# Patient Record
Sex: Female | Born: 1945 | Race: White | Hispanic: No | Smoking: Former smoker
Health system: Southern US, Community
[De-identification: ages and names within clinical notes are randomized; demographics above are authoritative.]

## PROBLEM LIST (undated history)

## (undated) DIAGNOSIS — M1990 Unspecified osteoarthritis, unspecified site: Secondary | ICD-10-CM

## (undated) DIAGNOSIS — K219 Gastro-esophageal reflux disease without esophagitis: Secondary | ICD-10-CM

## (undated) DIAGNOSIS — G500 Trigeminal neuralgia: Secondary | ICD-10-CM

## (undated) DIAGNOSIS — H269 Unspecified cataract: Secondary | ICD-10-CM

## (undated) DIAGNOSIS — I10 Essential (primary) hypertension: Secondary | ICD-10-CM

## (undated) DIAGNOSIS — H409 Unspecified glaucoma: Secondary | ICD-10-CM

## (undated) DIAGNOSIS — G709 Myoneural disorder, unspecified: Secondary | ICD-10-CM

## (undated) DIAGNOSIS — G35 Multiple sclerosis: Secondary | ICD-10-CM

## (undated) DIAGNOSIS — E785 Hyperlipidemia, unspecified: Secondary | ICD-10-CM

## (undated) DIAGNOSIS — Z789 Other specified health status: Secondary | ICD-10-CM

## (undated) DIAGNOSIS — C801 Malignant (primary) neoplasm, unspecified: Secondary | ICD-10-CM

## (undated) HISTORY — DX: Other specified health status: Z78.9

## (undated) HISTORY — PX: OTHER SURGICAL HISTORY: SHX169

## (undated) HISTORY — DX: Gastro-esophageal reflux disease without esophagitis: K21.9

## (undated) HISTORY — DX: Multiple sclerosis: G35

## (undated) HISTORY — PX: BREAST LUMPECTOMY: SHX2

## (undated) HISTORY — DX: Myoneural disorder, unspecified: G70.9

## (undated) HISTORY — DX: Unspecified osteoarthritis, unspecified site: M19.90

## (undated) HISTORY — DX: Malignant (primary) neoplasm, unspecified: C80.1

## (undated) HISTORY — PX: POLYPECTOMY: SHX149

## (undated) HISTORY — PX: PARTIAL HYSTERECTOMY: SHX80

## (undated) HISTORY — DX: Essential (primary) hypertension: I10

## (undated) HISTORY — DX: Hyperlipidemia, unspecified: E78.5

## (undated) HISTORY — DX: Unspecified glaucoma: H40.9

## (undated) HISTORY — DX: Unspecified cataract: H26.9

---

## 2000-01-26 ENCOUNTER — Encounter: Payer: Self-pay | Admitting: Emergency Medicine

## 2000-01-26 ENCOUNTER — Emergency Department (HOSPITAL_COMMUNITY): Admission: EM | Admit: 2000-01-26 | Discharge: 2000-01-27 | Payer: Self-pay | Admitting: *Deleted

## 2000-04-04 ENCOUNTER — Ambulatory Visit (HOSPITAL_COMMUNITY): Admission: RE | Admit: 2000-04-04 | Discharge: 2000-04-04 | Payer: Self-pay | Admitting: Neurology

## 2000-10-27 ENCOUNTER — Encounter: Admission: RE | Admit: 2000-10-27 | Discharge: 2000-11-04 | Payer: Self-pay | Admitting: Psychiatry

## 2003-11-04 ENCOUNTER — Encounter: Admission: RE | Admit: 2003-11-04 | Discharge: 2003-11-04 | Payer: Self-pay | Admitting: Family Medicine

## 2010-04-07 ENCOUNTER — Encounter: Payer: Self-pay | Admitting: Family Medicine

## 2010-12-20 HISTORY — PX: COLONOSCOPY: SHX174

## 2013-09-12 DIAGNOSIS — G35 Multiple sclerosis: Secondary | ICD-10-CM | POA: Insufficient documentation

## 2013-09-12 DIAGNOSIS — M810 Age-related osteoporosis without current pathological fracture: Secondary | ICD-10-CM | POA: Insufficient documentation

## 2013-09-12 DIAGNOSIS — M79609 Pain in unspecified limb: Secondary | ICD-10-CM | POA: Insufficient documentation

## 2013-09-12 DIAGNOSIS — F419 Anxiety disorder, unspecified: Secondary | ICD-10-CM | POA: Insufficient documentation

## 2013-09-12 DIAGNOSIS — M545 Low back pain, unspecified: Secondary | ICD-10-CM | POA: Insufficient documentation

## 2013-09-12 DIAGNOSIS — E871 Hypo-osmolality and hyponatremia: Secondary | ICD-10-CM | POA: Insufficient documentation

## 2013-09-12 DIAGNOSIS — G500 Trigeminal neuralgia: Secondary | ICD-10-CM | POA: Insufficient documentation

## 2013-09-12 DIAGNOSIS — D539 Nutritional anemia, unspecified: Secondary | ICD-10-CM

## 2013-09-12 DIAGNOSIS — E7800 Pure hypercholesterolemia, unspecified: Secondary | ICD-10-CM | POA: Insufficient documentation

## 2013-09-12 DIAGNOSIS — R51 Headache: Secondary | ICD-10-CM

## 2013-09-12 DIAGNOSIS — M26629 Arthralgia of temporomandibular joint, unspecified side: Secondary | ICD-10-CM | POA: Insufficient documentation

## 2013-09-12 DIAGNOSIS — R261 Paralytic gait: Secondary | ICD-10-CM | POA: Insufficient documentation

## 2013-09-12 DIAGNOSIS — M25519 Pain in unspecified shoulder: Secondary | ICD-10-CM | POA: Insufficient documentation

## 2013-09-12 DIAGNOSIS — R519 Headache, unspecified: Secondary | ICD-10-CM | POA: Insufficient documentation

## 2013-09-12 DIAGNOSIS — G4700 Insomnia, unspecified: Secondary | ICD-10-CM | POA: Insufficient documentation

## 2013-09-12 DIAGNOSIS — E559 Vitamin D deficiency, unspecified: Secondary | ICD-10-CM | POA: Insufficient documentation

## 2013-09-12 DIAGNOSIS — D759 Disease of blood and blood-forming organs, unspecified: Secondary | ICD-10-CM | POA: Insufficient documentation

## 2013-09-12 DIAGNOSIS — R5383 Other fatigue: Secondary | ICD-10-CM | POA: Insufficient documentation

## 2013-09-12 DIAGNOSIS — B0222 Postherpetic trigeminal neuralgia: Secondary | ICD-10-CM | POA: Insufficient documentation

## 2013-09-12 HISTORY — DX: Nutritional anemia, unspecified: D53.9

## 2013-12-13 ENCOUNTER — Ambulatory Visit (INDEPENDENT_AMBULATORY_CARE_PROVIDER_SITE_OTHER): Payer: Medicare Other | Admitting: Podiatry

## 2013-12-13 ENCOUNTER — Encounter: Payer: Self-pay | Admitting: Podiatry

## 2013-12-13 VITALS — BP 143/74 | HR 69 | Ht 63.0 in | Wt 146.0 lb

## 2013-12-13 DIAGNOSIS — L600 Ingrowing nail: Secondary | ICD-10-CM | POA: Insufficient documentation

## 2013-12-13 DIAGNOSIS — M79604 Pain in right leg: Secondary | ICD-10-CM

## 2013-12-13 DIAGNOSIS — M79609 Pain in unspecified limb: Secondary | ICD-10-CM

## 2013-12-13 DIAGNOSIS — B351 Tinea unguium: Secondary | ICD-10-CM | POA: Insufficient documentation

## 2013-12-13 DIAGNOSIS — M79606 Pain in leg, unspecified: Secondary | ICD-10-CM | POA: Insufficient documentation

## 2013-12-13 NOTE — Patient Instructions (Signed)
Seen for painful nail. Noted of ingrown nail on right great toe.  Debrided painful nail. Return in 3 months.

## 2013-12-13 NOTE — Progress Notes (Signed)
Subjective: 68 year old female presents complaining of painful nail while wearing closed in shoes. Stated that right toe nail came off and grew back ingrown now.  Has had ingrown nail surgery years back on both great toe nails.   Review of Systems - General ROS: negative  Objective: Neurovascular status are within normal. Ingrown nail both great toes R>L. Mild bunion L>R.  Assessment: Painful ingrown nail R>L.  Plan: Reviewed clinical findings  Debrided all nails. Return as needed.

## 2014-03-16 ENCOUNTER — Ambulatory Visit: Payer: Medicare Other | Admitting: Podiatry

## 2014-04-14 ENCOUNTER — Ambulatory Visit: Payer: Self-pay | Admitting: Neurology

## 2014-05-16 ENCOUNTER — Encounter: Payer: Self-pay | Admitting: Neurology

## 2014-05-16 ENCOUNTER — Ambulatory Visit (INDEPENDENT_AMBULATORY_CARE_PROVIDER_SITE_OTHER): Payer: Medicare Other | Admitting: Neurology

## 2014-05-16 VITALS — BP 148/94 | HR 68 | Resp 14 | Ht 63.0 in | Wt 146.6 lb

## 2014-05-16 DIAGNOSIS — R5383 Other fatigue: Secondary | ICD-10-CM | POA: Diagnosis not present

## 2014-05-16 DIAGNOSIS — R261 Paralytic gait: Secondary | ICD-10-CM | POA: Diagnosis not present

## 2014-05-16 DIAGNOSIS — F418 Other specified anxiety disorders: Secondary | ICD-10-CM | POA: Diagnosis not present

## 2014-05-16 DIAGNOSIS — G500 Trigeminal neuralgia: Secondary | ICD-10-CM | POA: Insufficient documentation

## 2014-05-16 DIAGNOSIS — G35 Multiple sclerosis: Secondary | ICD-10-CM | POA: Diagnosis not present

## 2014-05-16 DIAGNOSIS — G35D Multiple sclerosis, unspecified: Secondary | ICD-10-CM

## 2014-05-16 MED ORDER — CARBAMAZEPINE 200 MG PO TABS: 200.0000 mg | ORAL_TABLET | Freq: Four times a day (QID) | ORAL | Status: DC

## 2014-05-16 MED ORDER — OXYCODONE-ACETAMINOPHEN 10-325 MG PO TABS: ORAL_TABLET | ORAL | Status: DC

## 2014-05-16 MED ORDER — LAMOTRIGINE 100 MG PO TABS: 100.0000 mg | ORAL_TABLET | Freq: Three times a day (TID) | ORAL | Status: DC

## 2014-05-16 MED ORDER — HYDROCODONE-ACETAMINOPHEN 5-325 MG PO TABS: 1.0000 | ORAL_TABLET | Freq: Four times a day (QID) | ORAL | Status: DC | PRN

## 2014-05-16 MED ORDER — INTERFERON BETA-1A 22 MCG/0.5ML ~~LOC~~ SOSY: 22.0000 ug | PREFILLED_SYRINGE | SUBCUTANEOUS | Status: DC

## 2014-05-16 NOTE — Progress Notes (Signed)
[facility]  PATIENT: Crystal Huber DOB: Nov 25, 1945  REFERRING DOCTOR OR PCP:  [place]: Patient  _________________________________   HISTORICAL  CHIEF COMPLAINT:  Chief Complaint  Patient presents with  . Multiple Sclerosis    Sts. she tolerates Rebif well.  Denies new or worsening sx.   . Trigeminal Neuralgia    Sts. left sided facial pain well controlled with Tegretol qid, Lamictal qhs/fim    HISTORY OF PRESENT ILLNESS:   Crystal Huber is a 69 year old woman who was diagnosed with multiple sclerosis in 2002 after presenting with left trigeminal neuralgia. She was initially referred to Dr. Jacolyn Reedy and had MRI and LP consistent with the diagnosis of MS.   She then saw her one of the doctors at [facility] neurology and [facility] She was started on Rebif. For second opinion she went to Dr. Jacqulynn Cadet in [place] and continue to see him for several years. A few years ago, after he moved , she started to see me in [facility]. She is now transferring care to [facility].   She is not sure when her last MRI was but believes a couple years ago.       Her main symptoms with MS continues to be left trigeminal neuralgia.   She is on Carbamezapine (3-4 times a day), Lamictal (bid) and baclofen with benefit.   She occasional uses hydrocodone and very rarely used oxycodone (a few times a year).       A couple times a year, she will have several days where she is having much more pain. She denies any numbness but has some allodynia in the left face. The distribution is in the V2 neurotome.  She denies any numbness or weakness in her legs. Sometimes she will feel a little unsteady, especially when she gets out of bed at night. She has not fallen recently.  She notes a little bit of bladder urgency at times and will have rare stress incontinence. She has not noted hesitancy. She denies any recent urinary tract infections.  She denies any visual  problems.  She notes fatigue that often worsens as the day goes on. This is both physical and cognitive. She denies any sleepiness. She falls asleep easily and we'll wake up usually twice a night to use the bathroom. However, she quickly falls back asleep.   She notes some more stress recently with her husband's illness. She denies any significant depression or anxiety here she tolerates citalopram well. She will have a mild or anxiety.   She notes mild cognitive dysfunction at times. Specifically she has noted some verbal fluency issues and also has mild short-term memory issues at times. She finds it harder to concentrate for longer periods at times and notes that she may not remember early part of the movie by the time she is watching the second half of the movie.  REVIEW OF SYSTEMS: Constitutional: No fevers, chills, sweats, or change in appetite.   Fatigue Eyes: No visual changes, double vision, eye pain Ear, nose and throat: No hearing loss, ear pain, nasal congestion, sore throat Cardiovascular: No chest pain, palpitations Respiratory: No shortness of breath at rest or with exertion.   No wheezes GastrointestinaI: No nausea, vomiting, diarrhea, abdominal pain, fecal incontinence Genitourinary: No dysuria, urinary retention or frequency.  No nocturia. Musculoskeletal: No neck pain, back pain Integumentary: No rash, pruritus, skin lesions Neurological: as above Psychiatric: No depression.  Mild anxiety Endocrine: No palpitations, diaphoresis, change in appetite, change in  weigh or increased thirst Hematologic/Lymphatic: No anemia, purpura, petechiae..   Bruises easily Allergic/Immunologic: No itchy/runny eyes, nasal congestion, recent allergic reactions, rashes  ALLERGIES: Allergies  Allergen Reactions  . Metronidazole Nausea Only  . Penicillins Swelling    Yeast infection  . Sulfa Antibiotics Swelling    yeast infection    HOME MEDICATIONS:  Current outpatient  prescriptions:  .  baclofen (LIORESAL) 10 MG tablet, Take 10 mg by mouth., Disp: , Rfl:  .  calcium-vitamin D (CALCIUM 500/D) 500-200 MG-UNIT per tablet, Take by mouth., Disp: , Rfl:  .  carbamazepine (TEGRETOL) 200 MG tablet, Take 200 mg by mouth., Disp: , Rfl:  .  citalopram (CELEXA) 20 MG tablet, Take 1 tablet daily, Disp: , Rfl:  .  HYDROcodone-acetaminophen (NORCO/VICODIN) 5-325 MG per tablet, Take by mouth., Disp: , Rfl:  .  hydrocortisone 2.5 % cream, Apply topically., Disp: , Rfl:  .  interferon beta-1a (REBIF) 22 MCG/0.5ML injection, , Disp: , Rfl:  .  lamoTRIgine (LAMICTAL) 100 MG tablet, , Disp: , Rfl:  .  meloxicam (MOBIC) 15 MG tablet, Take 15 mg by mouth., Disp: , Rfl:  .  olmesartan (BENICAR) 20 MG tablet, Take 20 mg by mouth., Disp: , Rfl:  .  vitamin B-12 (CYANOCOBALAMIN) 100 MCG tablet, Place under the tongue., Disp: , Rfl:   PAST MEDICAL HISTORY: Past Medical History  Diagnosis Date  . Multiple sclerosis   . Hypertension   . Glaucoma     PAST SURGICAL HISTORY: Past Surgical History  Procedure Laterality Date  . Partial hysterectomy      FAMILY HISTORY: Family History  Problem Relation Age of Onset  . Congestive Heart Failure Mother   . Heart attack Father   . Stroke Sister   . Heart disease Brother   . Heart disease Sister   . Heart disease Brother   . Heart disease Brother     SOCIAL HISTORY:  History   Social History  . Marital Status: Married    Spouse Name: N/A  . Number of Children: N/A  . Years of Education: N/A   Occupational History  . Not on file.   Social History Main Topics  . Smoking status: Former Research scientist (life sciences)  . Smokeless tobacco: Never Used  . Alcohol Use: 0.0 oz/week    0 Standard drinks or equivalent per week     Comment: 1-2 glasses of wine at night/fim  . Drug Use: No  . Sexual Activity: Not on file   Other Topics Concern  . Not on file   Social History Narrative     PHYSICAL EXAM  Filed Vitals:   05/16/14 0902   BP: 148/94  Pulse: 68  Resp: 14  Height: 5\' 3"  (1.6 m)  Weight: 146 lb 9.6 oz (66.497 kg)    Body mass index is 25.98 kg/(m^2).   General: The patient is well-developed and well-nourished and in no acute distress  Eyes:  Funduscopic exam shows normal optic discs and retinal vessels.  Neck: The neck is supple, no carotid bruits are noted.  The neck is nontender.  Cardiovascular: The heart has a regular rate and rhythm with a normal S1 and S2. There were no murmurs, gallops or rubs. Lungs are clear to auscultation.  Skin: Extremities are without significant edema.  Musculoskeletal:  Back is nontender  Neurologic Exam  Mental status: The patient is alert and oriented x 3 at the time of the examination. The patient has apparent normal recent and remote memory, with an  apparently normal attention span and concentration ability.   Speech is normal.  Cranial nerves: Extraocular movements are full. Pupils are equal, round, and reactive to light and accomodation.  Visual fields are full.  Colors are brighter out of left eye than right.  Facial symmetry is present. There is good facial sensation to soft touch bilaterally.Facial strength is normal.  Trapezius and sternocleidomastoid strength is normal. No dysarthria is noted.  The tongue is midline, and the patient has symmetric elevation of the soft palate. No obvious hearing deficits are noted.  Motor:  Muscle bulk is normal.   Tone is normal. Strength is  5 / 5 in all 4 extremities.   Sensory: Sensory testing is intact to pinprick, soft touch and vibration sensation in all 4 extremities.  Coordination: Cerebellar testing reveals good finger-nose-finger and heel-to-shin bilaterally.  Gait and station: Station is normal.   Gait is normal. Tandem gait is wide. Romberg is negative.   Reflexes: Deep tendon reflexes are symmetric and normal bilaterally.   Plantar responses are flexor.    DIAGNOSTIC DATA (LABS, IMAGING, TESTING) - I  reviewed patient records, labs, notes, testing and imaging myself where available.    ASSESSMENT AND PLAN  Multiple sclerosis - Plan: MR Brain W Wo Contrast, CBC with Differential/Platelet, CMP, Carbamazepine level, total  Trigeminal neuralgia - Plan: MR Brain W Wo Contrast, CBC with Differential/Platelet, CMP, Carbamazepine level, total  Other fatigue  Other specified anxiety disorders  Jerking gait    In summary, Penda Venturi is a 69 year old woman with relapsing remitting multiple sclerosis who has been stable on Rebif. Her current  Problems include left trigeminal neuralgia, mild gait dysfunction and fatigue. I will check blood work to make sure she is not experiencing any  hematologic or hepatic side effect of her therapy and I will also check a Tegretol level. In order to assess for possible subclinical progression of her MS and we will obtain an MRI of the brain with and without contrast. She requested an open magnet. If she does have progression we will need to consider some of the other therapies and I reviewed some of the other options.   She will return to see me in 6 months or sooner if she has new or worsening neurologic issues.        45 minute face-to-face evaluation with greater than 50% of the time counseling or coordinating care about her neurologic symptoms and multiple sclerosis.   Aldred Mase A. Felecia Shelling, MD, PhD 8/65/7846, 9:62 AM Certified in Neurology, Clinical Neurophysiology, Sleep Medicine, Pain Medicine and Neuroimaging  [facility] [address], [place], [place] [address] [phone]

## 2014-05-16 NOTE — Addendum Note (Signed)
Addended by: Arlice Colt A on: 05/16/2014 10:14 AM   Modules accepted: Orders

## 2014-05-16 NOTE — Patient Instructions (Signed)

## 2014-05-17 ENCOUNTER — Telehealth: Payer: Self-pay | Admitting: *Deleted

## 2014-05-17 ENCOUNTER — Telehealth: Payer: Self-pay | Admitting: Neurology

## 2014-05-17 LAB — CBC WITH DIFFERENTIAL/PLATELET
BASOS ABS: 0 10*3/uL (ref 0.0–0.2)
Basos: 1 %
EOS ABS: 0 10*3/uL (ref 0.0–0.4)
Eos: 1 %
HCT: 40.8 % (ref 34.0–46.6)
HEMOGLOBIN: 13.3 g/dL (ref 11.1–15.9)
IMMATURE GRANS (ABS): 0 10*3/uL (ref 0.0–0.1)
Immature Granulocytes: 0 %
Lymphocytes Absolute: 0.9 10*3/uL (ref 0.7–3.1)
Lymphs: 22 %
MCH: 32.9 pg (ref 26.6–33.0)
MCHC: 32.6 g/dL (ref 31.5–35.7)
MCV: 101 fL — ABNORMAL HIGH (ref 79–97)
MONOCYTES: 16 %
Monocytes Absolute: 0.7 10*3/uL (ref 0.1–0.9)
NEUTROS ABS: 2.5 10*3/uL (ref 1.4–7.0)
NEUTROS PCT: 60 %
PLATELETS: 268 10*3/uL (ref 150–379)
RBC: 4.04 x10E6/uL (ref 3.77–5.28)
RDW: 13.1 % (ref 12.3–15.4)
WBC: 4.2 10*3/uL (ref 3.4–10.8)

## 2014-05-17 LAB — COMPREHENSIVE METABOLIC PANEL
A/G RATIO: 1.7 (ref 1.1–2.5)
ALK PHOS: 111 IU/L (ref 39–117)
ALT: 19 IU/L (ref 0–32)
AST: 23 IU/L (ref 0–40)
Albumin: 4.2 g/dL (ref 3.6–4.8)
BUN/Creatinine Ratio: 30 — ABNORMAL HIGH (ref 11–26)
BUN: 17 mg/dL (ref 8–27)
Bilirubin Total: 0.2 mg/dL (ref 0.0–1.2)
CO2: 25 mmol/L (ref 18–29)
Calcium: 9.1 mg/dL (ref 8.7–10.3)
Chloride: 92 mmol/L — ABNORMAL LOW (ref 97–108)
Creatinine, Ser: 0.57 mg/dL (ref 0.57–1.00)
GFR calc non Af Amer: 96 mL/min/{1.73_m2} (ref >59)
GFR, EST AFRICAN AMERICAN: 110 mL/min/{1.73_m2} (ref >59)
Globulin, Total: 2.5 g/dL (ref 1.5–4.5)
Glucose: 96 mg/dL (ref 65–99)
POTASSIUM: 5.4 mmol/L — AB (ref 3.5–5.2)
Sodium: 133 mmol/L — ABNORMAL LOW (ref 134–144)
Total Protein: 6.7 g/dL (ref 6.0–8.5)

## 2014-05-17 LAB — CARBAMAZEPINE LEVEL, TOTAL: Carbamazepine Lvl: 9.1 ug/mL (ref 4.0–12.0)

## 2014-05-17 NOTE — Telephone Encounter (Signed)
LMOM (identified vm) that per RAS, labs are ok, so continue current dose of Tegretol, and call me if she has any questions/fim

## 2014-05-17 NOTE — Telephone Encounter (Signed)
Patient has questions regarding procedure for Trigeminal Neuralgia.  Please call and advise.

## 2014-05-17 NOTE — Telephone Encounter (Signed)
-----   Message from [place]. Felecia Shelling, MD sent at 05/17/2014  4:25 PM EST ----- Labs are ok    Tegretol level is good so continue curent dose

## 2014-05-18 NOTE — Telephone Encounter (Signed)
Spoke with Crystal Huber--she has questions about a procedure for trigeminal neuralgia that she discussed with RAS at last ov.  I will ask RAS about this and call her back/fim

## 2014-05-18 NOTE — Telephone Encounter (Signed)
Spoke with Crystal Huber and advised I have spoken with Crystal Huber--the procedure he discussed with her was a sphenocath.  I described procedure to Crystal Huber and answered her questions.  She verbalized understanding of same/fim

## 2014-05-24 DIAGNOSIS — G35 Multiple sclerosis: Secondary | ICD-10-CM | POA: Diagnosis not present

## 2014-05-25 ENCOUNTER — Other Ambulatory Visit: Payer: Self-pay | Admitting: Neurology

## 2014-05-25 DIAGNOSIS — G500 Trigeminal neuralgia: Secondary | ICD-10-CM

## 2014-05-25 DIAGNOSIS — G35 Multiple sclerosis: Secondary | ICD-10-CM

## 2014-05-31 ENCOUNTER — Telehealth: Payer: Self-pay | Admitting: *Deleted

## 2014-05-31 NOTE — Telephone Encounter (Signed)
-----   Message from Britt Bottom, MD sent at 05/30/2014  5:41 PM EDT ----- I compared side by side to 2012 MRI  --- only 1 new spot in > 3 years is pretty good

## 2014-05-31 NOTE — Telephone Encounter (Signed)
Spoke with Crystal Huber and per RAS, advised that recent mri shows only one new spot since 2012; she should continue meds as rx'd.  She verbalized understanding of same/fim

## 2014-07-11 ENCOUNTER — Telehealth: Payer: Self-pay | Admitting: Neurology

## 2014-07-11 NOTE — Telephone Encounter (Signed)
Called pt to schedule her husband for a consult and she said she needs to talk to you regarding her Trigeminal Neuralgia.

## 2014-07-14 ENCOUNTER — Encounter: Payer: Self-pay | Admitting: Neurology

## 2014-07-14 ENCOUNTER — Ambulatory Visit (INDEPENDENT_AMBULATORY_CARE_PROVIDER_SITE_OTHER): Payer: Medicare Other | Admitting: Neurology

## 2014-07-14 VITALS — BP 166/102 | HR 64 | Resp 14 | Ht 63.0 in | Wt 141.4 lb

## 2014-07-14 DIAGNOSIS — G35 Multiple sclerosis: Secondary | ICD-10-CM

## 2014-07-14 DIAGNOSIS — F413 Other mixed anxiety disorders: Secondary | ICD-10-CM | POA: Diagnosis not present

## 2014-07-14 DIAGNOSIS — G500 Trigeminal neuralgia: Secondary | ICD-10-CM | POA: Diagnosis not present

## 2014-07-14 DIAGNOSIS — R350 Frequency of micturition: Secondary | ICD-10-CM | POA: Diagnosis not present

## 2014-07-14 DIAGNOSIS — G44099 Other trigeminal autonomic cephalgias (TAC), not intractable: Secondary | ICD-10-CM

## 2014-07-14 DIAGNOSIS — R5383 Other fatigue: Secondary | ICD-10-CM | POA: Diagnosis not present

## 2014-07-14 MED ORDER — LAMOTRIGINE 150 MG PO TABS: 150.0000 mg | ORAL_TABLET | Freq: Three times a day (TID) | ORAL | Status: DC

## 2014-07-14 MED ORDER — TAMSULOSIN HCL 0.4 MG PO CAPS: 0.4000 mg | ORAL_CAPSULE | Freq: Every day | ORAL | Status: DC

## 2014-07-14 NOTE — Progress Notes (Signed)
[facility]  PATIENT: Crystal Huber DOB: 01/10/1946  REFERRING DOCTOR OR PCP:  [place]: Patient  _________________________________   HISTORICAL  CHIEF COMPLAINT:  Chief Complaint  Patient presents with  . Multiple Sclerosis    Sts. she tolerates Rebif well.  Sts. she had frequent episodes of dizziness--episodes are daily right now, lasting from minutes to hrs. Sts. pcp has told her this is due to inner ear problems, and rx'd Meclizine, which she isn't sure helps.  She is also having more difficulty starting urine stream./fim  . Trigeminal Neuralgia    Sts. left sided facial pain is worse over the last 3 weeks.  She is taking Hydrocodone, Carbamazepine, and Lamictal more often than rx'd--every 3-4 hrs./fim    HISTORY OF PRESENT ILLNESS:  Crystal Huber is a 69 year old woman who was diagnosed with multiple sclerosis in 2002 after presenting with left trigeminal neuralgia.   Her main symptoms with MS continues to be left trigeminal neuralgia.   Pain intensified the last 3 weeks.   She is taking 4-5  200 mg tegretols and 4-5 100 mg Lamictal and baclofen 10 mg nightly (too sleepy if taken during the day).   She takes hydrocodone 5/325 twice a day.   The distribution is in the V2 neurotome. Since going up on her medications, she has felt more dizzy.  Meclizine does not help the dizziness.  Gait/Strength/sensation:   She denies any numbness or weakness in her legs. Sometimes she will feel a little unsteady, especially when she gets out of bed at night. She has not fallen recently.  Bladder:  She notes a little bit of bladder urgency at times and will have rare stress incontinence. She has not noted hesitancy. She denies any recent urinary tract infections.  Vision:   She denies any visual problems.  Fatigue:  She notes fatigue that often worsens as the day goes on. This is both physical and cognitive. She falls asleep easily and will wake up usually twice a  night to use the bathroom. However, she quickly falls back asleep.   Mood/cogniion:   She notes some anxiety and stress recently. She denies any significant depression or anxiety here she tolerates citalopram well. She will have a mild or anxiety. She notes mild cognitive dysfunction and has noted some verbal fluency issues and also has mild short-term memory issues at times.   MS History:   She had MRI and LP consistent with the diagnosis of MS.   She was started on Rebif. For second opinion she went to Dr. Jacqulynn Cadet in [place] and continue to see him for several years. A few years ago, after he moved , she started to see me in [facility]. She is now transferring care to [facility].   She is on Rebif 22 mcg, tolerates it well and has had no definite exacerbation.  Last MRi was 2-3 years ago.  REVIEW OF SYSTEMS: Constitutional: No fevers, chills, sweats, or change in appetite.   Fatigue Eyes: No visual changes, double vision, eye pain Ear, nose and throat: No hearing loss, ear pain, nasal congestion, sore throat Cardiovascular: No chest pain, palpitations Respiratory: No shortness of breath at rest or with exertion.   No wheezes GastrointestinaI: No nausea, vomiting, diarrhea, abdominal pain, fecal incontinence Genitourinary: No dysuria, urinary retention or frequency.  No nocturia. Musculoskeletal: No neck pain, back pain Integumentary: No rash, pruritus, skin lesions Neurological: as above Psychiatric: Some depression and  Mild anxiety Endocrine: No palpitations, diaphoresis,  change in appetite, change in weigh or increased thirst Hematologic/Lymphatic: No anemia, purpura, petechiae..   Bruises easily Allergic/Immunologic: No itchy/runny eyes, nasal congestion, recent allergic reactions, rashes  ALLERGIES: Allergies  Allergen Reactions  . Metronidazole Nausea Only  . Penicillins Swelling    Yeast infection  . Sulfa Antibiotics Swelling    yeast infection    HOME  MEDICATIONS:  Current outpatient prescriptions:  .  baclofen (LIORESAL) 10 MG tablet, Take 10 mg by mouth., Disp: , Rfl:  .  calcium-vitamin D (CALCIUM 500/D) 500-200 MG-UNIT per tablet, Take by mouth., Disp: , Rfl:  .  carbamazepine (TEGRETOL) 200 MG tablet, Take 1 tablet (200 mg total) by mouth 4 (four) times daily., Disp: 120 tablet, Rfl: 12 .  citalopram (CELEXA) 20 MG tablet, Take 1 tablet daily, Disp: , Rfl:  .  HYDROcodone-acetaminophen (NORCO/VICODIN) 5-325 MG per tablet, Take 1 tablet by mouth every 6 (six) hours as needed for moderate pain., Disp: 30 tablet, Rfl: 0 .  hydrocortisone 2.5 % cream, Apply topically., Disp: , Rfl:  .  Interferon Beta-1a (REBIF) 22 MCG/0.5ML SOSY, Inject 22 mcg into the skin 3 (three) times a week., Disp: 12 Syringe, Rfl: 12 .  lamoTRIgine (LAMICTAL) 100 MG tablet, Take 1 tablet (100 mg total) by mouth 3 (three) times daily., Disp: 90 tablet, Rfl: 12 .  meclizine (ANTIVERT) 12.5 MG tablet, Take 12.5 mg by mouth., Disp: , Rfl:  .  meloxicam (MOBIC) 15 MG tablet, Take 15 mg by mouth., Disp: , Rfl:  .  olmesartan (BENICAR) 20 MG tablet, Take 20 mg by mouth., Disp: , Rfl:  .  oxyCODONE-acetaminophen (PERCOCET) 10-325 MG per tablet, One po q 8 hours as needed, Disp: 15 tablet, Rfl: 0 .  vitamin B-12 (CYANOCOBALAMIN) 100 MCG tablet, Place under the tongue., Disp: , Rfl:   PAST MEDICAL HISTORY: Past Medical History  Diagnosis Date  . Multiple sclerosis   . Hypertension   . Glaucoma     PAST SURGICAL HISTORY: Past Surgical History  Procedure Laterality Date  . Partial hysterectomy      FAMILY HISTORY: Family History  Problem Relation Age of Onset  . Congestive Heart Failure Mother   . Heart attack Father   . Stroke Sister   . Heart disease Brother   . Heart disease Sister   . Heart disease Brother   . Heart disease Brother     SOCIAL HISTORY:  History   Social History  . Marital Status: Married    Spouse Name: N/A  . Number of Children:  N/A  . Years of Education: N/A   Occupational History  . Not on file.   Social History Main Topics  . Smoking status: Former Research scientist (life sciences)  . Smokeless tobacco: Never Used  . Alcohol Use: 0.0 oz/week    0 Standard drinks or equivalent per week     Comment: 1-2 glasses of wine at night/fim  . Drug Use: No  . Sexual Activity: Not on file   Other Topics Concern  . Not on file   Social History Narrative     PHYSICAL EXAM  Filed Vitals:   07/14/14 1030  BP: 166/102  Pulse: 64  Resp: 14  Height: 5\' 3"  (1.6 m)  Weight: 141 lb 6.4 oz (64.139 kg)    Body mass index is 25.05 kg/(m^2).   General: The patient is well-developed and well-nourished and in no acute distress  Neck: The neck is supple.  The neck is nontender.   Neurologic Exam  Mental status: The patient is alert and oriented x 3 at the time of the examination. The patient has apparent normal recent and remote memory, with an apparently normal attention span and concentration ability.   Speech is normal.  Cranial nerves: Extraocular movements are full.   Facial symmetry is present. There is good facial sensation to soft touch bilaterally.Facial strength is normal.  Trapezius and sternocleidomastoid strength is normal. No dysarthria is noted.  The tongue is midline, and the patient has symmetric elevation of the soft palate. No obvious hearing deficits are noted.  Motor:  Muscle bulk is normal.   Tone is normal. Strength is  5 / 5 in all 4 extremities.   Sensory: Sensory testing is intact to touch.  Coordination: Cerebellar testing reveals good finger-nose-finger .  Gait and station: Station is normal.   Gait is normal. Tandem gait is wide.    Reflexes: Deep tendon reflexes are symmetric and normal bilaterally.       DIAGNOSTIC DATA (LABS, IMAGING, TESTING) - I reviewed patient records, labs, notes, testing and imaging myself where available.   PROCEDURE    SPHENOCATH PROCEDURE NOTE  History: Trigeminal  Neuralgia  Procedure: The patient was placed in the supine position. A temperature strip was added to the cheek area after the area was cleaned with alcohol. The Sphenocath was lubricated with gel, and placed in the right naris. The catheter was inserted above the middle turbinate to the posterior nasal cavity, and then withdrawn 1 cm. The catheter was deployed and rotated approximately 20 towards the nose. 2-1/2 mL of 2% lidocaine was deployed. The patient was asked to swallow during the injection. The patient demonstrated erythema of the sclera of the eye on this side, and an increase in the cheek temperature was noted from 95 F to 97 F.  This process was repeated on the left side, with similar results. The increase in cheek temperature was documented from 95 F to 57 F.  The patient tolerated the procedure well. No complications of the procedure were noted. The patient was kept in the supine position for several minutes following the procedure. She was given small sips of water after sitting up following the procedure.  Lidocaine 2% NDC [phone]     ASSESSMENT AND PLAN  Trigeminal neuralgia  Other trigeminal autonomic cephalgia (TAC)  Multiple sclerosis  Urinary frequency  Other fatigue  Other mixed anxiety disorders   1.    Change lamotrigine from 100 to 150 mg po tid 2.    Continue tegretol but at tid in general, inc to qid if more pain 3.   Sphenocath procedure for her worsening trigeminal neuralgia - she tolerated it well and felt pain as better.    4.   Call on Monday to let us know if benefit was sustained.    Consider repeat procedure next week.  rtc 3 months, sooner if problems  Kesa Birky A. Felecia Shelling, MD, PhD 6/76/7209, 47:09 AM Certified in Neurology, Clinical Neurophysiology, Sleep Medicine, Pain Medicine and Neuroimaging  [facility] [address] [place], [place] [address] [phone]   11S

## 2014-07-16 ENCOUNTER — Telehealth: Payer: Self-pay | Admitting: Neurology

## 2014-07-16 NOTE — Telephone Encounter (Signed)
Pt called that she had sphenocath procedure on 07/14/14 and it helped that day and part of the second day. Since Friday pm, the pain returned and she has to take tegretol 200mg  Q4h qid and lamictal 150mg  Q4 qid. With these dosing, her pain is controlled and no dizziness so far. But she will call [name] Monday to see if she needs a walk-in visit.   I warned her that high dose tegretol can cause dizziness and she has to watch for it. Also her lamictal dose is increased rather fast so she has to watch for the rashes. She has low risk for rash though as she has been on lamictal for 2 years. She expressed understanding and appreciation. She will call in on Monday to speak with [name].  Rosalin Hawking, MD PhD Stroke Neurology 07/16/2014 10:24 AM

## 2014-07-17 NOTE — Telephone Encounter (Signed)
She got 2 days benefit from sphenocath -- we can try once more on Monday afternoon if she wants

## 2014-07-18 ENCOUNTER — Telehealth: Payer: Self-pay | Admitting: Neurology

## 2014-07-18 NOTE — Telephone Encounter (Signed)
I spoke with Crystal Huber and with Crystal Huber--they wanted to know what Crystal Huber can take for anxiety.  I reminded them that since he has not tolerated any meds for anxiety so far, that RAS increased his Citalopram to help with anxiety.  Crystal Huber verbalized understanding of same, will continue with Citalopram/fim

## 2014-07-18 NOTE — Telephone Encounter (Signed)
I spoke with Crystal Huber--gave appt. for sphenocath tomorrow at 1p/fim

## 2014-07-18 NOTE — Telephone Encounter (Signed)
Patient called and stated that [name] wanted her to call and give an update on her and her husbands medication changes. Her husband Crystal Huber started the medication last Tuesdays. Stated that she would give you more information when she speaks with you. Please call and advise.

## 2014-07-19 ENCOUNTER — Encounter: Payer: Self-pay | Admitting: Neurology

## 2014-07-19 ENCOUNTER — Ambulatory Visit (INDEPENDENT_AMBULATORY_CARE_PROVIDER_SITE_OTHER): Payer: Medicare Other | Admitting: Neurology

## 2014-07-19 VITALS — BP 164/96 | HR 78 | Resp 16 | Ht 63.0 in | Wt 141.0 lb

## 2014-07-19 DIAGNOSIS — G500 Trigeminal neuralgia: Secondary | ICD-10-CM | POA: Diagnosis not present

## 2014-07-19 NOTE — Progress Notes (Signed)
[facility]  PATIENT: Crystal Huber DOB: Jun 07, 1945  REFERRING DOCTOR OR PCP:  [place]: Patient  _________________________________   HISTORICAL  CHIEF COMPLAINT:  Chief Complaint  Patient presents with  . Trigeminal Neuralgia    Sts. she got 2 days of relief from sphenocath last week--would like to repeat./fim    HISTORY OF PRESENT ILLNESS:  Crystal Huber is a 69 year old woman who was diagnosed with multiple sclerosis in 2002 after presenting with left trigeminal neuralgia.   Her main issue is  left trigeminal neuralgia.      She is taking 4-5  200 mg tegretols and 4-5 100 mg Lamictal and baclofen 10 mg nightly (too sleepy if taken during the day).   She takes hydrocodone 5/325 twice a day.   The distribution is in the V2 neurotome. Since going up on her medications, she has felt more dizzy.  Meclizine does not help the dizziness.  Sphenocath last week helped tremendously x 2 days, then pain returned back to the same level.     ALLERGIES: Allergies  Allergen Reactions  . Metronidazole Nausea Only  . Penicillins Swelling    Yeast infection  . Sulfa Antibiotics Swelling    yeast infection    HOME MEDICATIONS:  Current outpatient prescriptions:  .  baclofen (LIORESAL) 10 MG tablet, Take 10 mg by mouth., Disp: , Rfl:  .  calcium-vitamin D (CALCIUM 500/D) 500-200 MG-UNIT per tablet, Take by mouth., Disp: , Rfl:  .  carbamazepine (TEGRETOL) 200 MG tablet, Take 1 tablet (200 mg total) by mouth 4 (four) times daily., Disp: 120 tablet, Rfl: 12 .  citalopram (CELEXA) 20 MG tablet, Take 1 tablet daily, Disp: , Rfl:  .  HYDROcodone-acetaminophen (NORCO/VICODIN) 5-325 MG per tablet, Take 1 tablet by mouth every 6 (six) hours as needed for moderate pain., Disp: 30 tablet, Rfl: 0 .  hydrocortisone 2.5 % cream, Apply topically., Disp: , Rfl:  .  Interferon Beta-1a (REBIF) 22 MCG/0.5ML SOSY, Inject 22 mcg into the skin 3 (three) times a week., Disp: 12  Syringe, Rfl: 12 .  lamoTRIgine (LAMICTAL) 150 MG tablet, Take 1 tablet (150 mg total) by mouth 3 (three) times daily., Disp: 90 tablet, Rfl: 11 .  meclizine (ANTIVERT) 12.5 MG tablet, Take 12.5 mg by mouth., Disp: , Rfl:  .  meloxicam (MOBIC) 15 MG tablet, Take 15 mg by mouth., Disp: , Rfl:  .  olmesartan (BENICAR) 20 MG tablet, Take 20 mg by mouth., Disp: , Rfl:  .  oxyCODONE-acetaminophen (PERCOCET) 10-325 MG per tablet, One po q 8 hours as needed, Disp: 15 tablet, Rfl: 0 .  tamsulosin (FLOMAX) 0.4 MG CAPS capsule, Take 1 capsule (0.4 mg total) by mouth daily., Disp: 30 capsule, Rfl: 11 .  vitamin B-12 (CYANOCOBALAMIN) 100 MCG tablet, Place under the tongue., Disp: , Rfl:   PAST MEDICAL HISTORY: Past Medical History  Diagnosis Date  . Multiple sclerosis   . Hypertension   . Glaucoma     PAST SURGICAL HISTORY: Past Surgical History  Procedure Laterality Date  . Partial hysterectomy      FAMILY HISTORY: Family History  Problem Relation Age of Onset  . Congestive Heart Failure Mother   . Heart attack Father   . Stroke Sister   . Heart disease Brother   . Heart disease Sister   . Heart disease Brother   . Heart disease Brother     SOCIAL HISTORY:  History   Social History  . Marital Status: Married  Spouse Name: N/A  . Number of Children: N/A  . Years of Education: N/A   Occupational History  . Not on file.   Social History Main Topics  . Smoking status: Former Research scientist (life sciences)  . Smokeless tobacco: Never Used  . Alcohol Use: 0.0 oz/week    0 Standard drinks or equivalent per week     Comment: 1-2 glasses of wine at night/fim  . Drug Use: No  . Sexual Activity: Not on file   Other Topics Concern  . Not on file   Social History Narrative     PHYSICAL EXAM  Filed Vitals:   07/19/14 1324  BP: 164/96  Pulse: 78  Resp: 16  Height: 5\' 3"  (1.6 m)  Weight: 141 lb (63.957 kg)    Body mass index is 24.98 kg/(m^2).   General: The patient is well-developed and  well-nourished and in no acute distress  Neck: The neck is supple.  The neck is nontender.  Neurologic Exam  Mental status: The patient is alert and oriented x 3 at the time of the examination. The patient has apparent normal recent and remote memory, with an apparently normal attention span and concentration ability.   Speech is normal.  Cranial nerves: Extraocular movements are full.   Facial symmetry is present. There is good facial sensation to soft touch bilaterally.Facial strength is normal.  Trapezius and sternocleidomastoid strength is normal. No dysarthria is noted.    Gait and station: Station is normal.   Gait is normal. Tandem gait is wide.      PROCEDURE    SPHENOCATH PROCEDURE NOTE  History: Trigeminal Neuralgia  Procedure: The patient was placed in the supine position. A temperature strip was added to the left cheek area after the area was cleaned with alcohol. The Sphenocath was lubricated with gel, and placed in the left naris. The catheter was inserted above the middle turbinate to the posterior nasal cavity, and then withdrawn 1 cm. The catheter was deployed and rotated approximately 20 towards the nose. 2-1/2 mL of 2% lidocaine was deployed. The patient was asked to swallow during the injection. The patient demonstrated erythema of the sclera of the eye on this side, and an increase in the cheek temperature was noted from 94 F to 96 F.  The patient tolerated the procedure well. No complications of the procedure were noted. The patient was kept in the supine position for several minutes following the procedure. She was given small sips of tea after sitting up following the procedure.  Lidocaine 2% NDC [phone]     ASSESSMENT AND PLAN  Trigeminal neuralgia    1.    Sphenocath procedure for her trigeminal neuralgia - she tolerated it well and felt pain as better.    2.    Continue tegretol and lamotrigine 3.   Call next Monday to let us know if benefit was  sustained.    If TN pain returns, consider referral to Pain Management for trigeminal neuralgia injwctions rtc 3 months, sooner if problems  Richard A. Felecia Shelling, MD, PhD 11/17/1192, 1:74 PM Certified in Neurology, Clinical Neurophysiology, Sleep Medicine, Pain Medicine and Neuroimaging  [facility] [address] [place], [place] [address] [phone]

## 2014-08-02 ENCOUNTER — Telehealth: Payer: Self-pay | Admitting: Neurology

## 2014-08-02 NOTE — Telephone Encounter (Signed)
Patient called requesting to speak with Crystal Huber regarding her experiencing dizziness for now reason and she would also like to discuss TMJ procedures. Please call and advise. Patient can be reached @ [phone]

## 2014-08-02 NOTE — Telephone Encounter (Signed)
Patient returned call. Please call and advise.  °

## 2014-08-02 NOTE — Telephone Encounter (Signed)
I have spoken with Crystal Huber this afternoon.  Per RAS, I advised that dizziness may be due to Tegretol, and recommended that she decrease from 4 to 3 tabs daily.  She verbalized understanding of same.  She sts. she has an appt. with Dr. Laurance Flatten at [facility] tomorrow as well to investigate another cause for intermittent dizziness.  She expresses concern that TN will be worse with decrease in Tegretol.  I have reinforced that RAS offered referral to Dr. Maryruth Eve for procedure to numb the tmj.  [name] verbalized understanding of same/fim

## 2014-08-02 NOTE — Telephone Encounter (Signed)
LMTC./fim 

## 2014-08-18 ENCOUNTER — Telehealth: Payer: Self-pay | Admitting: Neurology

## 2014-08-18 NOTE — Telephone Encounter (Signed)
Patient called requesting a refill for Interferon Beta-1a (REBIF) 22 MCG/0.5ML SOSY to [facility]. Patient can be reached at [phone].

## 2014-08-19 NOTE — Telephone Encounter (Signed)
It appears a 1 year Rx for this drug was sent to [facility] in Feb.  I called the pharmacy to clarify.  Spoke with April.  She reviewed file and verified the patient has several refills on file.  They think perhaps she was trying to refill an old Rx number.  I called the patient back.  She is aware.

## 2014-10-12 ENCOUNTER — Encounter: Payer: Self-pay | Admitting: Neurology

## 2014-10-12 ENCOUNTER — Ambulatory Visit (INDEPENDENT_AMBULATORY_CARE_PROVIDER_SITE_OTHER): Payer: Medicare Other | Admitting: Neurology

## 2014-10-12 VITALS — BP 160/80 | HR 72 | Resp 16 | Ht 63.0 in | Wt 139.0 lb

## 2014-10-12 DIAGNOSIS — R350 Frequency of micturition: Secondary | ICD-10-CM

## 2014-10-12 DIAGNOSIS — R5383 Other fatigue: Secondary | ICD-10-CM

## 2014-10-12 DIAGNOSIS — G500 Trigeminal neuralgia: Secondary | ICD-10-CM | POA: Diagnosis not present

## 2014-10-12 DIAGNOSIS — G35 Multiple sclerosis: Secondary | ICD-10-CM

## 2014-10-12 DIAGNOSIS — F419 Anxiety disorder, unspecified: Secondary | ICD-10-CM | POA: Diagnosis not present

## 2014-10-12 MED ORDER — INTERFERON BETA-1A 22 MCG/0.5ML ~~LOC~~ SOSY: 22.0000 ug | PREFILLED_SYRINGE | SUBCUTANEOUS | Status: DC

## 2014-10-12 NOTE — Progress Notes (Signed)
[facility]  PATIENT: Crystal Huber DOB: March 12, 1946  REFERRING DOCTOR OR PCP:  [place]: Patient  _________________________________   HISTORICAL  CHIEF COMPLAINT:  Chief Complaint  Patient presents with  . Multiple Sclerosis    Sts. she continues to tolerate Rebif well.  She needs a r/f of Rebif.  Sts. facial pain is stable.  Sts. dizziness has resolved/fim  . Trigeminal Neuralgia    HISTORY OF PRESENT ILLNESS:  Crystal Huber is a 69 year old woman who was diagnosed with multiple sclerosis in 2002 after presenting with left trigeminal neuralgia.   Trigeminal Neuralgia:  This is doing better currently after the Sphenocath procedure at her last visit and optimizing her medications.  She is taking 3 x  200 mg tegretols and 3 x 150 mg Lamictal and baclofen 10 mg nightly (too sleepy if taken during the day).  She is doing better than she had in years with the pain and is not dizzy on the lower dose of carbamazapine.     She is now not needing hydrocodone 5/325 (was twice a day).   The distribution is in the V2 neurotome.   Gait/Strength/sensation:  Gait is stable.   Sometimes she will feel a little unsteady, especially when she gets out of bed at night. She has not fallen recently.  She denies any numbness or weakness in her legs.   Bladder:  She notes a little bit of bladder urgency at times and will have rare stress incontinence. She has some hesitancy but decided against tamsulosin.  . She denies any recent urinary tract infections.  Vision:   She denies any visual problems.  Fatigue:  She notes some fatigue that often worsens as the day goes on.  This is better since reducing Tegretol.   She occasionally takes an energy drink. This is both physical and cognitive. She falls asleep easily and will wake up usually twice a night to use the bathroom. Her husband has Parkinson's and she needs to help him in/out of bed when he wakes up.  She quickly falls back  asleep.   Mood/cogniion:   She notes some anxiety and stress recently with husband's medical issues. She denies any significant depression .  Main problem is anxiety.   She thinks citalopram helps slightly. She notes mild cognitive dysfunction and has noted some verbal fluency issues and also has mild short-term memory issues at times.   MS History:   She had MRI and LP consistent with the diagnosis of MS in 2002 with trigeminal neuralgia.   She was started on Rebif.   She is on Rebif 22 mcg, tolerates it well and has had no definite exacerbation.  Last MRI was 2-3 years ago.  REVIEW OF SYSTEMS: Constitutional: No fevers, chills, sweats, or change in appetite.   Fatigue Eyes: No visual changes, double vision, eye pain Ear, nose and throat: No hearing loss, ear pain, nasal congestion, sore throat Cardiovascular: No chest pain, palpitations Respiratory: No shortness of breath at rest or with exertion.   No wheezes GastrointestinaI: No nausea, vomiting, diarrhea, abdominal pain, fecal incontinence Genitourinary: No dysuria, urinary retention or frequency.  No nocturia. Musculoskeletal: No neck pain, back pain Integumentary: No rash, pruritus, skin lesions Neurological: as above Psychiatric: Some depression and  Mild anxiety Endocrine: No palpitations, diaphoresis, change in appetite, change in weigh or increased thirst Hematologic/Lymphatic: No anemia, purpura, petechiae..   Bruises easily Allergic/Immunologic: No itchy/runny eyes, nasal congestion, recent allergic reactions, rashes  ALLERGIES: Allergies  Allergen Reactions  . Metronidazole Nausea Only  . Penicillins Swelling    Yeast infection  . Sulfa Antibiotics Swelling    yeast infection    HOME MEDICATIONS:  Current outpatient prescriptions:  .  baclofen (LIORESAL) 10 MG tablet, Take 10 mg by mouth., Disp: , Rfl:  .  calcium-vitamin D (CALCIUM 500/D) 500-200 MG-UNIT per tablet, Take by mouth., Disp: , Rfl:  .   carbamazepine (TEGRETOL) 200 MG tablet, Take 1 tablet (200 mg total) by mouth 4 (four) times daily., Disp: 120 tablet, Rfl: 12 .  citalopram (CELEXA) 20 MG tablet, Take 1 tablet daily, Disp: , Rfl:  .  hydrocortisone 2.5 % cream, Apply topically., Disp: , Rfl:  .  Interferon Beta-1a (REBIF) 22 MCG/0.5ML SOSY, Inject 22 mcg into the skin 3 (three) times a week., Disp: 12 Syringe, Rfl: 12 .  lamoTRIgine (LAMICTAL) 150 MG tablet, Take 1 tablet (150 mg total) by mouth 3 (three) times daily., Disp: 90 tablet, Rfl: 11 .  olmesartan (BENICAR) 20 MG tablet, Take 20 mg by mouth., Disp: , Rfl:  .  vitamin B-12 (CYANOCOBALAMIN) 100 MCG tablet, Place under the tongue., Disp: , Rfl:  .  HYDROcodone-acetaminophen (NORCO/VICODIN) 5-325 MG per tablet, Take 1 tablet by mouth every 6 (six) hours as needed for moderate pain. (Patient not taking: Reported on 10/12/2014), Disp: 30 tablet, Rfl: 0 .  meclizine (ANTIVERT) 12.5 MG tablet, Take 12.5 mg by mouth., Disp: , Rfl:  .  meloxicam (MOBIC) 15 MG tablet, Take 15 mg by mouth., Disp: , Rfl:  .  oxyCODONE-acetaminophen (PERCOCET) 10-325 MG per tablet, One po q 8 hours as needed (Patient not taking: Reported on 10/12/2014), Disp: 15 tablet, Rfl: 0 .  tamsulosin (FLOMAX) 0.4 MG CAPS capsule, Take 1 capsule (0.4 mg total) by mouth daily. (Patient not taking: Reported on 10/12/2014), Disp: 30 capsule, Rfl: 11  PAST MEDICAL HISTORY: Past Medical History  Diagnosis Date  . Multiple sclerosis   . Hypertension   . Glaucoma     PAST SURGICAL HISTORY: Past Surgical History  Procedure Laterality Date  . Partial hysterectomy      FAMILY HISTORY: Family History  Problem Relation Age of Onset  . Congestive Heart Failure Mother   . Heart attack Father   . Stroke Sister   . Heart disease Brother   . Heart disease Sister   . Heart disease Brother   . Heart disease Brother     SOCIAL HISTORY:  History   Social History  . Marital Status: Married    Spouse Name: N/A   . Number of Children: N/A  . Years of Education: N/A   Occupational History  . Not on file.   Social History Main Topics  . Smoking status: Former Research scientist (life sciences)  . Smokeless tobacco: Never Used  . Alcohol Use: 0.0 oz/week    0 Standard drinks or equivalent per week     Comment: 1-2 glasses of wine at night/fim  . Drug Use: No  . Sexual Activity: Not on file   Other Topics Concern  . Not on file   Social History Narrative     PHYSICAL EXAM  Filed Vitals:   10/12/14 1049  BP: 160/80  Pulse: 72  Resp: 16  Height: 5\' 3"  (1.6 m)  Weight: 139 lb (63.05 kg)    Body mass index is 24.63 kg/(m^2).   General: The patient is well-developed and well-nourished and in no acute distress  Neck: The neck is supple.  The neck is  nontender.   Neurologic Exam  Mental status: The patient is alert and oriented x 3 at the time of the examination. The patient has apparent normal recent and remote memory, with an apparently normal attention span and concentration ability.   Speech is normal.  Cranial nerves: Extraocular movements are full.   Facial symmetry is present. There is good facial sensation to soft touch bilaterally.Facial strength is normal.  Trapezius and sternocleidomastoid strength is normal. No dysarthria is noted.  The tongue is midline, and the patient has symmetric elevation of the soft palate. No obvious hearing deficits are noted.  Motor:  Muscle bulk is normal.   Tone is normal. Strength is  5 / 5 in all 4 extremities.   Sensory: Sensory testing is intact to touch and vibration  Coordination: Cerebellar testing reveals good finger-nose-finger .  Gait and station: Station is normal.   Gait is normal. Tandem gait is wide.    Reflexes: Deep tendon reflexes are symmetric and normal bilaterally.       DIAGNOSTIC DATA (LABS, IMAGING, TESTING) - I reviewed patient records, labs, notes, testing and imaging myself where available.      ASSESSMENT AND PLAN  Multiple  sclerosis  Trigeminal neuralgia  Urinary frequency  Anxiety disorder, unspecified anxiety disorder type  Other fatigue    1.    Continue lamotrigine 150 mg po tid and Tegretol 200 mg po tid 2.    Continue other medications. 3.   Continue rebif for MS.    4.  Try to stay active and exercise as tolerated. rtc 4 months, sooner if problems  Richard A. Felecia Shelling, MD, PhD 0/34/9179, 15:05 AM Certified in Neurology, Clinical Neurophysiology, Sleep Medicine, Pain Medicine and Neuroimaging  [facility] [address], [place], [place] [address] [phone]   11S

## 2014-10-13 ENCOUNTER — Ambulatory Visit: Payer: Self-pay | Admitting: Neurology

## 2014-10-25 ENCOUNTER — Telehealth: Payer: Self-pay | Admitting: *Deleted

## 2014-10-25 ENCOUNTER — Other Ambulatory Visit: Payer: Self-pay

## 2014-10-25 MED ORDER — CARBAMAZEPINE 200 MG PO TABS: 200.0000 mg | ORAL_TABLET | Freq: Three times a day (TID) | ORAL | Status: DC

## 2014-10-25 MED ORDER — HYDROCODONE-ACETAMINOPHEN 5-325 MG PO TABS: 1.0000 | ORAL_TABLET | Freq: Four times a day (QID) | ORAL | Status: DC | PRN

## 2014-10-25 MED ORDER — LAMOTRIGINE 150 MG PO TABS: 150.0000 mg | ORAL_TABLET | Freq: Three times a day (TID) | ORAL | Status: DC

## 2014-10-25 NOTE — Telephone Encounter (Signed)
I have spoken with Crystal Huber this afternoon--she sts. she is in [place], has been frequently waking up at 0230 with increased facial pain.  Sts. she then takes a Hydrocodone 5/325, which helps after awhile.  Per RAS ok, I have advised she take the Hydrocodone at 10pm (hs) and see if this helps more.  She is agreeable with this plan.  If this does not work, per RAS, next option would be to take one extra lamictal if she wakes up with pain/fim

## 2014-10-25 NOTE — Telephone Encounter (Signed)
Pt said she just spoke to you, she told you she needs refill on Oxycodone 5mg , she does NOT take that, she actually needs HYDROcodone-acetaminophen (NORCO/VICODIN) 5-325 MG per tablet

## 2014-10-25 NOTE — Telephone Encounter (Signed)
Rx request has been entered, forwarded to provider for approval.

## 2014-10-26 ENCOUNTER — Encounter: Payer: Self-pay | Admitting: *Deleted

## 2014-10-26 NOTE — Progress Notes (Signed)
Hydrocodone rx. up front [facility]/fim 

## 2014-10-28 NOTE — Telephone Encounter (Signed)
Patient called, she is "still having breakthrough's"

## 2014-10-28 NOTE — Telephone Encounter (Signed)
I called back and spoke with the patient.  Per Faiths note below, patient can try one Lamictal if she wakes up with pain.  Patient expressed understanding and was agreeable to trying this.  She will call back if anything further is needed.

## 2014-11-02 ENCOUNTER — Telehealth: Payer: Self-pay | Admitting: Neurology

## 2014-11-02 NOTE — Telephone Encounter (Signed)
Patient called requesting refill on HYDROcodone-acetaminophen (NORCO/VICODIN) 5-325 MG per tablet. She will have friend Aldona Lento pick it up as patient is currently out of town in [place]. Lovena Neighbours is going to mail it to her. Call patient if there is a problem and if you don't get her, leave a message. Patient said to tell you that she is really doing good.

## 2014-11-02 NOTE — Telephone Encounter (Signed)
Rx. placed up front 10-26-14.  It is ok for Lavonna to pick rx. up this time--in the future, she will need to be added to [employer] in order to pick up rx's/fim

## 2014-11-15 ENCOUNTER — Ambulatory Visit: Payer: Self-pay | Admitting: Neurology

## 2015-03-22 ENCOUNTER — Telehealth: Payer: Self-pay | Admitting: Neurology

## 2015-03-22 NOTE — Telephone Encounter (Signed)
Patient called to advise that she mailed insurance form (done every 6 months, to prove she is still disabled).

## 2015-03-24 NOTE — Telephone Encounter (Signed)
Tried to reach Crystal Huber--no answer and vm not set up.  I have her supplemental report of disability ready--as she has completed her portion, I will mail it back to [employer] and also mail a copy to her for her records/fim

## 2015-04-14 ENCOUNTER — Encounter: Payer: Self-pay | Admitting: Neurology

## 2015-04-14 ENCOUNTER — Ambulatory Visit (INDEPENDENT_AMBULATORY_CARE_PROVIDER_SITE_OTHER): Payer: Medicare Other | Admitting: Neurology

## 2015-04-14 VITALS — BP 136/86 | HR 62 | Resp 16 | Ht 63.0 in | Wt 139.0 lb

## 2015-04-14 DIAGNOSIS — F413 Other mixed anxiety disorders: Secondary | ICD-10-CM

## 2015-04-14 DIAGNOSIS — G35 Multiple sclerosis: Secondary | ICD-10-CM

## 2015-04-14 DIAGNOSIS — R5383 Other fatigue: Secondary | ICD-10-CM | POA: Diagnosis not present

## 2015-04-14 DIAGNOSIS — G500 Trigeminal neuralgia: Secondary | ICD-10-CM | POA: Diagnosis not present

## 2015-04-14 DIAGNOSIS — R261 Paralytic gait: Secondary | ICD-10-CM | POA: Diagnosis not present

## 2015-04-14 DIAGNOSIS — R350 Frequency of micturition: Secondary | ICD-10-CM

## 2015-04-14 MED ORDER — BACLOFEN 10 MG PO TABS: 10.0000 mg | ORAL_TABLET | Freq: Three times a day (TID) | ORAL | Status: DC

## 2015-04-14 NOTE — Patient Instructions (Addendum)
Take 4000 or 5000 Units of Vitamin D daily.  Continue medications  Try to stay active and exercise as tolerated.   Get a good night's rest

## 2015-04-14 NOTE — Progress Notes (Signed)
[facility]  PATIENT: Crystal Huber DOB: 1945-08-14  REFERRING DOCTOR OR PCP:  [place]: Patient  _________________________________   HISTORICAL  CHIEF COMPLAINT:  Chief Complaint  Patient presents with  . Multiple Sclerosis    Sts. she continues to tolerate Rebif well.  Sts. trigeminal neuralgia is stable as well.  Her husband passed away in December/fim    HISTORY OF PRESENT ILLNESS:  Crystal Huber is a 70 year old woman who was diagnosed with multiple sclerosis in 2002 who has had a lot of difficulty with trigeminal neuralgia.     Her husband, Crystal Huber, died in 03/06/2023.   He had parkinsonism.    She is on Rebif.   She uses the Rebiject II and prefers not to change to the new autoinjector.   She tolerates Rebif well and she has not had recent exacerbation.    However, she has had more dizziness lately.   She notes some lightheadedness after standing up.   However, the last 2 weeks have been better as she feels she is sleeping better.     Trigeminal Neuralgia:  This is doing better the [past few months.     When it was severe, the Sphenocath procedure helped.  She is taking 3 x  200 mg tegretols and 3 x 150 mg Lamictal and baclofen 10 mg nightly (too sleepy if taken during the day).  Higher tegretol dose caused dizziness.    The distribution is in the V2 neurotome.   Gait/Strength/sensation:  Gait is stable.   Sometimes she will feel a little unsteady, especially when she gets out of bed at night. She has not Huber recently.  She denies any numbness or weakness in her legs.   Bladder:  She notes a little bit of bladder urgency at times and will have rare stress incontinence. She has some hesitancy but decided against tamsulosin.  . She denies any recent urinary tract infections.  Vision:   She denies any visual problems.  Fatigue:  She notes some fatigue that often worsens as the day goes on.  This is better since reducing Tegretol.   She occasionally  takes an energy drink. This is both physical and cognitive. She falls asleep easily and will wake up usually twice a night to use the bathroom.   She quickly falls back asleep.   Mood/cognition:   She has more stress with husband dying last month.   She feels she is handling his death well.     She denies any significant depression and he anxiety is doing well.  She has some benefit from citalopram and tolerates it well.   She notes mild cognitive dysfunction and has noted some verbal fluency issues and also has mild short-term memory issues at times.   MS History:   She had MRI and LP consistent with the diagnosis of MS in 2002 with trigeminal neuralgia.   She was started on Rebif.   She is on Rebif 22 mcg, tolerates it well and has had no definite exacerbation.  Last MRI was 2-3 years ago.    Her last MRI was 05/25/14 and showed no acute findings.    REVIEW OF SYSTEMS: Constitutional: No fevers, chills, sweats, or change in appetite.   Fatigue Eyes: No visual changes, double vision, eye pain Ear, nose and throat: No hearing loss, ear pain, nasal congestion, sore throat Cardiovascular: No chest pain, palpitations Respiratory: No shortness of breath at rest or with exertion.   No wheezes GastrointestinaI: No  nausea, vomiting, diarrhea, abdominal pain, fecal incontinence Genitourinary: No dysuria, urinary retention or frequency.  No nocturia. Musculoskeletal: No neck pain, back pain Integumentary: No rash, pruritus, skin lesions Neurological: as above Psychiatric: Some depression and  Mild anxiety Endocrine: No palpitations, diaphoresis, change in appetite, change in weigh or increased thirst Hematologic/Lymphatic: No anemia, purpura, petechiae..   Bruises easily Allergic/Immunologic: No itchy/runny eyes, nasal congestion, recent allergic reactions, rashes  ALLERGIES: Allergies  Allergen Reactions  . Metronidazole Nausea Only  . Penicillins Swelling    Yeast infection  . Sulfa  Antibiotics Swelling    yeast infection    HOME MEDICATIONS:  Current outpatient prescriptions:  .  baclofen (LIORESAL) 10 MG tablet, Take 10 mg by mouth., Disp: , Rfl:  .  calcium-vitamin D (CALCIUM 500/D) 500-200 MG-UNIT per tablet, Take by mouth., Disp: , Rfl:  .  carbamazepine (TEGRETOL) 200 MG tablet, Take 1 tablet (200 mg total) by mouth 3 (three) times daily., Disp: 90 tablet, Rfl: 6 .  citalopram (CELEXA) 20 MG tablet, Take 1 tablet daily, Disp: , Rfl:  .  HYDROcodone-acetaminophen (NORCO/VICODIN) 5-325 MG per tablet, Take 1 tablet by mouth every 6 (six) hours as needed for moderate pain., Disp: 30 tablet, Rfl: 0 .  hydrocortisone 2.5 % cream, Apply topically., Disp: , Rfl:  .  Interferon Beta-1a (REBIF) 22 MCG/0.5ML SOSY, Inject 22 mcg into the skin 3 (three) times a week., Disp: 12 Syringe, Rfl: 12 .  lamoTRIgine (LAMICTAL) 150 MG tablet, Take 1 tablet (150 mg total) by mouth 3 (three) times daily., Disp: 90 tablet, Rfl: 6 .  olmesartan (BENICAR) 20 MG tablet, Take 20 mg by mouth., Disp: , Rfl:  .  oxyCODONE-acetaminophen (PERCOCET) 10-325 MG per tablet, One po q 8 hours as needed, Disp: 15 tablet, Rfl: 0 .  tamsulosin (FLOMAX) 0.4 MG CAPS capsule, Take 1 capsule (0.4 mg total) by mouth daily., Disp: 30 capsule, Rfl: 11 .  meclizine (ANTIVERT) 12.5 MG tablet, Take 12.5 mg by mouth. Reported on 04/14/2015, Disp: , Rfl:  .  vitamin B-12 (CYANOCOBALAMIN) 100 MCG tablet, Place under the tongue. Reported on 04/14/2015, Disp: , Rfl:   PAST MEDICAL HISTORY: Past Medical History  Diagnosis Date  . Multiple sclerosis ([place])   . Hypertension   . Glaucoma     PAST SURGICAL HISTORY: Past Surgical History  Procedure Laterality Date  . Partial hysterectomy      FAMILY HISTORY: Family History  Problem Relation Age of Onset  . Congestive Heart Failure Mother   . Heart attack Father   . Stroke Sister   . Heart disease Brother   . Heart disease Sister   . Heart disease Brother   .  Heart disease Brother     SOCIAL HISTORY:  Social History   Social History  . Marital Status: Married    Spouse Name: N/A  . Number of Children: N/A  . Years of Education: N/A   Occupational History  . Not on file.   Social History Main Topics  . Smoking status: Former Research scientist (life sciences)  . Smokeless tobacco: Never Used  . Alcohol Use: 0.0 oz/week    0 Standard drinks or equivalent per week     Comment: 1-2 glasses of wine at night/fim  . Drug Use: No  . Sexual Activity: Not on file   Other Topics Concern  . Not on file   Social History Narrative     PHYSICAL EXAM  Filed Vitals:   04/14/15 1124  BP: 136/86  Pulse:  62  Resp: 16  Height: 5\' 3"  (1.6 m)  Weight: 139 lb (63.05 kg)    Body mass index is 24.63 kg/(m^2).   General: The patient is well-developed and well-nourished and in no acute distress  Neck: The neck is supple.  The neck is nontender.   Neurologic Exam  Mental status: The patient is alert and oriented x 3 at the time of the examination. The patient has apparent normal recent and remote memory, with an apparently normal attention span and concentration ability.   Speech is normal.  Cranial nerves: Extraocular movements are full.   Facial symmetry is present. There is good facial sensation to soft touch bilaterally.   Facial strength is normal.  Trapezius and sternocleidomastoid strength is normal. No dysarthria is noted.  The tongue is midline, and the patient has symmetric elevation of the soft palate. No obvious hearing deficits are noted.  Motor:  Muscle bulk is normal.   Tone is normal. Strength is  5 / 5 in all 4 extremities.   Sensory: Sensory testing is intact to touch and vibration  Coordination: Cerebellar testing reveals good finger-nose-finger .  Gait and station: Station is normal.   Gait is near normal. Tandem gait is wide.    Reflexes: Deep tendon reflexes are symmetric and normal bilaterally.       DIAGNOSTIC DATA (LABS, IMAGING,  TESTING) - I reviewed patient records, labs, notes, testing and imaging myself where available.      ASSESSMENT AND PLAN  Multiple sclerosis (HCC)  Trigeminal neuralgia  Other mixed anxiety disorders  Other fatigue  Jerking gait  Urinary frequency    1.    Continue lamotrigine 150 mg po tid and Tegretol 200 mg po tid.   Baclofen prn 2.    Continue other medications. 3.    Continue Rebif for MS.    4.   Try to stay active and exercise as tolerated.   Get a good night's rest rtc 4 months, sooner if problems  Sharonica Kraszewski A. Felecia Shelling, MD, PhD [id number], [id number] AM Certified in Neurology, Clinical Neurophysiology, Sleep Medicine, Pain Medicine and Neuroimaging  [facility] [address], [place], [place] [address] [phone]   11S

## 2015-05-17 ENCOUNTER — Other Ambulatory Visit: Payer: Self-pay | Admitting: Neurology

## 2015-06-29 ENCOUNTER — Telehealth: Payer: Self-pay | Admitting: Neurology

## 2015-06-29 ENCOUNTER — Other Ambulatory Visit: Payer: Self-pay | Admitting: Neurology

## 2015-06-29 NOTE — Telephone Encounter (Signed)
I have spoken with Crystal Huber this afternoon.  She sts. she has had a very stressful week.  sts. had some numbness in her right hand and foot that is improving with exercise. She also sts. trigeminal neuralgia has been much better, an she would like to discuss decreasing Lamictal, Oxcarbazepine.  She will wait to discuss this until she is under less stress.  She will call back if numbness right hand/foot persist or worsen/fim

## 2015-06-29 NOTE — Telephone Encounter (Signed)
Patient called, states numbness in right hand and foot, has been under a lot of stress, my husband died in 03-04-23 and I've had a lot of things to get done. My PCP increased BP medicine (blood pressure still a little high) and placed on Paxil 2 weeks ago.

## 2015-07-31 ENCOUNTER — Other Ambulatory Visit: Payer: Self-pay | Admitting: Neurology

## 2015-08-31 ENCOUNTER — Telehealth: Payer: Self-pay | Admitting: Neurology

## 2015-08-31 NOTE — Telephone Encounter (Signed)
Pt called said she is having dizzy spells for about the past month. Said PCP has started her on wellbutrin. The PCP thinks the carbamazepine (TEGRETOL) 200 MG tablet is making her dizzy. She is aware Dr Felecia Shelling is out of the clinic until Monday. She said RN could call or wait until next week.

## 2015-09-04 NOTE — Telephone Encounter (Signed)
I have spoken with Crystal Huber this morning.  She continues to c/o episodes of vertigo. (not new for her.)  It is not interfering with her lifestyle--she is traveling this week.  She has an appt with RAS next week and will discuss further at that time/fim

## 2015-09-11 ENCOUNTER — Ambulatory Visit (INDEPENDENT_AMBULATORY_CARE_PROVIDER_SITE_OTHER): Payer: Medicare Other | Admitting: Neurology

## 2015-09-11 ENCOUNTER — Encounter: Payer: Self-pay | Admitting: Neurology

## 2015-09-11 VITALS — BP 136/80 | HR 70 | Resp 14 | Ht 63.0 in | Wt 142.0 lb

## 2015-09-11 DIAGNOSIS — R5383 Other fatigue: Secondary | ICD-10-CM | POA: Diagnosis not present

## 2015-09-11 DIAGNOSIS — G44099 Other trigeminal autonomic cephalgias (TAC), not intractable: Secondary | ICD-10-CM

## 2015-09-11 DIAGNOSIS — G35 Multiple sclerosis: Secondary | ICD-10-CM

## 2015-09-11 DIAGNOSIS — R350 Frequency of micturition: Secondary | ICD-10-CM

## 2015-09-11 DIAGNOSIS — G500 Trigeminal neuralgia: Secondary | ICD-10-CM | POA: Diagnosis not present

## 2015-09-11 DIAGNOSIS — R269 Unspecified abnormalities of gait and mobility: Secondary | ICD-10-CM

## 2015-09-11 NOTE — Progress Notes (Signed)
[facility]  PATIENT: Crystal Huber DOB: 1946/03/14  REFERRING DOCTOR OR PCP:  [place]: Patient  _________________________________   HISTORICAL  CHIEF COMPLAINT:  Chief Complaint  Patient presents with  . Multiple Sclerosis    Sts. she continues to tolerate Rebif well.  Sts. facial pain is well controlled right now.  Sts. vertigo has been worse since her husban was sick and passed away.  Sts. dizziness is worse in he am, but improves with Meclizine/fim  . Trigeminal Neuralgia  . Dizziness    HISTORY OF PRESENT ILLNESS:  Crystal Huber is a 70 year old woman who was diagnosed with multiple sclerosis in 2002 who has had a lot of difficulty with trigeminal neuralgia and dizziness   She is on Rebif.   She uses the Rebiject II and prefers not to change to the new autoinjector.   She tolerates Rebif well and she has not had recent exacerbation.    Trigeminal Neuralgia:  This is doing ok again .   Pain is in the distribution of the V2 neurotome   When it was severe last year, the Sphenocath procedure helped.  She is taking 3 x  200 mg tegretols and 3 x 150 mg Lamictal and baclofen 10 mg nightly (too sleepy if taken during the day).  She has had dizziness on the current dose of med's and we discussed reducing lamotrigine to 1/2 pill (75 mg) tid to see if dizziness improved.   Gait/Strength/sensation:  Gait is stable.   Sometimes she will feel a little unsteady, especially when she gets out of bed at night. She has not fallen recently.  She denies any numbness or weakness in her legs.   Bladder:  She notes a little bit of bladder urgency at times and will have rare stress incontinence. She has some hesitancy but decided against tamsulosin.  . She denies any recent urinary tract infections.  Vision:   She denies any visual problems.  Fatigue:  She notes  fatigue that often worsens as the day goes on.  This is better since reducing Tegretol from 4 to 3 pills    She occasionally takes an energy drink. This is both physical and cognitive. She falls asleep easily and will wake up usually twice a night to use the bathroom.   She quickly falls back asleep.   Mood/cognition:   She was placed on Wellbutrin after her husband died last month.   She feels she is handling his death well.     She denies any significant depression.    Her anxiety is doing well.    She notes mild cognitive dysfunction and has noted some verbal fluency issues and also has mild short-term memory issues at times.    She drinks one glass of wine most nights.    Other:    She takes D3, Ca, B12, Fish oil and we reviewed her   MS History:   She had MRI and LP consistent with the diagnosis of MS in 2002 with trigeminal neuralgia.   She was started on Rebif.   She is on Rebif 22 mcg, tolerates it well and has had no definite exacerbation.  Last MRI was 2-3 years ago.    Her last MRI was 05/25/14 and showed no acute findings.    REVIEW OF SYSTEMS: Constitutional: No fevers, chills, sweats, or change in appetite.   Fatigue Eyes: No visual changes, double vision, eye pain Ear, nose and throat: No hearing loss, ear pain, nasal  congestion, sore throat Cardiovascular: No chest pain, palpitations Respiratory: No shortness of breath at rest or with exertion.   No wheezes GastrointestinaI: No nausea, vomiting, diarrhea, abdominal pain, fecal incontinence Genitourinary: No dysuria, urinary retention or frequency.  No nocturia. Musculoskeletal: No neck pain, back pain Integumentary: No rash, pruritus, skin lesions Neurological: as above Psychiatric: Some depression and  Mild anxiety Endocrine: No palpitations, diaphoresis, change in appetite, change in weigh or increased thirst Hematologic/Lymphatic: No anemia, purpura, petechiae..   Bruises easily Allergic/Immunologic: No itchy/runny eyes, nasal congestion, recent allergic reactions, rashes  ALLERGIES: Allergies  Allergen Reactions  .  Metronidazole Nausea Only  . Penicillins Swelling    Yeast infection  . Sulfa Antibiotics Swelling    yeast infection    HOME MEDICATIONS:  Current outpatient prescriptions:  .  baclofen (LIORESAL) 10 MG tablet, Take 1 tablet (10 mg total) by mouth 3 (three) times daily., Disp: 90 each, Rfl: 11 .  buPROPion (WELLBUTRIN XL) 150 MG 24 hr tablet, Take 150 mg by mouth., Disp: , Rfl:  .  calcium citrate-vitamin D (CITRACAL+D) 315-200 MG-UNIT tablet, Take 1 tablet by mouth 2 (two) times daily., Disp: , Rfl:  .  calcium-vitamin D (CALCIUM 500/D) 500-200 MG-UNIT per tablet, Take by mouth., Disp: , Rfl:  .  carbamazepine (TEGRETOL) 200 MG tablet, TAKE 1 TABLET BY MOUTH 4 TIMES DAILY., Disp: 120 tablet, Rfl: 12 .  HYDROcodone-acetaminophen (NORCO/VICODIN) 5-325 MG per tablet, Take 1 tablet by mouth every 6 (six) hours as needed for moderate pain., Disp: 30 tablet, Rfl: 0 .  hydrocortisone 2.5 % cream, Apply topically., Disp: , Rfl:  .  lactobacillus acidophilus (BACID) TABS tablet, Take 1 tablet by mouth daily., Disp: , Rfl:  .  lamoTRIgine (LAMICTAL) 150 MG tablet, Take 1 tablet (150 mg total) by mouth 3 (three) times daily., Disp: 90 tablet, Rfl: 6 .  meclizine (ANTIVERT) 25 MG tablet, , Disp: , Rfl:  .  olmesartan (BENICAR) 20 MG tablet, Take 20 mg by mouth., Disp: , Rfl:  .  Omega-3 Fatty Acids (FISH OIL PO), Take 1,400 mg by mouth daily., Disp: , Rfl:  .  oxyCODONE-acetaminophen (PERCOCET) 10-325 MG per tablet, One po q 8 hours as needed, Disp: 15 tablet, Rfl: 0 .  REBIF 22 MCG/0.5ML SOSY, INJECT 1 SYRINGE INTO THESKIN 3 TIMES A WEEK, Disp: 6 Syringe, Rfl: 12 .  tamsulosin (FLOMAX) 0.4 MG CAPS capsule, Take 1 capsule (0.4 mg total) by mouth daily., Disp: 30 capsule, Rfl: 11 .  vitamin B-12 (CYANOCOBALAMIN) 100 MCG tablet, Place under the tongue. Reported on 04/14/2015, Disp: , Rfl:   PAST MEDICAL HISTORY: Past Medical History  Diagnosis Date  . Multiple sclerosis ([place])   . Hypertension   .  Glaucoma     PAST SURGICAL HISTORY: Past Surgical History  Procedure Laterality Date  . Partial hysterectomy      FAMILY HISTORY: Family History  Problem Relation Age of Onset  . Congestive Heart Failure Mother   . Heart attack Father   . Stroke Sister   . Heart disease Brother   . Heart disease Sister   . Heart disease Brother   . Heart disease Brother     SOCIAL HISTORY:  Social History   Social History  . Marital Status: Married    Spouse Name: N/A  . Number of Children: N/A  . Years of Education: N/A   Occupational History  . Not on file.   Social History Main Topics  . Smoking status: Former Research scientist (life sciences)  .  Smokeless tobacco: Never Used  . Alcohol Use: 0.0 oz/week    0 Standard drinks or equivalent per week     Comment: 1-2 glasses of wine at night/fim  . Drug Use: No  . Sexual Activity: Not on file   Other Topics Concern  . Not on file   Social History Narrative     PHYSICAL EXAM  Filed Vitals:   09/11/15 1114  BP: 136/80  Pulse: 70  Resp: 14  Height: 5\' 3"  (1.6 m)  Weight: 142 lb (64.411 kg)    Body mass index is 25.16 kg/(m^2).   General: The patient is well-developed and well-nourished and in no acute distress  Neck: The neck is supple.  The neck is nontender.   Neurologic Exam  Mental status: The patient is alert and oriented x 3 at the time of the examination. The patient has apparent normal recent and remote memory, with an apparently normal attention span and concentration ability.   Speech is normal.  Cranial nerves: Extraocular movements are full.   Facial symmetry is present. There is good facial sensation to soft touch bilaterally.   Facial strength is normal.  Trapezius and sternocleidomastoid strength is normal. No dysarthria is noted.  The tongue is midline, and the patient has symmetric elevation of the soft palate. No obvious hearing deficits are noted.  Motor:  Muscle bulk is normal.   Tone is normal. Strength is  5 / 5 in  all 4 extremities.   Sensory: Sensory testing is intact to touch and vibration  Coordination: Cerebellar testing reveals good finger-nose-finger .  Gait and station: Station is normal.   Gait is near normal. Tandem gait is wide.    Reflexes: Deep tendon reflexes are symmetric and normal bilaterally.       DIAGNOSTIC DATA (LABS, IMAGING, TESTING) - I reviewed patient records, labs, notes, testing and imaging myself where available.      ASSESSMENT AND PLAN  Multiple sclerosis (HCC)  Trigeminal neuralgia  Other trigeminal autonomic cephalgia (TAC)  Other fatigue  Gait disorder  Urinary frequency    1.    Reduce lamotrigine to 75 mg po tid and Tegretol 200 mg po tid.   Baclofen prn 2.    Continue other medications. 3.    Continue Rebif for MS.    4.    Try to stay active and exercise as tolerated.   Get a good night's rest rtc 4 months, sooner if problems  Richard A. Felecia Shelling, MD, PhD [id number], [id number] AM Certified in Neurology, Clinical Neurophysiology, Sleep Medicine, Pain Medicine and Neuroimaging  [facility] [address], [place], [place] [address] [phone]

## 2015-09-11 NOTE — Patient Instructions (Signed)
Reduce your lamotrigine to 75 mg 3 times a day. You can reduce this further to just twice a day in a few weeks. If your trigeminal neuralgia pain worsens you could always go back to the full dose.

## 2015-10-02 ENCOUNTER — Telehealth: Payer: Self-pay | Admitting: *Deleted

## 2015-10-02 NOTE — Telephone Encounter (Signed)
Disability paperwork completed and faxed to [facility].  Copy mailed to pt. for her records. and copy sent to be scanned into EPIC/fim

## 2015-10-05 NOTE — Telephone Encounter (Signed)
Patient called requesting to speak with Faith regarding disability paperwork, has a question. Please call [phone].

## 2015-10-05 NOTE — Telephone Encounter (Signed)
I have spoken with Crystal Huber this afternoon--she sts. she received her disability paperwork--no longer has any questions/fim

## 2015-12-07 ENCOUNTER — Telehealth: Payer: Self-pay

## 2015-12-13 ENCOUNTER — Telehealth: Payer: Self-pay

## 2015-12-13 ENCOUNTER — Telehealth: Payer: Self-pay | Admitting: Neurology

## 2015-12-13 NOTE — Telephone Encounter (Signed)
Received records from [facility] forward 7 pages to Dr. Arlice Colt.

## 2015-12-13 NOTE — Telephone Encounter (Signed)
Rec'd from [facility] forward 8 pages to GI Historical Provider

## 2015-12-18 ENCOUNTER — Telehealth: Payer: Self-pay | Admitting: Gastroenterology

## 2015-12-18 NOTE — Telephone Encounter (Signed)
Previous GI Records received from [facility] and placed on Dr. Woodward Ku desk for review.  Pt is requesting a colonoscopy and to switch from [facility] to Dr. Silverio Decamp bc she has heard good things about Dr. Silverio Decamp.

## 2015-12-26 NOTE — Telephone Encounter (Signed)
Dr. Silverio Decamp reviewed records and has accepted patient. Ok to schedule Direct Colonoscopy. Left message for patient to return my call

## 2016-01-11 ENCOUNTER — Encounter: Payer: Self-pay | Admitting: Gastroenterology

## 2016-02-12 ENCOUNTER — Ambulatory Visit (INDEPENDENT_AMBULATORY_CARE_PROVIDER_SITE_OTHER): Payer: Medicare Other | Admitting: Neurology

## 2016-02-12 ENCOUNTER — Encounter: Payer: Self-pay | Admitting: Neurology

## 2016-02-12 VITALS — BP 148/90 | HR 70 | Resp 14 | Ht 63.0 in | Wt 140.0 lb

## 2016-02-12 DIAGNOSIS — R269 Unspecified abnormalities of gait and mobility: Secondary | ICD-10-CM

## 2016-02-12 DIAGNOSIS — R5383 Other fatigue: Secondary | ICD-10-CM | POA: Diagnosis not present

## 2016-02-12 DIAGNOSIS — F413 Other mixed anxiety disorders: Secondary | ICD-10-CM | POA: Diagnosis not present

## 2016-02-12 DIAGNOSIS — G500 Trigeminal neuralgia: Secondary | ICD-10-CM | POA: Diagnosis not present

## 2016-02-12 DIAGNOSIS — R350 Frequency of micturition: Secondary | ICD-10-CM | POA: Diagnosis not present

## 2016-02-12 DIAGNOSIS — G35 Multiple sclerosis: Secondary | ICD-10-CM

## 2016-02-12 MED ORDER — HYDROCODONE-ACETAMINOPHEN 5-325 MG PO TABS: 1.0000 | ORAL_TABLET | Freq: Four times a day (QID) | ORAL | 0 refills | Status: DC | PRN

## 2016-02-12 MED ORDER — LAMOTRIGINE 150 MG PO TABS
150.0000 mg | ORAL_TABLET | Freq: Three times a day (TID) | ORAL | 6 refills | Status: DC
Start: 2016-02-12 — End: 2017-02-10

## 2016-02-12 NOTE — Progress Notes (Signed)
[facility]  PATIENT: Crystal Huber DOB: 02/01/1946  REFERRING DOCTOR OR PCP:  [place]: Patient  _________________________________   HISTORICAL  CHIEF COMPLAINT:  Chief Complaint  Patient presents with  . Multiple Sclerosis    Sts. she continues to tolerate Rebif well.  Sts she feels she is having to be more careful with her gait since turning 70.  Denies falls.  Denies exacerbation of facial pain in a couple of years.  Sts. is currently taking Tegretol 200mg  tid and lamictal 75mg  tid.  Sts. she has a dental cleaning scheduled next week and is worried she may need dental work that would cause an exacerbation of facial pain/fim  . Trigeminal Neuralgia    HISTORY OF PRESENT ILLNESS:  Crystal Huber is a 70 year old woman with MS diagnosed in 2002.  MS:   She feels mostly stable.    She is on Rebif.   She uses the Rebiject II and prefers not to change to the new autoinjector.   She tolerates Rebif well and she has not had recent exacerbation.    She has some skin reactions  Trigeminal Neuralgia:  This is doing ok again .   Pain is in the distribution of the V2 neurotome    She is taking 3 x  200 mg tegretols and 3 x 150 mg Lamictal and baclofen 10 mg nightly (too sleepy if taken during the day).   When it was severe in 2016, the Sphenocath procedure helped.   She has less dizziness on lower dose of  Lamotrigine .   Meclizine also helps the dizziness  Gait/Strength/sensation:  Gait is mildly of balanced. She needs to hold onto a side-rail with steps and sometimes holds the furniture or walls.    She has not fallen recently.  She denies any numbness or weakness in her legs.   Bladder:  She notes a little bit of bladder urgency at times and will have rare stress incontinence. She also has mild hesitancy but feels she emptie.  . She denies any recent urinary tract infections.  Vision:   She denies any visual  problems.  Fatigue:  She notes  fatigue is stable but worsens as the day goes on.  This is better since reducing Tegretol from 4 to 3 pills    This is both physical and cognitive. Caffeine helps some.  She falls asleep easily on low dose Xanax.    Mood/cognition:      She denies any significant depression.    Her anxiety is mild most days.    She notes mild cognitive dysfunction and has noted some verbal fluency issues and also has mild short-term memory issues at times.    She drinks one glass of wine most nights.     MS History:   She had MRI and LP consistent with the diagnosis of MS in 2002 with trigeminal neuralgia.   She was started on Rebif.   She is on Rebif 22 mcg, tolerates it well and has had no definite exacerbation.  Last MRI was 2-3 years ago.    Her last MRI was 05/25/14 and showed no acute findings.    REVIEW OF SYSTEMS: Constitutional: No fevers, chills, sweats, or change in appetite.   Fatigue Eyes: No visual changes, double vision, eye pain Ear, nose and throat: No hearing loss, ear pain, nasal congestion, sore throat Cardiovascular: No chest pain, palpitations Respiratory: No shortness of breath at rest or with exertion.  No wheezes GastrointestinaI: No nausea, vomiting, diarrhea, abdominal pain, fecal incontinence Genitourinary: No dysuria, urinary retention or frequency.  No nocturia. Musculoskeletal: No neck pain, back pain Integumentary: No rash, pruritus, skin lesions Neurological: as above Psychiatric: Some depression and  Mild anxiety Endocrine: No palpitations, diaphoresis, change in appetite, change in weigh or increased thirst Hematologic/Lymphatic: No anemia, purpura, petechiae..   Bruises easily Allergic/Immunologic: No itchy/runny eyes, nasal congestion, recent allergic reactions, rashes  ALLERGIES: Allergies  Allergen Reactions  . Metronidazole Nausea Only  . Penicillins Swelling    Yeast infection  . Sulfa Antibiotics Swelling    yeast infection     HOME MEDICATIONS:  Current Outpatient Prescriptions:  .  ALPRAZolam (XANAX) 0.5 MG tablet, Take 0.5 mg by mouth., Disp: , Rfl:  .  baclofen (LIORESAL) 10 MG tablet, Take 1 tablet (10 mg total) by mouth 3 (three) times daily., Disp: 90 each, Rfl: 11 .  buPROPion (WELLBUTRIN XL) 150 MG 24 hr tablet, Take 150 mg by mouth., Disp: , Rfl:  .  calcium citrate-vitamin D (CITRACAL+D) 315-200 MG-UNIT tablet, Take 1 tablet by mouth 2 (two) times daily., Disp: , Rfl:  .  carbamazepine (TEGRETOL) 200 MG tablet, TAKE 1 TABLET BY MOUTH 4 TIMES DAILY., Disp: 120 tablet, Rfl: 12 .  hydrocortisone 2.5 % cream, Apply topically., Disp: , Rfl:  .  lactobacillus acidophilus (BACID) TABS tablet, Take 1 tablet by mouth daily., Disp: , Rfl:  .  lamoTRIgine (LAMICTAL) 150 MG tablet, Take 1 tablet (150 mg total) by mouth 3 (three) times daily., Disp: 90 tablet, Rfl: 6 .  meclizine (ANTIVERT) 25 MG tablet, , Disp: , Rfl:  .  olmesartan (BENICAR) 20 MG tablet, Take 20 mg by mouth., Disp: , Rfl:  .  Omega-3 Fatty Acids (FISH OIL PO), Take 1,400 mg by mouth daily., Disp: , Rfl:  .  REBIF 22 MCG/0.5ML SOSY, INJECT 1 SYRINGE INTO THESKIN 3 TIMES A WEEK, Disp: 6 Syringe, Rfl: 12 .  tamsulosin (FLOMAX) 0.4 MG CAPS capsule, Take 1 capsule (0.4 mg total) by mouth daily., Disp: 30 capsule, Rfl: 11 .  vitamin B-12 (CYANOCOBALAMIN) 100 MCG tablet, Place under the tongue. Reported on 04/14/2015, Disp: , Rfl:  .  HYDROcodone-acetaminophen (NORCO/VICODIN) 5-325 MG per tablet, Take 1 tablet by mouth every 6 (six) hours as needed for moderate pain. (Patient not taking: Reported on 02/12/2016), Disp: 30 tablet, Rfl: 0  PAST MEDICAL HISTORY: Past Medical History:  Diagnosis Date  . Glaucoma   . Hypertension   . Multiple sclerosis ([place])     PAST SURGICAL HISTORY: Past Surgical History:  Procedure Laterality Date  . PARTIAL HYSTERECTOMY      FAMILY HISTORY: Family History  Problem Relation Age of Onset  . Congestive Heart  Failure Mother   . Heart attack Father   . Stroke Sister   . Heart disease Brother   . Heart disease Sister   . Heart disease Brother   . Heart disease Brother     SOCIAL HISTORY:  Social History   Social History  . Marital status: Married    Spouse name: N/A  . Number of children: N/A  . Years of education: N/A   Occupational History  . Not on file.   Social History Main Topics  . Smoking status: Former Research scientist (life sciences)  . Smokeless tobacco: Never Used  . Alcohol use 0.0 oz/week     Comment: 1-2 glasses of wine at night/fim  . Drug use: No  . Sexual activity: Not on file  Other Topics Concern  . Not on file   Social History Narrative  . No narrative on file     PHYSICAL EXAM  Vitals:   02/12/16 1017  BP: (!) 148/90  Pulse: 70  Resp: 14  Weight: 140 lb (63.5 kg)  Height: 5\' 3"  (1.6 m)    Body mass index is 24.8 kg/m.   General: The patient is well-developed and well-nourished and in no acute distress  Neck: The neck is supple.  The neck is nontender.  Neurologic Exam  Mental status: The patient is alert and oriented x 3 at the time of the examination. The patient has apparent normal recent and remote memory, with an apparently normal attention span and concentration ability.   Speech is normal.  Cranial nerves: Extraocular movements are full.   Facial symmetry is present. There is good facial sensation to soft touch bilaterally.   Facial strength is normal.  Trapezius and sternocleidomastoid strength is normal. No dysarthria is noted.  The tongue is midline, and the patient has symmetric elevation of the soft palate. No obvious hearing deficits are noted.  Motor:  Muscle bulk is normal.   Tone is normal. Strength is  5 / 5 in all 4 extremities.   Sensory: Sensory testing is intact to touch and vibration  Coordination: Cerebellar testing reveals good finger-nose-finger .  Gait and station: Station is normal.   Gait is near normal. Tandem gait is poor and she  needs to hold on..    Reflexes: Deep tendon reflexes are symmetric and normal bilaterally.       DIAGNOSTIC DATA (LABS, IMAGING, TESTING) - I reviewed patient records, labs, notes, testing and imaging myself where available.      ASSESSMENT AND PLAN  Multiple sclerosis ([facility])  Trigeminal neuralgia  Other fatigue  Gait disorder  Urinary frequency  Other mixed anxiety disorders   1.    Continue Rebif for MS.   2.    Lamotrigine to 75 mg po tid and Tegretol 200 mg po tid for Trigeminal neuralgia.   Baclofen prn 3.    Stay active and exercise as tolerated.    4.    rtc 4-6 months, sooner if problems  45 minute face-to-face evaluation with greater than one half of the time counseling or coordinating care about her MS and related symptoms.  Richard A. Felecia Shelling, MD, PhD [id number], [id number] AM Certified in Neurology, Clinical Neurophysiology, Sleep Medicine, Pain Medicine and Neuroimaging  [facility] [address], [place], [place] [address] [phone]

## 2016-02-22 ENCOUNTER — Ambulatory Visit (AMBULATORY_SURGERY_CENTER): Payer: Self-pay | Admitting: *Deleted

## 2016-02-22 VITALS — Ht 64.0 in | Wt 140.0 lb

## 2016-02-22 DIAGNOSIS — Z86010 Personal history of colonic polyps: Secondary | ICD-10-CM

## 2016-02-22 MED ORDER — NA SULFATE-K SULFATE-MG SULF 17.5-3.13-1.6 GM/177ML PO SOLN: 1.0000 | Freq: Once | ORAL | 0 refills | Status: AC

## 2016-02-22 NOTE — Progress Notes (Signed)
Constipation- pt uses colace prn- takes it every other night - will use prn when travels   No egg or soy allergy known to patient - no soy because of Breast cancer  No issues with past sedation with any surgeries  or procedures, no intubation problems  No diet pills per patient No home 02 use per patient  No blood thinners per patient  No A fib or A flutter   pt does not want blood transfusions

## 2016-03-28 ENCOUNTER — Encounter: Payer: Medicare Other | Admitting: Gastroenterology

## 2016-04-02 ENCOUNTER — Encounter: Payer: Medicare Other | Admitting: Gastroenterology

## 2016-04-15 ENCOUNTER — Telehealth: Payer: Self-pay | Admitting: Gastroenterology

## 2016-04-15 NOTE — Telephone Encounter (Signed)
Spoke with patient. Answered her questions reguarding prep instructions. Encouraged patient to see PCP for "indigestion".

## 2016-04-19 ENCOUNTER — Telehealth: Payer: Self-pay | Admitting: Gastroenterology

## 2016-04-19 ENCOUNTER — Ambulatory Visit (AMBULATORY_SURGERY_CENTER): Payer: Medicare Other | Admitting: Gastroenterology

## 2016-04-19 ENCOUNTER — Encounter: Payer: Self-pay | Admitting: Gastroenterology

## 2016-04-19 VITALS — BP 185/99 | HR 66 | Temp 97.8°F | Resp 15 | Ht 64.0 in | Wt 140.0 lb

## 2016-04-19 DIAGNOSIS — K635 Polyp of colon: Secondary | ICD-10-CM

## 2016-04-19 DIAGNOSIS — D124 Benign neoplasm of descending colon: Secondary | ICD-10-CM | POA: Diagnosis not present

## 2016-04-19 DIAGNOSIS — D125 Benign neoplasm of sigmoid colon: Secondary | ICD-10-CM

## 2016-04-19 DIAGNOSIS — Z86010 Personal history of colonic polyps: Secondary | ICD-10-CM

## 2016-04-19 MED ORDER — SODIUM CHLORIDE 0.9 % IV SOLN: 500.0000 mL | INTRAVENOUS | Status: DC

## 2016-04-19 NOTE — Progress Notes (Signed)
Please refer to the blue and neon green sheets for instructions regarding diet and activity for the rest of today.Called to room to assist during endoscopic procedure.  Patient ID and intended procedure confirmed with present staff. Received instructions for my participation in the procedure from the performing physician. 

## 2016-04-19 NOTE — Telephone Encounter (Signed)
Returned phone call to patient to advise that she can take Motrin as needed for pain. No answer but did leave message that she may take Motrin. Also, stated that if any of her symptoms worsened then she should give the on call physician a call at [phone].

## 2016-04-19 NOTE — Op Note (Signed)
[facility] Patient Name: Crystal Huber Procedure Date: 04/19/2016 11:52 AM MRN: [id number] Endoscopist: Mauri Pole , MD Age: 71 Referring MD:  Date of Birth: 1945/12/13 Gender: Female Account #: [id number] Procedure:                Colonoscopy Indications:              High risk colon cancer surveillance: Personal                            history of colonic polyps, Last colonoscopy: 2012 Medicines:                Monitored Anesthesia Care Procedure:                Pre-Anesthesia Assessment:                           - Prior to the procedure, a History and Physical                            was performed, and patient medications and                            allergies were reviewed. The patient's tolerance of                            previous anesthesia was also reviewed. The risks                            and benefits of the procedure and the sedation                            options and risks were discussed with the patient.                            All questions were answered, and informed consent                            was obtained. Prior Anticoagulants: The patient has                            taken no previous anticoagulant or antiplatelet                            agents. ASA Grade Assessment: II - A patient with                            mild systemic disease. After reviewing the risks                            and benefits, the patient was deemed in                            satisfactory condition to undergo the procedure.  After obtaining informed consent, the colonoscope                            was passed under direct vision. Throughout the                            procedure, the patient's blood pressure, pulse, and                            oxygen saturations were monitored continuously. The                            [id number] was introduced through the anus and                            advanced to the the  cecum, identified by                            appendiceal orifice and ileocecal valve. The                            colonoscopy was somewhat difficult due to                            restricted mobility of the colon, significant                            looping and the patient's body habitus. Successful                            completion of the procedure was aided by applying                            abdominal pressure. The patient tolerated the                            procedure well. The quality of the bowel                            preparation was good. The ileocecal valve,                            appendiceal orifice, and rectum were photographed. Scope In: 12:03:32 PM Scope Out: 12:36:10 PM Scope Withdrawal Time: 0 hours 13 minutes 16 seconds  Total Procedure Duration: 0 hours 32 minutes 38 seconds  Findings:                 The perianal and digital rectal examinations were                            normal.                           Two sessile polyps were found in the sigmoid colon  and descending colon. The polyps were 4 to 11 mm in                            size. These polyps were removed with a cold snare.                            Resection and retrieval were complete. Oozing of                            blood noted s/p poluypectomy at site of large polyp                            controlled with cautery (soft Coag) with snare tip.                            Evidence of possible barotrauma and oozing from                            mucosa noted in cecum, no visible tears or ulcers                           Multiple small and large-mouthed diverticula were                            found in the sigmoid colon.                           Non-bleeding internal hemorrhoids were found during                            retroflexion. The hemorrhoids were small. Complications:            No immediate complications. Estimated Blood Loss:      Estimated blood loss was minimal. Impression:               - Two 4 to 10 mm polyps in the sigmoid colon and in                            the descending colon, removed with a cold snare.                            Resected and retrieved.                           - Diverticulosis in the sigmoid colon.                           - Non-bleeding internal hemorrhoids. Recommendation:           - Patient has a contact number available for                            emergencies. The signs and symptoms of potential  delayed complications were discussed with the                            patient. Return to normal activities tomorrow.                            Written discharge instructions were provided to the                            patient.                           - Resume previous diet.                           - Continue present medications.                           - Await pathology results.                           - Repeat colonoscopy in 3 - 10 years for                            surveillance based on pathology results. Mauri Pole, MD 04/19/2016 12:43:47 PM This report has been signed electronically.

## 2016-04-19 NOTE — Progress Notes (Signed)
TO PACU  Pt awake and alert. Informed RN about elevated B/P. Pt needs to F/U with MD for longterm management.

## 2016-04-19 NOTE — Patient Instructions (Signed)
YOU HAD AN ENDOSCOPIC PROCEDURE TODAY AT [facility]:   Refer to the procedure report that was given to you for any specific questions about what was found during the examination.  If the procedure report does not answer your questions, please call your gastroenterologist to clarify.  If you requested that your care partner not be given the details of your procedure findings, then the procedure report has been included in a sealed envelope for you to review at your convenience later.  YOU SHOULD EXPECT: Some feelings of bloating in the abdomen. Passage of more gas than usual.  Walking can help get rid of the air that was put into your GI tract during the procedure and reduce the bloating. If you had a lower endoscopy (such as a colonoscopy or flexible sigmoidoscopy) you may notice spotting of blood in your stool or on the toilet paper. If you underwent a bowel prep for your procedure, you may not have a normal bowel movement for a few days.  Please Note:  You might notice some irritation and congestion in your nose or some drainage.  This is from the oxygen used during your procedure.  There is no need for concern and it should clear up in a day or so.  SYMPTOMS TO REPORT IMMEDIATELY:   Following lower endoscopy (colonoscopy or flexible sigmoidoscopy):  Excessive amounts of blood in the stool  Significant tenderness or worsening of abdominal pains  Swelling of the abdomen that is new, acute  Fever of 100F or higher  For urgent or emergent issues, a gastroenterologist can be reached at any hour by calling [phone].  DIET:  We do recommend a small meal at first, but then you may proceed to your regular diet.  Drink plenty of fluids but you should avoid alcoholic beverages for 24 hours.  ACTIVITY:  You should plan to take it easy for the rest of today and you should NOT DRIVE or use heavy machinery until tomorrow (because of the sedation medicines used during the test).     FOLLOW UP: Our staff will call the number listed on your records the next business day following your procedure to check on you and address any questions or concerns that you may have regarding the information given to you following your procedure. If we do not reach you, we will leave a message.  However, if you are feeling well and you are not experiencing any problems, there is no need to return our call.  We will assume that you have returned to your regular daily activities without incident.  If any biopsies were taken you will be contacted by phone or by letter within the next 1-3 weeks.  Please call us at [phone] if you have not heard about the biopsies in 3 weeks.   SIGNATURES/CONFIDENTIALITY: You and/or your care partner have signed paperwork which will be entered into your electronic medical record.  These signatures attest to the fact that that the information above on your After Visit Summary has been reviewed and is understood.  Full responsibility of the confidentiality of this discharge information lies with you and/or your care-partner.  Await pathology  Please read over handouts about polyps, diverticulosis, hemorrhoids, and high fiber diets  Please contact your family doctor about your blood pressure!!  Please continue your normal medications

## 2016-04-19 NOTE — Progress Notes (Signed)
Reiterated to pt the importance of notifying her family doctor about her high BPs while here today.  Upon arrival to the RR, pt c/o abd pain as a "7".  She is able to pass large amt of air while here.  Did assist her to BR to pass air and she ambulated twice around the nurse's station.  Levsin 0.125mg  SL x2 tablets given -see flowsheet.  Pt did pass a large amt of air t/o her time in the RR.  Abd discomfort is a "2" at discharge.

## 2016-04-22 ENCOUNTER — Telehealth: Payer: Self-pay | Admitting: Neurology

## 2016-04-22 ENCOUNTER — Telehealth: Payer: Self-pay

## 2016-04-22 NOTE — Telephone Encounter (Signed)
Patient called is calling in reference of disability paperwork she mailed to our office in mid January.  Please call

## 2016-04-22 NOTE — Telephone Encounter (Signed)
I have spoken with Crystal Huber this morning and explained that I have not received any disability paperwork for her in the mail.  She will request it be faxed to me at [phone]

## 2016-04-22 NOTE — Telephone Encounter (Signed)
  Follow up Call-  Call back number 04/19/2016  Post procedure Call Back phone  # [phone]  Permission to leave phone message Yes  Some recent data might be hidden     Patient questions:  Do you have a fever, pain , or abdominal swelling? Yes.   Pain Score  2 *  Have you tolerated food without any problems? Yes.    Have you been able to return to your normal activities? Yes.    Do you have any questions about your discharge instructions: Diet   No. Medications  No. Follow up visit  No.  Do you have questions or concerns about your Care? No.  Actions: * If pain score is 4 or above: No action needed, pain <4.   Pt verbalize having tenderness/ pain over the weekend in the right side where she said she had a polyp removed. Pt verbalize she just laid around over the weekend and took it easy. Pt verbalize she is feeling much better now. Rates her pain a 1-2 on a 0-10 pain scale. Advised to call back if having any more concerns.

## 2016-04-23 ENCOUNTER — Telehealth: Payer: Self-pay | Admitting: *Deleted

## 2016-04-23 NOTE — Telephone Encounter (Signed)
Disability form has been completed and faxed to [employer]  Copy has been mailed to pt's home address, for her records.  Copy has been sent to be scanned into EPIC/fim

## 2016-04-24 ENCOUNTER — Other Ambulatory Visit: Payer: Self-pay | Admitting: Neurology

## 2016-04-25 ENCOUNTER — Encounter: Payer: Self-pay | Admitting: Gastroenterology

## 2016-05-23 ENCOUNTER — Telehealth: Payer: Self-pay | Admitting: *Deleted

## 2016-05-23 MED ORDER — INTERFERON BETA-1A 22 MCG/0.5ML ~~LOC~~ SOSY: 22.0000 ug | PREFILLED_SYRINGE | SUBCUTANEOUS | 3 refills | Status: DC

## 2016-05-23 NOTE — Telephone Encounter (Signed)
I have spoken with Crystal Huber this morning, and Rebif rx. has been escribed to [facility] per her request/fim

## 2016-07-29 ENCOUNTER — Encounter: Payer: Self-pay | Admitting: Neurology

## 2016-07-29 ENCOUNTER — Ambulatory Visit (INDEPENDENT_AMBULATORY_CARE_PROVIDER_SITE_OTHER): Payer: Medicare Other | Admitting: Neurology

## 2016-07-29 VITALS — BP 184/88 | HR 57 | Resp 16 | Ht 64.0 in | Wt 145.0 lb

## 2016-07-29 DIAGNOSIS — R5383 Other fatigue: Secondary | ICD-10-CM

## 2016-07-29 DIAGNOSIS — F413 Other mixed anxiety disorders: Secondary | ICD-10-CM | POA: Diagnosis not present

## 2016-07-29 DIAGNOSIS — G500 Trigeminal neuralgia: Secondary | ICD-10-CM | POA: Diagnosis not present

## 2016-07-29 DIAGNOSIS — G35 Multiple sclerosis: Secondary | ICD-10-CM

## 2016-07-29 DIAGNOSIS — R269 Unspecified abnormalities of gait and mobility: Secondary | ICD-10-CM

## 2016-07-29 MED ORDER — BACLOFEN 10 MG PO TABS: 10.0000 mg | ORAL_TABLET | Freq: Three times a day (TID) | ORAL | 11 refills | Status: DC

## 2016-07-29 NOTE — Patient Instructions (Signed)
Continue Rebif and medications for trigeminal neuralgia.  Someone will call to schedule the MRI

## 2016-07-29 NOTE — Progress Notes (Signed)
[facility]  PATIENT: Crystal Huber DOB: May 26, 1945  REFERRING DOCTOR OR PCP:  [place]: Patient  _________________________________   HISTORICAL  CHIEF COMPLAINT:  Chief Complaint  Patient presents with  . Multiple Sclerosis    Sts. she continues to tolerate Rebif well.  Sts. facial pain has been stable for the last couple of years. She is using a cane today--sts. she fell a few weeks ago (got tangled in bathroom rug), fell onto her knees.  PCP saw her for same and did x-rays that were negative.)/fim    HISTORY OF PRESENT ILLNESS:  Crystal Huber is a 71 year old woman with MS diagnosed in 2002.  MS:   She feels her MS is stable.    She is on Rebif tiw  And misses about one shot a month.     She uses the Rebiject II and prefers not to change to the new autoinjector.   She tolerates Rebif well and she has not had recent exacerbation.    She has some skin reactions.     Trigeminal Neuralgia:  Her trigeminal neuralgia symptoms are doing much better on current regimen.     Pain is in the distribution of the V2 neurotome    She is taking 3 x  200 mg tegretols and 3 x 75 mg Lamictal and baclofen 10 mg nightly (too sleepy if taken during the day).   In 2016, the Sphenocath procedure helped when pain was much worse.     Gait/Strength/sensation:  Her gait is off balanced and she fell recently, hurting her leg.   The right leg was worse but since her fall, the left leg seems worse.  She walks inside the house and yard for exercise.       She denies any numbness but left leg seems slightly weaker  Bladder:  She has mild bladder urgency and rare stress incontinence.  She uses liners.   She also has mild hesitancy but feels she emptie.  . She denies any recent urinary tract infections.  Vision:   She denies any visual problems.  Fatigue:  She notes physical and cognitive fatigue, worse as the day goes on.  . Caffeine helps some.   She falls asleep easily on low dose Xanax.  Sleeps through the night.  Mood/cognition:      She denies depression and notes mild anxiety.    She has mild cognitive dysfunction and sometimes feels confused.   She notes verbal fluency issues and also has mild short-term memory issues at times.   She stays mentally and physically active.    She drinks one glass of wine most nights.    Other;   She had bronchitis and felt worse x 3 weeks.     MS History:   She had MRI and LP consistent with the diagnosis of MS in 2002 with trigeminal neuralgia.   She was started on Rebif.   She is on Rebif 22 mcg, tolerates it well and has had no definite exacerbation.  Last MRI was 2-3 years ago.    Her last MRI was 05/25/14 and showed no acute findings.    REVIEW OF SYSTEMS: Constitutional: No fevers, chills, sweats, or change in appetite.   Fatigue Eyes: No visual changes, double vision, eye pain Ear, nose and throat: No hearing loss, ear pain, nasal congestion, sore throat Cardiovascular: No chest pain, palpitations Respiratory: No shortness of breath at rest or with exertion.   No wheezes GastrointestinaI: No  nausea, vomiting, diarrhea, abdominal pain, fecal incontinence Genitourinary: No dysuria, urinary retention or frequency.  No nocturia. Musculoskeletal: No neck pain, back pain Integumentary: No rash, pruritus, skin lesions Neurological: as above Psychiatric: Some depression and  Mild anxiety Endocrine: No palpitations, diaphoresis, change in appetite, change in weigh or increased thirst Hematologic/Lymphatic: No anemia, purpura, petechiae..   Bruises easily Allergic/Immunologic: No itchy/runny eyes, nasal congestion, recent allergic reactions, rashes  ALLERGIES: Allergies  Allergen Reactions  . Metronidazole Nausea Only  . Penicillins Swelling    Yeast infection  . Statins     Gets hyponatremia and severe muscle pain  . Sulfa Antibiotics Swelling    yeast infection    HOME  MEDICATIONS:  Current Outpatient Prescriptions:  .  ALPRAZolam (XANAX) 0.5 MG tablet, Take 0.5 mg by mouth., Disp: , Rfl:  .  Alum Hydroxide-Mag Carbonate (GAVISCON PO), Take by mouth. As needed, Disp: , Rfl:  .  baclofen (LIORESAL) 10 MG tablet, Take 1 tablet (10 mg total) by mouth 3 (three) times daily., Disp: 90 tablet, Rfl: 11 .  buPROPion (WELLBUTRIN XL) 150 MG 24 hr tablet, Take 150 mg by mouth., Disp: , Rfl:  .  calcium citrate-vitamin D (CITRACAL+D) 315-200 MG-UNIT tablet, Take 1 tablet by mouth 2 (two) times daily., Disp: , Rfl:  .  carbamazepine (TEGRETOL) 200 MG tablet, TAKE 1 TABLET BY MOUTH 4 TIMES DAILY., Disp: 120 tablet, Rfl: 12 .  co-enzyme Q-10 30 MG capsule, Take 30 mg by mouth 3 (three) times daily., Disp: , Rfl:  .  docusate sodium (COLACE) 100 MG capsule, Take 100 mg by mouth every other day., Disp: , Rfl:  .  HYDROcodone-acetaminophen (NORCO/VICODIN) 5-325 MG tablet, Take 1 tablet by mouth every 6 (six) hours as needed for moderate pain. (Patient not taking: Reported on 02/22/2016), Disp: 30 tablet, Rfl: 0 .  hydrocortisone 2.5 % cream, Apply topically., Disp: , Rfl:  .  Interferon Beta-1a (REBIF) 22 MCG/0.5ML SOSY, Inject 22 mcg into the skin 3 (three) times a week., Disp: 36 Syringe, Rfl: 3 .  lactobacillus acidophilus (BACID) TABS tablet, Take 1 tablet by mouth daily., Disp: , Rfl:  .  lamoTRIgine (LAMICTAL) 150 MG tablet, Take 1 tablet (150 mg total) by mouth 3 (three) times daily., Disp: 90 tablet, Rfl: 6 .  latanoprost (XALATAN) 0.005 % ophthalmic solution, Place 1 drop into both eyes at bedtime., Disp: , Rfl:  .  meclizine (ANTIVERT) 25 MG tablet, , Disp: , Rfl:  .  olmesartan (BENICAR) 20 MG tablet, Take 20 mg by mouth., Disp: , Rfl:  .  Omega-3 Fatty Acids (FISH OIL PO), Take 1,400 mg by mouth daily., Disp: , Rfl:  .  vitamin B-12 (CYANOCOBALAMIN) 100 MCG tablet, Place under the tongue. Reported on 04/14/2015, Disp: , Rfl:   Current Facility-Administered  Medications:  .  0.9 %  sodium chloride infusion, 500 mL, Intravenous, Continuous, Nandigam, Venia Minks, MD  PAST MEDICAL HISTORY: Past Medical History:  Diagnosis Date  . Arthritis    right pinkie  . 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 Cataract    right eye  . GERD (gastroesophageal reflux disease)    prn tums, mild not often  . Glaucoma   . Hyperlipidemia   . Hypertension   . Multiple sclerosis ([place])   . Neuromuscular disorder (HCC)    trigeminal neuralgia  . Refusal of blood product     PAST SURGICAL HISTORY: Past Surgical History:  Procedure Laterality Date  .  BREAST LUMPECTOMY Right    wears prosthesis  . COLONOSCOPY  12/20/2010  . ganglion cyst removal Bilateral   . PARTIAL HYSTERECTOMY    . POLYPECTOMY      FAMILY HISTORY: Family History  Problem Relation Age of Onset  . Congestive Heart Failure Mother   . Heart attack Father   . Stroke Sister   . Breast cancer Sister   . Heart disease Brother   . Heart disease Sister   . Heart disease Brother   . Heart disease Brother   . Colon cancer Neg Hx   . Colon polyps Neg Hx     SOCIAL HISTORY:  Social History   Social History  . Marital status: Married    Spouse name: N/A  . Number of children: N/A  . Years of education: N/A   Occupational History  . Not on file.   Social History Main Topics  . Smoking status: Former Research scientist (life sciences)  . Smokeless tobacco: Never Used  . Alcohol use 0.0 oz/week     Comment: 1-2 glasses of wine at night/fim  . Drug use: No  . Sexual activity: Not on file   Other Topics Concern  . Not on file   Social History Narrative  . No narrative on file     PHYSICAL EXAM  Vitals:   07/29/16 1104  BP: (!) 184/88  Pulse: (!) 57  Resp: 16  Weight: 145 lb (65.8 kg)  Height: 5\' 4"  (1.626 m)    Body mass index is 24.89 kg/m.   General: The patient is well-developed and well-nourished and in no acute distress  Neck: The neck is supple.  The neck is nontender.  Neurologic  Exam  Mental status: The patient is alert and oriented x 3 at the time of the examination. The patient has apparent normal recent and remote memory, with an apparently normal attention span and concentration ability.   Speech is normal.  Cranial nerves: Extraocular movements are full.   Facial symmetry is present. Facial strength and sensation is normal. She has normal strength of the trapezius [place] mastoid muscles.. No dysarthria is noted.  The tongue is midline, and the patient has symmetric elevation of the soft palate. No obvious hearing deficits are noted.  Motor:  Muscle bulk is normal.   Tone is normal. Strength is  5 / 5 in all 4 extremities.   Sensory: Sensory testing is intact to touch and vibration  Coordination: Cerebellar testing reveals good finger-nose-finger .  Gait and station: Station is normal.   Gait is slightly wide. Tandem gait is poor and she needs to hold on..    Reflexes: Deep tendon reflexes are symmetric and normal bilaterally.       DIAGNOSTIC DATA (LABS, IMAGING, TESTING) - I reviewed patient records, labs, notes, testing and imaging myself where available.      ASSESSMENT AND PLAN  Multiple sclerosis ([place]) - Plan: MR BRAIN WO CONTRAST  Trigeminal neuralgia  Gait disorder  Other fatigue  Other mixed anxiety disorders   1.    Continue Rebif for MS.    check MRI of the brain without contrast and compare with previous MRI to see if there are any new lesions suggesting a subclinical progression. If present, consider a change in therapy. 2.   Continue Lamotrigine 75 mg po tid and Tegretol 200 mg po tid for Trigeminal neuralgia.   Baclofen prn 3.    Stay active and exercise as tolerated.    4.  rtc 4-6 months, sooner if problems  40 minute face to face evaluation with greater than one half of the time counseling or coordinating care about her MS and related symptoms.  Richard A. Felecia Shelling, MD, PhD 06/11/6145, 09:29 AM Certified in Neurology,  Clinical Neurophysiology, Sleep Medicine, Pain Medicine and Neuroimaging  [facility] [address] [place], [place] [address] [phone].

## 2016-07-31 ENCOUNTER — Telehealth: Payer: Self-pay | Admitting: Neurology

## 2016-07-31 MED ORDER — CARBAMAZEPINE 200 MG PO TABS: ORAL_TABLET | ORAL | 12 refills | Status: DC

## 2016-07-31 NOTE — Addendum Note (Signed)
Addended by: France Ravens I on: 07/31/2016 10:36 AM   Modules accepted: Orders

## 2016-07-31 NOTE — Telephone Encounter (Signed)
Rx. escribed as requested/fim 

## 2016-07-31 NOTE — Telephone Encounter (Addendum)
Patient called office requesting refill for carbamazepine (TEGRETOL) 200 MG tablet.  Crystal Huber

## 2016-08-13 ENCOUNTER — Ambulatory Visit: Payer: Self-pay | Admitting: Neurology

## 2016-08-19 ENCOUNTER — Other Ambulatory Visit: Payer: Medicare Other

## 2016-09-02 ENCOUNTER — Other Ambulatory Visit: Payer: Medicare Other

## 2016-09-19 ENCOUNTER — Ambulatory Visit
Admission: RE | Admit: 2016-09-19 | Discharge: 2016-09-19 | Disposition: A | Discharge status: Home / Self Care | Payer: Medicare Other | Source: Ambulatory Visit | Attending: Neurology | Admitting: Neurology

## 2016-09-19 DIAGNOSIS — G35 Multiple sclerosis: Secondary | ICD-10-CM | POA: Diagnosis not present

## 2016-09-21 ENCOUNTER — Encounter (HOSPITAL_BASED_OUTPATIENT_CLINIC_OR_DEPARTMENT_OTHER): Payer: Self-pay | Admitting: Emergency Medicine

## 2016-09-21 ENCOUNTER — Emergency Department (HOSPITAL_BASED_OUTPATIENT_CLINIC_OR_DEPARTMENT_OTHER)
Admission: EM | Admit: 2016-09-21 | Discharge: 2016-09-21 | Disposition: A | Discharge status: Home / Self Care | Payer: Medicare Other | Attending: Emergency Medicine | Admitting: Emergency Medicine

## 2016-09-21 ENCOUNTER — Telehealth: Payer: Self-pay | Admitting: Neurology

## 2016-09-21 DIAGNOSIS — E871 Hypo-osmolality and hyponatremia: Secondary | ICD-10-CM

## 2016-09-21 DIAGNOSIS — I160 Hypertensive urgency: Secondary | ICD-10-CM | POA: Diagnosis not present

## 2016-09-21 DIAGNOSIS — I10 Essential (primary) hypertension: Secondary | ICD-10-CM | POA: Diagnosis present

## 2016-09-21 DIAGNOSIS — Z853 Personal history of malignant neoplasm of breast: Secondary | ICD-10-CM | POA: Insufficient documentation

## 2016-09-21 DIAGNOSIS — Z87891 Personal history of nicotine dependence: Secondary | ICD-10-CM | POA: Insufficient documentation

## 2016-09-21 DIAGNOSIS — Z79899 Other long term (current) drug therapy: Secondary | ICD-10-CM | POA: Insufficient documentation

## 2016-09-21 LAB — CBC WITH DIFFERENTIAL/PLATELET
BASOS ABS: 0 10*3/uL (ref 0.0–0.1)
Basophils Relative: 1 %
EOS PCT: 1 %
Eosinophils Absolute: 0 10*3/uL (ref 0.0–0.7)
HEMATOCRIT: 35.2 % — AB (ref 36.0–46.0)
Hemoglobin: 12.7 g/dL (ref 12.0–15.0)
LYMPHS PCT: 24 %
Lymphs Abs: 1.2 10*3/uL (ref 0.7–4.0)
MCH: 34 pg (ref 26.0–34.0)
MCHC: 36.1 g/dL — AB (ref 30.0–36.0)
MCV: 94.4 fL (ref 78.0–100.0)
MONO ABS: 0.7 10*3/uL (ref 0.1–1.0)
MONOS PCT: 15 %
NEUTROS ABS: 3 10*3/uL (ref 1.7–7.7)
Neutrophils Relative %: 59 %
PLATELETS: 250 10*3/uL (ref 150–400)
RBC: 3.73 MIL/uL — ABNORMAL LOW (ref 3.87–5.11)
RDW: 13.1 % (ref 11.5–15.5)
WBC: 4.9 10*3/uL (ref 4.0–10.5)

## 2016-09-21 LAB — BASIC METABOLIC PANEL
Anion gap: 8 (ref 5–15)
BUN: 13 mg/dL (ref 6–20)
CALCIUM: 8.7 mg/dL — AB (ref 8.9–10.3)
CO2: 26 mmol/L (ref 22–32)
Chloride: 89 mmol/L — ABNORMAL LOW (ref 101–111)
Creatinine, Ser: 0.54 mg/dL (ref 0.44–1.00)
GLUCOSE: 103 mg/dL — AB (ref 65–99)
POTASSIUM: 4 mmol/L (ref 3.5–5.1)
Sodium: 123 mmol/L — ABNORMAL LOW (ref 135–145)

## 2016-09-21 NOTE — ED Provider Notes (Addendum)
[facility] DEPT [facility] Provider Note   CSN: [id number] Arrival date & time: 09/21/16  1944  By signing my name below, I, Mayer Masker, attest that this documentation has been prepared under the direction and in the presence of Sherwood Gambler, MD. Electronically Signed: Mayer Masker, Scribe. 09/21/16. 8:46 PM.  History   Chief Complaint Chief Complaint  Patient presents with  . Hypertension   The history is provided by the patient. No language interpreter was used.    HPI Comments: Crystal Huber is a 71 y.o. female with PMHx of HTN who presents to the Emergency Department complaining of constant, gradually worsening hypertension that began about a week ago. Pt has taken her BP several times PTA, with it being 195/90 at a [facility], 177/101 at a friend's house, and at home 195/90. She states 10 days ago she went to her PCP and her BP was 190/90 and they suggested she go to an urgent care to get it taken again. The provider at the UC added another HTN medication because her other ones "weren't working". She notes she takes 3 medications daily for HTN (metoprolol, hydralazine, and losartan) and her usual BP is 180/85. Other than the recent visit to UC, she denies any recent changes in medications. She reports HA but notes this might have been due to switching to new contacts. Pt is otherwise currently asymptomatic and denies dizziness, light-headedness, SOB, vomiting, weakness, CP, or any other pain.   Past Medical History:  Diagnosis Date  . Arthritis    right pinkie  . 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 Cataract    right eye  . GERD (gastroesophageal reflux disease)    prn tums, mild not often  . Glaucoma   . Hyperlipidemia   . Hypertension   . Multiple sclerosis ([place])   . Neuromuscular disorder ([facility])    trigeminal neuralgia  . Refusal of blood product     Patient Active Problem List   Diagnosis Date Noted  . Gait disorder 09/11/2015  . Urinary frequency 07/14/2014  . Trigeminal  neuralgia 05/16/2014  . Ingrown nail 12/13/2013  . Onychomycosis 12/13/2013  . Pain in lower limb 12/13/2013  . Multiple sclerosis ([place]) 09/12/2013  . Fatigue 09/12/2013  . LBP (low back pain) 09/12/2013  . Cannot sleep 09/12/2013  . Cephalalgia 09/12/2013  . Anxiety disorder 09/12/2013  . Arthralgia of shoulder 09/12/2013  . Extremity pain 09/12/2013  . Fothergill's neuralgia 09/12/2013  . Jerking gait 09/12/2013  . Avitaminosis D 09/12/2013  . Hypercholesterolemia 09/12/2013  . 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 BREAST LUMPECTOMY Right    wears prosthesis  . COLONOSCOPY  12/20/2010  . ganglion cyst removal Bilateral   . PARTIAL HYSTERECTOMY    . POLYPECTOMY      OB History    No data available       Home Medications    Prior to Admission medications   Medication Sig Start Date End Date Taking? Authorizing Provider  ALPRAZolam Duanne Moron) 0.5 MG tablet Take 0.5 mg by mouth. 01/23/16 01/22/17  [provider]  Alum Hydroxide-Mag Carbonate (GAVISCON PO) Take by mouth. As needed    [provider]  baclofen (LIORESAL) 10 MG tablet Take 1 tablet (10 mg total) by mouth 3 (three) times daily. 07/29/16   Sater, Nanine Means, MD  buPROPion (WELLBUTRIN XL) 150 MG 24 hr tablet Take  150 mg by mouth. 07/25/15 07/24/16  [provider]  calcium citrate-vitamin D (CITRACAL+D) 315-200 MG-UNIT tablet Take 1 tablet by mouth 2 (two) times daily.    [provider]  carbamazepine (TEGRETOL) 200 MG tablet TAKE 1 TABLET BY MOUTH 4 TIMES DAILY. 07/31/16   Sater, Nanine Means, MD  co-enzyme Q-10 30 MG capsule Take 30 mg by mouth 3 (three) times daily.    [provider]  docusate sodium (COLACE) 100 MG capsule Take 100 mg by mouth every other day.    [provider]  HYDROcodone-acetaminophen (NORCO/VICODIN) 5-325 MG tablet  Take 1 tablet by mouth every 6 (six) hours as needed for moderate pain. Patient not taking: Reported on 02/22/2016 02/12/16   Britt Bottom, MD  hydrocortisone 2.5 % cream Apply topically.    [provider]  Interferon Beta-1a (REBIF) 22 MCG/0.5ML SOSY Inject 22 mcg into the skin 3 (three) times a week. 05/24/16   Sater, Nanine Means, MD  lactobacillus acidophilus (BACID) TABS tablet Take 1 tablet by mouth daily.    [provider]  lamoTRIgine (LAMICTAL) 150 MG tablet Take 1 tablet (150 mg total) by mouth 3 (three) times daily. 02/12/16   Sater, Nanine Means, MD  latanoprost (XALATAN) 0.005 % ophthalmic solution Place 1 drop into both eyes at bedtime.    [provider]  meclizine (ANTIVERT) 25 MG tablet  08/04/15   [provider]  olmesartan (BENICAR) 20 MG tablet Take 20 mg by mouth. 09/16/13   [provider]  Omega-3 Fatty Acids (FISH OIL PO) Take 1,400 mg by mouth daily.    [provider]  vitamin B-12 (CYANOCOBALAMIN) 100 MCG tablet Place under the tongue. Reported on 04/14/2015    [provider]    Family History Family History  Problem Relation Age of Onset  . Congestive Heart Failure Mother   . Heart attack Father   . Stroke Sister   . Breast cancer Sister   . Heart disease Brother   . Heart disease Sister   . Heart disease Brother   . Heart disease Brother   . Colon cancer Neg Hx   . Colon polyps Neg Hx     Social History Social History  Substance Use Topics  . Smoking status: Former Research scientist (life sciences)  . Smokeless tobacco: Never Used  . Alcohol use 0.0 oz/week     Comment: 1-2 glasses of wine at night/fim     Allergies   Metronidazole; Penicillins; Statins; and Sulfa antibiotics   Review of Systems Review of Systems  Respiratory: Negative for shortness of breath.   Cardiovascular: Negative for chest pain.  Gastrointestinal: Negative for vomiting.  Neurological: Negative for dizziness, weakness and  light-headedness.  All other systems reviewed and are negative.    Physical Exam Updated Vital Signs BP (!) 239/119 (BP Location: Right Arm)   Pulse 76   Temp 98.4 F (36.9 C) (Oral)   Resp 18   Ht 5\' 3"  (1.6 m)   Wt 65.8 kg (145 lb)   SpO2 100%   BMI 25.69 kg/m   Physical Exam  Constitutional: She is oriented to person, place, and time. She appears well-developed and well-nourished.  HENT:  Head: Normocephalic and atraumatic.  Right Ear: External ear normal.  Left Ear: External ear normal.  Nose: Nose normal.  Eyes: EOM are normal. Pupils are equal, round, and reactive to light. Right eye exhibits no discharge. Left eye exhibits no discharge.  Cardiovascular: Normal rate, regular rhythm and normal  heart sounds.   Pulmonary/Chest: Effort normal and breath sounds normal.  Abdominal: Soft. There is no tenderness.  Neurological: She is alert and oriented to person, place, and time.  CN 3-12 grossly intact. 5/5 strength in all 4 extremities. Grossly normal sensation. Normal finger to nose.   Skin: Skin is warm and dry.  Nursing note and vitals reviewed.    ED Treatments / Results  DIAGNOSTIC STUDIES: Oxygen Saturation is 96% on RA, normal by my interpretation.    COORDINATION OF CARE: 8:29 PM Discussed treatment plan with pt at bedside and pt agreed to plan.  Labs (all labs ordered are listed, but only abnormal results are displayed) Labs Reviewed  BASIC METABOLIC PANEL - Abnormal; Notable for the following:       Result Value   Sodium 123 (*)    Chloride 89 (*)    Glucose, Bld 103 (*)    Calcium 8.7 (*)    All other components within normal limits  CBC WITH DIFFERENTIAL/PLATELET - Abnormal; Notable for the following:    RBC 3.73 (*)    HCT 35.2 (*)    MCHC 36.1 (*)    All other components within normal limits    EKG  EKG Interpretation None       Radiology No results found.  Procedures Procedures (including critical care time)  Medications Ordered  in ED Medications - No data to display   Initial Impression / Assessment and Plan / ED Course  I have reviewed the triage vital signs and the nursing notes.  Pertinent labs & imaging results that were available during my care of the patient were reviewed by me and considered in my medical decision making (see chart for details).     Patient is completely asymptomatic. Her blood pressure has fluctuated but she has remained without symptoms. Neuro exam unremarkable. No cardiac symptoms. Renal function at baseline. The only significant amount he is a recurrent low sodium of 123. She has had this multiple times before. Upon review of her medicines, Tegretol and Lamictal are the possible continued being factors. I discussed stopping these as these are not for seizures but more for neuropathic pain and trigeminal neuralgia. I discussed that if the sodium gets much lower she may need further treatment. However she has no symptoms incised think it is reasonable to have her follow-up close it with her doctor in 2 days. Otherwise her blood pressure has been elevated significantly for quite some time it seems. Given no symptoms I do not think emergent lowering would be beneficial. Discussed strict return precautions.  Final Clinical Impressions(s) / ED Diagnoses   Final diagnoses:  Hypertensive urgency  Hyponatremia    New Prescriptions New Prescriptions   No medications on file   I personally performed the services described in this documentation, which was scribed in my presence. The recorded information has been reviewed and is accurate.     Sherwood Gambler, MD 09/21/16 4166    Sherwood Gambler, MD 09/21/16 2149

## 2016-09-21 NOTE — ED Triage Notes (Signed)
Pt presents to ED with complaints of hypertension. Pt sts [facility] and it was 190/90 twice.

## 2016-09-21 NOTE — Telephone Encounter (Signed)
I spoke to [place].   She went to the the ED due to an elevated BP (190/90).   While there Chem panel checked and sodium was low at 123.     Of note, BP med's were changed last week.     I discussed tegretol can lower sodium as can some BP med's.   She should cut Tegretol down to 2 pills instead of 4 pills daily.     I will see her Monday afternoon  Faith, please place her on schedule for Monday

## 2016-09-21 NOTE — Discharge Instructions (Signed)
For now, stop taking tegretol (carbamazepine) and Lamictal (lamotrigine) as these can cause and worsen low sodium. Call you doctor for an appointment on 7/9 to be seen and have your sodium level reassessed. If you develop headaches, chest pain, weakness/numbness, confusion or other new/concerning symptoms, return to the ER immediately or  call 911 for evaluation

## 2016-09-21 NOTE — ED Notes (Signed)
MD aware of b/p at d/c. No further orders received.

## 2016-09-21 NOTE — ED Notes (Signed)
D/C instructions given at length to pt. Voiced understanding.

## 2016-09-23 ENCOUNTER — Telehealth: Payer: Self-pay | Admitting: *Deleted

## 2016-09-23 NOTE — Telephone Encounter (Signed)
I have spoken with [place] this afternoon and per RAS, reviewed MRI results as below.  She verbalized understanding of same, sts. she prefers to stay on Rebif./fim

## 2016-09-23 NOTE — Telephone Encounter (Signed)
See result note/fim 

## 2016-09-23 NOTE — Telephone Encounter (Signed)
Patient returning your call.

## 2016-09-23 NOTE — Telephone Encounter (Signed)
LMTC (identified vm)/fim 

## 2016-09-23 NOTE — Telephone Encounter (Signed)
-----   Message from Britt Bottom, MD sent at 09/20/2016  2:03 PM EDT ----- Please let her know that I looked at her MRI and compared it to the one from 6 years ago. In the interim, she does have some newer MS lesions though none were brand-new on the current study.   Since she has not had any clinical exacerbations, we can continue the Rebif but we could also consider a change to a different medication if she prefers.

## 2016-11-05 ENCOUNTER — Telehealth: Payer: Self-pay | Admitting: *Deleted

## 2016-11-05 NOTE — Telephone Encounter (Signed)
Disability Paperwork completed, mailed to [place]: Claims Dept. [id number], [place] [address], per pt's request/fim

## 2016-12-18 ENCOUNTER — Telehealth: Payer: Self-pay | Admitting: Neurology

## 2016-12-18 NOTE — Telephone Encounter (Signed)
Pt is asking for a call back to discuss her lamoTRIgine (LAMICTAL) 150 MG tablet & carbamazepine (TEGRETOL) 200 MG tablet please call

## 2016-12-18 NOTE — Telephone Encounter (Signed)
I have spoken with Crystal Huber this afternoon. She sts. she has had some breakthru facial pain, is currently taking 1/2 carbamazepine tid and 3/4 tab qhs and this is controlling pain.  This is still less than rx'd.  She is aware to call if she has pain not controlled by rx'd amt. of carbamazepine and lamictal/fim

## 2017-01-02 ENCOUNTER — Telehealth: Payer: Self-pay | Admitting: Neurology

## 2017-01-02 NOTE — Telephone Encounter (Signed)
Karen/[facility] [phone] called needing to know if pt is still on rebif. She has been trying to get in touch with the pt with no success. She confirmed pt's address and phone number. Please call to discuss

## 2017-01-02 NOTE — Telephone Encounter (Signed)
I have spoken with Crystal Huber this morning and confirmed that pt. is still on Rebif./fim

## 2017-01-29 ENCOUNTER — Ambulatory Visit: Payer: Medicare Other | Admitting: Neurology

## 2017-01-29 ENCOUNTER — Encounter: Payer: Self-pay | Admitting: Neurology

## 2017-01-29 VITALS — BP 144/77 | HR 61 | Resp 14 | Ht 64.0 in | Wt 145.0 lb

## 2017-01-29 DIAGNOSIS — R350 Frequency of micturition: Secondary | ICD-10-CM

## 2017-01-29 DIAGNOSIS — G35 Multiple sclerosis: Secondary | ICD-10-CM

## 2017-01-29 DIAGNOSIS — R269 Unspecified abnormalities of gait and mobility: Secondary | ICD-10-CM

## 2017-01-29 DIAGNOSIS — G500 Trigeminal neuralgia: Secondary | ICD-10-CM

## 2017-01-29 MED ORDER — HYDROCODONE-ACETAMINOPHEN 5-325 MG PO TABS: 1.0000 | ORAL_TABLET | Freq: Four times a day (QID) | ORAL | 0 refills | Status: DC | PRN

## 2017-01-29 NOTE — Patient Instructions (Signed)
Take Lamotrigine 150 mg three times a day.   That is one pill 3 times a day  Take Carbamezapine (Tegretol) 100 mg three times a day.   That is one half pill 3 times a day

## 2017-01-29 NOTE — Progress Notes (Signed)
[facility]  PATIENT: Crystal Huber DOB: [birth year]  REFERRING DOCTOR OR PCP:  Vedia Coffer SOURCE: Patient  _________________________________   HISTORICAL  CHIEF COMPLAINT:  No chief complaint on file.   HISTORY OF PRESENT ILLNESS:  Crystal Huber is a 71 year old woman with MS diagnosed in 2002.  Update 01/29/2017:   She feels her MS is stable on Rebif.    She tolerates it well and has no concerning side effects.   She feels gait, strength, sensation are all baseline.    Her balance is mildly off but this is mostly stable and she holds on with stairs.    Vision is fine.  Bladder function is ok most of the time but there is sometimes hesitancy.     She notes mile fatigue and mild cognitive issues.   She had HTN and med's were changed.   She was told to cut down om the Tegretol (and she cut dose in half).   For her TN, she is taking lamotrigine 75 po tid (was 3 x 150 mg before July) and Tegretol 100 mg tid (was 200 mg po tid).   Her last levels on the old dose showed low lamotrigine (2.2) and therapeutic CBZ (6.6).      From 02/11/2017:  MS:   She feels mostly stable.    She is on Rebif.   She uses the Rebiject II and prefers not to change to the new autoinjector.   She tolerates Rebif well and she has not had recent exacerbation.    She has some skin reactions  Trigeminal Neuralgia:  This is doing ok again .   Pain is in the distribution of the V2 neurotome    She is taking 3 x  200 mg tegretols and 3 x 150 mg Lamictal and baclofen 10 mg nightly (too sleepy if taken during the day).   When it was severe in 2016, the Sphenocath procedure helped.   She has less dizziness on lower dose of  Lamotrigine .   Meclizine also helps the dizziness  Gait/Strength/sensation:  Gait is mildly of balanced. She needs to hold onto a side-rail with steps and sometimes holds the furniture or walls.    She has not fallen recently.  She denies any  numbness or weakness in her legs.   Bladder:  She notes a little bit of bladder urgency at times and will have rare stress incontinence. She also has mild hesitancy but feels she emptie.  . She denies any recent urinary tract infections.  Vision:   She denies any visual problems.  Fatigue:  She notes  fatigue is stable but worsens as the day goes on.  This is better since reducing Tegretol from 4 to 3 pills    This is both physical and cognitive. Caffeine helps some.  She falls asleep easily on low dose Xanax.    Mood/cognition:      She denies any significant depression.    Her anxiety is mild most days.    She notes mild cognitive dysfunction and has noted some verbal fluency issues and also has mild short-term memory issues at times.    She drinks one glass of wine most nights.     MS History:   She had MRI and LP consistent with the diagnosis of MS in 2002 with trigeminal neuralgia.   She was started on Rebif.   She is on Rebif 22 mcg, tolerates it well and has had no definite  exacerbation.  Last MRI was 2-3 years ago.    Her last MRI was 05/25/14 and showed no acute findings.    REVIEW OF SYSTEMS: Constitutional: No fevers, chills, sweats, or change in appetite.   Fatigue Eyes: No visual changes, double vision, eye pain Ear, nose and throat: No hearing loss, ear pain, nasal congestion, sore throat Cardiovascular: No chest pain, palpitations Respiratory: No shortness of breath at rest or with exertion.   No wheezes GastrointestinaI: No nausea, vomiting, diarrhea, abdominal pain, fecal incontinence Genitourinary: No dysuria, urinary retention or frequency.  No nocturia. Musculoskeletal: No neck pain, back pain Integumentary: No rash, pruritus, skin lesions Neurological: as above Psychiatric: Some depression and  Mild anxiety Endocrine: No palpitations, diaphoresis, change in appetite, change in weigh or increased thirst Hematologic/Lymphatic: No anemia, purpura, petechiae..   Bruises  easily Allergic/Immunologic: No itchy/runny eyes, nasal congestion, recent allergic reactions, rashes  ALLERGIES: Allergies  Allergen Reactions  . Metronidazole Nausea Only  . Penicillins Swelling    Yeast infection  . Statins     Gets hyponatremia and severe muscle pain  . Sulfa Antibiotics Swelling    yeast infection    HOME MEDICATIONS:  Current Outpatient Medications:  .  ALPRAZolam (XANAX) 0.5 MG tablet, Take 0.5 mg by mouth., Disp: , Rfl:  .  Alum Hydroxide-Mag Carbonate (GAVISCON PO), Take by mouth. As needed, Disp: , Rfl:  .  baclofen (LIORESAL) 10 MG tablet, Take 1 tablet (10 mg total) by mouth 3 (three) times daily., Disp: 90 tablet, Rfl: 11 .  calcium citrate-vitamin D (CITRACAL+D) 315-200 MG-UNIT tablet, Take 1 tablet by mouth 2 (two) times daily., Disp: , Rfl:  .  carbamazepine (TEGRETOL) 200 MG tablet, TAKE 1 TABLET BY MOUTH 4 TIMES DAILY., Disp: 120 tablet, Rfl: 12 .  citalopram (CELEXA) 20 MG tablet, TAKE ONE TABLET BY MOUTH DAILY, Disp: , Rfl:  .  co-enzyme Q-10 30 MG capsule, Take 30 mg by mouth 3 (three) times daily., Disp: , Rfl:  .  hydrALAZINE (APRESOLINE) 50 MG tablet, Take 50 mg by mouth., Disp: , Rfl:  .  HYDROcodone-acetaminophen (NORCO/VICODIN) 5-325 MG tablet, Take 1 tablet by mouth every 6 (six) hours as needed for moderate pain., Disp: 30 tablet, Rfl: 0 .  hydrocortisone 2.5 % cream, Apply topically., Disp: , Rfl:  .  Interferon Beta-1a (REBIF) 22 MCG/0.5ML SOSY, Inject 22 mcg into the skin 3 (three) times a week., Disp: 36 Syringe, Rfl: 3 .  lactobacillus acidophilus (BACID) TABS tablet, Take 1 tablet by mouth daily., Disp: , Rfl:  .  lamoTRIgine (LAMICTAL) 150 MG tablet, Take 1 tablet (150 mg total) by mouth 3 (three) times daily., Disp: 90 tablet, Rfl: 6 .  latanoprost (XALATAN) 0.005 % ophthalmic solution, Place 1 drop into both eyes at bedtime., Disp: , Rfl:  .  losartan (COZAAR) 100 MG tablet, TAKE ONE TABLET BY MOUTH DAILY, Disp: , Rfl:  .   meclizine (ANTIVERT) 25 MG tablet, , Disp: , Rfl:  .  metoprolol succinate (TOPROL-XL) 50 MG 24 hr tablet, Take 50 mg by mouth., Disp: , Rfl:  .  Omega-3 Fatty Acids (FISH OIL PO), Take 1,400 mg by mouth daily., Disp: , Rfl:  .  omeprazole (PRILOSEC) 40 MG capsule, Take 40 mg by mouth., Disp: , Rfl:  .  vitamin B-12 (CYANOCOBALAMIN) 100 MCG tablet, Place under the tongue. Reported on 04/14/2015, Disp: , Rfl:  .  buPROPion (WELLBUTRIN XL) 150 MG 24 hr tablet, Take 150 mg by mouth., Disp: , Rfl:  .  docusate sodium (COLACE) 100 MG capsule, Take 100 mg by mouth every other day., Disp: , Rfl:  .  olmesartan (BENICAR) 20 MG tablet, Take 20 mg by mouth., Disp: , Rfl:   Current Facility-Administered Medications:  .  0.9 %  sodium chloride infusion, 500 mL, Intravenous, Continuous, Nandigam, Venia Minks, MD  PAST MEDICAL HISTORY: Past Medical History:  Diagnosis Date  . Arthritis    right pinkie  . Cancer ([place])    right BR  CA   . Cataract    right eye  . GERD (gastroesophageal reflux disease)    prn tums, mild not often  . Glaucoma   . Hyperlipidemia   . Hypertension   . Multiple sclerosis ([place])   . Neuromuscular disorder ([facility])    trigeminal neuralgia  . Refusal of blood product     PAST SURGICAL HISTORY: Past Surgical History:  Procedure Laterality Date  . BREAST LUMPECTOMY Right    wears prosthesis  . COLONOSCOPY  12/20/2010  . ganglion cyst removal Bilateral   . PARTIAL HYSTERECTOMY    . POLYPECTOMY      FAMILY HISTORY: Family History  Problem Relation Age of Onset  . Congestive Heart Failure Mother   . Heart attack Father   . Stroke Sister   . Breast cancer Sister   . Heart disease Brother   . Heart disease Sister   . Heart disease Brother   . Heart disease Brother   . Colon cancer Neg Hx   . Colon polyps Neg Hx     SOCIAL HISTORY:  Social History   Socioeconomic History  . Marital status: Married    Spouse name: Not on file  . Number of children: Not on  file  . Years of education: Not on file  . Highest education level: Not on file  Social Needs  . Financial resource strain: Not on file  . Food insecurity - worry: Not on file  . Food insecurity - inability: Not on file  . Transportation needs - medical: Not on file  . Transportation needs - non-medical: Not on file  Occupational History  . Not on file  Tobacco Use  . Smoking status: Former Research scientist (life sciences)  . Smokeless tobacco: Never Used  Substance and Sexual Activity  . Alcohol use: Yes    Alcohol/week: 0.0 oz    Comment: 1-2 glasses of wine at night/fim  . Drug use: No  . Sexual activity: Not on file  Other Topics Concern  . Not on file  Social History Narrative  . Not on file     PHYSICAL EXAM  Vitals:   01/29/17 1126  BP: (!) 144/77  Pulse: 61  Resp: 14  Weight: 145 lb (65.8 kg)  Height: 5\' 4"  (1.626 m)    Body mass index is 24.89 kg/m.   General: The patient is well-developed and well-nourished and in no acute distress  Neck: The neck is supple.  The neck is nontender.  Neurologic Exam  Mental status: The patient is alert and oriented x 3 at the time of the examination. The patient has apparent normal recent and remote memory, with an apparently normal attention span and concentration ability.   Speech is normal.  Cranial nerves: Extraocular movements are full.  Facial strength and sensation are normal    Trapezius strength is normal. . No dysarthria is noted.  The tongue is midline, and the patient has symmetric elevation of the soft palate. No obvious hearing deficits are noted.  Motor:  Muscle bulk is normal.   Tone is normal. Strength is  5 / 5 in all 4 extremities.   Sensory: Sensory testing is intact to touch and vibration  Coordination: Cerebellar testing reveals good finger-nose-finger .  Gait and station: Station is normal.   Gait is minimally wide and tandem gait is moderately wide.   Romberg is negative...    Reflexes: Deep tendon reflexes are  symmetric and normal bilaterally.       DIAGNOSTIC DATA (LABS, IMAGING, TESTING) - I reviewed patient records, labs, notes, testing and imaging myself where available.      ASSESSMENT AND PLAN  Multiple sclerosis ([facility])  Trigeminal neuralgia  Gait disorder  Urinary frequency   1.    Continue Rebif for MS.   2.    Lamotrigine increase to 150 mg po tid and Tegretol remain on 100 mg po tid for Trigeminal neuralgia.   Baclofen prn.      Check BMP and CBZ level 3.    Stay active and exercise as tolerated.    4.    rtc 4-6 months, sooner if problems   Richard A. Felecia Shelling, MD, PhD 16/94/5038, 88:28 AM Certified in Neurology, Clinical Neurophysiology, Sleep Medicine, Pain Medicine and Neuroimaging  [facility] [address] [place], [place] [address] [phone]

## 2017-01-30 ENCOUNTER — Telehealth: Payer: Self-pay | Admitting: *Deleted

## 2017-01-30 LAB — BASIC METABOLIC PANEL
BUN/Creatinine Ratio: 29 — ABNORMAL HIGH (ref 12–28)
BUN: 16 mg/dL (ref 8–27)
CO2: 26 mmol/L (ref 20–29)
Calcium: 8.9 mg/dL (ref 8.7–10.3)
Chloride: 93 mmol/L — ABNORMAL LOW (ref 96–106)
Creatinine, Ser: 0.55 mg/dL — ABNORMAL LOW (ref 0.57–1.00)
GFR, EST AFRICAN AMERICAN: 109 mL/min/{1.73_m2} (ref >59)
GFR, EST NON AFRICAN AMERICAN: 95 mL/min/{1.73_m2} (ref >59)
Glucose: 103 mg/dL — ABNORMAL HIGH (ref 65–99)
POTASSIUM: 4.5 mmol/L (ref 3.5–5.2)
SODIUM: 131 mmol/L — AB (ref 134–144)

## 2017-01-30 LAB — CARBAMAZEPINE LEVEL, TOTAL: Carbamazepine (Tegretol), S: 7 ug/mL (ref 4.0–12.0)

## 2017-01-30 NOTE — Telephone Encounter (Signed)
Spoke with Claire this am and reviewed below lab results with her, rec. to continue Tegretol at current dose of 100mg  tid. She verbalized understanding of same/fim

## 2017-01-30 NOTE — Telephone Encounter (Signed)
-----   Message from Britt Bottom, MD sent at 01/30/2017  8:51 AM EST ----- Please let her know that the sodium is better and is just mildly below now (131 now-was 123).   The Tegretol level is ok and she should remain on the dose that she is currently taking

## 2017-02-10 ENCOUNTER — Other Ambulatory Visit: Payer: Self-pay | Admitting: Neurology

## 2017-03-07 ENCOUNTER — Telehealth: Payer: Self-pay | Admitting: Neurology

## 2017-03-07 MED ORDER — INTERFERON BETA-1A 22 MCG/0.5ML ~~LOC~~ SOSY: 22.0000 ug | PREFILLED_SYRINGE | SUBCUTANEOUS | 3 refills | Status: DC

## 2017-03-07 NOTE — Telephone Encounter (Signed)
Per [facility], no pa needed for Rebif. Ref# for call is [id number].  Spoke with pt. and made her aware.  New rx. escribed to [facility] per her request/fim

## 2017-03-07 NOTE — Telephone Encounter (Signed)
Pt calling Interferon Beta-1a (REBIF) 22 MCG/0.5ML SOSY needs PA, [facility] her. Pt said she really does not know what is needed, she said RN would know, it has to be done every year. Pt is aware the clinic closes at noon today and does not reopen until Wednesday of next week.

## 2017-03-20 ENCOUNTER — Telehealth: Payer: Self-pay | Admitting: Neurology

## 2017-03-20 NOTE — Telephone Encounter (Signed)
Spoke with Bethena Roys and per RAS, advised she may increase Tegretol from 100mg  tid to 200mg  bid.  She verbalized understanding of same, is agreeable with plan/fim

## 2017-03-20 NOTE — Telephone Encounter (Signed)
Pt called she is taking lamoTRIgine (LAMICTAL) 150 MG tablet tid andcarbamazepine (TEGRETOL) 200 MG tablet 1/2 tab as instructed per last OV 11/14 per the pt. She is having a lot of break thru pain. Please call to advise what she can do.

## 2017-03-26 ENCOUNTER — Telehealth: Payer: Self-pay | Admitting: Neurology

## 2017-03-26 NOTE — Telephone Encounter (Signed)
LMTC (identified vm)/fim 

## 2017-03-26 NOTE — Telephone Encounter (Signed)
Pt has called re: her Trigeminal neuralgia, pt states she was on a web site www.nanovidronix(pain shield) she wants to know if Dr Felecia Shelling has heard of it re: relief of pain.  Pt would like a call to discuss, she states she is not trying to over ride his treatment plan by any means.  Please call

## 2017-03-27 NOTE — Telephone Encounter (Signed)
Spoke with Crystal Huber and advised that per RAS, ok to try this device, which appears to be a tens unit.  She does not need to stop any medication.  She verbalized understanding of same, sts. unit will only cost her $40, so she believes she will try it/fim

## 2017-03-27 NOTE — Telephone Encounter (Signed)
Pt returned RN's call °

## 2017-05-13 ENCOUNTER — Telehealth: Payer: Self-pay | Admitting: Neurology

## 2017-05-13 NOTE — Telephone Encounter (Signed)
I left a message for patient to call back. What is her blood pressure and is she having any symptoms? And is she asking if [name] wants her to see a new PCP?

## 2017-05-13 NOTE — Telephone Encounter (Signed)
Pt called back stating the main reason she sees [name] is for her dizziness and has been to an ENT already. FYI

## 2017-05-13 NOTE — Telephone Encounter (Signed)
I spoke with patient and she states that she has been having frequent dizzy spells for about a month or two. She says that it happens when she gets up too fast or even sometimes when she's driving and turns to look behind her. She says that she has seen the ENT and they have r/o vertigo or any inner ear issues. She checks her BP at home and says that it typically runs around 150/90-something. She wonders if [name] has any idea as to what she should do or who she should see.

## 2017-05-13 NOTE — Telephone Encounter (Signed)
Patient is returning your call.  

## 2017-05-13 NOTE — Telephone Encounter (Signed)
MS rep told Crystal Huber to called PCP or [name] regarding her high blood pressure, but once reached out to her pcp "she had high blood pressure to not worry about it" Crystal Huber is wanting a call back to see if there is a certain pcp [name] would prefer and to discuss a few things not wanting to tell me

## 2017-05-13 NOTE — Telephone Encounter (Signed)
I spoke with patient and she agrees to follow up with [name]. OV scheduled and she is aware to call if anything changes or worsens.

## 2017-05-19 ENCOUNTER — Ambulatory Visit: Payer: Medicare Other | Admitting: Neurology

## 2017-05-19 ENCOUNTER — Other Ambulatory Visit: Payer: Self-pay

## 2017-05-19 ENCOUNTER — Encounter: Payer: Self-pay | Admitting: Neurology

## 2017-05-19 VITALS — BP 142/70 | HR 68 | Resp 18 | Ht 64.0 in

## 2017-05-19 DIAGNOSIS — R42 Dizziness and giddiness: Secondary | ICD-10-CM | POA: Insufficient documentation

## 2017-05-19 DIAGNOSIS — G500 Trigeminal neuralgia: Secondary | ICD-10-CM

## 2017-05-19 DIAGNOSIS — G35D Multiple sclerosis, unspecified: Secondary | ICD-10-CM

## 2017-05-19 DIAGNOSIS — G35 Multiple sclerosis: Secondary | ICD-10-CM | POA: Diagnosis not present

## 2017-05-19 DIAGNOSIS — H9192 Unspecified hearing loss, left ear: Secondary | ICD-10-CM | POA: Diagnosis not present

## 2017-05-19 NOTE — Progress Notes (Signed)
[facility]  PATIENT: Crystal Huber DOB: [birth year]  REFERRING DOCTOR OR PCP:  [place]: Patient  _________________________________   HISTORICAL  CHIEF COMPLAINT:  Chief Complaint  Patient presents with  . Multiple Sclerosis    Sts. she continues to tolerate Rebif well.  Sts. facial pain currently well controlled with Tegretol 200mg  QAM, QHS, and occ. 1/2 tab during the day, and Lamictal 150mg  TID/fim  . Trigeminal Neuralgia    HISTORY OF PRESENT ILLNESS:  Crystal Huber is a 72 year old woman with MS diagnosed in 2002.  Update 05/19/2017:  For the most part, she feels stable and continues on Rebif.    In the mornings, she feels lightheaded and off balanced and sometimes needs to hold on.   She usually lays back down.   She makes sure that she waits a little before walking after she stands up.   She does not feels like she is going to pass out.   Some days, this does not occur and some days it may occur later in the day.   This has been occurring for several months but worse the past month.   She saw ENT and was told she has some left hearing loss and that dizziness may be form Eustachian tube dysfunction.    A steroid shot did not help much.    Of note, last MRI mid 2018 did not show any mastoid effusions and no evidence of schwannoma.    She is on 3 blood pressure medications including hydralazine, metoprolol and an ARB   Her Trigeminal Neuralgia is doing well.  She is on CBZ bid and lamotrigine tid.    This regimen has helped her and she will take an extra CBZ if it flares up any.   No major flares lately.   She has mild bladder urgency.     Update 01/29/2017:   She feels her MS is stable on Rebif.    She tolerates it well and has no concerning side effects.   She feels gait, strength, sensation are all baseline.    Her balance is mildly off but this is mostly stable and she holds on with stairs.    Vision is fine.   Bladder function is ok most of the time but there is sometimes hesitancy.     She notes mile fatigue and mild cognitive issues.   She had HTN and med's were changed.   She was told to cut down om the Tegretol (and she cut dose in half).   For her TN, she is taking lamotrigine 75 po tid (was 3 x 150 mg before July) and Tegretol 100 mg tid (was 200 mg po tid).   Her last levels on the old dose showed low lamotrigine (2.2) and therapeutic CBZ (6.6).      From 02/11/2017: MS:   She feels mostly stable.    She is on Rebif.   She uses the Rebiject II and prefers not to change to the new autoinjector.   She tolerates Rebif well and she has not had recent exacerbation.    She has some skin reactions  Trigeminal Neuralgia:  This is doing ok again .   Pain is in the distribution of the V2 neurotome    She is taking 3 x  200 mg tegretols and 3 x 150 mg Lamictal and baclofen 10 mg nightly (too sleepy if taken during the day).   When it was severe in 2016, the [place]  procedure helped.   She has less dizziness on lower dose of  Lamotrigine .   Meclizine also helps the dizziness  Gait/Strength/sensation:  Gait is mildly of balanced. She needs to hold onto a side-rail with steps and sometimes holds the furniture or walls.    She has not fallen recently.  She denies any numbness or weakness in her legs.   Bladder:  She notes a little bit of bladder urgency at times and will have rare stress incontinence. She also has mild hesitancy but feels she emptie.  . She denies any recent urinary tract infections.  Vision:   She denies any visual problems.  Fatigue:  She notes  fatigue is stable but worsens as the day goes on.  This is better since reducing Tegretol from 4 to 3 pills    This is both physical and cognitive. Caffeine helps some.  She falls asleep easily on low dose Xanax.    Mood/cognition:      She denies any significant depression.    Her anxiety is mild most days.    She notes mild cognitive dysfunction  and has noted some verbal fluency issues and also has mild short-term memory issues at times.    She drinks one glass of wine most nights.     MS History:   She had MRI and LP consistent with the diagnosis of MS in 2002 with trigeminal neuralgia.   She was started on Rebif.   She is on Rebif 22 mcg, tolerates it well and has had no definite exacerbation.  Last MRI was 2-3 years ago.    Her last MRI was 05/25/14 and showed no acute findings.    REVIEW OF SYSTEMS: Constitutional: No fevers, chills, sweats, or change in appetite.   Fatigue Eyes: No visual changes, double vision, eye pain Ear, nose and throat: No hearing loss, ear pain, nasal congestion, sore throat Cardiovascular: No chest pain, palpitations Respiratory: No shortness of breath at rest or with exertion.   No wheezes GastrointestinaI: No nausea, vomiting, diarrhea, abdominal pain, fecal incontinence Genitourinary: No dysuria, urinary retention or frequency.  No nocturia. Musculoskeletal: No neck pain, back pain Integumentary: No rash, pruritus, skin lesions Neurological: as above Psychiatric: Some depression and  Mild anxiety Endocrine: No palpitations, diaphoresis, change in appetite, change in weigh or increased thirst Hematologic/Lymphatic: No anemia, purpura, petechiae..   Bruises easily Allergic/Immunologic: No itchy/runny eyes, nasal congestion, recent allergic reactions, rashes  ALLERGIES: Allergies  Allergen Reactions  . Metronidazole Nausea Only  . Penicillins Swelling    Yeast infection  . Statins     Gets hyponatremia and severe muscle pain  . Sulfa Antibiotics Swelling    yeast infection    HOME MEDICATIONS:  Current Outpatient Medications:  .  Alum Hydroxide-Mag Carbonate (GAVISCON PO), Take by mouth. As needed, Disp: , Rfl:  .  baclofen (LIORESAL) 10 MG tablet, Take 1 tablet (10 mg total) by mouth 3 (three) times daily., Disp: 90 tablet, Rfl: 11 .  calcium citrate-vitamin D (CITRACAL+D) 315-200 MG-UNIT  tablet, Take 1 tablet by mouth 2 (two) times daily., Disp: , Rfl:  .  carbamazepine (TEGRETOL) 200 MG tablet, TAKE 1 TABLET BY MOUTH 4 TIMES DAILY., Disp: 120 tablet, Rfl: 12 .  citalopram (CELEXA) 20 MG tablet, TAKE ONE TABLET BY MOUTH DAILY, Disp: , Rfl:  .  co-enzyme Q-10 30 MG capsule, Take 30 mg by mouth 3 (three) times daily., Disp: , Rfl:  .  hydrALAZINE (APRESOLINE) 50 MG tablet, Take 50  mg by mouth., Disp: , Rfl:  .  HYDROcodone-acetaminophen (NORCO/VICODIN) 5-325 MG tablet, Take 1 tablet every 6 (six) hours as needed by mouth for moderate pain., Disp: 30 tablet, Rfl: 0 .  hydrocortisone 2.5 % cream, Apply topically., Disp: , Rfl:  .  Interferon Beta-1a (REBIF) 22 MCG/0.5ML SOSY, Inject 22 mcg into the skin 3 (three) times a week., Disp: 36 Syringe, Rfl: 3 .  lactobacillus acidophilus (BACID) TABS tablet, Take 1 tablet by mouth daily., Disp: , Rfl:  .  lamoTRIgine (LAMICTAL) 150 MG tablet, TAKE 1 TABLET (150 MG TOTAL) BY MOUTH 3 (THREE)TIMES DAILY., Disp: 90 tablet, Rfl: 5 .  latanoprost (XALATAN) 0.005 % ophthalmic solution, Place 1 drop into both eyes at bedtime., Disp: , Rfl:  .  losartan (COZAAR) 100 MG tablet, TAKE ONE TABLET BY MOUTH DAILY, Disp: , Rfl:  .  meclizine (ANTIVERT) 25 MG tablet, , Disp: , Rfl:  .  metoprolol succinate (TOPROL-XL) 50 MG 24 hr tablet, Take 50 mg by mouth., Disp: , Rfl:  .  olmesartan (BENICAR) 20 MG tablet, Take 20 mg by mouth., Disp: , Rfl:  .  Omega-3 Fatty Acids (FISH OIL PO), Take 1,400 mg by mouth daily., Disp: , Rfl:  .  omeprazole (PRILOSEC) 40 MG capsule, Take 40 mg by mouth., Disp: , Rfl:  .  vitamin B-12 (CYANOCOBALAMIN) 100 MCG tablet, Place under the tongue. Reported on 04/14/2015, Disp: , Rfl:  .  ALPRAZolam (XANAX) 0.5 MG tablet, Take 0.5 mg by mouth., Disp: , Rfl:  .  buPROPion (WELLBUTRIN XL) 150 MG 24 hr tablet, Take 150 mg by mouth., Disp: , Rfl:  .  docusate sodium (COLACE) 100 MG capsule, Take 100 mg by mouth every other day., Disp: ,  Rfl:   Current Facility-Administered Medications:  .  0.9 %  sodium chloride infusion, 500 mL, Intravenous, Continuous, Nandigam, Venia Minks, MD  PAST MEDICAL HISTORY: Past Medical History:  Diagnosis Date  . Arthritis    right pinkie  . 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 Cataract    right eye  . GERD (gastroesophageal reflux disease)    prn tums, mild not often  . Glaucoma   . Hyperlipidemia   . Hypertension   . Multiple sclerosis ([place])   . Neuromuscular disorder (H[facility])    trigeminal neuralgia  . Refusal of blood product     PAST SURGICAL HISTORY: Past Surgical History:  Procedure Laterality Date  . BREAST LUMPECTOMY Right    wears prosthesis  . COLONOSCOPY  12/20/2010  . ganglion cyst removal Bilateral   . PARTIAL HYSTERECTOMY    . POLYPECTOMY      FAMILY HISTORY: Family History  Problem Relation Age of Onset  . Congestive Heart Failure Mother   . Heart attack Father   . Stroke Sister   . Breast cancer Sister   . Heart disease Brother   . Heart disease Sister   . Heart disease Brother   . Heart disease Brother   . Colon cancer Neg Hx   . Colon polyps Neg Hx     SOCIAL HISTORY:  Social History   Socioeconomic History  . Marital status: Married    Spouse name: Not on file  . Number of children: Not on file  . Years of education: Not on file  . Highest education level: Not on file  Social Needs  . Financial resource strain: Not on file  . Food insecurity - worry: Not on file  .  Food insecurity - inability: Not on file  . Transportation needs - medical: Not on file  . Transportation needs - non-medical: Not on file  Occupational History  . Not on file  Tobacco Use  . Smoking status: Former Research scientist (life sciences)  . Smokeless tobacco: Never Used  Substance and Sexual Activity  . Alcohol use: Yes    Alcohol/week: 0.0 oz    Comment: 1-2 glasses of wine at night/fim  . Drug use: No  . Sexual activity: Not on file  Other Topics Concern  . Not on file  Social  History Narrative  . Not on file     PHYSICAL EXAM  Vitals:   05/19/17 1415  BP: (!) 142/70  Pulse: 68  Resp: 18  Height: 5\' 4"  (1.626 m)    Body mass index is 24.89 kg/m.   Orthostatic BP/Pulse Laying  140/75     72 Sitting   135/70    72 Standing 30 sec   140/70   76 Standing 3 minutes   140/70 76   General: The patient is well-developed and well-nourished and in no acute distress  Neck: The neck is supple.  The neck is nontender.  Neurologic Exam  Mental status: The patient is alert and oriented x 3 at the time of the examination. The patient has apparent normal recent and remote memory, with an apparently normal attention span and concentration ability.   Speech is normal.  Cranial nerves: Extraocular movements are full.  Facial strength and sensation are normal    Trapezius strength is normal. . No dysarthria is noted.  The tongue is midline, and the patient has symmetric elevation of the soft palate. Reduced left-sided hearing. The Weber does not lateralize.   Motor:  Fine tremor in the hands with intention. Muscle bulk is normal.   Tone is normal. Strength is  5 / 5 in all 4 extremities.   Sensory: Sensory testing is intact to touch and vibrati sensation to touch and vibration. on  Coordination: Cerebellar testing reveals good finger-nose-finger .  Gait and station: Station is normal.   The gait is minimally wide. Tandem gait is mildly wide. There is no retropulsion..   Romberg is negative...    Reflexes: Deep tendon reflexes are symmetric and normal bilaterally.       DIAGNOSTIC DATA (LABS, IMAGING, TESTING) - I reviewed patient records, labs, notes, testing and imaging myself where available.      ASSESSMENT AND PLAN  Multiple sclerosis (HCC)  Trigeminal neuralgia  Episodic lightheadedness  Hearing loss of left ear, unspecified hearing loss type   1.    She will continue Rebif for MS.   2.    Continue lamotrigine and Tegretol for Trigeminal  neuralgia.   Continue Baclofen prn.        3.    She is advised to wait 30-60 seconds or more before walking after she stands up, especially after prolonged sitting or laying.    Stay active and exercise as tolerated.    4.    rtc 6 months, sooner if problems   Richard A. Felecia Shelling, MD, PhD 05/18/2949, 8:84 PM Certified in Neurology, Clinical Neurophysiology, Sleep Medicine, Pain Medicine and Neuroimaging  [facility] [address] [place], [place] [address] [phone]

## 2017-05-30 ENCOUNTER — Telehealth: Payer: Self-pay | Admitting: Neurology

## 2017-05-30 NOTE — Telephone Encounter (Signed)
Per vo by [name], he is going to have her watch the symptoms rather than placing her on another medication right away.  Spoke to patient - she feels the shaking is tolerable and is in agreement with this plan.  He would like to see her in the office if the shaking becomes worse.  She will call back if she needs an earlier appt.

## 2017-05-30 NOTE — Telephone Encounter (Signed)
Pt states when she was here last she mentioned that her right hand is shaky at night especially in the evening. Pt states the hand just isn't right Pt is asking for a call to discuss

## 2017-07-29 ENCOUNTER — Ambulatory Visit: Payer: Self-pay | Admitting: Neurology

## 2017-08-06 ENCOUNTER — Other Ambulatory Visit: Payer: Self-pay | Admitting: Neurology

## 2017-09-03 ENCOUNTER — Other Ambulatory Visit: Payer: Self-pay | Admitting: Neurology

## 2017-11-19 ENCOUNTER — Ambulatory Visit: Payer: Medicare Other | Admitting: Neurology

## 2017-11-19 ENCOUNTER — Other Ambulatory Visit: Payer: Self-pay

## 2017-11-19 ENCOUNTER — Encounter: Payer: Self-pay | Admitting: Neurology

## 2017-11-19 VITALS — BP 130/80 | HR 70 | Resp 20 | Ht 64.0 in | Wt 152.0 lb

## 2017-11-19 DIAGNOSIS — G35 Multiple sclerosis: Secondary | ICD-10-CM

## 2017-11-19 DIAGNOSIS — R269 Unspecified abnormalities of gait and mobility: Secondary | ICD-10-CM

## 2017-11-19 DIAGNOSIS — G500 Trigeminal neuralgia: Secondary | ICD-10-CM | POA: Diagnosis not present

## 2017-11-19 DIAGNOSIS — R251 Tremor, unspecified: Secondary | ICD-10-CM | POA: Insufficient documentation

## 2017-11-19 DIAGNOSIS — R42 Dizziness and giddiness: Secondary | ICD-10-CM | POA: Diagnosis not present

## 2017-11-19 NOTE — Progress Notes (Signed)
[facility]  PATIENT: Crystal Huber DOB: March 30, 1945  REFERRING DOCTOR OR PCP:  [place]: Patient  _________________________________   HISTORICAL  CHIEF COMPLAINT:  Chief Complaint  Patient presents with  . Multiple Sclerosis    Sts. she continues to tolerate Rebif well.  Sts. trigeminal neuralgia has been ok./fim    HISTORY OF PRESENT ILLNESS:  Crystal Huber is a 72 y.o. woman with MS diagnosed in 2002.  Update 11/19/2017: She is doing well on Rebif and tolerates it well.   She denies any exacerbation.    Gait is ok.   Sometimes she feels mildly off balanced when she gets up and walks.   Meclizine has helped this.    She gets dizzy sometimes when she turns or moves rapidly.  She has had a little right hand tremor and is worried about PD (her husband had it).    She saw ENT and was felt to have Eustacian tube dysfunction at first..   She was advised to take meclizine and Flonase and take Imitrex if she had a worse episode.   Her 2018 brain MRI does not show mastoid effusion or sinusitis.    She denies any new numbness or weakness.  Bladder function is doing well.  Vision is fine.  She has had some depression but feels that it is minimal currently.   Her trigeminal neuralgia is doing much better.    The combination of Carbamezapine and lamotrigine is helping.   She takes baclofen if needed (not recently).      Update 05/19/2017:  For the most part, she feels stable and continues on Rebif.    In the mornings, she feels lightheaded and off balanced and sometimes needs to hold on.   She usually lays back down.   She makes sure that she waits a little before walking after she stands up.   She does not feels like she is going to pass out.   Some days, this does not occur and some days it may occur later in the day.   This has been occurring for several months but worse the past month.   She saw ENT and was told she has some left  hearing loss and that dizziness may be form Eustachian tube dysfunction.    A steroid shot did not help much.    Of note, last MRI mid 2018 did not show any mastoid effusions and no evidence of schwannoma.    She is on 3 blood pressure medications including hydralazine, metoprolol and an ARB   Her Trigeminal Neuralgia is doing well.  She is on CBZ bid and lamotrigine tid.    This regimen has helped her and she will take an extra CBZ if it flares up any.   No major flares lately.   She has mild bladder urgency.     Update 01/29/2017:   She feels her MS is stable on Rebif.    She tolerates it well and has no concerning side effects.   She feels gait, strength, sensation are all baseline.    Her balance is mildly off but this is mostly stable and she holds on with stairs.    Vision is fine.  Bladder function is ok most of the time but there is sometimes hesitancy.     She notes mile fatigue and mild cognitive issues.   She had HTN and med's were changed.   She was told to cut down om the Tegretol (  and she cut dose in half).   For her TN, she is taking lamotrigine 75 po tid (was 3 x 150 mg before July) and Tegretol 100 mg tid (was 200 mg po tid).   Her last levels on the old dose showed low lamotrigine (2.2) and therapeutic CBZ (6.6).      From 02/11/2017: MS:   She feels mostly stable.    She is on Rebif.   She uses the Rebiject II and prefers not to change to the new autoinjector.   She tolerates Rebif well and she has not had recent exacerbation.    She has some skin reactions  Trigeminal Neuralgia:  This is doing ok again .   Pain is in the distribution of the V2 neurotome    She is taking 3 x  200 mg tegretols and 3 x 150 mg Lamictal and baclofen 10 mg nightly (too sleepy if taken during the day).   When it was severe in 2016, the Sphenocath procedure helped.   She has less dizziness on lower dose of  Lamotrigine .   Meclizine also helps the dizziness  Gait/Strength/sensation:  Gait is mildly of  balanced. She needs to hold onto a side-rail with steps and sometimes holds the furniture or walls.    She has not fallen recently.  She denies any numbness or weakness in her legs.   Bladder:  She notes a little bit of bladder urgency at times and will have rare stress incontinence. She also has mild hesitancy but feels she emptie.  . She denies any recent urinary tract infections.  Vision:   She denies any visual problems.  Fatigue:  She notes  fatigue is stable but worsens as the day goes on.  This is better since reducing Tegretol from 4 to 3 pills    This is both physical and cognitive. Caffeine helps some.  She falls asleep easily on low dose Xanax.    Mood/cognition:      She denies any significant depression.    Her anxiety is mild most days.    She notes mild cognitive dysfunction and has noted some verbal fluency issues and also has mild short-term memory issues at times.    She drinks one glass of wine most nights.     MS History:   She had MRI and LP consistent with the diagnosis of MS in 2002 with trigeminal neuralgia.   She was started on Rebif.   She is on Rebif 22 mcg, tolerates it well and has had no definite exacerbation.  Last MRI was 2-3 years ago.    Her last MRI was 05/25/14 and showed no acute findings.    REVIEW OF SYSTEMS: Constitutional: No fevers, chills, sweats, or change in appetite.   Fatigue, worse as the day goes on. Eyes: No visual changes, double vision, eye pain Ear, nose and throat: No hearing loss, ear pain, nasal congestion, sore throat Cardiovascular: No chest pain, palpitations Respiratory: No shortness of breath at rest or with exertion.   No wheezes GastrointestinaI: No nausea, vomiting, diarrhea, abdominal pain, fecal incontinence Genitourinary: No dysuria, urinary retention or frequency.  No nocturia. Musculoskeletal: No neck pain, back pain Integumentary: No rash, pruritus, skin lesions Neurological: as above Psychiatric: Some depression and  anxiety. Endocrine: No palpitations, diaphoresis, change in appetite, change in weigh or increased thirst Hematologic/Lymphatic: No anemia, purpura, petechiae..   Bruises easily Allergic/Immunologic: No itchy/runny eyes, nasal congestion, recent allergic reactions, rashes  ALLERGIES: Allergies  Allergen Reactions  .  Metronidazole Nausea Only  . Penicillins Swelling    Yeast infection  . Statins     Gets hyponatremia and severe muscle pain  . Sulfa Antibiotics Swelling    yeast infection    HOME MEDICATIONS:  Current Outpatient Medications:  .  ALPRAZolam (XANAX) 0.5 MG tablet, Take 0.5 mg by mouth., Disp: , Rfl:  .  baclofen (LIORESAL) 10 MG tablet, TAKE ONE TABLET BY MOUTH THREE TIMES DAILY , Disp: 90 tablet, Rfl: 10 .  calcium citrate-vitamin D (CITRACAL+D) 315-200 MG-UNIT tablet, Take 1 tablet by mouth 2 (two) times daily., Disp: , Rfl:  .  carbamazepine (TEGRETOL) 200 MG tablet, take 1 tablet by mouth 4 times daily, Disp: 120 tablet, Rfl: 11 .  citalopram (CELEXA) 20 MG tablet, TAKE ONE TABLET BY MOUTH DAILY, Disp: , Rfl:  .  hydrALAZINE (APRESOLINE) 50 MG tablet, Take 50 mg by mouth., Disp: , Rfl:  .  HYDROcodone-acetaminophen (NORCO/VICODIN) 5-325 MG tablet, Take 1 tablet every 6 (six) hours as needed by mouth for moderate pain., Disp: 30 tablet, Rfl: 0 .  Interferon Beta-1a (REBIF) 22 MCG/0.5ML SOSY, Inject 22 mcg into the skin 3 (three) times a week., Disp: 36 Syringe, Rfl: 3 .  lactobacillus acidophilus (BACID) TABS tablet, Take 1 tablet by mouth daily., Disp: , Rfl:  .  lamoTRIgine (LAMICTAL) 150 MG tablet, TAKE ONE TABLET (150 MG TOTAL) BY MOUTH THREE TIMES DAILY, Disp: 90 tablet, Rfl: 4 .  latanoprost (XALATAN) 0.005 % ophthalmic solution, Place 1 drop into both eyes at bedtime., Disp: , Rfl:  .  losartan (COZAAR) 100 MG tablet, TAKE ONE TABLET BY MOUTH DAILY, Disp: , Rfl:  .  meclizine (ANTIVERT) 25 MG tablet, , Disp: , Rfl:  .  metoprolol succinate (TOPROL-XL) 50 MG 24  hr tablet, Take 50 mg by mouth., Disp: , Rfl:  .  olmesartan (BENICAR) 20 MG tablet, Take 20 mg by mouth., Disp: , Rfl:  .  Omega-3 Fatty Acids (FISH OIL PO), Take 1,400 mg by mouth daily., Disp: , Rfl:  .  vitamin B-12 (CYANOCOBALAMIN) 100 MCG tablet, Place under the tongue. Reported on 04/14/2015, Disp: , Rfl:  .  buPROPion (WELLBUTRIN XL) 150 MG 24 hr tablet, Take 150 mg by mouth., Disp: , Rfl:  .  omeprazole (PRILOSEC) 40 MG capsule, Take 40 mg by mouth., Disp: , Rfl:   Current Facility-Administered Medications:  .  0.9 %  sodium chloride infusion, 500 mL, Intravenous, Continuous, Nandigam, Venia Minks, MD  PAST MEDICAL HISTORY: Past Medical History:  Diagnosis Date  . Arthritis    right pinkie  . 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 Cataract    right eye  . GERD (gastroesophageal reflux disease)    prn tums, mild not often  . Glaucoma   . Hyperlipidemia   . Hypertension   . Multiple sclerosis ([place])   . Neuromuscular disorder (HCC)    trigeminal neuralgia  . Refusal of blood product     PAST SURGICAL HISTORY: Past Surgical History:  Procedure Laterality Date  . BREAST LUMPECTOMY Right    wears prosthesis  . COLONOSCOPY  12/20/2010  . ganglion cyst removal Bilateral   . PARTIAL HYSTERECTOMY    . POLYPECTOMY      FAMILY HISTORY: Family History  Problem Relation Age of Onset  . Congestive Heart Failure Mother   . Heart attack Father   . Stroke Sister   . Breast cancer Sister   . Heart disease Brother   .  Heart disease Sister   . Heart disease Brother   . Heart disease Brother   . Colon cancer Neg Hx   . Colon polyps Neg Hx     SOCIAL HISTORY:  Social History   Socioeconomic History  . Marital status: Married    Spouse name: Not on file  . Number of children: Not on file  . Years of education: Not on file  . Highest education level: Not on file  Occupational History  . Not on file  Social Needs  . Financial resource strain: Not on file  . Food  insecurity:    Worry: Not on file    Inability: Not on file  . Transportation needs:    Medical: Not on file    Non-medical: Not on file  Tobacco Use  . Smoking status: Former Research scientist (life sciences)  . Smokeless tobacco: Never Used  Substance and Sexual Activity  . Alcohol use: Yes    Alcohol/week: 0.0 standard drinks    Comment: 1-2 glasses of wine at night/fim  . Drug use: No  . Sexual activity: Not on file  Lifestyle  . Physical activity:    Days per week: Not on file    Minutes per session: Not on file  . Stress: Not on file  Relationships  . Social connections:    Talks on phone: Not on file    Gets together: Not on file    Attends religious service: Not on file    Active member of club or organization: Not on file    Attends meetings of clubs or organizations: Not on file    Relationship status: Not on file  . Intimate partner violence:    Fear of current or ex partner: Not on file    Emotionally abused: Not on file    Physically abused: Not on file    Forced sexual activity: Not on file  Other Topics Concern  . Not on file  Social History Narrative  . Not on file     PHYSICAL EXAM  Vitals:   11/19/17 1426  BP: 130/80  Pulse: 70  Resp: 20  Weight: 152 lb (68.9 kg)  Height: 5\' 4"  (1.626 m)    Body mass index is 26.09 kg/m.    General: The patient is well-developed and well-nourished and in no acute distress  Neck:  The neck is nontender and has good range of motion.  Neurologic Exam  Mental status: The patient is alert and oriented x 3 at the time of the examination. The patient has apparent normal recent and remote memory, with an apparently normal attention span and concentration ability.   Speech is normal.  Cranial nerves: Extraocular movements are full.  Facial strength and sensation was normal.  Trapezius strength was normal.  The tongue is midline, and the patient has symmetric elevation of the soft palate. Reduced left-sided hearing. The Weber does not  lateralize.   Motor: She has a fine tremor in the hands with intention. Muscle bulk is normal.   Tone is normal.  Specifically, there is no cogwheeling.  Strength is  5 / 5 in all 4 extremities.   Sensory: Sensory testing is intact to touch and vibrati sensation to touch and vibration. on  Coordination: Cerebellar testing shows good Finger nose finger and heel to shin.   Gait and station: Station is normal.   The gait is slightly wide.  Tandem gait is moderately wide.     There is no retropulsion..   Romberg  is negative...    Reflexes: Deep tendon reflexes are symmetric and normal bilaterally.       DIAGNOSTIC DATA (LABS, IMAGING, TESTING) - I reviewed patient records, labs, notes, testing and imaging myself where available.      ASSESSMENT AND PLAN  Multiple sclerosis (HCC)  Trigeminal neuralgia  Gait disorder  Episodic lightheadedness  Tremor   1.    Continue Rebif for MS.   2.    Lamotrigine and Tegretol for Trigeminal neuralgia.   Continue Baclofen prn.        3.    Stay active and exercise as tolerated. 4.    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  She does not have evidence of Parkinson's disease.   5.   Rtc 6 months, sooner if problems   Richard A. Felecia Shelling, MD, PhD 07/20/156, 6:82 PM Certified in Neurology, Clinical Neurophysiology, Sleep Medicine, Pain Medicine and Neuroimaging  [facility] [address], [place], [place] [address] [phone]

## 2017-12-15 ENCOUNTER — Telehealth: Payer: Self-pay | Admitting: *Deleted

## 2017-12-15 DIAGNOSIS — Z0289 Encounter for other administrative examinations: Secondary | ICD-10-CM

## 2017-12-15 NOTE — Telephone Encounter (Signed)
Disability forms completed and returned to medical records/fim 

## 2018-02-10 ENCOUNTER — Other Ambulatory Visit: Payer: Self-pay | Admitting: Neurology

## 2018-02-26 ENCOUNTER — Telehealth: Payer: Self-pay | Admitting: Neurology

## 2018-02-26 NOTE — Telephone Encounter (Signed)
LMOM for Lenaya that Rebif requires a PA.  We will get this done so that her ins. co. will continue to cover it.  She does not need to return this call unless she has questions/fim

## 2018-02-26 NOTE — Telephone Encounter (Signed)
Pt called stating that her insurance is not wanting to cover medication Interferon Beta-1a (REBIF) 22 MCG/0.5ML SOSYnow that it's classified as a 4. Requesting a call to discuss if [name] could prescribe any other medication or has any suggestions

## 2018-03-02 NOTE — Telephone Encounter (Signed)
I have spoken with Crystal Huber and explained that if her insurance no longer covers Rebif after the first of year, we will complete a PA to try and get it approved.  She verbalized understanding of same/fim

## 2018-03-02 NOTE — Telephone Encounter (Signed)
Pt has returned the call to RN Faith with questions, she is asking for a call back

## 2018-03-06 ENCOUNTER — Telehealth: Payer: Self-pay | Admitting: *Deleted

## 2018-03-06 NOTE — Telephone Encounter (Signed)
I have spoken with Crystal Huber. She received a letter from her ins. co. stating they will only approve a 30 day rx. for Rebif at a time.(They will allow Rebif #12/28 days)  Unable to fill 90 day rx.Marland KitchenHilton Cork

## 2018-03-09 ENCOUNTER — Other Ambulatory Visit: Payer: Self-pay | Admitting: Neurology

## 2018-03-24 NOTE — Telephone Encounter (Signed)
I have confirmed with [facility] (phone# [phone]) that pt's Rebif is an approved medication. Her plan now limits Rebif to a 30 day supply at a time. I have faxed [facility] to let them know pt. will now have to pick rx. up monthly.  Pt's [facility] ID# is [id number]

## 2018-05-12 ENCOUNTER — Telehealth: Payer: Self-pay | Admitting: Neurology

## 2018-05-12 NOTE — Telephone Encounter (Signed)
Spoke with [name]- he states she can take an extra dose of trileptal and lamictal tomorrow if needed. She should not take any more than this

## 2018-05-12 NOTE — Telephone Encounter (Signed)
Called, LVM for pt relaying Dr. Garth Bigness recommendation. Gave [facility] phone number if she has any further questions.

## 2018-05-12 NOTE — Telephone Encounter (Signed)
Patient calling for advise. She has trigeminal neuralgia on the left & has broken a tooth on the right. She has a dental appt tomorrow & is concerned that this will effect her neuralgia. Please call her at [phone]

## 2018-05-27 ENCOUNTER — Ambulatory Visit: Payer: Medicare Other | Admitting: Neurology

## 2018-05-27 ENCOUNTER — Encounter: Payer: Self-pay | Admitting: Neurology

## 2018-05-27 ENCOUNTER — Other Ambulatory Visit: Payer: Self-pay

## 2018-05-27 VITALS — BP 123/69 | HR 66 | Ht 64.0 in | Wt 151.0 lb

## 2018-05-27 DIAGNOSIS — R42 Dizziness and giddiness: Secondary | ICD-10-CM

## 2018-05-27 DIAGNOSIS — G35 Multiple sclerosis: Secondary | ICD-10-CM | POA: Diagnosis not present

## 2018-05-27 DIAGNOSIS — R350 Frequency of micturition: Secondary | ICD-10-CM

## 2018-05-27 DIAGNOSIS — R251 Tremor, unspecified: Secondary | ICD-10-CM | POA: Diagnosis not present

## 2018-05-27 DIAGNOSIS — R269 Unspecified abnormalities of gait and mobility: Secondary | ICD-10-CM

## 2018-05-27 DIAGNOSIS — G500 Trigeminal neuralgia: Secondary | ICD-10-CM | POA: Diagnosis not present

## 2018-05-27 DIAGNOSIS — G35D Multiple sclerosis, unspecified: Secondary | ICD-10-CM

## 2018-05-27 MED ORDER — CARBAMAZEPINE 200 MG PO TABS: ORAL_TABLET | ORAL | 4 refills | Status: DC

## 2018-05-27 MED ORDER — LAMOTRIGINE 150 MG PO TABS: 150.0000 mg | ORAL_TABLET | Freq: Three times a day (TID) | ORAL | 3 refills | Status: DC

## 2018-05-27 NOTE — Progress Notes (Signed)
[facility]  PATIENT: Crystal Huber DOB: December 27, 1945  REFERRING DOCTOR OR PCP:  [place]: Patient  _________________________________   HISTORICAL  CHIEF COMPLAINT:  Chief Complaint  Patient presents with  . Follow-up    RM 12, alone. Last seen 11/19/17. Recently had sodium level 2-3 weeks ago and it was 128 per pt.  . Multiple Sclerosis    On rebif, tolerating well.  . Trigeminal Neuralgia    Taking lamictal, trileptal. Wanting new prescription giving 90 days supply at a time    HISTORY OF PRESENT ILLNESS:  Crystal Huber is a 73 y.o. woman with MS diagnosed in 2002.  Update 05/27/2018: She feels she is doing well on Rebif and has no new exacerbation.    She tolerates it well.   No recent exacerbation.  Recent Vit D was low normal at 33.    Gait is mildly off balanced and she notes mild dizziness.    Strength is good and she denies any sensory issues in her limbs.   She has trigeminal neuralgia and does well with carbamazepine and lamotrigine.   Her sodium was very low at 123 but recheck is much better at 128.     She has urinary urgency but no incontinence.    Vision is doing well.     She has glaucoma and sees opthalmology regularly.   She notes mild depression at times.  She takes Wellbutrin.     Update 11/19/2017: She is doing well on Rebif and tolerates it well.   She denies any exacerbation.    Gait is ok.   Sometimes she feels mildly off balanced when she gets up and walks.   Meclizine has helped this.    She gets dizzy sometimes when she turns or moves rapidly.  She has had a little right hand tremor and is worried about PD (her husband had it).    She saw ENT and was felt to have Eustacian tube dysfunction at first..   She was advised to take meclizine and Flonase and take Imitrex if she had a worse episode.   Her 2018 brain MRI does not show mastoid effusion or sinusitis.    She denies any new numbness or  weakness.  Bladder function is doing well.  Vision is fine.  She has had some depression but feels that it is minimal currently.   Her trigeminal neuralgia is doing much better.    The combination of Carbamezapine and lamotrigine is helping.   She takes baclofen if needed (not recently).      Update 05/19/2017:  For the most part, she feels stable and continues on Rebif.    In the mornings, she feels lightheaded and off balanced and sometimes needs to hold on.   She usually lays back down.   She makes sure that she waits a little before walking after she stands up.   She does not feels like she is going to pass out.   Some days, this does not occur and some days it may occur later in the day.   This has been occurring for several months but worse the past month.   She saw ENT and was told she has some left hearing loss and that dizziness may be form Eustachian tube dysfunction.    A steroid shot did not help much.    Of note, last MRI mid 2018 did not show any mastoid effusions and no evidence of schwannoma.  She is on 3 blood pressure medications including hydralazine, metoprolol and an ARB   Her Trigeminal Neuralgia is doing well.  She is on CBZ bid and lamotrigine tid.    This regimen has helped her and she will take an extra CBZ if it flares up any.   No major flares lately.   She has mild bladder urgency.     Update 01/29/2017:   She feels her MS is stable on Rebif.    She tolerates it well and has no concerning side effects.   She feels gait, strength, sensation are all baseline.    Her balance is mildly off but this is mostly stable and she holds on with stairs.    Vision is fine.  Bladder function is ok most of the time but there is sometimes hesitancy.     She notes mile fatigue and mild cognitive issues.   She had HTN and med's were changed.   She was told to cut down om the Tegretol (and she cut dose in half).   For her TN, she is taking lamotrigine 75 po tid (was 3 x 150 mg before July) and  Tegretol 100 mg tid (was 200 mg po tid).   Her last levels on the old dose showed low lamotrigine (2.2) and therapeutic CBZ (6.6).      From 02/11/2017: MS:   She feels mostly stable.    She is on Rebif.   She uses the Rebiject II and prefers not to change to the new autoinjector.   She tolerates Rebif well and she has not had recent exacerbation.    She has some skin reactions  Trigeminal Neuralgia:  This is doing ok again .   Pain is in the distribution of the V2 neurotome    She is taking 3 x  200 mg tegretols and 3 x 150 mg Lamictal and baclofen 10 mg nightly (too sleepy if taken during the day).   When it was severe in 2016, the Sphenocath procedure helped.   She has less dizziness on lower dose of  Lamotrigine .   Meclizine also helps the dizziness  Gait/Strength/sensation:  Gait is mildly of balanced. She needs to hold onto a side-rail with steps and sometimes holds the furniture or walls.    She has not fallen recently.  She denies any numbness or weakness in her legs.   Bladder:  She notes a little bit of bladder urgency at times and will have rare stress incontinence. She also has mild hesitancy but feels she emptie.  . She denies any recent urinary tract infections.  Vision:   She denies any visual problems.  Fatigue:  She notes  fatigue is stable but worsens as the day goes on.  This is better since reducing Tegretol from 4 to 3 pills    This is both physical and cognitive. Caffeine helps some.  She falls asleep easily on low dose Xanax.    Mood/cognition:      She denies any significant depression.    Her anxiety is mild most days.    She notes mild cognitive dysfunction and has noted some verbal fluency issues and also has mild short-term memory issues at times.    She drinks one glass of wine most nights.     MS History:   She had MRI and LP consistent with the diagnosis of MS in 2002 with trigeminal neuralgia.   She was started on Rebif.   She is on Rebif  22 mcg, tolerates it well  and has had no definite exacerbation.  Last MRI was 2-3 years ago.    Her last MRI was 05/25/14 and showed no acute findings.    REVIEW OF SYSTEMS: Constitutional: No fevers, chills, sweats, or change in appetite.   Fatigue, worse as the day goes on. Eyes: No visual changes, double vision, eye pain Ear, nose and throat: No hearing loss, ear pain, nasal congestion, sore throat Cardiovascular: No chest pain, palpitations Respiratory: No shortness of breath at rest or with exertion.   No wheezes GastrointestinaI: No nausea, vomiting, diarrhea, abdominal pain, fecal incontinence Genitourinary: No dysuria, urinary retention or frequency.  No nocturia. Musculoskeletal: No neck pain, back pain Integumentary: No rash, pruritus, skin lesions Neurological: as above Psychiatric: Some depression and anxiety. Endocrine: No palpitations, diaphoresis, change in appetite, change in weigh or increased thirst Hematologic/Lymphatic: No anemia, purpura, petechiae..   Bruises easily Allergic/Immunologic: No itchy/runny eyes, nasal congestion, recent allergic reactions, rashes  ALLERGIES: Allergies  Allergen Reactions  . Metronidazole Nausea Only  . Penicillins Swelling    Yeast infection  . Statins     Gets hyponatremia and severe muscle pain  . Sulfa Antibiotics Swelling    yeast infection    HOME MEDICATIONS:  Current Outpatient Medications:  .  ALPRAZolam (XANAX) 0.5 MG tablet, Take 0.25 mg by mouth at bedtime. , Disp: , Rfl:  .  baclofen (LIORESAL) 10 MG tablet, TAKE ONE TABLET BY MOUTH THREE TIMES DAILY , Disp: 90 tablet, Rfl: 10 .  calcium citrate-vitamin D (CITRACAL+D) 315-200 MG-UNIT tablet, Take 1 tablet by mouth 2 (two) times daily., Disp: , Rfl:  .  carbamazepine (TEGRETOL) 200 MG tablet, Take up to three pills a day as directed, Disp: 270 tablet, Rfl: 4 .  citalopram (CELEXA) 20 MG tablet, TAKE ONE TABLET BY MOUTH DAILY, Disp: , Rfl:  .  hydrALAZINE (APRESOLINE) 50 MG tablet, Take 50  mg by mouth., Disp: , Rfl:  .  HYDROcodone-acetaminophen (NORCO/VICODIN) 5-325 MG tablet, Take 1 tablet every 6 (six) hours as needed by mouth for moderate pain., Disp: 30 tablet, Rfl: 0 .  lactobacillus acidophilus (BACID) TABS tablet, Take 1 tablet by mouth daily., Disp: , Rfl:  .  lamoTRIgine (LAMICTAL) 150 MG tablet, Take 1 tablet (150 mg total) by mouth 3 (three) times daily., Disp: 270 tablet, Rfl: 3 .  latanoprost (XALATAN) 0.005 % ophthalmic solution, Place 1 drop into both eyes at bedtime., Disp: , Rfl:  .  losartan (COZAAR) 100 MG tablet, TAKE ONE TABLET BY MOUTH DAILY, Disp: , Rfl:  .  meclizine (ANTIVERT) 25 MG tablet, , Disp: , Rfl:  .  metoprolol succinate (TOPROL-XL) 50 MG 24 hr tablet, Take 50 mg by mouth., Disp: , Rfl:  .  olmesartan (BENICAR) 20 MG tablet, Take 20 mg by mouth., Disp: , Rfl:  .  Omega-3 Fatty Acids (FISH OIL PO), Take 1,400 mg by mouth daily., Disp: , Rfl:  .  REBIF 22 MCG/0.5ML SOSY, INJECT 22MCG INTO THE SKIN THREE TIMES A WEEK, Disp: 12 Syringe, Rfl: 11 .  vitamin B-12 (CYANOCOBALAMIN) 100 MCG tablet, Place under the tongue. Reported on 04/14/2015, Disp: , Rfl:  .  buPROPion (WELLBUTRIN XL) 150 MG 24 hr tablet, Take 150 mg by mouth., Disp: , Rfl:  .  omeprazole (PRILOSEC) 40 MG capsule, Take 40 mg by mouth., Disp: , Rfl:   Current Facility-Administered Medications:  .  0.9 %  sodium chloride infusion, 500 mL, Intravenous, Continuous, Nandigam, Kavitha V,  MD  PAST MEDICAL HISTORY: Past Medical History:  Diagnosis Date  . Arthritis    right pinkie  . 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 Cataract    right eye  . GERD (gastroesophageal reflux disease)    prn tums, mild not often  . Glaucoma   . Hyperlipidemia   . Hypertension   . Multiple sclerosis ([place])   . Neuromuscular disorder ([facility])    trigeminal neuralgia  . Refusal of blood product     PAST SURGICAL HISTORY: Past Surgical History:  Procedure Laterality Date  . BREAST LUMPECTOMY Right     wears prosthesis  . COLONOSCOPY  12/20/2010  . ganglion cyst removal Bilateral   . PARTIAL HYSTERECTOMY    . POLYPECTOMY      FAMILY HISTORY: Family History  Problem Relation Age of Onset  . Congestive Heart Failure Mother   . Heart attack Father   . Stroke Sister   . Breast cancer Sister   . Heart disease Brother   . Heart disease Sister   . Heart disease Brother   . Heart disease Brother   . Colon cancer Neg Hx   . Colon polyps Neg Hx     SOCIAL HISTORY:  Social History   Socioeconomic History  . Marital status: Married    Spouse name: Not on file  . Number of children: Not on file  . Years of education: Not on file  . Highest education level: Not on file  Occupational History  . Not on file  Social Needs  . Financial resource strain: Not on file  . Food insecurity:    Worry: Not on file    Inability: Not on file  . Transportation needs:    Medical: Not on file    Non-medical: Not on file  Tobacco Use  . Smoking status: Former Research scientist (life sciences)  . Smokeless tobacco: Never Used  Substance and Sexual Activity  . Alcohol use: Yes    Alcohol/week: 0.0 standard drinks    Comment: 1-2 glasses of wine at night/fim  . Drug use: No  . Sexual activity: Not on file  Lifestyle  . Physical activity:    Days per week: Not on file    Minutes per session: Not on file  . Stress: Not on file  Relationships  . Social connections:    Talks on phone: Not on file    Gets together: Not on file    Attends religious service: Not on file    Active member of club or organization: Not on file    Attends meetings of clubs or organizations: Not on file    Relationship status: Not on file  . Intimate partner violence:    Fear of current or ex partner: Not on file    Emotionally abused: Not on file    Physically abused: Not on file    Forced sexual activity: Not on file  Other Topics Concern  . Not on file  Social History Narrative  . Not on file     PHYSICAL EXAM  Vitals:    05/27/18 1315  BP: 123/69  Pulse: 66  Weight: 151 lb (68.5 kg)  Height: 5\' 4"  (1.626 m)    Body mass index is 25.92 kg/m.    General: The patient is well-developed and well-nourished and in no acute distress  Neck:  The neck is nontender and has good range of motion.  Neurologic Exam  Mental status: The patient is alert  and oriented x 3 at the time of the examination. The patient has apparent normal recent and remote memory, with an apparently normal attention span and concentration ability.   Speech is normal.  Cranial nerves: Extraocular movements are full.  Color vision and acuity are symmetric.  Facial strength and sensation was normal.  Trapezius strength was normal.  The tongue is midline, and the patient has symmetric elevation of the soft palate. Reduced left-sided hearing. The Weber does not lateralize.   Motor: Tremor not notable. Muscle bulk is normal.   Tone is normal.  Specifically, there is no cogwheeling.  Strength is  5 / 5 in all 4 extremities.   Sensory: Sensory testing is intact to touch and vibrati sensation to touch and vibration. on  Coordination: Cerebellar testing shows good finger nose finger and heel to shin.   Gait and station: Station is normal.   The gait is slightly wide.  Tandem gait is moderately wide.    There is no retropulsion..   Romberg is negative...    Reflexes: Deep tendon reflexes are symmetric and normal bilaterally.       DIAGNOSTIC DATA (LABS, IMAGING, TESTING) - I reviewed patient records, labs, notes, testing and imaging myself where available.      ASSESSMENT AND PLAN  Multiple sclerosis (HCC)  Trigeminal neuralgia  Episodic lightheadedness  Tremor  Urinary frequency  Gait disorder   1.    Continue Rebif for MS.  Recent labwork was fine.   2.    She will continue Lamotrigine 150 mg po tid and Tegretol 200 mg bid to tid for Trigeminal neuralgia.   If worsens can take prn Baclofen.     3.    Stay active and exercise  as tolerated. 4.    Rtc 6 months, sooner if problems    A. Felecia Shelling, MD, PhD 4/00/8676, 1:95 PM Certified in Neurology, Clinical Neurophysiology, Sleep Medicine, Pain Medicine and Neuroimaging  [facility] [address] [place],  [address] [phone]

## 2018-06-25 ENCOUNTER — Telehealth: Payer: Self-pay | Admitting: *Deleted

## 2018-06-25 MED ORDER — INTERFERON BETA-1A 22 MCG/0.5ML ~~LOC~~ SOSY: 22.0000 ug | PREFILLED_SYRINGE | SUBCUTANEOUS | 11 refills | Status: DC

## 2018-06-25 NOTE — Telephone Encounter (Signed)
Patient called back. She previously was using [facility] to fill rx rebif but d/t covid-19 she is trying to stay away from there as much as she can.   Advised since rx specialty medication, I will [facility] for [facility]. She has about 3 weeks worth of medication now.

## 2018-06-25 NOTE — Telephone Encounter (Signed)
Called, LVM for pt to call and clarify what pharmacy she is filling rx Rebif at. We sent rx to [facility] last but received request today [facility]. Gave [facility] phone number for call back

## 2018-06-25 NOTE — Addendum Note (Signed)
Addended by: Hope Pigeon on: 06/25/2018 03:48 PM   Modules accepted: Orders

## 2018-08-03 ENCOUNTER — Telehealth: Payer: Self-pay | Admitting: Neurology

## 2018-08-03 NOTE — Telephone Encounter (Signed)
Pt states that re: her Trigeminal Neuralgia she has break thru pain and wants to know what she can take or add to help ease the pain.  Pt would like to know what can she take during a headache.  Pt would also like to know if it is okay for an occasional glass of wine. Please call

## 2018-08-03 NOTE — Telephone Encounter (Signed)
For breakthrough, she can take an extra lamotigine a day.  Ok to take a glass of wine

## 2018-08-03 NOTE — Telephone Encounter (Signed)
Called and LVM for pt relaying Dr. Garth Bigness recommendations. Gave [facility] phone number if she has further questions.

## 2018-08-17 ENCOUNTER — Telehealth: Payer: Self-pay | Admitting: Neurology

## 2018-08-17 ENCOUNTER — Other Ambulatory Visit: Payer: Self-pay | Admitting: Neurology

## 2018-08-17 NOTE — Telephone Encounter (Signed)
Called pt. She is only taking baclofen, 1 tablet qhs po. She will trying increased 1-2 more pills per day to see if this helps sx. She will call back if any further questions/concerns.

## 2018-08-17 NOTE — Telephone Encounter (Signed)
Sometimes higher dose of baclofen can help, she can take 1-2 more pills of the baclofen daily

## 2018-08-17 NOTE — Telephone Encounter (Signed)
Pt has a question about her Trigeminal Neuralgia . Pt's mouth is hurting and she has taken her pain medication but would like to know is she can do something else before it get's worse. Please advise.

## 2018-08-17 NOTE — Telephone Encounter (Signed)
Dr. Sater- what would you recommend? 

## 2018-08-19 NOTE — Telephone Encounter (Signed)
Pt called back stating that the 2 pills a day of her Baclofen is making her dizzy and she would like to know if she can cut back to one and a half a day instead of the 2 whole ones. Please advise. Pt states you can leave a detailed message on her machine if she does not pick up.

## 2018-08-19 NOTE — Telephone Encounter (Signed)
Dr. Sater- are you ok with this? °

## 2018-08-19 NOTE — Telephone Encounter (Signed)
Called pt. Relayed ok per [name] for her to take 1.5 tabs instead of 2 tabs. She verbalized understanding. She will call back if any further questions or concerns.

## 2018-08-19 NOTE — Telephone Encounter (Signed)
Ok to do that dose

## 2018-12-02 ENCOUNTER — Ambulatory Visit (INDEPENDENT_AMBULATORY_CARE_PROVIDER_SITE_OTHER): Payer: Medicare Other | Admitting: Neurology

## 2018-12-02 ENCOUNTER — Encounter: Payer: Self-pay | Admitting: Neurology

## 2018-12-02 ENCOUNTER — Other Ambulatory Visit: Payer: Self-pay

## 2018-12-02 DIAGNOSIS — G500 Trigeminal neuralgia: Secondary | ICD-10-CM | POA: Diagnosis not present

## 2018-12-02 DIAGNOSIS — G35 Multiple sclerosis: Secondary | ICD-10-CM | POA: Diagnosis not present

## 2018-12-02 NOTE — Progress Notes (Signed)
[facility]  PATIENT: Crystal ESQUIVIAS DOB: November 18, 1945  REFERRING DOCTOR OR PCP:  [place]: Patient  _________________________________   HISTORICAL  CHIEF COMPLAINT:  Chief Complaint  Patient presents with  . Multiple Sclerosis    FU, niece- Tammy "I stump my toes on right foot; sometimes difficulty writing at night' dizizness- seeing eye dr Monday"    HISTORY OF PRESENT ILLNESS:  Crystal Huber is a 73 y.o. woman with MS diagnosed in 2002.  Update 12/02/2018: She is on Rebif and tolerates it well.    She was diagnosed with MS in 2002 and her last possible exacerbation was 6 years ago when the trigeminal neuralgia flared (but she has had left TN since shortly after diagnosis)   Tegretol bid and Lamotrigine tid help the TN best.   She increases tegretol to tid if a flare occurs.  Her gait is doing worse since the fracture.   She broke two toes hitting a piece of furniture.  She feels balance is off and she stumbles a lot.   Bladder function is fine.     She denies depression but has some anxiety still.     We discussed possibly stopping Rebif as many people can do without DMT later in life with ,ore chronic mild MS.    Update 05/27/2018: She feels she is doing well on Rebif and has no new exacerbation.    She tolerates it well.   No recent exacerbation.  Recent Vit D was low normal at 33.    Gait is mildly off balanced and she notes mild dizziness.    Strength is good and she denies any sensory issues in her limbs.   She has trigeminal neuralgia and does well with carbamazepine and lamotrigine.   Her sodium was very low at 123 but recheck is much better at 128.     She has urinary urgency but no incontinence.    Vision is doing well.     She has glaucoma and sees opthalmology regularly.   She notes mild depression at times.  She takes Wellbutrin.     Update 11/19/2017: She is doing well on Rebif and tolerates it well.   She  denies any exacerbation.    Gait is ok.   Sometimes she feels mildly off balanced when she gets up and walks.   Meclizine has helped this.    She gets dizzy sometimes when she turns or moves rapidly.  She has had a little right hand tremor and is worried about PD (her husband had it).    She saw ENT and was felt to have Eustacian tube dysfunction at first..   She was advised to take meclizine and Flonase and take Imitrex if she had a worse episode.   Her 2018 brain MRI does not show mastoid effusion or sinusitis.    She denies any new numbness or weakness.  Bladder function is doing well.  Vision is fine.  She has had some depression but feels that it is minimal currently.   Her trigeminal neuralgia is doing much better.    The combination of Carbamezapine and lamotrigine is helping.   She takes baclofen if needed (not recently).      Update 05/19/2017:  For the most part, she feels stable and continues on Rebif.    In the mornings, she feels lightheaded and off balanced and sometimes needs to hold on.   She usually lays back down.   She makes  sure that she waits a little before walking after she stands up.   She does not feels like she is going to pass out.   Some days, this does not occur and some days it may occur later in the day.   This has been occurring for several months but worse the past month.   She saw ENT and was told she has some left hearing loss and that dizziness may be form Eustachian tube dysfunction.    A steroid shot did not help much.    Of note, last MRI mid 2018 did not show any mastoid effusions and no evidence of schwannoma.    She is on 3 blood pressure medications including hydralazine, metoprolol and an ARB   Her Trigeminal Neuralgia is doing well.  She is on CBZ bid and lamotrigine tid.    This regimen has helped her and she will take an extra CBZ if it flares up any.   No major flares lately.   She has mild bladder urgency.     Update 01/29/2017:   She feels her MS is stable  on Rebif.    She tolerates it well and has no concerning side effects.   She feels gait, strength, sensation are all baseline.    Her balance is mildly off but this is mostly stable and she holds on with stairs.    Vision is fine.  Bladder function is ok most of the time but there is sometimes hesitancy.     She notes mile fatigue and mild cognitive issues.   She had HTN and med's were changed.   She was told to cut down om the Tegretol (and she cut dose in half).   For her TN, she is taking lamotrigine 75 po tid (was 3 x 150 mg before July) and Tegretol 100 mg tid (was 200 mg po tid).   Her last levels on the old dose showed low lamotrigine (2.2) and therapeutic CBZ (6.6).      From 02/11/2017: MS:   She feels mostly stable.    She is on Rebif.   She uses the Rebiject II and prefers not to change to the new autoinjector.   She tolerates Rebif well and she has not had recent exacerbation.    She has some skin reactions  Trigeminal Neuralgia:  This is doing ok again .   Pain is in the distribution of the V2 neurotome    She is taking 3 x  200 mg tegretols and 3 x 150 mg Lamictal and baclofen 10 mg nightly (too sleepy if taken during the day).   When it was severe in 2016, the Sphenocath procedure helped.   She has less dizziness on lower dose of  Lamotrigine .   Meclizine also helps the dizziness  Gait/Strength/sensation:  Gait is mildly of balanced. She needs to hold onto a side-rail with steps and sometimes holds the furniture or walls.    She has not fallen recently.  She denies any numbness or weakness in her legs.   Bladder:  She notes a little bit of bladder urgency at times and will have rare stress incontinence. She also has mild hesitancy but feels she emptie.  . She denies any recent urinary tract infections.  Vision:   She denies any visual problems.  Fatigue:  She notes  fatigue is stable but worsens as the day goes on.  This is better since reducing Tegretol from 4 to 3 pills  This is  both physical and cognitive. Caffeine helps some.  She falls asleep easily on low dose Xanax.    Mood/cognition:      She denies any significant depression.    Her anxiety is mild most days.    She notes mild cognitive dysfunction and has noted some verbal fluency issues and also has mild short-term memory issues at times.    She drinks one glass of wine most nights.     MS History:   She had MRI and LP consistent with the diagnosis of MS in 2002 with trigeminal neuralgia.   She was started on Rebif.   She is on Rebif 22 mcg, tolerates it well and has had no definite exacerbation.  Last MRI was 2-3 years ago.    Her last MRI was 05/25/14 and showed no acute findings.    REVIEW OF SYSTEMS: Constitutional: No fevers, chills, sweats, or change in appetite.   Fatigue, worse as the day goes on. Eyes: No visual changes, double vision, eye pain Ear, nose and throat: No hearing loss, ear pain, nasal congestion, sore throat Cardiovascular: No chest pain, palpitations Respiratory: No shortness of breath at rest or with exertion.   No wheezes GastrointestinaI: No nausea, vomiting, diarrhea, abdominal pain, fecal incontinence Genitourinary: No dysuria, urinary retention or frequency.  No nocturia. Musculoskeletal: No neck pain, back pain Integumentary: No rash, pruritus, skin lesions Neurological: as above Psychiatric: Some depression and anxiety. Endocrine: No palpitations, diaphoresis, change in appetite, change in weigh or increased thirst Hematologic/Lymphatic: No anemia, purpura, petechiae..   Bruises easily Allergic/Immunologic: No itchy/runny eyes, nasal congestion, recent allergic reactions, rashes  ALLERGIES: Allergies  Allergen Reactions  . Metronidazole Nausea Only  . Penicillins Swelling    Yeast infection  . Statins     Gets hyponatremia and severe muscle pain  . Sulfa Antibiotics Swelling    yeast infection    HOME MEDICATIONS:  Current Outpatient Medications:  .  ALPRAZolam  (XANAX) 0.5 MG tablet, Take 0.25 mg by mouth at bedtime. , Disp: , Rfl:  .  baclofen (LIORESAL) 10 MG tablet, TAKE ONE TABLET BY MOUTH THREE TIMES DAILY , Disp: 90 tablet, Rfl: 10 .  calcium citrate-vitamin D (CITRACAL+D) 315-200 MG-UNIT tablet, Take 1 tablet by mouth 2 (two) times daily., Disp: , Rfl:  .  carbamazepine (TEGRETOL) 200 MG tablet, Take up to three pills a day as directed, Disp: 270 tablet, Rfl: 4 .  citalopram (CELEXA) 20 MG tablet, TAKE ONE TABLET BY MOUTH DAILY, Disp: , Rfl:  .  hydrALAZINE (APRESOLINE) 50 MG tablet, Take 50 mg by mouth., Disp: , Rfl:  .  HYDROcodone-acetaminophen (NORCO/VICODIN) 5-325 MG tablet, Take 1 tablet every 6 (six) hours as needed by mouth for moderate pain., Disp: 30 tablet, Rfl: 0 .  lactobacillus acidophilus (BACID) TABS tablet, Take 1 tablet by mouth daily., Disp: , Rfl:  .  lamoTRIgine (LAMICTAL) 150 MG tablet, Take 1 tablet (150 mg total) by mouth 3 (three) times daily., Disp: 270 tablet, Rfl: 3 .  latanoprost (XALATAN) 0.005 % ophthalmic solution, Place 1 drop into both eyes at bedtime., Disp: , Rfl:  .  losartan (COZAAR) 100 MG tablet, TAKE ONE TABLET BY MOUTH DAILY, Disp: , Rfl:  .  meclizine (ANTIVERT) 25 MG tablet, , Disp: , Rfl:  .  metoprolol succinate (TOPROL-XL) 50 MG 24 hr tablet, Take 50 mg by mouth., Disp: , Rfl:  .  olmesartan (BENICAR) 20 MG tablet, Take 20 mg by mouth., Disp: , Rfl:  .  Omega-3 Fatty Acids (FISH OIL PO), Take 1,400 mg by mouth daily., Disp: , Rfl:  .  REBIF 22 MCG/0.5ML SOSY, INJECT 22MCG INTO THE SKIN 3 TIMES A WEEK, Disp: 12 Syringe, Rfl: 11 .  vitamin B-12 (CYANOCOBALAMIN) 100 MCG tablet, Place under the tongue. Reported on 04/14/2015, Disp: , Rfl:  .  buPROPion (WELLBUTRIN XL) 150 MG 24 hr tablet, Take 150 mg by mouth., Disp: , Rfl:  .  omeprazole (PRILOSEC) 40 MG capsule, Take 40 mg by mouth., Disp: , Rfl:   Current Facility-Administered Medications:  .  0.9 %  sodium chloride infusion, 500 mL, Intravenous,  Continuous, Nandigam, Venia Minks, MD  PAST MEDICAL HISTORY: Past Medical History:  Diagnosis Date  . Arthritis    right pinkie  . 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 Cataract    right eye  . GERD (gastroesophageal reflux disease)    prn tums, mild not often  . Glaucoma   . Hyperlipidemia   . Hypertension   . Multiple sclerosis ([place])   . Neuromuscular disorder ([facility])    trigeminal neuralgia  . Refusal of blood product     PAST SURGICAL HISTORY: Past Surgical History:  Procedure Laterality Date  . BREAST LUMPECTOMY Right    wears prosthesis  . COLONOSCOPY  12/20/2010  . ganglion cyst removal Bilateral   . PARTIAL HYSTERECTOMY    . POLYPECTOMY      FAMILY HISTORY: Family History  Problem Relation Age of Onset  . Congestive Heart Failure Mother   . Heart attack Father   . Stroke Sister   . Breast cancer Sister   . Heart disease Brother   . Heart disease Sister   . Heart disease Brother   . Heart disease Brother   . Colon cancer Neg Hx   . Colon polyps Neg Hx     SOCIAL HISTORY:  Social History   Socioeconomic History  . Marital status: Married    Spouse name: Not on file  . Number of children: Not on file  . Years of education: Not on file  . Highest education level: Not on file  Occupational History  . Not on file  Social Needs  . Financial resource strain: Not on file  . Food insecurity    Worry: Not on file    Inability: Not on file  . Transportation needs    Medical: Not on file    Non-medical: Not on file  Tobacco Use  . Smoking status: Former Research scientist (life sciences)  . Smokeless tobacco: Never Used  Substance and Sexual Activity  . Alcohol use: Yes    Alcohol/week: 0.0 standard drinks    Comment: 1-2 glasses of wine at night/fim  . Drug use: No  . Sexual activity: Not on file  Lifestyle  . Physical activity    Days per week: Not on file    Minutes per session: Not on file  . Stress: Not on file  Relationships  . Social Herbalist on phone: Not  on file    Gets together: Not on file    Attends religious service: Not on file    Active member of club or organization: Not on file    Attends meetings of clubs or organizations: Not on file    Relationship status: Not on file  . Intimate partner violence    Fear of current or ex partner: Not on file    Emotionally abused: Not on file  Physically abused: Not on file    Forced sexual activity: Not on file  Other Topics Concern  . Not on file  Social History Narrative  . Not on file     PHYSICAL EXAM  Vitals:   12/02/18 1257  BP: (!) 154/79  Pulse: 65  Temp: 97.7 F (36.5 C)  Weight: 150 lb 3.2 oz (68.1 kg)  Height: 5\' 3"  (1.6 m)    Body mass index is 26.61 kg/m.    General: The patient is well-developed and well-nourished and in no acute distress  Neck:  The neck is nontender and has good range of motion.  Neurologic Exam  Mental status: The patient is alert and oriented x 3 at the time of the examination. The patient has apparent normal recent and remote memory, with an apparently normal attention span and concentration ability.   Speech is normal.  Cranial nerves: Extraocular movements are full.  Color vision and acuity are symmetric.  Facial strength and sensation was normal.  Trapezius strength was normal.  The tongue is midline, and the patient has symmetric elevation of the soft palate. Reduced left-sided hearing. The Weber does not lateralize.   Motor: Tremor not notable. Muscle bulk is normal.   Tone is normal.  Specifically, there is no cogwheeling.  Strength is  5 / 5 in all 4 extremities.   Sensory: Sensory testing is intact to touch and vibrati sensation to touch and vibration. on  Coordination: Cerebellar testing shows good finger nose finger and heel to shin.   Gait and station: Station is normal.   The gait is slightly wide.  Tandem gait is moderately wide.    There is no retropulsion..   Romberg is negative...    Reflexes: Deep tendon reflexes are  symmetric and normal bilaterally.       DIAGNOSTIC DATA (LABS, IMAGING, TESTING) - I reviewed patient records, labs, notes, testing and imaging myself where available.      ASSESSMENT AND PLAN  No diagnosis found.   1.    Continue Rebif for MS.  Recent labwork was fine.   2.    She will continue Lamotrigine 150 mg po tid and Tegretol 200 mg bid to tid for Trigeminal neuralgia.   If worsens can take prn Baclofen.     3.    Stay active and exercise as tolerated. 4.    Rtc 6 months, sooner if problems   Kinley Dozier A. Felecia Shelling, MD, PhD [id number], [id number] PM Certified in Neurology, Clinical Neurophysiology, Sleep Medicine, Pain Medicine and Neuroimaging  [facility] [address], [place], [place] [address] [phone]

## 2018-12-03 ENCOUNTER — Telehealth: Payer: Self-pay | Admitting: Neurology

## 2018-12-03 NOTE — Telephone Encounter (Signed)
[employer] medicare order sent to [facility]. No auth they will reach out to the patient to schedule.  

## 2019-02-06 ENCOUNTER — Other Ambulatory Visit: Payer: Self-pay | Admitting: Neurology

## 2019-03-13 ENCOUNTER — Other Ambulatory Visit: Payer: Self-pay | Admitting: Neurology

## 2019-03-17 ENCOUNTER — Telehealth: Payer: Self-pay | Admitting: *Deleted

## 2019-03-17 NOTE — Telephone Encounter (Signed)
[employer] Disability form completed.  Sent for review and signature. (pod 2).

## 2019-03-17 NOTE — Telephone Encounter (Signed)
Signed form fax confirmation received [phone]. [place].

## 2019-03-23 NOTE — Telephone Encounter (Signed)
Pt called and wanted to know if a copy of the form can be mailed to her as well. Please advise.

## 2019-04-26 ENCOUNTER — Other Ambulatory Visit: Payer: Self-pay | Admitting: Neurology

## 2019-06-02 ENCOUNTER — Other Ambulatory Visit: Payer: Self-pay

## 2019-06-02 ENCOUNTER — Ambulatory Visit: Payer: Medicare Other | Admitting: Neurology

## 2019-06-02 ENCOUNTER — Encounter: Payer: Self-pay | Admitting: Neurology

## 2019-06-02 VITALS — BP 149/78 | HR 59 | Temp 97.1°F | Ht 63.0 in | Wt 153.0 lb

## 2019-06-02 DIAGNOSIS — G500 Trigeminal neuralgia: Secondary | ICD-10-CM

## 2019-06-02 DIAGNOSIS — F413 Other mixed anxiety disorders: Secondary | ICD-10-CM | POA: Diagnosis not present

## 2019-06-02 DIAGNOSIS — G35 Multiple sclerosis: Secondary | ICD-10-CM

## 2019-06-02 DIAGNOSIS — G35D Multiple sclerosis, unspecified: Secondary | ICD-10-CM

## 2019-06-02 DIAGNOSIS — R42 Dizziness and giddiness: Secondary | ICD-10-CM | POA: Diagnosis not present

## 2019-06-02 DIAGNOSIS — Z79899 Other long term (current) drug therapy: Secondary | ICD-10-CM

## 2019-06-02 MED ORDER — CARBAMAZEPINE 200 MG PO TABS: ORAL_TABLET | ORAL | 4 refills | Status: DC

## 2019-06-02 NOTE — Progress Notes (Signed)
[facility]  PATIENT: Crystal Huber DOB: May 02, 1945  REFERRING DOCTOR OR PCP:  [place]: Patient  _________________________________   HISTORICAL  CHIEF COMPLAINT:  Chief Complaint  Patient presents with  . Follow-up    RM 12, alone. Last seen 12/02/2018. She will be getting covid-19 vaccine (1st dose) 4/8 or 4/9  . Multiple Sclerosis    On Rebif, tolerating well. Ambulates with cane.  . Trigeminal Neuralgia    takes lamotrigine, tegretol. She is stable.     HISTORY OF PRESENT ILLNESS:  Crystal Huber is a 74 y.o. woman with relapsing remittingMS diagnosed in 2002.  Update 06/02/2019: She feels her MS is mostly stable.    She is on Rebif and tolerates it well.    Her gait is doing ok.  She has a cane for balance but goes inside the house without it at times.  She has had vertigo and did vestibular rehab.  She gets dizzy if she gets up quickly.    She is on Lamotrigine 150 mg po tid and Tegretol 200 mg bid to tid for Trigeminal neuralgia and is doing very well this year.   She takes baclofen at night.     She will be doing her Covid vaccination 06/24/2019.   Mood is doing ok with no depression though she has anxiety, probably worse with Covid-19.      Update 12/02/2018: She is on Rebif and tolerates it well.    She was diagnosed with MS in 2002 and her last possible exacerbation was 6 years ago when the trigeminal neuralgia flared (but she has had left TN since shortly after diagnosis)   Tegretol bid and Lamotrigine tid help the TN best.   She increases tegretol to tid if a flare occurs.  Her gait is doing worse since the fracture.   She broke two toes hitting a piece of furniture.  She feels balance is off and she stumbles a lot.   Bladder function is fine.     She denies depression but has some anxiety still.     We discussed possibly stopping Rebif as many people can do without DMT later in life with ,ore chronic  mild MS.    Update 05/27/2018: She feels she is doing well on Rebif and has no new exacerbation.    She tolerates it well.   No recent exacerbation.  Recent Vit D was low normal at 33.    Gait is mildly off balanced and she notes mild dizziness.    Strength is good and she denies any sensory issues in her limbs.   She has trigeminal neuralgia and does well with carbamazepine and lamotrigine.   Her sodium was very low at 123 but recheck is much better at 128.     She has urinary urgency but no incontinence.    Vision is doing well.     She has glaucoma and sees opthalmology regularly.   She notes mild depression at times.  She takes Wellbutrin.     Update 11/19/2017: She is doing well on Rebif and tolerates it well.   She denies any exacerbation.    Gait is ok.   Sometimes she feels mildly off balanced when she gets up and walks.   Meclizine has helped this.    She gets dizzy sometimes when she turns or moves rapidly.  She has had a little right hand tremor and is worried about PD (her husband had it).  She saw ENT and was felt to have Eustacian tube dysfunction at first..   She was advised to take meclizine and Flonase and take Imitrex if she had a worse episode.   Her 2018 brain MRI does not show mastoid effusion or sinusitis.    She denies any new numbness or weakness.  Bladder function is doing well.  Vision is fine.  She has had some depression but feels that it is minimal currently.   Her trigeminal neuralgia is doing much better.    The combination of Carbamezapine and lamotrigine is helping.   She takes baclofen if needed (not recently).      Update 05/19/2017:  For the most part, she feels stable and continues on Rebif.    In the mornings, she feels lightheaded and off balanced and sometimes needs to hold on.   She usually lays back down.   She makes sure that she waits a little before walking after she stands up.   She does not feels like she is going to pass out.   Some days, this does not  occur and some days it may occur later in the day.   This has been occurring for several months but worse the past month.   She saw ENT and was told she has some left hearing loss and that dizziness may be form Eustachian tube dysfunction.    A steroid shot did not help much.    Of note, last MRI mid 2018 did not show any mastoid effusions and no evidence of schwannoma.    She is on 3 blood pressure medications including hydralazine, metoprolol and an ARB   Her Trigeminal Neuralgia is doing well.  She is on CBZ bid and lamotrigine tid.    This regimen has helped her and she will take an extra CBZ if it flares up any.   No major flares lately.   She has mild bladder urgency.     Update 01/29/2017:   She feels her MS is stable on Rebif.    She tolerates it well and has no concerning side effects.   She feels gait, strength, sensation are all baseline.    Her balance is mildly off but this is mostly stable and she holds on with stairs.    Vision is fine.  Bladder function is ok most of the time but there is sometimes hesitancy.     She notes mile fatigue and mild cognitive issues.   She had HTN and med's were changed.   She was told to cut down om the Tegretol (and she cut dose in half).   For her TN, she is taking lamotrigine 75 po tid (was 3 x 150 mg before July) and Tegretol 100 mg tid (was 200 mg po tid).   Her last levels on the old dose showed low lamotrigine (2.2) and therapeutic CBZ (6.6).      From 02/11/2017: MS:   She feels mostly stable.    She is on Rebif.   She uses the Rebiject II and prefers not to change to the new autoinjector.   She tolerates Rebif well and she has not had recent exacerbation.    She has some skin reactions  Trigeminal Neuralgia:  This is doing ok again .   Pain is in the distribution of the V2 neurotome    She is taking 3 x  200 mg tegretols and 3 x 150 mg Lamictal and baclofen 10 mg nightly (too sleepy if taken  during the day).   When it was severe in 2016, the  Sphenocath procedure helped.   She has less dizziness on lower dose of  Lamotrigine .   Meclizine also helps the dizziness  Gait/Strength/sensation:  Gait is mildly of balanced. She needs to hold onto a side-rail with steps and sometimes holds the furniture or walls.    She has not fallen recently.  She denies any numbness or weakness in her legs.   Bladder:  She notes a little bit of bladder urgency at times and will have rare stress incontinence. She also has mild hesitancy but feels she emptie.  . She denies any recent urinary tract infections.  Vision:   She denies any visual problems.  Fatigue:  She notes  fatigue is stable but worsens as the day goes on.  This is better since reducing Tegretol from 4 to 3 pills    This is both physical and cognitive. Caffeine helps some.  She falls asleep easily on low dose Xanax.    Mood/cognition:      She denies any significant depression.    Her anxiety is mild most days.    She notes mild cognitive dysfunction and has noted some verbal fluency issues and also has mild short-term memory issues at times.    She drinks one glass of wine most nights.     MS History:   She had MRI and LP consistent with the diagnosis of MS in 2002 with trigeminal neuralgia.   She was started on Rebif.   She is on Rebif 22 mcg, tolerates it well and has had no definite exacerbation.  Last MRI was 2-3 years ago.    Her last MRI was 05/25/14 and showed no acute findings.    REVIEW OF SYSTEMS: Constitutional: No fevers, chills, sweats, or change in appetite.   Fatigue, worse as the day goes on. Eyes: No visual changes, double vision, eye pain Ear, nose and throat: No hearing loss, ear pain, nasal congestion, sore throat Cardiovascular: No chest pain, palpitations Respiratory: No shortness of breath at rest or with exertion.   No wheezes GastrointestinaI: No nausea, vomiting, diarrhea, abdominal pain, fecal incontinence Genitourinary: No dysuria, urinary retention or frequency.   No nocturia. Musculoskeletal: No neck pain, back pain Integumentary: No rash, pruritus, skin lesions Neurological: as above Psychiatric: Some depression and anxiety. Endocrine: No palpitations, diaphoresis, change in appetite, change in weigh or increased thirst Hematologic/Lymphatic: No anemia, purpura, petechiae..   Bruises easily Allergic/Immunologic: No itchy/runny eyes, nasal congestion, recent allergic reactions, rashes  ALLERGIES: Allergies  Allergen Reactions  . Metronidazole Nausea Only  . Penicillins Swelling    Yeast infection  . Statins     Gets hyponatremia and severe muscle pain  . Sulfa Antibiotics Swelling    yeast infection    HOME MEDICATIONS:  Current Outpatient Medications:  .  ALPRAZolam (XANAX) 0.5 MG tablet, Take 0.25 mg by mouth at bedtime. , Disp: , Rfl:  .  baclofen (LIORESAL) 10 MG tablet, TAKE ONE TABLET BY MOUTH THREE TIMES DAILY , Disp: 90 tablet, Rfl: 2 .  calcium citrate-vitamin D (CITRACAL+D) 315-200 MG-UNIT tablet, Take 1 tablet by mouth 2 (two) times daily., Disp: , Rfl:  .  carbamazepine (TEGRETOL) 200 MG tablet, Take up to three pills a day as directed, Disp: 270 tablet, Rfl: 4 .  citalopram (CELEXA) 20 MG tablet, TAKE ONE TABLET BY MOUTH DAILY, Disp: , Rfl:  .  hydrALAZINE (APRESOLINE) 50 MG tablet, Take 50 mg by  mouth., Disp: , Rfl:  .  HYDROcodone-acetaminophen (NORCO/VICODIN) 5-325 MG tablet, Take 1 tablet every 6 (six) hours as needed by mouth for moderate pain., Disp: 30 tablet, Rfl: 0 .  lactobacillus acidophilus (BACID) TABS tablet, Take 1 tablet by mouth daily., Disp: , Rfl:  .  lamoTRIgine (LAMICTAL) 150 MG tablet, TAKE ONE TABLET BY MOUTH THREE TIMES DAILY , Disp: 270 tablet, Rfl: 3 .  latanoprost (XALATAN) 0.005 % ophthalmic solution, Place 1 drop into both eyes at bedtime., Disp: , Rfl:  .  losartan (COZAAR) 100 MG tablet, TAKE ONE TABLET BY MOUTH DAILY, Disp: , Rfl:  .  meclizine (ANTIVERT) 25 MG tablet, , Disp: , Rfl:  .   metoprolol succinate (TOPROL-XL) 50 MG 24 hr tablet, Take 50 mg by mouth., Disp: , Rfl:  .  olmesartan (BENICAR) 20 MG tablet, Take 20 mg by mouth., Disp: , Rfl:  .  Omega-3 Fatty Acids (FISH OIL PO), Take 1,400 mg by mouth daily., Disp: , Rfl:  .  REBIF 22 MCG/0.5ML SOSY, INJECT 22MCG INTO THE SKIN 3 TIMES A WEEK, Disp: 12 Syringe, Rfl: 11 .  vitamin B-12 (CYANOCOBALAMIN) 100 MCG tablet, Place under the tongue. Reported on 04/14/2015, Disp: , Rfl:  .  buPROPion (WELLBUTRIN XL) 150 MG 24 hr tablet, Take 150 mg by mouth., Disp: , Rfl:  .  omeprazole (PRILOSEC) 40 MG capsule, Take 40 mg by mouth., Disp: , Rfl:   Current Facility-Administered Medications:  .  0.9 %  sodium chloride infusion, 500 mL, Intravenous, Continuous, Nandigam, Venia Minks, MD  PAST MEDICAL HISTORY: Past Medical History:  Diagnosis Date  . Arthritis    right pinkie  . 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 Cataract    right eye  . GERD (gastroesophageal reflux disease)    prn tums, mild not often  . Glaucoma   . Hyperlipidemia   . Hypertension   . Multiple sclerosis ([place])   . Neuromuscular disorder ([facility])    trigeminal neuralgia  . Refusal of blood product     PAST SURGICAL HISTORY: Past Surgical History:  Procedure Laterality Date  . BREAST LUMPECTOMY Right    wears prosthesis  . COLONOSCOPY  12/20/2010  . ganglion cyst removal Bilateral   . PARTIAL HYSTERECTOMY    . POLYPECTOMY      FAMILY HISTORY: Family History  Problem Relation Age of Onset  . Congestive Heart Failure Mother   . Heart attack Father   . Stroke Sister   . Breast cancer Sister   . Heart disease Brother   . Heart disease Sister   . Heart disease Brother   . Heart disease Brother   . Colon cancer Neg Hx   . Colon polyps Neg Hx     SOCIAL HISTORY:  Social History   Socioeconomic History  . Marital status: Married    Spouse name: Not on file  . Number of children: Not on file  . Years of education: Not on file  . Highest  education level: Not on file  Occupational History  . Not on file  Tobacco Use  . Smoking status: Former Research scientist (life sciences)  . Smokeless tobacco: Never Used  Substance and Sexual Activity  . Alcohol use: Yes    Alcohol/week: 0.0 standard drinks    Comment: 1-2 glasses of wine at night/fim  . Drug use: No  . Sexual activity: Not on file  Other Topics Concern  . Not on file  Social History Narrative  .  Not on file   Social Determinants of Health   Financial Resource Strain:   . Difficulty of Paying Living Expenses:   Food Insecurity:   . Worried About Charity fundraiser in the Last Year:   . Arboriculturist in the Last Year:   Transportation Needs:   . Film/video editor (Medical):   Marland Kitchen Lack of Transportation (Non-Medical):   Physical Activity:   . Days of Exercise per Week:   . Minutes of Exercise per Session:   Stress:   . Feeling of Stress :   Social Connections:   . Frequency of Communication with Friends and Family:   . Frequency of Social Gatherings with Friends and Family:   . Attends Religious Services:   . Active Member of Clubs or Organizations:   . Attends Archivist Meetings:   Marland Kitchen Marital Status:   Intimate Partner Violence:   . Fear of Current or Ex-Partner:   . Emotionally Abused:   Marland Kitchen Physically Abused:   . Sexually Abused:      PHYSICAL EXAM  Vitals:   06/02/19 1307  BP: (!) 149/78  Pulse: (!) 59  Temp: (!) 97.1 F (36.2 C)  Weight: 153 lb (69.4 kg)  Height: 5\' 3"  (1.6 m)    Body mass index is 27.1 kg/m.    General: The patient is well-developed and well-nourished and in no acute distress  Neck:  The neck is nontender and has good range of motion.  Neurologic Exam  Mental status: The patient is alert and oriented x 3 at the time of the examination. The patient has apparent normal recent and remote memory, with an apparently normal attention span and concentration ability.   Speech is normal.  Cranial nerves: Extraocular movements are  full.  Symmetric facial strength.  Trapezius strength was normal.  She has reduced left-sided hearing.. The Weber does not lateralize.   Motor: Tremor not notable. Muscle bulk is normal.   Tone is normal.  Specifically, there is no cogwheeling.  Strength is  5 / 5 in all 4 extremities.   Sensory: She has normal symmetric sensation to touch and vibration.  Coordination: Cerebellar testing shows good finger nose finger and heel to shin.   Gait and station: Station is normal.   The gait is slightly wide.  Tandem gait is moderately wide.    There is no retropulsion..   Romberg is negative...    Reflexes: Deep tendon reflexes are symmetric and normal bilaterally.        ASSESSMENT AND PLAN  Multiple sclerosis ([place]) - Plan: Comprehensive metabolic panel, CBC with Differential/Platelet  Trigeminal neuralgia  Other mixed anxiety disorders  Vertigo  High risk medication use - Plan: Comprehensive metabolic panel, CBC with Differential/Platelet   1.    Continue Rebif.  We will check some lab work today..   2.    She will continue Lamotrigine 150 mg po tid and Tegretol 200 mg bid to tid for Trigeminal neuralgia.   Renew Tegretol if worsens can take prn Baclofen.     3.    Stay active and exercise as tolerated. 4.    Rtc 6 months, sooner if problems   Delfina Schreurs A. Felecia Shelling, MD, PhD [id number], 0000000 PM Certified in Neurology, Clinical Neurophysiology, Sleep Medicine, Pain Medicine and Neuroimaging  [facility] [address] [place], [place] [address] [phone]

## 2019-06-03 ENCOUNTER — Telehealth: Payer: Self-pay | Admitting: *Deleted

## 2019-06-03 LAB — COMPREHENSIVE METABOLIC PANEL
ALT: 14 IU/L (ref 0–32)
AST: 21 IU/L (ref 0–40)
Albumin/Globulin Ratio: 2 (ref 1.2–2.2)
Albumin: 4.2 g/dL (ref 3.7–4.7)
Alkaline Phosphatase: 124 IU/L — ABNORMAL HIGH (ref 39–117)
BUN/Creatinine Ratio: 20 (ref 12–28)
BUN: 10 mg/dL (ref 8–27)
Bilirubin Total: 0.3 mg/dL (ref 0.0–1.2)
CO2: 23 mmol/L (ref 20–29)
Calcium: 8.5 mg/dL — ABNORMAL LOW (ref 8.7–10.3)
Chloride: 90 mmol/L — ABNORMAL LOW (ref 96–106)
Creatinine, Ser: 0.5 mg/dL — ABNORMAL LOW (ref 0.57–1.00)
GFR calc Af Amer: 111 mL/min/{1.73_m2} (ref >59)
GFR calc non Af Amer: 96 mL/min/{1.73_m2} (ref >59)
Globulin, Total: 2.1 g/dL (ref 1.5–4.5)
Glucose: 86 mg/dL (ref 65–99)
Potassium: 4.4 mmol/L (ref 3.5–5.2)
Sodium: 125 mmol/L — ABNORMAL LOW (ref 134–144)
Total Protein: 6.3 g/dL (ref 6.0–8.5)

## 2019-06-03 LAB — CBC WITH DIFFERENTIAL/PLATELET
Basophils Absolute: 0.1 10*3/uL (ref 0.0–0.2)
Basos: 1 %
EOS (ABSOLUTE): 0.1 10*3/uL (ref 0.0–0.4)
Eos: 1 %
Hematocrit: 37.3 % (ref 34.0–46.6)
Hemoglobin: 12.6 g/dL (ref 11.1–15.9)
Immature Grans (Abs): 0 10*3/uL (ref 0.0–0.1)
Immature Granulocytes: 0 %
Lymphocytes Absolute: 1.1 10*3/uL (ref 0.7–3.1)
Lymphs: 21 %
MCH: 33.7 pg — ABNORMAL HIGH (ref 26.6–33.0)
MCHC: 33.8 g/dL (ref 31.5–35.7)
MCV: 100 fL — ABNORMAL HIGH (ref 79–97)
Monocytes Absolute: 0.9 10*3/uL (ref 0.1–0.9)
Monocytes: 18 %
Neutrophils Absolute: 3 10*3/uL (ref 1.4–7.0)
Neutrophils: 59 %
Platelets: 274 10*3/uL (ref 150–450)
RBC: 3.74 x10E6/uL — ABNORMAL LOW (ref 3.77–5.28)
RDW: 12.1 % (ref 11.7–15.4)
WBC: 5.2 10*3/uL (ref 3.4–10.8)

## 2019-06-03 NOTE — Telephone Encounter (Signed)
Called pt. Relayed Dr Garth Bigness message. She verbalized understanding and read back directions correctly. She took 1 carbamazepine this am already. She will start the once daily po qd x2wks today. Nothing further needed.

## 2019-06-03 NOTE — Telephone Encounter (Signed)
-----   Message from Britt Bottom, MD sent at 06/03/2019  8:45 AM EDT ----- Sodium is low.   She should stop the Tegretol (do one a day for 2 weeks then stop)  If trigeminal neuralgia pain returns, we will need to try another medication

## 2019-06-08 NOTE — Telephone Encounter (Signed)
Called pt back. Advised she should be taking carbamazepine 200mg  po qdx2 weeks and then stop.  Lamotrigine 150po TID as prescribed. She read back directions correctly to me. She was a little mixed up. She will start this regimen tomorrow. She will call back if any further questions/concerns.

## 2019-06-08 NOTE — Telephone Encounter (Signed)
Pt is needing to speak to RN about these instructions again. Please advise.

## 2019-06-09 NOTE — Telephone Encounter (Signed)
Took call from phone staff and spoke with pt. She wanted to confirm directions again. Relayed again she should take carbamazepine once daily x2 weeks and then stop. She is going to call pharmacy to see if she can cut tablet in half and take 1/2 in the am and pm to equal one per day. She will call back if anything further needed.

## 2019-07-06 ENCOUNTER — Telehealth: Payer: Self-pay | Admitting: Neurology

## 2019-07-06 NOTE — Telephone Encounter (Signed)
Called and LVM returning Donna's call. Provided office number for her to call back

## 2019-07-06 NOTE — Telephone Encounter (Signed)
Donna,RN@ [facility],PA-C's office is asking for a call from Dr Garth Bigness RN to discuss pt's Sodium levels

## 2019-07-07 MED ORDER — IMIPRAMINE HCL 25 MG PO TABS: 25.0000 mg | ORAL_TABLET | Freq: Every day | ORAL | 0 refills | Status: DC

## 2019-07-07 NOTE — Telephone Encounter (Signed)
Took call from phone staff. Spoke with patient. She is till taking carbamazepine 200mg  po qhs, she has not discontinued as instructed. Sodium level checked again this week by PCP and it was 123 per pt. PCP suggested she try drinking gatorade and they will recheck level.  She is concerned about coming off of it. Wants to go to 1/2 tablet po qhs. I tried explaining the importance of stopping it d/t her low sodium level but she is resisitant to it. Wants to know if she can be put on a different medication while she is coming off of carbamazepine.  Advised I will speak with MD and call her back.

## 2019-07-07 NOTE — Telephone Encounter (Signed)
Took call from phone staff and spoke with pt. She is agreeable to stop carbamazepine and start imipramine. Instructed her to take 25mg  po qhs. She would like rx to be called into [facility], [place] on [address]. I e-scribed rx.   I called [facility] at [phone] and cx any remaining refills/prescriptions on file for carbamazepine. Spoke with pharmacist. They cx.

## 2019-07-07 NOTE — Telephone Encounter (Signed)
She can go on imipramine 25 mg po qHS instead of carbamazepine

## 2019-07-07 NOTE — Addendum Note (Signed)
Addended by: Wyvonnia Lora on: 07/07/2019 01:34 PM   Modules accepted: Orders

## 2019-07-07 NOTE — Telephone Encounter (Signed)
Called, LVM relaying Dr. Garth Bigness recommendation. Asked her to call back if she is ok to proceed with plan.

## 2019-07-08 NOTE — Telephone Encounter (Signed)
[name]- did you want to call and speak with her? I have spoken with her on 4 separate occassions about coming off her carbamazepine. She was agreeable yesterday to switch to imipramine but now is calling back hesitant about this plan.

## 2019-07-08 NOTE — Telephone Encounter (Signed)
I spoke to Crystal Huber and clarified her medications.  She will be stopping the Tegretol (due to hyponatremia as low as 123) she will continue lamotrigine 150 mg 3 times a day and add imipramine 25 mg.  If pain worsens she can go up to 2 imipramine's (50 mg total) but should not take anymore.  She will let us know if she has any trouble.

## 2019-07-08 NOTE — Telephone Encounter (Signed)
Pt has called asking for [name],RN to call her to discuss her worrying about her pain on this new medication imipramine.  Pt aware RN has 24-48 hours to respond to messages and that if not called this evening the office is not open on Fridays, pt asking that Terrence Dupont, RN tries to call today

## 2019-07-21 ENCOUNTER — Telehealth: Payer: Self-pay | Admitting: Neurology

## 2019-07-21 NOTE — Telephone Encounter (Signed)
Called patient back to further discuss. She is taking imipramine 25mg  po qhs. She confirmed she stopped tegretol as instructed. She reports she has had some break through pain but nothing severe. Advised per [name] that she can increase to 2 tabs po qhs (total of 50mg ) but cannot take more than this. She will call PCP office to see when they are planning on rechecking her sodium level. When she sleeps, she is having vivid dreams. She will call back if this worsens but will continue to monitor this.

## 2019-07-21 NOTE — Telephone Encounter (Signed)
Pt called wanting to speak to RN about her imipramine (TOFRANIL) 25 MG tablet Pt states it is working really well but does want to discuss a couple of things that she has noticed since being on it. Please advise.

## 2019-07-22 NOTE — Telephone Encounter (Signed)
The sweating could be coming from the imipramine and she can stop it.  For the time being we will keep the meds.  She is actually on a fairly high dose of lamotrigine but it could be increased to a total of 4 pills a day if she has a flareup

## 2019-07-22 NOTE — Telephone Encounter (Signed)
Called pt back and relayed Dr. Garth Bigness recommendation. She verbalized understanding and will try this. She will call back if she has any future questions/concerns.

## 2019-07-22 NOTE — Telephone Encounter (Signed)
Called pt back.  For the past 3-4 nights she has been having night sweats and more intense dreams. She is only taking 25mg  po qhs, never increased to 50mg . She recently got her second covid-19 vaccination last week and thought this might be related but she is still having sx. She is on day 15 of taking imipramine. Advised I will speak with MD and call her back with his recommendation.

## 2019-07-22 NOTE — Addendum Note (Signed)
Addended by: Wyvonnia Lora on: 07/22/2019 04:48 PM   Modules accepted: Orders

## 2019-07-22 NOTE — Telephone Encounter (Signed)
Patient called stating the imipramine and was experiencing night sweats and states her top around her neck area is wet and has noticed her sheets are a bit damp and was unsure if this was okay or what she should do

## 2019-07-22 NOTE — Telephone Encounter (Signed)
Dr. Sater- please advise 

## 2019-07-22 NOTE — Telephone Encounter (Signed)
Sweating and sometimes a side effect of imipramine.  She can stop the dose back down to 25 mg.  Hopefully this will help.

## 2019-08-09 ENCOUNTER — Telehealth: Payer: Self-pay | Admitting: Neurology

## 2019-08-09 NOTE — Telephone Encounter (Signed)
That's a better sodium since stopping the carbamezapine   Normal is 135 but 132 is safe and would not cause any symptoms.

## 2019-08-09 NOTE — Telephone Encounter (Signed)
Called pt and relayed Dr. Garth Bigness message. She verbalized understanding and appreciation.

## 2019-08-09 NOTE — Telephone Encounter (Signed)
Patient called to advise once she discontinued taking the carbamazepine as suggested she is feeling a lot better. Pt also called to advise she recently had her blood levels checked and her sodium level is 132 but is unsure what kind of salt if she uses any at all should she use

## 2019-08-24 ENCOUNTER — Other Ambulatory Visit: Payer: Self-pay | Admitting: Neurology

## 2019-08-29 ENCOUNTER — Telehealth: Payer: Self-pay | Admitting: Neurology

## 2019-08-30 ENCOUNTER — Telehealth: Payer: Self-pay | Admitting: Neurology

## 2019-08-30 NOTE — Telephone Encounter (Signed)
Patient called LVM for Dr. Jannifer Franklin to call her.   I am helping phone room with VM.

## 2019-08-30 NOTE — Telephone Encounter (Signed)
This patient called on 29 August 2019 regarding increase in trigeminal neuralgia pain, she is on Lamictal for this.  I called her back 3 times, left messages on 2 occasions, the calls immediately went to voicemail, not sure her telephone was working properly.  I left a message for her to call me back if she requires further assistance.  I was never able to contact her.

## 2019-08-30 NOTE — Telephone Encounter (Signed)
Pt returned call. Please call back when available. 

## 2019-08-31 NOTE — Telephone Encounter (Signed)
Pt has called back in response to message from [facility].  Pt states she doesn't know what to do about her pain.  Please call

## 2019-08-31 NOTE — Telephone Encounter (Signed)
Please let her know that she can increase the Lamictal to a maximum of 4 pills a day.

## 2019-08-31 NOTE — Telephone Encounter (Signed)
I contacted the pt and left a vm advising of Dr. Garth Bigness recommendations to increase the Lamictal to 4 pills per day.

## 2019-08-31 NOTE — Telephone Encounter (Signed)
Please refer to telephone note from 30 August 2019.

## 2019-08-31 NOTE — Telephone Encounter (Signed)
I reached out to the pt.  Pt sts she is already taking 4 pills per day of the lamictal and is doing well. She sts she had a bad episode on 08/29/2019 but would like to continue the 4 pills per day for right now.  Pt advised to call back if these episodes start reoccurring.

## 2019-09-28 ENCOUNTER — Telehealth: Payer: Self-pay | Admitting: Neurology

## 2019-09-28 NOTE — Telephone Encounter (Signed)
Patient called asking to give Terrence Dupont a message about her Neuralgia medication. She states it is not working. She is asking for a call back.

## 2019-09-28 NOTE — Telephone Encounter (Signed)
Called pt back to further discuss. She is taking lamictal 150mg  po QID. Feels it is not helping control her trigeminal neuralgia pain completely. Feels it has helped the most out of any other meds she has been on but wondering if there is an alternative she can be put on that would help better control her sx.  Advised I will send a message to MD. Once he replies with recommendation, I will call her back. She verbalized understanding.

## 2019-09-29 MED ORDER — CARBAMAZEPINE 100 MG PO CHEW: 100.0000 mg | CHEWABLE_TABLET | Freq: Two times a day (BID) | ORAL | 5 refills | Status: DC

## 2019-09-29 NOTE — Telephone Encounter (Signed)
We can try carbamazapine 100 mg one po bid    It may also affect sodium but hopefully less than the oxcarbazepine.    It can interact with lamotrigine so we will keep the dose low  #60   5 refill

## 2019-09-29 NOTE — Telephone Encounter (Signed)
Called pt back. She got up at 6am this am. Took lamictal. Still experiencing some pain.  I relayed Dr. Garth Bigness recommendation. She is agreeable to this plan. She is going tomorrow to see PCP and will have her sodium level checked. If abnormal, she will let us know. I e-scribed carbamazepine to [facility] per pt request. She will call back if she has any further questions/concerns.

## 2019-09-29 NOTE — Addendum Note (Signed)
Addended by: Wyvonnia Lora on: 09/29/2019 02:52 PM   Modules accepted: Orders

## 2019-10-14 NOTE — Telephone Encounter (Signed)
Pt is asking for a call from RN to discuss her medications as a result of  trigeminal neuralgia pain in her mouth.  Please call

## 2019-10-14 NOTE — Telephone Encounter (Signed)
Called pt back to further discuss. She is having increased trigeminal neuralgia pain when she goes to bed. She has to prop up head on pillow to get some relief. Taking baclofen and carbamazepine as prescribed. Did not see PCP on 09/30/19, she was not able to go to this appt. Was scheduled for lab appt. She will call today to get this set back up. Advised I will discuss with MD and call her back.  Spoke with [name], he recommends having her increase Baclofen 10mg  tablet to 1 tablet in the am, afternoon and 2 tablets at bedtime. (Increasing from 10mg  po TID). He would like her to go get repeat labs to check her sodium level.

## 2019-10-14 NOTE — Telephone Encounter (Addendum)
Called pt, she answered but could not hear me. I ended call and called back again. She is currently taking baclofen 10mg  po BID. She will increase to take 1 tablet in the afternoon and 2 tablets as bedtime to see if this helps her pain. She will try and get labs completed today at PCP office to check her sodium level. She will call back if anything further is needed.

## 2019-10-19 ENCOUNTER — Telehealth: Payer: Self-pay | Admitting: Neurology

## 2019-10-19 NOTE — Telephone Encounter (Signed)
Called pt back. She states her sodium level came back at 125 that was drawn by PCP on 10/14/19. PCP is having her get repeat labs on 10/22/19. PCP advised her to drink Gatorade prior to this lab draw. She will continue baclofen and take 10mg  in the afteroon and 20mg  at bed (2 tablets). Takes carbamazepine 100mg  po BID and lamotrigine 150mg  po TID. Advised I will send FYI to [name].

## 2019-10-19 NOTE — Telephone Encounter (Signed)
If the sodium remains under 130 she will need to stop the carbamazepine.

## 2019-10-19 NOTE — Telephone Encounter (Signed)
Pt called wanting to speak to the RN to discuss her medications. Please advise. 

## 2019-10-20 NOTE — Telephone Encounter (Signed)
Noted, she is going to f/u with [place] once results come back from PCP

## 2019-10-30 ENCOUNTER — Telehealth: Payer: Self-pay | Admitting: Neurology

## 2019-10-30 MED ORDER — GABAPENTIN 300 MG PO CAPS: 300.0000 mg | ORAL_CAPSULE | Freq: Every day | ORAL | 1 refills | Status: DC

## 2019-10-30 NOTE — Telephone Encounter (Signed)
Keep calling patient at [phone], talked to niece as patient contact. Finally contacted her and she is still on the Carbamazepine but I see notes stating she is supposed to come off of it so please verify with patient this week. In the evening at 8pm the pain starts and hurts severely, can't sleep, waking her up, she sat up from 3am in a chair. She is taking Lamictal 150mg  four times a day. She is having pain mostly at night not during the day. I advised she shouldn't lay on that side while she is sleeping. I will give her Gabapentin at bedtime try one tablet 300mg  can go up to 2 tablets at bedtime.

## 2019-10-30 NOTE — Addendum Note (Signed)
Addended by: Sarina Ill B on: 10/30/2019 11:25 AM   Modules accepted: Orders

## 2019-10-30 NOTE — Telephone Encounter (Signed)
Spoke with patient, see other phone note thanks

## 2019-10-30 NOTE — Telephone Encounter (Signed)
Patient paged on call for severe pain, needing something "stronger". I reviewed chart appears she has trigeminal neuralgia, tried tegretol with hyponatremia, now on lamictal 450mg /day(this can be increased but will defer to [name]). I have called multiple times and goes directly to voicemail. I may suggest adding gabapentin if I speak to patient. thanks

## 2019-10-30 NOTE — Telephone Encounter (Signed)
I was called by the answering service as patient called again. I advised to let patient know I am calling multiple times but it keeps going straight to voicemail. I have left 2 messages and called multiple times.

## 2019-11-01 ENCOUNTER — Telehealth: Payer: Self-pay | Admitting: Neurology

## 2019-11-01 NOTE — Telephone Encounter (Signed)
Pt called to inform [name] today Sodium 127, 2 weeks ago 125. Pt's medical physician suggested going to a kidney physician, but would like to speak the nurse.

## 2019-11-01 NOTE — Telephone Encounter (Signed)
Lets have her do tramadol 50 mg #90   2 refills    1 p.o. 3 times daily as needed

## 2019-11-01 NOTE — Telephone Encounter (Signed)
She should stop the Tegretol     Tegretol and oxcarbazepine are very similar to each other and both can lower the sodium.  Tegretol would oxcarbazepine a very similar to each other and both can lower the sodium.  Lamotrigine and gabapentin should not lower sodium.  We can  have her increase the gabapentin dose as she goes down on the Tegretol.

## 2019-11-01 NOTE — Telephone Encounter (Signed)
Called pt back to see how she was feeling after Dr. Jaynee Eagles prescribed gabapentin 300mg  cap 1-2 po qhs. She states this has helped her sx. She feels she can only tolerate 1 po qhs and will continue this. She will call back if she has new or worsening sx. She has not heard back from PCP about results for repeat sodium level she had done over a week ago. She will call them and then let us know.

## 2019-11-02 MED ORDER — GABAPENTIN 300 MG PO CAPS: 300.0000 mg | ORAL_CAPSULE | Freq: Three times a day (TID) | ORAL | 3 refills | Status: DC

## 2019-11-02 NOTE — Telephone Encounter (Signed)
Spoke with [name]. She can take carbamazepine po once daily for a few days and then stop. She can slowly work up to gabapentin po TID while tapering off carbamazepine.

## 2019-11-02 NOTE — Addendum Note (Signed)
Addended by: Wyvonnia Lora on: 11/02/2019 09:19 AM   Modules accepted: Orders

## 2019-11-02 NOTE — Telephone Encounter (Addendum)
Called pt back and relayed Dr. Garth Bigness recommendation. Advised she should take carbamazepine 100mg  po daily x3 days then stop. She should slowly increase gabapentin 300mg  dose to TID. She currently takes 300mg  po qhs. She will take 1 in the am and 1 in the pm for a couple days and then increase to TID. She is aware to stay on lamotrigine, no changes made to this one. She wrote down directions and read them back x2 correctly. She is seeing her PCP tomorrow to further discuss treatment plan in person. Called [facility] and LVM asking that rx carbamazepine on file be cx for pt. E-scribed new rx for gabapentin dose increase to [facility] per pt request.

## 2019-11-09 ENCOUNTER — Telehealth: Payer: Self-pay | Admitting: Neurology

## 2019-11-09 DIAGNOSIS — G500 Trigeminal neuralgia: Secondary | ICD-10-CM

## 2019-11-09 NOTE — Telephone Encounter (Signed)
Noted, pt has lab appt again with PCP 11/17/19

## 2019-11-09 NOTE — Telephone Encounter (Signed)
Pt would like nurse to know her blood work is due September 1 for my sodium level.

## 2019-11-09 NOTE — Telephone Encounter (Signed)
Pt is asking for a call from [name],RN to discuss her ,Trigeminal neuralgia pain

## 2019-11-09 NOTE — Telephone Encounter (Signed)
Called pt and relayed Dr. Garth Bigness recommendation. She was agreeable to referral. I placed and pt aware they will call to get her scheduled.

## 2019-11-09 NOTE — Telephone Encounter (Signed)
Since trigeminal neuralgia is worsening, I would like her to see the [facility] in [place] to see if an injection or other procedure for trigeminal neuralgia will help her

## 2019-11-09 NOTE — Telephone Encounter (Signed)
Called pt back to get further information. She is now on gabapentin 300mg  po TID and confirmed she is still on lamotrigine 150mg  po TID. She has tapered off carbamazepine as instructed d/t her low sodium level. She has only been on increased dose of gabapentin two days now. She states her sx have worsened. Taking extra dose of lamotrigine has not helped. She states it worsens when she lays down. Advised that since she has only been on increased dose of gabapentin for two days, she should give it more time to work. Takes time to build up in system and reach full effect. She will try to take it for another week but if sx do not improve, she will call back to let us know.

## 2019-11-09 NOTE — Addendum Note (Signed)
Addended by: Wyvonnia Lora on: 11/09/2019 01:51 PM   Modules accepted: Orders

## 2019-11-10 ENCOUNTER — Telehealth: Payer: Self-pay | Admitting: Neurology

## 2019-11-10 NOTE — Telephone Encounter (Signed)
[name]- what else would you recommend? I did place referral to  [facility].

## 2019-11-10 NOTE — Telephone Encounter (Signed)
Pt called stating that last night was a very bad night for her due to her Trigeminal neuralgia pain and she is wanting to discuss several things with the RN when she is available. Please advise.

## 2019-11-10 NOTE — Telephone Encounter (Signed)
Baclofen can also help trigeminal neuralgia pain.  She can increase it to 4 or 5 pills a day.

## 2019-11-11 ENCOUNTER — Telehealth: Payer: Self-pay | Admitting: *Deleted

## 2019-11-11 NOTE — Telephone Encounter (Signed)
Called and LVM for pt relaying Dr. Garth Bigness recommendation. Asked her to call back if increased dose of Baclofen does not help improve her trigeminal neuralgia pain.

## 2019-11-11 NOTE — Telephone Encounter (Signed)
Took call from phone room and spoke with patient. Relayed again that [name] recommends she take baclofen 10mg  po 4 or 5 pills per day. She verbalized understanding. She is going to try and go to bed a little earlier and see if this helps. She is aware to be taking gabapentin and lamotrigine po TID. Reminded her that we placed referral to [facility]. They will be calling her to get an appt set up. She verbalized understanding and states a friend of hers as agreed to take her to appt once she gets scheduled.

## 2019-11-11 NOTE — Telephone Encounter (Signed)
Error

## 2019-11-23 ENCOUNTER — Telehealth: Payer: Self-pay | Admitting: Neurology

## 2019-11-23 NOTE — Telephone Encounter (Signed)
FYI

## 2019-11-23 NOTE — Telephone Encounter (Signed)
Pt called wanting to speak to the RN to discuss her medications. Please advise.

## 2019-11-23 NOTE — Telephone Encounter (Signed)
Called patient back. She had a question about the baclofen. Feels it is making her too sleepy for 3-4 per day. She will try to decrease to 2 per day to see if she will tolerate it better. Has not heard from [facility] to schedule an appointment. She will call them at [phone] to schedule an appt.  She had sodium level rechecked at PCP office 11/17/2019. Has not heard about results, she will call PCP about these results.

## 2019-11-23 NOTE — Telephone Encounter (Signed)
Pt called back and stated that she has an appt with the pain management doctor on 9/14 @12 :30 and as soon as she gets her Sodium level's she will call back with them.

## 2019-12-03 DIAGNOSIS — R001 Bradycardia, unspecified: Secondary | ICD-10-CM | POA: Insufficient documentation

## 2019-12-03 DIAGNOSIS — I499 Cardiac arrhythmia, unspecified: Secondary | ICD-10-CM | POA: Insufficient documentation

## 2019-12-06 ENCOUNTER — Encounter: Payer: Self-pay | Admitting: Neurology

## 2019-12-06 ENCOUNTER — Ambulatory Visit (INDEPENDENT_AMBULATORY_CARE_PROVIDER_SITE_OTHER): Payer: Medicare Other | Admitting: Neurology

## 2019-12-06 VITALS — BP 155/78 | HR 60 | Ht 63.0 in | Wt 150.0 lb

## 2019-12-06 DIAGNOSIS — R269 Unspecified abnormalities of gait and mobility: Secondary | ICD-10-CM

## 2019-12-06 DIAGNOSIS — G500 Trigeminal neuralgia: Secondary | ICD-10-CM | POA: Diagnosis not present

## 2019-12-06 DIAGNOSIS — G35 Multiple sclerosis: Secondary | ICD-10-CM | POA: Diagnosis not present

## 2019-12-06 DIAGNOSIS — F413 Other mixed anxiety disorders: Secondary | ICD-10-CM

## 2019-12-06 DIAGNOSIS — R42 Dizziness and giddiness: Secondary | ICD-10-CM

## 2019-12-06 NOTE — Progress Notes (Signed)
[facility]  PATIENT: Crystal Huber DOB: September 30, 1945  REFERRING DOCTOR OR PCP:  [place]: Patient  _________________________________   HISTORICAL  CHIEF COMPLAINT:  Chief Complaint  Patient presents with  . Follow-up    RM 13, alone. Last seen 06/02/19.   . Multiple Sclerosis    On Rebif  . Trigeminal Neuralgia    on lamotrigine, gabapentin, baclofen    HISTORY OF PRESENT ILLNESS:  Crystal Huber is a 74 y.o. woman with relapsing remitting MS diagnosed in 2002.  Update 12/06/19: She feels her MS is mostly stable.    She is on Rebif and tolerates it well.  No new exacerbation  She is on Lamotrigine 150 mg po tid and gabapentin 300 mg po tid Trigeminal neuralgia and is doing very well this year.   She takes baclofen at night as daytime makes her sleepy.  We discussed taking an extra gabapentin if she flares up.   She has seen Dr. Mechele Dawley at the [facility] and she will consider a procedure if pain intensifies.     Her gait is doing the same and she uses a cane when outside her home and sometimes inside.   She has had vertigo and did vestibular rehab.  She gets dizzy if she gets up quickly.  She notes mild foot numbness on the right at times.   She will be doing her Covid vaccination 06/24/2019.   Mood is doing ok with no depression though she has anxiety, probably worse with Covid-19.       MS History:   She had MRI and LP consistent with the diagnosis of MS in 2002 after presenting with trigeminal neuralgia.   She was started on Rebif.   She is on Rebif 22 mcg, tolerates it well and has had no definite exacerbation since.     MRI in 2018 showed a few foci not present in 2012.  REVIEW OF SYSTEMS: Constitutional: No fevers, chills, sweats, or change in appetite.   Fatigue, worse as the day goes on. Eyes: No visual changes, double vision, eye pain Ear, nose and throat: No hearing loss, ear pain, nasal congestion, sore  throat Cardiovascular: No chest pain, palpitations Respiratory: No shortness of breath at rest or with exertion.   No wheezes GastrointestinaI: No nausea, vomiting, diarrhea, abdominal pain, fecal incontinence Genitourinary: No dysuria, urinary retention or frequency.  No nocturia. Musculoskeletal: No neck pain, back pain Integumentary: No rash, pruritus, skin lesions Neurological: as above Psychiatric: Some depression and anxiety. Endocrine: No palpitations, diaphoresis, change in appetite, change in weigh or increased thirst Hematologic/Lymphatic: No anemia, purpura, petechiae..   Bruises easily Allergic/Immunologic: No itchy/runny eyes, nasal congestion, recent allergic reactions, rashes  ALLERGIES: Allergies  Allergen Reactions  . Carbamazepine     Decreased sodium level  . Imipramine     Night sweats, more intense dreams  . Metronidazole Nausea Only  . Oxcarbazepine     Decreased sodium level  . Penicillins Swelling    Yeast infection  . Statins     Gets hyponatremia and severe muscle pain  . Sulfa Antibiotics Swelling    yeast infection    HOME MEDICATIONS:  Current Outpatient Medications:  .  ALPRAZolam (XANAX) 0.5 MG tablet, Take 0.25 mg by mouth at bedtime. , Disp: , Rfl:  .  baclofen (LIORESAL) 10 MG tablet, TAKE ONE TABLET BY MOUTH THREE TIMES DAILY  (Patient taking differently: Take 10 mg by mouth 2 (two) times daily. ), Disp:  90 tablet, Rfl: 2 .  calcium citrate-vitamin D (CITRACAL+D) 315-200 MG-UNIT tablet, Take 1 tablet by mouth 2 (two) times daily., Disp: , Rfl:  .  citalopram (CELEXA) 20 MG tablet, TAKE ONE TABLET BY MOUTH DAILY, Disp: , Rfl:  .  gabapentin (NEURONTIN) 300 MG capsule, Take 1 capsule (300 mg total) by mouth 3 (three) times daily., Disp: 90 capsule, Rfl: 3 .  hydrALAZINE (APRESOLINE) 50 MG tablet, Take 50 mg by mouth., Disp: , Rfl:  .  HYDROcodone-acetaminophen (NORCO/VICODIN) 5-325 MG tablet, Take 1 tablet every 6 (six) hours as needed by  mouth for moderate pain., Disp: 30 tablet, Rfl: 0 .  lactobacillus acidophilus (BACID) TABS tablet, Take 1 tablet by mouth daily., Disp: , Rfl:  .  lamoTRIgine (LAMICTAL) 150 MG tablet, TAKE ONE TABLET BY MOUTH THREE TIMES DAILY , Disp: 270 tablet, Rfl: 3 .  latanoprost (XALATAN) 0.005 % ophthalmic solution, Place 1 drop into both eyes at bedtime., Disp: , Rfl:  .  losartan (COZAAR) 100 MG tablet, TAKE ONE TABLET BY MOUTH DAILY, Disp: , Rfl:  .  meclizine (ANTIVERT) 25 MG tablet, , Disp: , Rfl:  .  metoprolol succinate (TOPROL-XL) 50 MG 24 hr tablet, Take 50 mg by mouth., Disp: , Rfl:  .  olmesartan (BENICAR) 20 MG tablet, Take 20 mg by mouth., Disp: , Rfl:  .  Omega-3 Fatty Acids (FISH OIL PO), Take 1,400 mg by mouth daily., Disp: , Rfl:  .  REBIF 22 MCG/0.5ML SOSY, INJECT 22MCG INTO THE SKIN 3 TIMES A WEEK, Disp: 6 mL, Rfl: 11 .  vitamin B-12 (CYANOCOBALAMIN) 100 MCG tablet, Place under the tongue. Reported on 04/14/2015, Disp: , Rfl:  .  buPROPion (WELLBUTRIN XL) 150 MG 24 hr tablet, Take 150 mg by mouth., Disp: , Rfl:  .  omeprazole (PRILOSEC) 40 MG capsule, Take 40 mg by mouth., Disp: , Rfl:   Current Facility-Administered Medications:  .  0.9 %  sodium chloride infusion, 500 mL, Intravenous, Continuous, Nandigam, Venia Minks, MD  PAST MEDICAL HISTORY: Past Medical History:  Diagnosis Date  . Arthritis    right pinkie  . 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 Cataract    right eye  . GERD (gastroesophageal reflux disease)    prn tums, mild not often  . Glaucoma   . Hyperlipidemia   . Hypertension   . Multiple sclerosis ([place])   . Neuromuscular disorder (HCC)    trigeminal neuralgia  . Refusal of blood product     PAST SURGICAL HISTORY: Past Surgical History:  Procedure Laterality Date  . BREAST LUMPECTOMY Right    wears prosthesis  . COLONOSCOPY  12/20/2010  . ganglion cyst removal Bilateral   . PARTIAL HYSTERECTOMY    . POLYPECTOMY      FAMILY HISTORY: Family History   Problem Relation Age of Onset  . Congestive Heart Failure Mother   . Heart attack Father   . Stroke Sister   . Breast cancer Sister   . Heart disease Brother   . Heart disease Sister   . Heart disease Brother   . Heart disease Brother   . Colon cancer Neg Hx   . Colon polyps Neg Hx     SOCIAL HISTORY:  Social History   Socioeconomic History  . Marital status: Married    Spouse name: Not on file  . Number of children: Not on file  . Years of education: Not on file  . Highest education level:  Not on file  Occupational History  . Not on file  Tobacco Use  . Smoking status: Former Research scientist (life sciences)  . Smokeless tobacco: Never Used  Substance and Sexual Activity  . Alcohol use: Yes    Alcohol/week: 0.0 standard drinks    Comment: 1-2 glasses of wine at night/fim  . Drug use: No  . Sexual activity: Not on file  Other Topics Concern  . Not on file  Social History Narrative  . Not on file   Social Determinants of Health   Financial Resource Strain:   . Difficulty of Paying Living Expenses: Not on file  Food Insecurity:   . Worried About Charity fundraiser in the Last Year: Not on file  . Ran Out of Food in the Last Year: Not on file  Transportation Needs:   . Lack of Transportation (Medical): Not on file  . Lack of Transportation (Non-Medical): Not on file  Physical Activity:   . Days of Exercise per Week: Not on file  . Minutes of Exercise per Session: Not on file  Stress:   . Feeling of Stress : Not on file  Social Connections:   . Frequency of Communication with Friends and Family: Not on file  . Frequency of Social Gatherings with Friends and Family: Not on file  . Attends Religious Services: Not on file  . Active Member of Clubs or Organizations: Not on file  . Attends Archivist Meetings: Not on file  . Marital Status: Not on file  Intimate Partner Violence:   . Fear of Current or Ex-Partner: Not on file  . Emotionally Abused: Not on file  . Physically  Abused: Not on file  . Sexually Abused: Not on file     PHYSICAL EXAM  Vitals:   12/06/19 1135  BP: (!) 155/78  Pulse: 60  Weight: 150 lb (68 kg)  Height: 5\' 3"  (1.6 m)    Body mass index is 26.57 kg/m.    General: The patient is well-developed and well-nourished and in no acute distress  Neck:  The neck has good range of motion.  Neurologic Exam  Mental status: The patient is alert and oriented x 3 at the time of the examination. The patient has apparent normal recent and remote memory, with an apparently normal attention span and concentration ability.   Speech is normal.  Cranial nerves: Extraocular movements are full. No nystagmus.   Symmetric facial strength.  Symmetric sensation to temperature.  Trapezius strength was normal.  She has reduced left-sided hearing.. The Weber does not lateralize.   Motor: Tremor not notable. Muscle bulk is normal.   Tone is normal.  Specifically, there is no cogwheeling.  Strength is  5 / 5 in all 4 extremities.   Sensory: She has normal symmetric sensation to touch and vibration.  Coordination: Cerebellar testing shows good finger nose finger and heel to shin.   Gait and station: Station is normal.   The gait is slightly wide.  Tandem gait is moderately wide.    There is no retropulsion..   Romberg is negative...    Reflexes: Deep tendon reflexes are symmetric and normal bilaterally.        ASSESSMENT AND PLAN  Multiple sclerosis (HCC)  Trigeminal neuralgia  Vertigo  Gait disorder  Other mixed anxiety disorders   1.    Continue Rebif.  We will check some lab work today..   2.    She will continue Lamotrigine 150 mg po tid and  gabapentin tid for Trigeminal neuralgia.   Can take prn Baclofen.    If major flare can see Dr. Mechele Dawley for injection/procedure.    3.    Stay active and exercise as tolerated. 4.    Rtc 6 months, sooner if problems   Crystal Huber A. Felecia Shelling, MD, PhD 7/94/3276, 14:70 AM Certified in Neurology, Clinical  Neurophysiology, Sleep Medicine, Pain Medicine and Neuroimaging  [facility] [address] [place], [place] [address] [phone]

## 2019-12-07 ENCOUNTER — Other Ambulatory Visit: Payer: Self-pay | Admitting: Neurology

## 2019-12-28 ENCOUNTER — Ambulatory Visit: Payer: Medicare Other | Attending: Internal Medicine

## 2019-12-28 DIAGNOSIS — Z23 Encounter for immunization: Secondary | ICD-10-CM

## 2019-12-28 NOTE — Progress Notes (Signed)
   Covid-19 Vaccination Clinic  Name:  EUVA RUNDELL    MRN: [id number] DOB: 03/01/1946  12/28/2019  Ms. Nazareno was observed post Covid-19 immunization for 15 minutes without incident. She was provided with Vaccine Information Sheet and instruction to access the V-Safe system.   Ms. Debord was instructed to call 911 with any severe reactions post vaccine: Marland Kitchen Difficulty breathing  . Swelling of face and throat  . A fast heartbeat  . A bad rash all over body  . Dizziness and weakness

## 2020-01-26 LAB — LIPID PANEL
HDL: 104 — AB (ref 35–70)
LDL Cholesterol: 105
Triglycerides: 62 (ref 40–160)

## 2020-02-07 ENCOUNTER — Telehealth: Payer: Self-pay | Admitting: Neurology

## 2020-02-07 NOTE — Telephone Encounter (Signed)
Pt called and LVM stating that she is having side effects on the gabapentin (NEURONTIN) 300 MG capsule and is needing to speak to the RN. Please advise.

## 2020-02-07 NOTE — Telephone Encounter (Signed)
Called, LVM for pt returning her call. 

## 2020-02-08 NOTE — Telephone Encounter (Signed)
LVM for pt again to call office back.

## 2020-02-08 NOTE — Telephone Encounter (Addendum)
Took call from phone staff and spoke with pt. Reports "eyes are not the same/not functioning right. I want to sleep all the time. Very forgetful. Drooling a little." Main thing: Last few weeks, she has been having hallucinations. She thinks someone is in the house/someone trying to speak with her. Gabapentin helps a lot with pain but she thought this was causing sx so she stopped it about a week ago. She was on this medication for about 2 months.  This week: two nights ago, she got confused when she woke up/had visual/auditory hallucination. Though someone was in the bed/saw young women. Thought heard someone say "get out" Last night, she had a female frined come over and spend the night with her. She did not have any hallucinations then. She has not started any other medications recently. Wanting to know what [name] recommends. Advised I will speak with MD and call her back.

## 2020-02-08 NOTE — Telephone Encounter (Signed)
[name]- this is what I mentioned earlier. Please advise

## 2020-02-09 ENCOUNTER — Other Ambulatory Visit: Payer: Self-pay | Admitting: *Deleted

## 2020-02-09 NOTE — Telephone Encounter (Signed)
Agree with her stopping the gabapentin.  If symptoms persist she can make an earlier follow-up with me

## 2020-02-09 NOTE — Telephone Encounter (Signed)
Called, LVM for pt relaying Dr. Garth Bigness message. Advised her to call back if she has any further questions/concerns.

## 2020-02-24 ENCOUNTER — Telehealth: Payer: Self-pay | Admitting: Neurology

## 2020-02-24 NOTE — Telephone Encounter (Signed)
Called and spoke with Tammy (on DPR). Unsure if medicine is causing increased cognitive issues or if it is her MS. PCP advised pt would need to f/u with [name] on this. Trying to get her to not drive, feels it is unsafe for her at this point. Harder for them to have conversations with her. She would like pt to see [name] sooner for appt. States it is not urgent. Scheduled appt for 03/27/20 at 1:30pm. Cx original appt that was in March 2022. Pt has appt with PCP on 03/03/20. Daughter will have them check labs/UA to r/o other other causes for increased confusion.

## 2020-02-24 NOTE — Telephone Encounter (Signed)
Pt's niece Mina Marble on [place] called and LVM wanting to speak to the RN regarding the pt's cognitive issues. Tammy would like to know if its due to the MS or if the pt is developing Dementia. Please advise.

## 2020-03-02 ENCOUNTER — Telehealth: Payer: Self-pay | Admitting: Neurology

## 2020-03-02 NOTE — Telephone Encounter (Signed)
Called pt back. Advised it would be best for her to f/u with PCP/dentist for this. She stated she already has spoken with dentist. She told them she wanted to discuss things with [name] first prior to moving forward with any procedure with them d/t her trigeminal neuralgia.   She seemed a little confused while discussing things with her on the phone. She mentioned dentist recommended a crown, but could affect her trigeminal neuralgia. She brought up several times a procedure that a Dr. Maryruth Eve in [place], [place] can do every six months to help (she kept referencing a modified MRI?). Wanting to know if [name] would recommend this after she has work done by dentist? States she has spoken with her about this in the past and would like to speak directly with him about this to know how to proceed. I tried to get more info from pt, but she kindly asked to speak with doctor about this.  She does not want to change her medications.

## 2020-03-02 NOTE — Telephone Encounter (Signed)
Pt. states her tooth fell out & is wanting an emergent appt. for Mon. Please advise.

## 2020-03-07 MED ORDER — GABAPENTIN 300 MG PO CAPS: 300.0000 mg | ORAL_CAPSULE | Freq: Three times a day (TID) | ORAL | 0 refills | Status: DC

## 2020-03-07 NOTE — Telephone Encounter (Signed)
E-scribed rx 

## 2020-03-07 NOTE — Telephone Encounter (Signed)
Called pt to further discuss. She started gabapentin 300mg  po TID back up 2 weeks ago. Felt she was not having SE as what she reported before. Spoke with pharmacist who told her it does not cause halluncinations. She is now taking both gabapentin and lamotrigine po QID d/t increased mouth pain. Aware they are only supposed to be TID. She is wanting refill on gabapentin as previously prescribed. Advised I will send request to [name].  FYI- she has dental appt tomorrow to f/u about her tooth. She will see [name] on 03/27/20.

## 2020-03-07 NOTE — Telephone Encounter (Signed)
Pt called, would like to start a refill for Gabapentin. Would like a call from the nurse. Contact info: [phone].

## 2020-03-07 NOTE — Addendum Note (Signed)
Addended by: Wyvonnia Lora on: 03/07/2020 04:05 PM   Modules accepted: Orders

## 2020-03-07 NOTE — Addendum Note (Signed)
Addended by: Wyvonnia Lora on: 03/07/2020 06:15 PM   Modules accepted: Orders

## 2020-03-07 NOTE — Telephone Encounter (Signed)
Yes we can refill this.  

## 2020-03-27 ENCOUNTER — Encounter: Payer: Self-pay | Admitting: Neurology

## 2020-03-27 ENCOUNTER — Telehealth: Payer: Self-pay | Admitting: Neurology

## 2020-03-27 ENCOUNTER — Ambulatory Visit: Payer: Medicare Other | Admitting: Neurology

## 2020-03-27 VITALS — BP 178/83 | HR 62 | Ht 63.0 in | Wt 141.5 lb

## 2020-03-27 DIAGNOSIS — G35 Multiple sclerosis: Secondary | ICD-10-CM

## 2020-03-27 DIAGNOSIS — R42 Dizziness and giddiness: Secondary | ICD-10-CM

## 2020-03-27 DIAGNOSIS — R269 Unspecified abnormalities of gait and mobility: Secondary | ICD-10-CM

## 2020-03-27 DIAGNOSIS — G500 Trigeminal neuralgia: Secondary | ICD-10-CM

## 2020-03-27 NOTE — Progress Notes (Signed)
[facility]  PATIENT: Crystal Huber DOB: [birth year]  REFERRING DOCTOR OR PCP:  [place]: Patient  _________________________________   HISTORICAL  CHIEF COMPLAINT:  Chief Complaint  Patient presents with  . Increased cognitive issues    Rm 12 niece/POA -Tammy    HISTORY OF PRESENT ILLNESS:  Crystal Huber is a 75 y.o. woman with relapsing remitting MS diagnosed in 2002.  Update 03/27/2020 She feels her MS is mostly stable.    She is on Revif and tolerates it well.   She has had more vertigo.   Otherwise, she has been fairly stable.   Otherwise, She feels her MS is mostly stable.    No new exacerbation  Her gait is mildly off balanced, worse if she tilts backwards and better if slightly bent forward.   She uses a cane when outside her home and often inside.  She feels she could walk 100 feet without a rest.    She has less vertigo now.   She did vestibular rehab.  [place], she [place] her head, got dizzy and fell.    She denies weakness or numbness.  She is on Lamotrigine 150 mg po tid and gabapentin 300 mg po tid and tegretol Trigeminal neuralgia with benefit.     She notes notes some cognitive issues.  She has had sone STM issues.    She is not driving as she has more leg pain.  She sees a Restaurant manager, fast food at times.   Mood is doing ok with no depression though she has anxiety.   She sleeps well most nights.    Labs 03/03/2020 showed low Vit D (24).  Lymphocytes minimally low at 0.8.   CMP fine except increased Alk phos while other LFTs were fine.  TSH was fine.  B12 was normal.  Today, she scored 25/30 on the MMSE.      MMSE - Mini Mental State Exam 03/27/2020 03/27/2020  Orientation to time 3 3  Orientation to Place 5 5  Registration 3 3  Attention/ Calculation 2 2  Recall 3 3  Language- name 2 objects 2 2  Language- repeat 1 1  Language- follow 3 step command 3 3  Language- read & follow direction 1 1  Write  a sentence 1 1  Copy design 1 1  Total score 25 25  Clock numbers poor    MS History:   She had MRI and LP consistent with the diagnosis of MS in 2002 after presenting with trigeminal neuralgia.   She was started on Rebif.   She is on Rebif 22 mcg, tolerates it well and has had no definite exacerbation since.       MRI of the brain 09/19/2016 shows classic MS lesions.  There were a few foci not present in 2012.    REVIEW OF SYSTEMS: Constitutional: No fevers, chills, sweats, or change in appetite.   Fatigue, worse as the day goes on. Eyes: No visual changes, double vision, eye pain Ear, nose and throat: No hearing loss, ear pain, nasal congestion, sore throat Cardiovascular: No chest pain, palpitations Respiratory: No shortness of breath at rest or with exertion.   No wheezes GastrointestinaI: No nausea, vomiting, diarrhea, abdominal pain, fecal incontinence Genitourinary: No dysuria, urinary retention or frequency.  No nocturia. Musculoskeletal: No neck pain, back pain Integumentary: No rash, pruritus, skin lesions Neurological: as above Psychiatric: Some depression and anxiety. Endocrine: No palpitations, diaphoresis, change in appetite, change in weigh or increased  thirst Hematologic/Lymphatic: No anemia, purpura, petechiae..   Bruises easily Allergic/Immunologic: No itchy/runny eyes, nasal congestion, recent allergic reactions, rashes  ALLERGIES: Allergies  Allergen Reactions  . Carbamazepine     Decreased sodium level  . Imipramine     Night sweats, more intense dreams  . Metronidazole Nausea Only  . Oxcarbazepine     Decreased sodium level  . Penicillins Swelling    Yeast infection  . Statins     Gets hyponatremia and severe muscle pain  . Sulfa Antibiotics Swelling    yeast infection    HOME MEDICATIONS:  Current Outpatient Medications:  .  acetaminophen (TYLENOL) 325 MG tablet, Take 650 mg by mouth every 6 (six) hours as needed., Disp: , Rfl:  .  ALPRAZolam  (XANAX) 0.5 MG tablet, Take 0.25 mg by mouth at bedtime. , Disp: , Rfl:  .  baclofen (LIORESAL) 10 MG tablet, TAKE ONE TABLET BY MOUTH THREE TIMES DAILY, Disp: 90 tablet, Rfl: 5 .  calcium citrate-vitamin D (CITRACAL+D) 315-200 MG-UNIT tablet, Take 1 tablet by mouth 2 (two) times daily., Disp: , Rfl:  .  carbamazepine (TEGRETOL) 100 MG chewable tablet, Chew 100 mg by mouth 3 (three) times daily., Disp: , Rfl:  .  citalopram (CELEXA) 20 MG tablet, TAKE ONE TABLET BY MOUTH DAILY, Disp: , Rfl:  .  gabapentin (NEURONTIN) 300 MG capsule, Take 1 capsule (300 mg total) by mouth 3 (three) times daily., Disp: 90 capsule, Rfl: 0 .  hydrALAZINE (APRESOLINE) 50 MG tablet, Take 50 mg by mouth., Disp: , Rfl:  .  HYDROcodone-acetaminophen (NORCO/VICODIN) 5-325 MG tablet, Take 1 tablet every 6 (six) hours as needed by mouth for moderate pain., Disp: 30 tablet, Rfl: 0 .  lactobacillus acidophilus (BACID) TABS tablet, Take 1 tablet by mouth daily., Disp: , Rfl:  .  lamoTRIgine (LAMICTAL) 150 MG tablet, TAKE ONE TABLET BY MOUTH THREE TIMES DAILY , Disp: 270 tablet, Rfl: 3 .  latanoprost (XALATAN) 0.005 % ophthalmic solution, Place 1 drop into both eyes at bedtime., Disp: , Rfl:  .  losartan (COZAAR) 100 MG tablet, TAKE ONE TABLET BY MOUTH DAILY, Disp: , Rfl:  .  meclizine (ANTIVERT) 25 MG tablet, , Disp: , Rfl:  .  metoprolol succinate (TOPROL-XL) 50 MG 24 hr tablet, Take 50 mg by mouth., Disp: , Rfl:  .  olmesartan (BENICAR) 20 MG tablet, Take 20 mg by mouth., Disp: , Rfl:  .  Omega-3 Fatty Acids (FISH OIL PO), Take 1,400 mg by mouth daily., Disp: , Rfl:  .  omeprazole (PRILOSEC) 40 MG capsule, Take 40 mg by mouth., Disp: , Rfl:  .  REBIF 22 MCG/0.5ML SOSY, INJECT 22MCG INTO THE SKIN 3 TIMES A WEEK, Disp: 6 mL, Rfl: 11 .  timolol (TIMOPTIC) 0.5 % ophthalmic solution, In am, Disp: , Rfl:  .  vitamin B-12 (CYANOCOBALAMIN) 100 MCG tablet, Place under the tongue. Reported on 04/14/2015, Disp: , Rfl:  .  vitamin E 45 MG  (100 UNITS) capsule, Take by mouth daily., Disp: , Rfl:   Current Facility-Administered Medications:  .  0.9 %  sodium chloride infusion, 500 mL, Intravenous, Continuous, Nandigam, Venia Minks, MD  PAST MEDICAL HISTORY: Past Medical History:  Diagnosis Date  . Arthritis    right pinkie  . Cancer ([place])    right BR  CA   . Cataract    right eye  . GERD (gastroesophageal reflux disease)    prn tums, mild not often  . Glaucoma   . Hyperlipidemia   .  Hypertension   . Multiple sclerosis ([place])   . Neuromuscular disorder (HCC)    trigeminal neuralgia  . Refusal of blood product     PAST SURGICAL HISTORY: Past Surgical History:  Procedure Laterality Date  . BREAST LUMPECTOMY Right    wears prosthesis  . COLONOSCOPY  12/20/2010  . ganglion cyst removal Bilateral   . PARTIAL HYSTERECTOMY    . POLYPECTOMY      FAMILY HISTORY: Family History  Problem Relation Age of Onset  . Congestive Heart Failure Mother   . Heart attack Father   . Stroke Sister   . Breast cancer Sister   . Heart disease Brother   . Heart disease Sister   . Heart disease Brother   . Heart disease Brother   . Colon cancer Neg Hx   . Colon polyps Neg Hx     SOCIAL HISTORY:  Social History   Socioeconomic History  . Marital status: Married    Spouse name: Not on file  . Number of children: Not on file  . Years of education: Not on file  . Highest education level: Not on file  Occupational History  . Not on file  Tobacco Use  . Smoking status: Former Research scientist (life sciences)  . Smokeless tobacco: Never Used  Substance and Sexual Activity  . Alcohol use: Yes    Alcohol/week: 0.0 standard drinks    Comment: 1-2 glasses of wine at night/fim  . Drug use: No  . Sexual activity: Not on file  Other Topics Concern  . Not on file  Social History Narrative  . Not on file   Social Determinants of Health   Financial Resource Strain: Not on file  Food Insecurity: Not on file  Transportation Needs: Not on file   Physical Activity: Not on file  Stress: Not on file  Social Connections: Not on file  Intimate Partner Violence: Not on file     PHYSICAL EXAM  Vitals:   03/27/20 1328  BP: (!) 178/83  Pulse: 62  Weight: 141 lb 8 oz (64.2 kg)  Height: $Remove'5\' 3"'RxtAxbf$  (1.6 m)    Body mass index is 25.07 kg/m.    General: The patient is well-developed and well-nourished and in no acute distress  Neck:  The neck has good range of motion.  Neurologic Exam  Mental status: The patient is alert and oriented x 3 at the time of the examination. The patient has apparent normal recent and remote memory, with an apparently normal attention span and concentration ability.   Speech is normal.  Cranial nerves: Extraocular movements are full. No nystagmus.  Facial strength and sensation was normal.  She has reduced left-sided hearing.. The Weber does not lateralize.   Motor: Tremor not notable. Muscle bulk is normal.   Tone is normal.  Specifically, there is no cogwheeling.  Strength is  5 / 5 in all 4 extremities.   Sensory: She has normal symmetric sensation to touch and vibration.  Coordination: Cerebellar testing shows good finger nose finger and heel to shin.   Gait and station: Station is normal.  The gait is mildly wide.  She cannot tandem walk...   Romberg is negative...    Reflexes: Deep tendon reflexes are symmetric and normal bilaterally.        ASSESSMENT AND PLAN  Multiple sclerosis ([place]) - Plan: MR BRAIN WO CONTRAST  Fothergill's neuralgia  Trigeminal neuralgia - Plan: MR BRAIN WO CONTRAST  Gait disorder  Vertigo   1.    Continue  Rebif.  Recent labs were fine.  We will check an MRI of the brain to determine if there is any subclinical progression.  If present consider a different medication. 2.    Continue Lamotrigine 150 mg po tid, tegretol and gabapentin tid for Trigeminal neuralgia.   Can take prn Baclofen.    3.    Stay active and exercise as tolerated. 4.    Vitamin D was recently  low.  Advised to take 4000 or 5000 units daily.  Rtc 6 months, sooner if problems   Cicily Bonano A. Felecia Shelling, MD, PhD 3/72/9021, 1:15 PM Certified in Neurology, Clinical Neurophysiology, Sleep Medicine, Pain Medicine and Neuroimaging  [facility] [address] [place], [place] [address] [phone]

## 2020-03-27 NOTE — Telephone Encounter (Signed)
[employer] medicare order sent to [facility]. No auth they will reach out to the patient to schedule.  

## 2020-04-03 ENCOUNTER — Other Ambulatory Visit: Payer: Self-pay | Admitting: Neurology

## 2020-06-06 ENCOUNTER — Ambulatory Visit: Payer: Medicare Other | Admitting: Family Medicine

## 2020-06-12 ENCOUNTER — Telehealth: Payer: Self-pay | Admitting: Neurology

## 2020-06-12 NOTE — Telephone Encounter (Signed)
Called and spoke with pt. She is requesting referral to Dr. Delfino Lovett Rauck/[facility] for [place] (last seen over 1 yr ago). She is taking tegretol 100mg  up to 5 per day, gabapentin 300mg  po QID, lamotrigine 150mg  po QID. Taking baclofen 10mg  po qhs.  She is going to reach out to Dr. Maryruth Eve to see if she can make appt w/o referral. She will reach back out to our office if referral is needed.

## 2020-06-12 NOTE — Telephone Encounter (Signed)
Pt called, having trouble with my Trigeminal Neuralgia. Wanting to know if I need a referral from [name] to see a physician in [facility] [place]. Would like a call from the nurse.

## 2020-06-16 DIAGNOSIS — I340 Nonrheumatic mitral (valve) insufficiency: Secondary | ICD-10-CM | POA: Insufficient documentation

## 2020-06-16 DIAGNOSIS — I071 Rheumatic tricuspid insufficiency: Secondary | ICD-10-CM | POA: Insufficient documentation

## 2020-07-09 ENCOUNTER — Emergency Department (HOSPITAL_BASED_OUTPATIENT_CLINIC_OR_DEPARTMENT_OTHER)
Admission: EM | Admit: 2020-07-09 | Discharge: 2020-07-09 | Disposition: A | Discharge status: Home / Self Care | Payer: Medicare Other | Attending: Emergency Medicine | Admitting: Emergency Medicine

## 2020-07-09 ENCOUNTER — Other Ambulatory Visit: Payer: Self-pay

## 2020-07-09 ENCOUNTER — Encounter (HOSPITAL_BASED_OUTPATIENT_CLINIC_OR_DEPARTMENT_OTHER): Payer: Self-pay | Admitting: Emergency Medicine

## 2020-07-09 DIAGNOSIS — I10 Essential (primary) hypertension: Secondary | ICD-10-CM | POA: Insufficient documentation

## 2020-07-09 DIAGNOSIS — Z79899 Other long term (current) drug therapy: Secondary | ICD-10-CM | POA: Diagnosis not present

## 2020-07-09 DIAGNOSIS — G500 Trigeminal neuralgia: Secondary | ICD-10-CM | POA: Insufficient documentation

## 2020-07-09 DIAGNOSIS — Z87891 Personal history of nicotine dependence: Secondary | ICD-10-CM | POA: Diagnosis not present

## 2020-07-09 DIAGNOSIS — R519 Headache, unspecified: Secondary | ICD-10-CM | POA: Diagnosis present

## 2020-07-09 DIAGNOSIS — Z853 Personal history of malignant neoplasm of breast: Secondary | ICD-10-CM | POA: Diagnosis not present

## 2020-07-09 MED ORDER — HYDROCODONE-ACETAMINOPHEN 7.5-325 MG/15ML PO SOLN
10.0000 mL | Freq: Once | ORAL | Status: AC
Start: 2020-07-09 — End: 2020-07-09
  Administered 2020-07-09: 10 mL via ORAL
  Filled 2020-07-09: qty 15

## 2020-07-09 NOTE — ED Triage Notes (Signed)
Reports history of trigeminal neuralgia for 20 plus years.  Condition has been worse the last few days.  Seen at UC 3 weeks ago and was diagnosed with bells palsy.  Also reports swelling to the left side of face and difficulty swallowing.  Seeing pain management.  Had face numbed on Wednesday.  Unsure if related.

## 2020-07-09 NOTE — ED Notes (Signed)
Present with woresing left facial pain s/p injection for trigeminal neuralgia.   States pain makes it difficult to talk and swallow.

## 2020-07-09 NOTE — ED Provider Notes (Signed)
[facility] EMERGENCY DEPARTMENT Provider Note   CSN: [id number] Arrival date & time: 07/09/20  2036     History Chief Complaint  Patient presents with  . Trigeminal Neuralgia    Crystal Huber is a 75 y.o. female.  A 75 year old female with extensive past medical history below including breast cancer, MS, trigeminal neuralgia, hypertension, hyperlipidemia, GERD who presents with facial pain and swelling.  Patient reports a 20-year history of trigeminal neuralgia that has caused her significant pain that waxes and wanes.  She follows with a neurologist as well as a pain specialist.  Patient and her friend state that she intermittently has swelling on her left face that seems to come and go randomly.  4 days ago, she had a trigeminal nerve block by her pain specialist.  Several days prior to that, she was seen at urgent care for some left facial weakness and was diagnosed with Bell's palsy, started on valacyclovir and prednisone.  Her weakness has improved.  Patient states that today her trigeminal pain became more severe she had difficulty taking her usual medications today.  She was eventually able to take them around 5 PM today.  She has had some swelling today but friend states that the swelling was present before she had the injection and this is similar to the usual swelling that she sometimes has.  She has had no arm or leg weakness.  The history is provided by the patient and a friend.       Past Medical History:  Diagnosis Date  . Arthritis    right pinkie  . Cancer ( [place])    right BR  CA   . Cataract    right eye  . GERD (gastroesophageal reflux disease)    prn tums, mild not often  . Glaucoma   . Hyperlipidemia   . Hypertension   . Multiple sclerosis ([place])   . Neuromuscular disorder (H[facility])    trigeminal neuralgia  . Refusal of blood product     Patient Active Problem List   Diagnosis Date Noted  . Tremor 11/19/2017  . Vertigo 05/19/2017  . 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 Gait disorder 09/11/2015  . Urinary frequency 07/14/2014  . Trigeminal neuralgia 05/16/2014  . Ingrown nail 12/13/2013  . Onychomycosis 12/13/2013  . Pain in lower limb 12/13/2013  . Multiple sclerosis ([place]) 09/12/2013  . Fatigue 09/12/2013  . LBP (low back pain) 09/12/2013  . Cannot sleep 09/12/2013  . Cephalalgia 09/12/2013  . Anxiety disorder 09/12/2013  . Arthralgia of shoulder 09/12/2013  . Extremity pain 09/12/2013  . Fothergill's neuralgia 09/12/2013  . Jerking gait 09/12/2013  . Avitaminosis D 09/12/2013  . Hypercholesterolemia 09/12/2013  . 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 BREAST LUMPECTOMY Right    wears prosthesis  . COLONOSCOPY  12/20/2010  . ganglion cyst removal Bilateral   . PARTIAL HYSTERECTOMY    . POLYPECTOMY       OB History   No obstetric history on file.     Family History  Problem Relation Age of Onset  . Congestive Heart Failure Mother   . Heart attack Father   . Stroke Sister   . Breast cancer Sister   . Heart disease Brother   . Heart disease Sister   . Heart disease Brother   . Heart disease Brother   . Colon cancer Neg Hx   .  Colon polyps Neg Hx     Social History   Tobacco Use  . Smoking status: Former Research scientist (life sciences)  . Smokeless tobacco: Never Used  Substance Use Topics  . Alcohol use: Yes    Alcohol/week: 0.0 standard drinks    Comment: 1-2 glasses of wine at night/fim  . Drug use: No    Home Medications Prior to Admission medications   Medication Sig Start Date End Date Taking? Authorizing Provider  acetaminophen (TYLENOL) 325 MG tablet Take 650 mg by mouth every 6 (six) hours as needed.    [provider]  ALPRAZolam Duanne Moron) 0.5 MG tablet Take 0.25 mg by mouth at bedtime.  10/18/16   [provider]  baclofen (LIORESAL) 10 MG tablet TAKE ONE TABLET BY MOUTH  THREE TIMES DAILY 12/07/19   Sater, Nanine Means, MD  calcium citrate-vitamin D (CITRACAL+D) 315-200 MG-UNIT tablet Take 1 tablet by mouth 2 (two) times daily.    [provider]  carbamazepine (TEGRETOL) 100 MG chewable tablet Chew 100 mg by mouth 3 (three) times daily. 10/26/19   [provider]  citalopram (CELEXA) 20 MG tablet TAKE ONE TABLET BY MOUTH DAILY 01/14/17   [provider]  gabapentin (NEURONTIN) 300 MG capsule TAKE ONE CAPSULE BY MOUTH THREE TIMES DAILY 04/03/20   Sater, Nanine Means, MD  hydrALAZINE (APRESOLINE) 50 MG tablet Take 50 mg by mouth. 10/30/16   [provider]  HYDROcodone-acetaminophen (NORCO/VICODIN) 5-325 MG tablet Take 1 tablet every 6 (six) hours as needed by mouth for moderate pain. 01/29/17   Sater, Nanine Means, MD  lactobacillus acidophilus (BACID) TABS tablet Take 1 tablet by mouth daily.    [provider]  lamoTRIgine (LAMICTAL) 150 MG tablet TAKE ONE TABLET BY MOUTH THREE TIMES DAILY 04/03/20   Sater, Nanine Means, MD  latanoprost (XALATAN) 0.005 % ophthalmic solution Place 1 drop into both eyes at bedtime.    [provider]  losartan (COZAAR) 100 MG tablet TAKE ONE TABLET BY MOUTH DAILY 10/07/16   [provider]  meclizine (ANTIVERT) 25 MG tablet  08/04/15   [provider]  metoprolol succinate (TOPROL-XL) 50 MG 24 hr tablet Take 50 mg by mouth. 10/21/16   [provider]  olmesartan (BENICAR) 20 MG tablet Take 20 mg by mouth. 09/16/13   [provider]  Omega-3 Fatty Acids (FISH OIL PO) Take 1,400 mg by mouth daily.    [provider]  omeprazole (PRILOSEC) 40 MG capsule Take 40 mg by mouth. 04/15/16 03/27/20  [provider]  REBIF 22 MCG/0.5ML SOSY INJECT 22MCG INTO THE SKIN 3 TIMES A WEEK 08/24/19   Sater, Nanine Means, MD  timolol (TIMOPTIC) 0.5 % ophthalmic solution In am 03/21/20   [provider]  vitamin B-12 (CYANOCOBALAMIN) 100 MCG tablet Place under the  tongue. Reported on 04/14/2015    [provider]  vitamin E 45 MG (100 UNITS) capsule Take by mouth daily.    [provider]    Allergies    Carbamazepine, Imipramine, Metronidazole, Oxcarbazepine, Penicillins, Statins, and Sulfa antibiotics  Review of Systems   Review of Systems All other systems reviewed and are negative except that which was mentioned in HPI  Physical Exam Updated Vital Signs BP (!) 173/78 (BP Location: Left Arm)   Pulse (!) 58   Temp 98.1 F (36.7 C) (Oral)   Resp 18   Ht 5\' 3"  (1.6 m)   Wt 64.2 kg   SpO2 98%   BMI 25.07 kg/m  Physical Exam Vitals and nursing note reviewed.  Constitutional:      General: She is not in acute distress.    Appearance: She is well-developed.  HENT:     Head: Normocephalic and atraumatic.     Comments: Mild fullness of L lower cheek compared to R with no erythema, warmth, induration, or skin changes    Mouth/Throat:     Mouth: Mucous membranes are moist.     Pharynx: Oropharynx is clear.  Eyes:     Conjunctiva/sclera: Conjunctivae normal.  Musculoskeletal:     Cervical back: Neck supple.  Skin:    General: Skin is warm and dry.  Neurological:     Mental Status: She is alert and oriented to person, place, and time.     Cranial Nerves: No cranial nerve deficit.     Motor: No weakness.     Comments: Fluent speech although intermittently winces while talking, no obvious facial asymmetry  Psychiatric:        Mood and Affect: Mood normal.     ED Results / Procedures / Treatments   Labs (all labs ordered are listed, but only abnormal results are displayed) Labs Reviewed - No data to display  EKG None  Radiology No results found.  Procedures Procedures   Medications Ordered in ED Medications  HYDROcodone-acetaminophen (HYCET) 7.5-325 mg/15 ml solution 10 mL (has no administration in time range)    ED Course  I have reviewed the triage vital signs and the nursing notes.     MDM  Rules/Calculators/A&P                          Alert and well-appearing on exam.  I had a long discussion regarding the patient's timeline of symptoms and it sounds like the swelling has been on and off present for many years.  The swelling definitely did not begin after the injection although the injection may have exacerbated the the underlying issue.  I do not feel she has any signs or symptoms of infection today.  Her main complaint is her acute on chronic pain from trigeminal neuralgia.  I explained the limitations of pain control in the ED, offered a dose of hydrocodone here but encouraged her to follow-up with her pain specialist as well as her neurologist.  It looks like she has been referred for MRI as an outpatient and she will call in the morning to get that scheduled.  Her friend states she will assist in arranging follow-up. Final Clinical Impression(s) / ED Diagnoses Final diagnoses:  Trigeminal neuralgia of left side of face    Rx / DC Orders ED Discharge Orders    None       Mayuri Staples, Wenda Overland, MD 07/09/20 2133

## 2020-07-17 LAB — BASIC METABOLIC PANEL
BUN: 21 (ref 4–21)
CO2: 25 — AB (ref 13–22)
Chloride: 100 (ref 99–108)
Creatinine: 0.8 (ref 0.5–1.1)
Glucose: 114
Potassium: 3.8 (ref 3.4–5.3)
Sodium: 132 — AB (ref 137–147)

## 2020-07-17 LAB — CBC AND DIFFERENTIAL
HCT: 37 (ref 36–46)
Hemoglobin: 12.3 (ref 12.0–16.0)
Neutrophils Absolute: 8.9
Platelets: 208 (ref 150–399)
WBC: 10.7

## 2020-07-17 LAB — COMPREHENSIVE METABOLIC PANEL
Albumin: 3.7 (ref 3.5–5.0)
Calcium: 8.6 — AB (ref 8.7–10.7)

## 2020-07-17 LAB — HEPATIC FUNCTION PANEL
ALT: 14 (ref 7–35)
AST: 22 (ref 13–35)
Alkaline Phosphatase: 76 (ref 25–125)
Bilirubin, Total: 0.5

## 2020-07-17 LAB — CBC: RBC: 3.79 — AB (ref 3.87–5.11)

## 2020-09-07 ENCOUNTER — Ambulatory Visit: Payer: Medicare Other | Attending: Internal Medicine

## 2020-09-07 DIAGNOSIS — Z23 Encounter for immunization: Secondary | ICD-10-CM

## 2020-09-07 NOTE — Progress Notes (Signed)
   Covid-19 Vaccination Clinic  Name:  Crystal Huber    MRN: [id number] DOB: 10/26/1945  09/07/2020  [name] was observed post Covid-19 immunization for 15 minutes without incident. She was provided with Vaccine Information Sheet and instruction to access the V-Safe system.   [name] was instructed to call 911 with any severe reactions post vaccine: Difficulty breathing  Swelling of face and throat  A fast heartbeat  A bad rash all over body  Dizziness and weakness   Immunizations Administered     Name Date Dose VIS Date Route   PFIZER Comrnaty(Gray TOP) Covid-19 Vaccine 09/07/2020  1:18 PM 0.3 mL 02/24/2020 Intramuscular   Manufacturer: [employer]   Lot: [id number]   [place]: [phone]

## 2020-09-08 ENCOUNTER — Other Ambulatory Visit (HOSPITAL_BASED_OUTPATIENT_CLINIC_OR_DEPARTMENT_OTHER): Payer: Self-pay

## 2020-09-08 MED ORDER — COVID-19 MRNA VAC-TRIS(PFIZER) 30 MCG/0.3ML IM SUSP
INTRAMUSCULAR | 0 refills | Status: DC
  Filled 2020-09-08: qty 0.3, 1d supply, fill #0

## 2020-09-14 ENCOUNTER — Other Ambulatory Visit: Payer: Self-pay | Admitting: Neurology

## 2020-09-25 ENCOUNTER — Encounter: Payer: Self-pay | Admitting: Nurse Practitioner

## 2020-09-25 ENCOUNTER — Other Ambulatory Visit: Payer: Self-pay

## 2020-09-25 ENCOUNTER — Ambulatory Visit (INDEPENDENT_AMBULATORY_CARE_PROVIDER_SITE_OTHER): Payer: Medicare Other | Admitting: Nurse Practitioner

## 2020-09-25 VITALS — BP 120/80 | HR 54 | Temp 97.3°F | Ht 63.0 in | Wt 134.4 lb

## 2020-09-25 DIAGNOSIS — E7800 Pure hypercholesterolemia, unspecified: Secondary | ICD-10-CM | POA: Diagnosis not present

## 2020-09-25 DIAGNOSIS — R42 Dizziness and giddiness: Secondary | ICD-10-CM

## 2020-09-25 DIAGNOSIS — G500 Trigeminal neuralgia: Secondary | ICD-10-CM

## 2020-09-25 DIAGNOSIS — G35 Multiple sclerosis: Secondary | ICD-10-CM

## 2020-09-25 DIAGNOSIS — I10 Essential (primary) hypertension: Secondary | ICD-10-CM | POA: Diagnosis not present

## 2020-09-25 DIAGNOSIS — F413 Other mixed anxiety disorders: Secondary | ICD-10-CM | POA: Diagnosis not present

## 2020-09-25 DIAGNOSIS — K219 Gastro-esophageal reflux disease without esophagitis: Secondary | ICD-10-CM

## 2020-09-25 NOTE — Patient Instructions (Signed)
To schedule AWV telephone visit ~2 weeks.

## 2020-09-25 NOTE — Progress Notes (Signed)
*   Careteam: Patient Care Team: [name], NP as PCP - General (Geriatric Medicine)  PLACE OF SERVICE:  [facility]  Advanced Directive information    Allergies  Allergen Reactions   Carbamazepine     Decreased sodium level   Imipramine     Night sweats, more intense dreams   Metronidazole Nausea Only   Oxcarbazepine     Decreased sodium level   Penicillins Swelling    Yeast infection   Statins     Gets hyponatremia and severe muscle pain   Sulfa Antibiotics Swelling    yeast infection    Chief Complaint  Patient presents with   Establish Care    New patient to establish care.     HPI: Patient is a 75 y.o. female to establish care.  Felt like she needed more of a Geriatric appropriate.   Anxiety- controlled on celexa 20 mg daily   Trigeminal neuralgia- followed by Dr Felecia Shelling. Taking gabapentin and lamotrigine TID.   Hypertension- taking hydralazine 50 mg by mouth twice daily with losartan 100 mg daily with metoprolol 50 mg daily   GERD- will use lactobacillus acidophillus or tums- will rarely use.   MS-  taking rebif three times a week.   Dizziness/vertigo- uses meclizine PRN. Completed vestibular rehab.   Hx of breast cancer- followed by surgery only. Has mammogram yearly. This is up to date  Osteoarthritis- mild.   Hyperlipidemia- could not tolerate statin.  Review of Systems:  Review of Systems  Constitutional:  Negative for chills, fever and weight loss.  HENT:  Positive for hearing loss. Negative for tinnitus.   Respiratory:  Positive for shortness of breath and wheezing. Negative for cough and sputum production.   Cardiovascular:  Negative for chest pain, palpitations and leg swelling.  Gastrointestinal:  Negative for abdominal pain, constipation, diarrhea and heartburn.  Genitourinary:  Negative for dysuria, frequency and urgency.  Musculoskeletal:  Negative for back pain, falls, joint pain and myalgias.  Skin: Negative.   Neurological:   Positive for dizziness, tingling, sensory change and weakness. Negative for headaches.  Psychiatric/Behavioral:  Negative for depression and memory loss. The patient is nervous/anxious. The patient does not have insomnia.    Past Medical History:  Diagnosis Date   Arthritis    right pinkie   Cancer ([place])    right BR  CA    Cataract    right eye   GERD (gastroesophageal reflux disease)    prn tums, mild not often   Glaucoma    Hyperlipidemia    Hypertension    Multiple sclerosis ([place])    Neuromuscular disorder ([place])    trigeminal neuralgia   Refusal of blood product    Past Surgical History:  Procedure Laterality Date   BREAST LUMPECTOMY Right    wears prosthesis   COLONOSCOPY  12/20/2010   ganglion cyst removal Bilateral    PARTIAL HYSTERECTOMY     POLYPECTOMY     Social History:   reports that she has quit smoking. She has never used smokeless tobacco. She reports current alcohol use of about 14.0 standard drinks of alcohol per week. She reports that she does not use drugs.  Family History  Problem Relation Age of Onset   Congestive Heart Failure Mother    Heart disease Father    Heart attack Father    Stroke Sister    Breast cancer Sister    Heart disease Sister    Heart disease Brother    Heart  disease Brother    Heart disease Brother    Colon cancer Neg Hx    Colon polyps Neg Hx     Medications: Patient's Medications  New Prescriptions   No medications on file  Previous Medications   CITALOPRAM (CELEXA) 20 MG TABLET    daily. 1 and a half tablet   GABAPENTIN (NEURONTIN) 300 MG CAPSULE    TAKE ONE CAPSULE BY MOUTH THREE TIMES DAILY   HYDRALAZINE (APRESOLINE) 50 MG TABLET    Take 50 mg by mouth 2 (two) times daily.   LACTOBACILLUS ACIDOPHILUS (BACID) TABS TABLET    Take 1 tablet by mouth daily.   LAMOTRIGINE (LAMICTAL) 150 MG TABLET    TAKE ONE TABLET BY MOUTH THREE TIMES DAILY   LATANOPROST (XALATAN) 0.005 % OPHTHALMIC SOLUTION    Place 1 drop into both  eyes at bedtime.   LOSARTAN (COZAAR) 100 MG TABLET    TAKE ONE TABLET BY MOUTH DAILY   MECLIZINE (ANTIVERT) 25 MG TABLET    1/2 tablet daily   METOPROLOL SUCCINATE (TOPROL-XL) 50 MG 24 HR TABLET    Take 50 mg by mouth daily.   REBIF 22 MCG/0.5ML SOSY    INJECT 22 MCG INTO THE SKIN THREE TIMES A WEEK   TIMOLOL (TIMOPTIC) 0.5 % OPHTHALMIC SOLUTION    In am   UNABLE TO FIND    Nature Made Multi vitamin  Modified Medications   No medications on file  Discontinued Medications   ACETAMINOPHEN (TYLENOL) 325 MG TABLET    Take 650 mg by mouth every 6 (six) hours as needed.   ALPRAZOLAM (XANAX) 0.5 MG TABLET    Take 0.25 mg by mouth at bedtime.    BACLOFEN (LIORESAL) 10 MG TABLET    TAKE ONE TABLET BY MOUTH THREE TIMES DAILY   BUPROPION (WELLBUTRIN XL) 150 MG 24 HR TABLET    Take 150 mg by mouth daily.   CALCIUM CITRATE-VITAMIN D (CITRACAL+D) 315-200 MG-UNIT TABLET    Take 1 tablet by mouth 2 (two) times daily.   CARBAMAZEPINE (TEGRETOL) 100 MG CHEWABLE TABLET    Chew 200 mg by mouth 3 (three) times daily.   COVID-19 MRNA VAC-TRIS, PFIZER, SUSP INJECTION    Inject into the muscle.   HYDROCODONE-ACETAMINOPHEN (NORCO/VICODIN) 5-325 MG TABLET    Take 1 tablet every 6 (six) hours as needed by mouth for moderate pain.   MULTIPLE VITAMINS-MINERALS (ONE-A-DAY WOMENS PO)    Take by mouth.   OLMESARTAN (BENICAR) 20 MG TABLET    Take 20 mg by mouth.   OMEGA-3 FATTY ACIDS (FISH OIL PO)    Take 1,400 mg by mouth daily.   OMEPRAZOLE (PRILOSEC) 40 MG CAPSULE    Take 40 mg by mouth.   VITAMIN B-12 (CYANOCOBALAMIN) 100 MCG TABLET    Place under the tongue. Reported on 04/14/2015   VITAMIN E 45 MG (100 UNITS) CAPSULE    Take by mouth daily.    Physical Exam:  Vitals:   09/25/20 1350  BP: 120/80  Pulse: (!) 54  Temp: (!) 97.3 F (36.3 C)  TempSrc: Temporal  SpO2: 96%  Weight: 134 lb 6.4 oz (61 kg)  Height: 5\' 3"  (1.6 m)   Body mass index is 23.81 kg/m. Wt Readings from Last 3 Encounters:  09/25/20 134  lb 6.4 oz (61 kg)  07/09/20 141 lb 8.6 oz (64.2 kg)  03/27/20 141 lb 8 oz (64.2 kg)    Physical Exam Constitutional:      General: She is  not in acute distress.    Appearance: She is well-developed. She is not diaphoretic.  HENT:     Head: Normocephalic and atraumatic.     Mouth/Throat:     Pharynx: No oropharyngeal exudate.  Eyes:     Conjunctiva/sclera: Conjunctivae normal.     Pupils: Pupils are equal, round, and reactive to light.  Cardiovascular:     Rate and Rhythm: Normal rate and regular rhythm.     Heart sounds: Normal heart sounds.  Pulmonary:     Effort: Pulmonary effort is normal.     Breath sounds: Normal breath sounds.  Abdominal:     General: Bowel sounds are normal.     Palpations: Abdomen is soft.  Musculoskeletal:     Cervical back: Normal range of motion and neck supple.     Right lower leg: No edema.     Left lower leg: No edema.  Skin:    General: Skin is warm and dry.  Neurological:     Mental Status: She is alert.  Psychiatric:        Mood and Affect: Mood normal.   Labs reviewed: Basic Metabolic Panel: No results for input(s): NA, K, CL, CO2, GLUCOSE, BUN, CREATININE, CALCIUM, MG, PHOS, TSH in the last 8760 hours. Liver Function Tests: No results for input(s): AST, ALT, ALKPHOS, BILITOT, PROT, ALBUMIN in the last 8760 hours. No results for input(s): LIPASE, AMYLASE in the last 8760 hours. No results for input(s): AMMONIA in the last 8760 hours. CBC: No results for input(s): WBC, NEUTROABS, HGB, HCT, MCV, PLT in the last 8760 hours. Lipid Panel: No results for input(s): CHOL, HDL, LDLCALC, TRIG, CHOLHDL, LDLDIRECT in the last 8760 hours. TSH: No results for input(s): TSH in the last 8760 hours. A1C: No results found for: HGBA1C   Assessment/Plan 1. Trigeminal neuralgia Followed by pain management and neurology, has tired multiple medication regimen with side effects or not effective. She is currently on lamictal and gabapentin for  pain.  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  3. Hypercholesterolemia -stable off medication. HDL elevated based on recent labs. Continue dietary modifications.  4. Primary hypertension Well controlled on losartan, toprol and hydralazine.   5. Multiple sclerosis ([place]) -followed by neurology, continues on rebif injection.   6. Vertigo Has dizziness due to MS and will use meclizine PRN  7. Gastroesophageal reflux disease without esophagitis Stable at this time, will use OTC pRN   Next appt: 3 months.  Carlos American. [place], [facility] [phone]

## 2020-09-26 DIAGNOSIS — I10 Essential (primary) hypertension: Secondary | ICD-10-CM | POA: Insufficient documentation

## 2020-09-26 DIAGNOSIS — K219 Gastro-esophageal reflux disease without esophagitis: Secondary | ICD-10-CM | POA: Insufficient documentation

## 2020-09-27 ENCOUNTER — Ambulatory Visit: Payer: Medicare Other | Admitting: Neurology

## 2020-10-05 ENCOUNTER — Other Ambulatory Visit: Payer: Self-pay | Admitting: *Deleted

## 2020-10-05 MED ORDER — HYDRALAZINE HCL 50 MG PO TABS: 50.0000 mg | ORAL_TABLET | Freq: Two times a day (BID) | ORAL | 1 refills | Status: DC

## 2020-10-05 NOTE — Telephone Encounter (Signed)
Patient requested refill

## 2020-12-06 ENCOUNTER — Telehealth: Payer: Self-pay | Admitting: *Deleted

## 2020-12-06 NOTE — Telephone Encounter (Signed)
LVM for pt to call office back today about appt tomorrow.  LVM for niece to call to get appt tomorrow morning rescheduled. [name] having to come into the office late tomorrow morning. Offered to get her r/s to 12/11/20 at 9am instead.

## 2020-12-07 ENCOUNTER — Other Ambulatory Visit: Payer: Self-pay | Admitting: *Deleted

## 2020-12-07 ENCOUNTER — Ambulatory Visit: Payer: Medicare Other | Admitting: Neurology

## 2020-12-07 MED ORDER — LOSARTAN POTASSIUM 100 MG PO TABS: 100.0000 mg | ORAL_TABLET | Freq: Every day | ORAL | 1 refills | Status: DC

## 2020-12-07 MED ORDER — METOPROLOL SUCCINATE ER 50 MG PO TB24: 50.0000 mg | ORAL_TABLET | Freq: Every day | ORAL | 1 refills | Status: DC

## 2020-12-07 NOTE — Telephone Encounter (Signed)
Patient requested refills.

## 2020-12-11 ENCOUNTER — Encounter: Payer: Self-pay | Admitting: Neurology

## 2020-12-11 ENCOUNTER — Ambulatory Visit: Payer: Medicare Other | Admitting: Neurology

## 2020-12-12 ENCOUNTER — Encounter: Payer: Self-pay | Admitting: Nurse Practitioner

## 2020-12-12 ENCOUNTER — Other Ambulatory Visit: Payer: Self-pay

## 2020-12-12 ENCOUNTER — Telehealth: Payer: Self-pay

## 2020-12-12 ENCOUNTER — Encounter: Payer: Medicare Other | Admitting: Nurse Practitioner

## 2020-12-12 ENCOUNTER — Ambulatory Visit (INDEPENDENT_AMBULATORY_CARE_PROVIDER_SITE_OTHER): Payer: Medicare Other | Admitting: Nurse Practitioner

## 2020-12-12 DIAGNOSIS — E2839 Other primary ovarian failure: Secondary | ICD-10-CM

## 2020-12-12 DIAGNOSIS — Z0000 Encounter for general adult medical examination without abnormal findings: Secondary | ICD-10-CM | POA: Diagnosis not present

## 2020-12-12 NOTE — Telephone Encounter (Addendum)
5 unsuccessful attempts made to reach patient by phone to completed annual wellness visit. A message was left on the first 2 attempts informing patient about the reason for visit and to make her aware that we were calling from a remote location that may show up on her caller ID as [place] or [place].   1st 8:43 am  2nd 9:00 am  3rd 9:09 am, spoke with patients niece Lynelle Smoke, who stated she will call patient and tell her to answer her phone.  4th 9:10 am  5th 9:11 am   A message was sent to the [facility] administrative staff informing them if patient calls the office she needs to reschedule appointment

## 2020-12-12 NOTE — Progress Notes (Signed)
   This service is provided via telemedicine  No vital signs collected/recorded due to the encounter was a telemedicine visit.   Location of patient (ex: home, work):  Home  Patient consents to a telephone visit: Yes, see telephone visit dated 12/12/20  Location of the provider (ex: office, home):  [place], Remote Location  Name of any referring provider:  N/A  Names of all persons participating in the telemedicine service and their role in the encounter:  S.Chrae B/CMA, Sherrie Mustache, NP, and Patient   Time spent on call:  7 min with medical assistant

## 2020-12-12 NOTE — Progress Notes (Signed)
   This service is provided via telemedicine  No vital signs collected/recorded due to the encounter was a telemedicine visit.   Location of patient (ex: home, work):  Home  Patient consents to a telephone visit: Yes, see telephone visit dated 12/12/20  Location of the provider (ex: office, home):  [place], Remote Location   Name of any referring provider:  N/A  Names of all persons participating in the telemedicine service and their role in the encounter:  S.Chrae B/CMA, Sherrie Mustache, NP, and Patient   Time spent on call:  9 min with medical assistant

## 2020-12-12 NOTE — Telephone Encounter (Signed)
Ms. weda, baumgarner are scheduled for a virtual visit with your provider today.    Just as we do with appointments in the office, we must obtain your consent to participate.  Your consent will be active for this visit and any virtual visit you may have with one of our providers in the next 365 days.    If you have a MyChart account, I can also send a copy of this consent to you electronically.  All virtual visits are billed to your insurance company just like a traditional visit in the office.  As this is a virtual visit, video technology does not allow for your provider to perform a traditional examination.  This may limit your provider's ability to fully assess your condition.  If your provider identifies any concerns that need to be evaluated in person or the need to arrange testing such as labs, EKG, etc, we will make arrangements to do so.    Although advances in technology are sophisticated, we cannot ensure that it will always work on either your end or our end.  If the connection with a video visit is poor, we may have to switch to a telephone visit.  With either a video or telephone visit, we are not always able to ensure that we have a secure connection.   I need to obtain your verbal consent now.   Are you willing to proceed with your visit today?   NATALINE BASARA has provided verbal consent on 12/12/2020 for a virtual visit (video or telephone).   Leigh Aurora [place], [place] 12/12/2020  10:02 AM

## 2020-12-12 NOTE — Patient Instructions (Signed)
Crystal Huber , Thank you for taking time to come for your Medicare Wellness Visit. I appreciate your ongoing commitment to your health goals. Please review the following plan we discussed and let me know if I can assist you in the future.   Screening recommendations/referrals: Colonoscopy DUE For follow up- please schedule.  Mammogram up to date Bone Density ordered  Recommended yearly ophthalmology/optometry visit for glaucoma screening and checkup Recommended yearly dental visit for hygiene and checkup  Vaccinations: Influenza vaccine RECOMMENDED to get at this time- can get in office or at local pharmacy Pneumococcal vaccine up to date Tdap vaccine RECOMMENDED to get at this time at local pharmacy Shingles vaccine RECOMMENDED to get at this time at [facility] booster- to get at your local pharmacy.   Advanced directives: on file.   Conditions/risks identified: advanced age, hypertension, fall risk.   Next appointment: yearly for AWV   Preventive Care 75 Years and Older, Female Preventive care refers to lifestyle choices and visits with your health care provider that can promote health and wellness. What does preventive care include? A yearly physical exam. This is also called an annual well check. Dental exams once or twice a year. Routine eye exams. Ask your health care provider how often you should have your eyes checked. Personal lifestyle choices, including: Daily care of your teeth and gums. Regular physical activity. Eating a healthy diet. Avoiding tobacco and drug use. Limiting alcohol use. Practicing safe sex. Taking low-dose aspirin every day. Taking vitamin and mineral supplements as recommended by your health care provider. What happens during an annual well check? The services and screenings done by your health care provider during your annual well check will depend on your age, overall health, lifestyle risk factors, and family history of  disease. Counseling  Your health care provider may ask you questions about your: Alcohol use. Tobacco use. Drug use. Emotional well-being. Home and relationship well-being. Sexual activity. Eating habits. History of falls. Memory and ability to understand (cognition). Work and work Statistician. Reproductive health. Screening  You may have the following tests or measurements: Height, weight, and BMI. Blood pressure. Lipid and cholesterol levels. These may be checked every 5 years, or more frequently if you are over 4 years old. Skin check. Lung cancer screening. You may have this screening every year starting at age 84 if you have a 30-pack-year history of smoking and currently smoke or have quit within the past 15 years. Fecal occult blood test (FOBT) of the stool. You may have this test every year starting at age 64. Flexible sigmoidoscopy or colonoscopy. You may have a sigmoidoscopy every 5 years or a colonoscopy every 10 years starting at age 48. Hepatitis C blood test. Hepatitis B blood test. Sexually transmitted disease (STD) testing. Diabetes screening. This is done by checking your blood sugar (glucose) after you have not eaten for a while (fasting). You may have this done every 1-3 years. Bone density scan. This is done to screen for osteoporosis. You may have this done starting at age 5. Mammogram. This may be done every 1-2 years. Talk to your health care provider about how often you should have regular mammograms. Talk with your health care provider about your test results, treatment options, and if necessary, the need for more tests. Vaccines  Your health care provider may recommend certain vaccines, such as: Influenza vaccine. This is recommended every year. Tetanus, diphtheria, and acellular pertussis (Tdap, Td) vaccine. You may need a Td booster  every 10 years. Zoster vaccine. You may need this after age 32. Pneumococcal 13-valent conjugate (PCV13) vaccine. One  dose is recommended after age 23. Pneumococcal polysaccharide (PPSV23) vaccine. One dose is recommended after age 28. Talk to your health care provider about which screenings and vaccines you need and how often you need them. This information is not intended to replace advice given to you by your health care provider. Make sure you discuss any questions you have with your health care provider. Document Released: 03/31/2015 Document Revised: 11/22/2015 Document Reviewed: 01/03/2015 Elsevier Interactive Patient Education  2017 [place] Prevention in the Home Falls can cause injuries. They can happen to people of all ages. There are many things you can do to make your home safe and to help prevent falls. What can I do on the outside of my home? Regularly fix the edges of walkways and driveways and fix any cracks. Remove anything that might make you trip as you walk through a door, such as a raised step or threshold. Trim any bushes or trees on the path to your home. Use bright outdoor lighting. Clear any walking paths of anything that might make someone trip, such as rocks or tools. Regularly check to see if handrails are loose or broken. Make sure that both sides of any steps have handrails. Any raised decks and porches should have guardrails on the edges. Have any leaves, snow, or ice cleared regularly. Use sand or salt on walking paths during winter. Clean up any spills in your garage right away. This includes oil or grease spills. What can I do in the bathroom? Use night lights. Install grab bars by the toilet and in the tub and shower. Do not use towel bars as grab bars. Use non-skid mats or decals in the tub or shower. If you need to sit down in the shower, use a plastic, non-slip stool. Keep the floor dry. Clean up any water that spills on the floor as soon as it happens. Remove soap buildup in the tub or shower regularly. Attach bath mats securely with double-sided  non-slip rug tape. Do not have throw rugs and other things on the floor that can make you trip. What can I do in the bedroom? Use night lights. Make sure that you have a light by your bed that is easy to reach. Do not use any sheets or blankets that are too big for your bed. They should not hang down onto the floor. Have a firm chair that has side arms. You can use this for support while you get dressed. Do not have throw rugs and other things on the floor that can make you trip. What can I do in the kitchen? Clean up any spills right away. Avoid walking on wet floors. Keep items that you use a lot in easy-to-reach places. If you need to reach something above you, use a strong step stool that has a grab bar. Keep electrical cords out of the way. Do not use floor polish or wax that makes floors slippery. If you must use wax, use non-skid floor wax. Do not have throw rugs and other things on the floor that can make you trip. What can I do with my stairs? Do not leave any items on the stairs. Make sure that there are handrails on both sides of the stairs and use them. Fix handrails that are broken or loose. Make sure that handrails are as long as the stairways. Check any carpeting to  make sure that it is firmly attached to the stairs. Fix any carpet that is loose or worn. Avoid having throw rugs at the top or bottom of the stairs. If you do have throw rugs, attach them to the floor with carpet tape. Make sure that you have a light switch at the top of the stairs and the bottom of the stairs. If you do not have them, ask someone to add them for you. What else can I do to help prevent falls? Wear shoes that: Do not have high heels. Have rubber bottoms. Are comfortable and fit you well. Are closed at the toe. Do not wear sandals. If you use a stepladder: Make sure that it is fully opened. Do not climb a closed stepladder. Make sure that both sides of the stepladder are locked into place. Ask  someone to hold it for you, if possible. Clearly mark and make sure that you can see: Any grab bars or handrails. First and last steps. Where the edge of each step is. Use tools that help you move around (mobility aids) if they are needed. These include: Canes. Walkers. Scooters. Crutches. Turn on the lights when you go into a dark area. Replace any light bulbs as soon as they burn out. Set up your furniture so you have a clear path. Avoid moving your furniture around. If any of your floors are uneven, fix them. If there are any pets around you, be aware of where they are. Review your medicines with your doctor. Some medicines can make you feel dizzy. This can increase your chance of falling. Ask your doctor what other things that you can do to help prevent falls. This information is not intended to replace advice given to you by your health care provider. Make sure you discuss any questions you have with your health care provider. Document Released: 12/29/2008 Document Revised: 08/10/2015 Document Reviewed: 04/08/2014 Elsevier Interactive Patient Education  2017 [employer].

## 2020-12-12 NOTE — Progress Notes (Signed)
Subjective:   Crystal Huber is a 75 y.o. female who presents for Medicare Annual (Subsequent) preventive examination.  Review of Systems         Objective:    Today's Vitals   12/12/20 1046  PainSc: 5    There is no height or weight on file to calculate BMI.  Advanced Directives 12/12/2020 07/09/2020 09/21/2016 04/19/2016 02/22/2016  Does Patient Have a Medical Advance Directive? Yes Yes No Yes Yes  Type of Paramedic of [place];Living will [place];Living will - - Living will;Healthcare Power of Attorney  Does patient want to make changes to medical advance directive? No - Patient declined - - - -  Copy of [place] in Chart? Yes - validated most recent copy scanned in chart (See row information) - - - -  Would patient like information on creating a medical advance directive? - - No - Patient declined - -    Current Medications (verified) Outpatient Encounter Medications as of 12/12/2020  Medication Sig   citalopram (CELEXA) 20 MG tablet 1 and a half tablet   gabapentin (NEURONTIN) 300 MG capsule TAKE ONE CAPSULE BY MOUTH THREE TIMES DAILY   hydrALAZINE (APRESOLINE) 50 MG tablet Take 1 tablet (50 mg total) by mouth 2 (two) times daily.   lamoTRIgine (LAMICTAL) 150 MG tablet TAKE ONE TABLET BY MOUTH THREE TIMES DAILY   latanoprost (XALATAN) 0.005 % ophthalmic solution Place 1 drop into both eyes at bedtime.   losartan (COZAAR) 100 MG tablet Take 1 tablet (100 mg total) by mouth daily.   meclizine (ANTIVERT) 25 MG tablet 1/2 tablet daily   metoprolol succinate (TOPROL-XL) 50 MG 24 hr tablet Take 1 tablet (50 mg total) by mouth daily.   REBIF 22 MCG/0.5ML SOSY INJECT 22 MCG INTO THE SKIN THREE TIMES A WEEK   timolol (TIMOPTIC) 0.5 % ophthalmic solution Place 1 drop into both eyes daily. am   UNABLE TO FIND Nature Made Multi vitamin   lactobacillus acidophilus (BACID) TABS tablet Take 1 tablet by mouth daily. (Patient not  taking: Reported on 12/12/2020)   Facility-Administered Encounter Medications as of 12/12/2020  Medication   0.9 %  sodium chloride infusion    Allergies (verified) Carbamazepine, Imipramine, Metronidazole, Oxcarbazepine, Penicillins, Statins, and Sulfa antibiotics   History: Past Medical History:  Diagnosis Date   Arthritis    right pinkie   Cancer ([place])    right BR  CA    Cataract    right eye   GERD (gastroesophageal reflux disease)    prn tums, mild not often   Glaucoma    Hyperlipidemia    Hypertension    Multiple sclerosis ([place])    Neuromuscular disorder ([place])    trigeminal neuralgia   Refusal of blood product    Past Surgical History:  Procedure Laterality Date   BREAST LUMPECTOMY Right    wears prosthesis   COLONOSCOPY  12/20/2010   ganglion cyst removal Bilateral    PARTIAL HYSTERECTOMY     POLYPECTOMY     Family History  Problem Relation Age of Onset   Congestive Heart Failure Mother    Heart disease Father    Heart attack Father    Stroke Sister    Breast cancer Sister    Heart disease Sister    Heart disease Brother    Heart disease Brother    Heart disease Brother    Colon cancer Neg Hx    Colon polyps Neg Hx  Social History   Socioeconomic History   Marital status: Widowed    Spouse name: Not on file   Number of children: Not on file   Years of education: Not on file   Highest education level: Not on file  Occupational History   Not on file  Tobacco Use   Smoking status: Former    Years: 1.50    Types: Cigarettes   Smokeless tobacco: Never   Tobacco comments:    Quit smoking at age 35 and only smoke occasional x 1.5 years   Vaping Use   Vaping Use: Never used  Substance and Sexual Activity   Alcohol use: Yes    Alcohol/week: 14.0 standard drinks    Types: 14 Standard drinks or equivalent per week    Comment: 1-2 glasses of wine at night/fim   Drug use: No   Sexual activity: Not on file  Other Topics Concern   Not on file   Social History Narrative   Diet      Do you drink/eat things with caffeine      Marital Status     What year were you married?      Do you live in a house, apartment, assisted living, condo, trailer, etc.?      Is it one or more stories?      How many persons live in your home?         Do you have any pets in your home?(please list)      Highest level of education completed:      Current or past profession:      Do you exercise?:    Type and how often:      Do you have a Living Will? (Form that indicates scenarios where you would not want your life prolonged)      Do you have a DNR form?         If not, would you like to discuss one?      Do you have signed POA/HPOA forms? Yes      Do you have difficulty bathing or dressing yourself?      Do you have difficulty preparing food or eating?      Do you have difficulty managing medications?      Do you have difficulty managing your finances?      Do you have difficulty affording your medications?                     Social Determinants of Health   Financial Resource Strain: Not on file  Food Insecurity: Not on file  Transportation Needs: Not on file  Physical Activity: Not on file  Stress: Not on file  Social Connections: Not on file    Tobacco Counseling Counseling given: Not Answered Tobacco comments: Quit smoking at age 70 and only smoke occasional x 1.5 years    Clinical Intake:  Pre-visit preparation completed: Yes  Pain : 0-10 Pain Score: 5  Pain Type: Neuropathic pain Pain Location: Face Pain Orientation: Left Pain Descriptors / Indicators: Sharp Pain Onset: More than a month ago Pain Frequency: Constant     BMI - recorded: 23 Nutritional Status: BMI of 19-24  Normal Diabetes: No  How often do you need to have someone help you when you read instructions, pamphlets, or other written materials from your doctor or pharmacy?: 3 - Sometimes  Diabetic?no         Activities of Daily  Living In your  present state of health, do you have any difficulty performing the following activities: 12/12/2020  Hearing? Y  Vision? N  Difficulty concentrating or making decisions? Y  Walking or climbing stairs? Y  Comment trouble with stairs  Dressing or bathing? N  Doing errands, shopping? N  Preparing Food and eating ? N  Using the Toilet? N  In the past six months, have you accidently leaked urine? N  Do you have problems with loss of bowel control? N  Managing your Medications? N  Managing your Finances? N  Housekeeping or managing your Housekeeping? N  Some recent data might be hidden    Patient Care Team: [name], NP as PCP - General (Geriatric Medicine)  Indicate any recent Medical Services you may have received from other than [facility] providers in the past year (date may be approximate).     Assessment:   This is a routine wellness examination for Craig.  Hearing/Vision screen Hearing Screening - Comments:: Hearing loss, will seen ENT (plans to schedule soon) Vision Screening - Comments:: Last eye exam less than 12 months ago (evaluated every 3 months). Dr. Heloise Ochoa  Dietary issues and exercise activities discussed: Current Exercise Habits: Home exercise routine, Type of exercise: calisthenics;walking, Time (Minutes): 15, Frequency (Times/Week): 7, Weekly Exercise (Minutes/Week): 105   Goals Addressed   None    Depression Screen PHQ 2/9 Scores 12/12/2020  PHQ - 2 Score 0    Fall Risk Fall Risk  12/12/2020  Falls in the past year? 1  Number falls in past yr: 0  Injury with Fall? 1  Comment Strained knee  Risk for fall due to : History of fall(s)  Follow up Falls evaluation completed    [place]:  Any stairs in or around the home? No  If so, are there any without handrails? No  Home free of loose throw rugs in walkways, pet beds, electrical cords, etc? Yes  Adequate lighting in your home to reduce risk of falls?  Yes   ASSISTIVE DEVICES UTILIZED TO PREVENT FALLS:  Life alert? No  Use of a cane, walker or w/c? Yes  Grab bars in the bathroom? Yes  Shower chair or bench in shower? Yes  Elevated toilet seat or a handicapped toilet? Yes   TIMED UP AND GO:  Was the test performed? No .    Cognitive Function: MMSE - Mini Mental State Exam 03/27/2020 03/27/2020  Orientation to time 3 3  Orientation to Place 5 5  Registration 3 3  Attention/ Calculation 2 2  Recall 3 3  Language- name 2 objects 2 2  Language- repeat 1 1  Language- follow 3 step command 3 3  Language- read & follow direction 1 1  Write a sentence 1 1  Copy design 1 1  Total score 25 25     6CIT Screen 12/12/2020  What Year? 0 points  What month? 0 points  What time? 0 points  Count back from 20 0 points  Months in reverse 0 points  Repeat phrase 0 points  Total Score 0    Immunizations Immunization History  Administered Date(s) Administered   Influenza Split 02/25/2013, 12/20/2013, 01/16/2016   Influenza, High Dose Seasonal PF 01/22/2017, 12/18/2018, 01/26/2020   Influenza,inj,Quad PF,6+ Mos 01/16/2016   Influenza,inj,quad, With Preservative 12/20/2013   Influenza-Unspecified 01/10/2010, 02/04/2011, 03/17/2012   PFIZER Comirnaty(Gray Top)Covid-19 Tri-Sucrose Vaccine 09/07/2020   PFIZER(Purple Top)SARS-COV-2 Vaccination 06/24/2019, 07/15/2019, 12/28/2019   Pneumococcal Conjugate-13 11/11/2016  Pneumococcal Polysaccharide-23 11/11/2011   Tdap 10/16/2010    TDAP status: Due, Education has been provided regarding the importance of this vaccine. Advised may receive this vaccine at local pharmacy or Health Dept. Aware to provide a copy of the vaccination record if obtained from local pharmacy or Health Dept. Verbalized acceptance and understanding.  Flu Vaccine status: Due, Education has been provided regarding the importance of this vaccine. Advised may receive this vaccine at local pharmacy or Health Dept. Aware to  provide a copy of the vaccination record if obtained from local pharmacy or Health Dept. Verbalized acceptance and understanding.  Pneumococcal vaccine status: Up to date  Covid-19 vaccine status: Information provided on how to obtain vaccines.   Qualifies for Shingles Vaccine? Yes   Zostavax completed No   Shingrix Completed?: No.    Education has been provided regarding the importance of this vaccine. Patient has been advised to call insurance company to determine out of pocket expense if they have not yet received this vaccine. Advised may also receive vaccine at local pharmacy or Health Dept. Verbalized acceptance and understanding.  Screening Tests Health Maintenance  Topic Date Due   Hepatitis C Screening  Never done   Zoster Vaccines- Shingrix (1 of 2) Never done   DEXA SCAN  Never done   COLONOSCOPY (Pts 45-27yrs Insurance coverage will need to be confirmed)  05/01/2019   TETANUS/TDAP  10/15/2020   INFLUENZA VACCINE  10/16/2020   COVID-19 Vaccine (5 - Booster for Pfizer series) 01/07/2021   HPV VACCINES  Aged Out    Health Maintenance  Health Maintenance Due  Topic Date Due   Hepatitis C Screening  Never done   Zoster Vaccines- Shingrix (1 of 2) Never done   DEXA SCAN  Never done   COLONOSCOPY (Pts 45-67yrs Insurance coverage will need to be confirmed)  05/01/2019   TETANUS/TDAP  10/15/2020   INFLUENZA VACCINE  10/16/2020    Colorectal cancer screening: Type of screening: Colonoscopy. Completed 2018. Repeat every 3 years  Mammogram status: Completed 05/25/20. Repeat every year  Bone Density status: Ordered today. Pt provided with contact info and advised to call to schedule appt.  Lung Cancer Screening: (Low Dose CT Chest recommended if Age 45-80 years, 30 pack-year currently smoking OR have quit w/in 15years.) does not qualify.   Lung Cancer Screening Referral: na  Additional Screening:  Hepatitis C Screening: does qualify; Complete with next blood  work  Vision Screening: Recommended annual ophthalmology exams for early detection of glaucoma and other disorders of the eye. Is the patient up to date with their annual eye exam?  Yes  Who is the provider or what is the name of the office in which the patient attends annual eye exams? Sub If pt is not established with a provider, would they like to be referred to a provider to establish care? No .   Dental Screening: Recommended annual dental exams for proper oral hygiene  Community Resource Referral / Chronic Care Management: CRR required this visit?  No   CCM required this visit?  No      Plan:     I have personally reviewed and noted the following in the patient's chart:   Medical and social history Use of alcohol, tobacco or illicit drugs  Current medications and supplements including opioid prescriptions.  Functional ability and status Nutritional status Physical activity Advanced directives List of other physicians Hospitalizations, surgeries, and ER visits in previous 12 months Vitals Screenings to include cognitive, depression, and  falls Referrals and appointments  In addition, I have reviewed and discussed with patient certain preventive protocols, quality metrics, and best practice recommendations. A written personalized care plan for preventive services as well as general preventive health recommendations were provided to patient.     [name], NP   12/12/2020    Virtual Visit via Telephone Note  I connected withNAME@ on 12/12/20 at 10:30 AM EDT by telephone and verified that I am speaking with the correct person using two identifiers.  Location: Patient: home Provider: [facility].    I discussed the limitations, risks, security and privacy concerns of performing an evaluation and management service by telephone and the availability of in person appointments. I also discussed with the patient that there may be a patient responsible charge related  to this service. The patient expressed understanding and agreed to proceed.   I discussed the assessment and treatment plan with the patient. The patient was provided an opportunity to ask questions and all were answered. The patient agreed with the plan and demonstrated an understanding of the instructions.   The patient was advised to call back or seek an in-person evaluation if the symptoms worsen or if the condition fails to improve as anticipated.  I provided 18 minutes of non-face-to-face time during this encounter.  Carlos American. Harle Battiest Avs printed and mailed

## 2020-12-13 ENCOUNTER — Ambulatory Visit: Payer: Medicare Other | Admitting: Neurology

## 2020-12-13 ENCOUNTER — Encounter: Payer: Self-pay | Admitting: Neurology

## 2020-12-13 ENCOUNTER — Other Ambulatory Visit: Payer: Self-pay

## 2020-12-13 ENCOUNTER — Telehealth: Payer: Self-pay | Admitting: Neurology

## 2020-12-13 VITALS — BP 122/58 | HR 58 | Ht 63.0 in | Wt 137.0 lb

## 2020-12-13 DIAGNOSIS — R269 Unspecified abnormalities of gait and mobility: Secondary | ICD-10-CM

## 2020-12-13 DIAGNOSIS — G35 Multiple sclerosis: Secondary | ICD-10-CM | POA: Diagnosis not present

## 2020-12-13 DIAGNOSIS — G500 Trigeminal neuralgia: Secondary | ICD-10-CM

## 2020-12-13 DIAGNOSIS — R42 Dizziness and giddiness: Secondary | ICD-10-CM

## 2020-12-13 NOTE — Telephone Encounter (Signed)
[employer] Medicare, order faxed to [facility] per the patient they will reacho ut to the patient to schedule

## 2020-12-13 NOTE — Progress Notes (Signed)
[facility]  PATIENT: Crystal Huber DOB: 12/14/1945  REFERRING DOCTOR OR PCP:  Vedia Coffer SOURCE: Patient  _________________________________   HISTORICAL  CHIEF COMPLAINT:  Chief Complaint  Patient presents with   Follow-up    Pt rm 2, last ov 03/27/20, MS follow up. She is on Rebif. She has not been taking the baclofen at night because makes you too sleepy.c/o of discomfort with her neuralgia. The lamictal is ordered TID and sometimes she feels she needs a 4th pill. Not everyday.     HISTORY OF PRESENT ILLNESS:  Crystal Huber is a 75 y.o. woman with relapsing remitting MS diagnosed in 2002.  Update 12/13/2020 She feels her MS is mostly stable with gait actually doing better..    She is on Revif and tolerates it well.   She has had more vertigo.   Otherwise, she has been fairly stable.   Otherwise, She feels her MS is mostly stable.    No new exacerbation  Her gait is off balanced, She uses a cane sometimes when outside her home  but not inside now.   She could walk 1/4 mile or more.  Much better than    Vertigo resolved with meclizine 1/2 pill qAM   She denies weakness or numbness.  Facial pain is still a problem at times.   She is on Lamotrigine 150 mg po tid and gabapentin 300 mg po tid and tegretol Trigeminal neuralgia with benefit.   If pain is worse, she goes up o 4 lamotrigines (150 mg tid).  [facility] [place]Pain was better afterwards.  She stopped baclofen at night as she is sleepy/groggy in the morning.    She notes notes some cognitive issues bu tthese are stable.    She has had some STM issues.  Usually she does better with hints.   She is not driving. .   Mood is doing well.  No depression. Some anxiety.   She feels better now that sh ehas a roommate.  She sleeps well most nights.    Labs 03/03/2020 showed low Vit D (24).    TSH was fine.  B12 was normal.   LFT and Lymphocytes were fine    `  MMSE - Mini Mental State Exam 03/27/2020 03/27/2020  Orientation to time 3 3  Orientation to Place 5 5  Registration 3 3  Attention/ Calculation 2 2  Recall 3 3  Language- name 2 objects 2 2  Language- repeat 1 1  Language- follow 3 step command 3 3  Language- read & follow direction 1 1  Write a sentence 1 1  Copy design 1 1  Total score 25 25  Clock numbers poor    MS History:   She had MRI and LP consistent with the diagnosis of MS in 2002 after presenting with trigeminal neuralgia.   She was started on Rebif.   She is on Rebif 22 mcg, tolerates it well and has had no definite exacerbation since.       MRI of the brain 09/19/2016 shows classic MS lesions.  There were a few foci not present in 2012.    REVIEW OF SYSTEMS: Constitutional: No fevers, chills, sweats, or change in appetite.   Fatigue, worse as the day goes on. Eyes: No visual changes, double vision, eye pain Ear, nose and throat: No hearing loss, ear pain, nasal congestion, sore throat Cardiovascular: No chest pain, palpitations Respiratory:  No shortness of  breath at rest or with exertion.   No wheezes GastrointestinaI: No nausea, vomiting, diarrhea, abdominal pain, fecal incontinence Genitourinary:  No dysuria, urinary retention or frequency.  No nocturia. Musculoskeletal:  No neck pain, back pain Integumentary: No rash, pruritus, skin lesions Neurological: as above Psychiatric: Some depression and anxiety. Endocrine: No palpitations, diaphoresis, change in appetite, change in weigh or increased thirst Hematologic/Lymphatic:  No anemia, purpura, petechiae..   Bruises easily Allergic/Immunologic: No itchy/runny eyes, nasal congestion, recent allergic reactions, rashes  ALLERGIES: Allergies  Allergen Reactions   Carbamazepine     Decreased sodium level   Imipramine     Night sweats, more intense dreams   Metronidazole Nausea Only   Oxcarbazepine     Decreased sodium level   Penicillins Swelling     Yeast infection   Statins     Gets hyponatremia and severe muscle pain   Sulfa Antibiotics Swelling    yeast infection    HOME MEDICATIONS:  Current Outpatient Medications:    citalopram (CELEXA) 20 MG tablet, 1 and a half tablet, Disp: , Rfl:    gabapentin (NEURONTIN) 300 MG capsule, TAKE ONE CAPSULE BY MOUTH THREE TIMES DAILY, Disp: 270 capsule, Rfl: 3   hydrALAZINE (APRESOLINE) 50 MG tablet, Take 1 tablet (50 mg total) by mouth 2 (two) times daily., Disp: 180 tablet, Rfl: 1   lactobacillus acidophilus (BACID) TABS tablet, Take 1 tablet by mouth daily., Disp: , Rfl:    lamoTRIgine (LAMICTAL) 150 MG tablet, TAKE ONE TABLET BY MOUTH THREE TIMES DAILY, Disp: 270 tablet, Rfl: 3   latanoprost (XALATAN) 0.005 % ophthalmic solution, Place 1 drop into both eyes at bedtime., Disp: , Rfl:    losartan (COZAAR) 100 MG tablet, Take 1 tablet (100 mg total) by mouth daily., Disp: 90 tablet, Rfl: 1   meclizine (ANTIVERT) 25 MG tablet, 1/2 tablet daily, Disp: , Rfl:    metoprolol succinate (TOPROL-XL) 50 MG 24 hr tablet, Take 1 tablet (50 mg total) by mouth daily., Disp: 90 tablet, Rfl: 1   REBIF 22 MCG/0.5ML SOSY, INJECT 22 MCG INTO THE SKIN THREE TIMES A WEEK, Disp: 6 mL, Rfl: 11   timolol (TIMOPTIC) 0.5 % ophthalmic solution, Place 1 drop into both eyes daily. am, Disp: , Rfl:    UNABLE TO FIND, Nature Made Multi vitamin, Disp: , Rfl:   Current Facility-Administered Medications:    0.9 %  sodium chloride infusion, 500 mL, Intravenous, Continuous, Nandigam, Kavitha V, MD  PAST MEDICAL HISTORY: Past Medical History:  Diagnosis Date   Arthritis    right pinkie   Cancer ([place])    right BR  CA    Cataract    right eye   GERD (gastroesophageal reflux disease)    prn tums, mild not often   Glaucoma    Hyperlipidemia    Hypertension    Multiple sclerosis ([place])    Neuromuscular disorder ([place])    trigeminal neuralgia   Refusal of blood product     PAST SURGICAL HISTORY: Past Surgical History:   Procedure Laterality Date   BREAST LUMPECTOMY Right    wears prosthesis   COLONOSCOPY  12/20/2010   ganglion cyst removal Bilateral    PARTIAL HYSTERECTOMY     POLYPECTOMY      FAMILY HISTORY: Family History  Problem Relation Age of Onset   Congestive Heart Failure Mother    Heart disease Father    Heart attack Father    Stroke Sister    Breast cancer Sister  Heart disease Sister    Heart disease Brother    Heart disease Brother    Heart disease Brother    Colon cancer Neg Hx    Colon polyps Neg Hx     SOCIAL HISTORY:  Social History   Socioeconomic History   Marital status: Widowed    Spouse name: Not on file   Number of children: Not on file   Years of education: Not on file   Highest education level: Not on file  Occupational History   Not on file  Tobacco Use   Smoking status: Former    Years: 1.50    Types: Cigarettes   Smokeless tobacco: Never   Tobacco comments:    Quit smoking at age 12 and only smoke occasional x 1.5 years   Vaping Use   Vaping Use: Never used  Substance and Sexual Activity   Alcohol use: Yes    Alcohol/week: 14.0 standard drinks    Types: 14 Standard drinks or equivalent per week    Comment: 1-2 glasses of wine at night/fim   Drug use: No   Sexual activity: Not on file  Other Topics Concern   Not on file  Social History Narrative   Diet      Do you drink/eat things with caffeine      Marital Status     What year were you married?      Do you live in a house, apartment, assisted living, condo, trailer, etc.?      Is it one or more stories?      How many persons live in your home?         Do you have any pets in your home?(please list)      Highest level of education completed:      Current or past profession:      Do you exercise?:    Type and how often:      Do you have a Living Will? (Form that indicates scenarios where you would not want your life prolonged)      Do you have a DNR form?         If not,  would you like to discuss one?      Do you have signed POA/HPOA forms? Yes      Do you have difficulty bathing or dressing yourself?      Do you have difficulty preparing food or eating?      Do you have difficulty managing medications?      Do you have difficulty managing your finances?      Do you have difficulty affording your medications?                     Social Determinants of Health   Financial Resource Strain: Not on file  Food Insecurity: Not on file  Transportation Needs: Not on file  Physical Activity: Not on file  Stress: Not on file  Social Connections: Not on file  Intimate Partner Violence: Not on file     PHYSICAL EXAM  Vitals:   12/13/20 0839  BP: (!) 122/58  Pulse: (!) 58  Weight: 137 lb (62.1 kg)  Height: 5\' 3"  (1.6 m)    Body mass index is 24.27 kg/m.    General: The patient is well-developed and well-nourished and in no acute distress  Neck:  The neck has good range of motion.  Neurologic Exam  Mental status: The patient is alert and oriented x  3 at the time of the examination. The patient has apparent normal recent and remote memory, with an apparently normal attention span and concentration ability.   Speech is normal.  Cranial nerves: Extraocular movements are full. No nystagmus.  Facial strength and sensation was normal.  She has reduced left-sided hearing.. The Weber does not lateralize.   Motor: Tremor not notable. Muscle bulk is normal.    normal muscle tone in the arms strength is  5 / 5 in all 4 extremities.   Sensory: She has normal symmetric sensation to touch and vibration.  Coordination: Cerebellar testing shows good finger nose finger and heel to shin.   Gait and station: Station is normal.  The gait is mildly wide.  Tandem gait is poor...   Romberg is negative...    Reflexes: Deep tendon reflexes are symmetric and normal bilaterally.        ASSESSMENT AND PLAN  Multiple sclerosis ([place]) - Plan: MR BRAIN WO  CONTRAST  Trigeminal neuralgia  Gait disorder  Vertigo   1.    Continue Rebif.  Recent labs were fine.  We will check an MRI of the brain to determine if there is any subclinical progression.  If present consider a different medication. 2.    Continue Lamotrigine 150 mg po tid, tegretol and gabapentin tid for Trigeminal neuralgia.   Can take prn Baclofen.    3.    Cognition seems to be stable.  He did score 25/30 on the MMSE earlier this year consistent with mild cognitive impairment.  It is more likely that her MS is mildly affecting cognition rather than a second degenerative process such as AD. 4.    Continue to be active.  She is actually walking better this visit than her last visit.  Continue vitamin D supplements  Rtc 6 months, sooner if problems   Larence Thone A. Felecia Shelling, MD, PhD 0/21/1155, 2:08 PM Certified in Neurology, Clinical Neurophysiology, Sleep Medicine, Pain Medicine and Neuroimaging  [facility] [address], [place], [place] [address] [phone]

## 2020-12-13 NOTE — Telephone Encounter (Signed)
Scheduled for 10/6 10am

## 2020-12-18 ENCOUNTER — Encounter: Payer: Self-pay | Admitting: Nurse Practitioner

## 2020-12-18 ENCOUNTER — Other Ambulatory Visit: Payer: Self-pay

## 2020-12-18 ENCOUNTER — Ambulatory Visit (INDEPENDENT_AMBULATORY_CARE_PROVIDER_SITE_OTHER): Payer: Medicare Other | Admitting: Nurse Practitioner

## 2020-12-18 VITALS — BP 118/82 | HR 58 | Temp 97.5°F | Ht 63.0 in | Wt 137.2 lb

## 2020-12-18 DIAGNOSIS — I10 Essential (primary) hypertension: Secondary | ICD-10-CM | POA: Diagnosis not present

## 2020-12-18 DIAGNOSIS — Z23 Encounter for immunization: Secondary | ICD-10-CM

## 2020-12-18 DIAGNOSIS — F413 Other mixed anxiety disorders: Secondary | ICD-10-CM

## 2020-12-18 DIAGNOSIS — Z1159 Encounter for screening for other viral diseases: Secondary | ICD-10-CM

## 2020-12-18 DIAGNOSIS — G500 Trigeminal neuralgia: Secondary | ICD-10-CM

## 2020-12-18 DIAGNOSIS — G35 Multiple sclerosis: Secondary | ICD-10-CM | POA: Diagnosis not present

## 2020-12-18 DIAGNOSIS — G3184 Mild cognitive impairment, so stated: Secondary | ICD-10-CM

## 2020-12-18 DIAGNOSIS — E7800 Pure hypercholesterolemia, unspecified: Secondary | ICD-10-CM | POA: Diagnosis not present

## 2020-12-18 NOTE — Patient Instructions (Addendum)
Please obtain TDAP, COVID and shingles vaccine at your local pharmacy (these are covered under your pharmacy benefits)   To call [phone] to schedule bone density.

## 2020-12-18 NOTE — Progress Notes (Signed)
Careteam: Patient Care Team: [name], NP as PCP - General (Geriatric Medicine)  PLACE OF SERVICE:  [facility]  Advanced Directive information    Allergies  Allergen Reactions   Carbamazepine     Decreased sodium level   Imipramine     Night sweats, more intense dreams   Metronidazole Nausea Only   Oxcarbazepine     Decreased sodium level   Penicillins Swelling    Yeast infection   Statins     Gets hyponatremia and severe muscle pain   Sulfa Antibiotics Swelling    yeast infection    Chief Complaint  Patient presents with   Medical Management of Chronic Issues    3 month follow-up. Care gaps reviewed, discuss need for shingrix, td/tdap, Hep c screening, colonoscopy, dexa, and flu vaccine or exclude if patient refuses.       HPI: Patient is a 75 y.o. female   Does not remember where she got her previous colonoscopy done. She has had hx of polps in the past and recommended recall in 3 years. She will call a friend who knows where she went.   Htn- well controlled on current medication  Trigeminal neuralgia-  Pain is controlled today. She is taking baclofen 1/2 tablets at bedtime   Continues with hearing loss- followed by ENT. Not hearing the TV as well. Left side worse than right.   MS- followed by neurology, plans to have open MRI to evaluate for any additional lesions.  Continues on rebif injection   Anxiety- controlled on celexa   Review of Systems:  Review of Systems  Constitutional:  Negative for chills, fever and weight loss.  HENT:  Negative for tinnitus.   Respiratory:  Negative for cough, sputum production and shortness of breath.   Cardiovascular:  Negative for chest pain, palpitations and leg swelling.  Gastrointestinal:  Negative for abdominal pain, constipation, diarrhea and heartburn.  Genitourinary:  Negative for dysuria, frequency and urgency.  Musculoskeletal:  Negative for back pain, falls, joint pain and myalgias.  Skin:  Negative.   Neurological:  Negative for dizziness and headaches.  Psychiatric/Behavioral:  Negative for depression and memory loss. The patient does not have insomnia.    Past Medical History:  Diagnosis Date   Arthritis    right pinkie   Cancer ([place])    right BR  CA    Cataract    right eye   GERD (gastroesophageal reflux disease)    prn tums, mild not often   Glaucoma    Hyperlipidemia    Hypertension    Multiple sclerosis ([place])    Neuromuscular disorder ([place])    trigeminal neuralgia   Refusal of blood product    Past Surgical History:  Procedure Laterality Date   BREAST LUMPECTOMY Right    wears prosthesis   COLONOSCOPY  12/20/2010   ganglion cyst removal Bilateral    PARTIAL HYSTERECTOMY     POLYPECTOMY     Social History:   reports that she has quit smoking. Her smoking use included cigarettes. She has never used smokeless tobacco. She reports current alcohol use of about 14.0 standard drinks per week. She reports that she does not use drugs.  Family History  Problem Relation Age of Onset   Congestive Heart Failure Mother    Heart disease Father    Heart attack Father    Stroke Sister    Breast cancer Sister    Heart disease Sister    Heart disease Brother  Heart disease Brother    Heart disease Brother    Colon cancer Neg Hx    Colon polyps Neg Hx     Medications: Patient's Medications  New Prescriptions   No medications on file  Previous Medications   CITALOPRAM (CELEXA) 20 MG TABLET    1 and a half tablet   GABAPENTIN (NEURONTIN) 300 MG CAPSULE    TAKE ONE CAPSULE BY MOUTH THREE TIMES DAILY   HYDRALAZINE (APRESOLINE) 50 MG TABLET    Take 1 tablet (50 mg total) by mouth 2 (two) times daily.   LACTOBACILLUS ACIDOPHILUS (BACID) TABS TABLET    Take 1 tablet by mouth daily.   LAMOTRIGINE (LAMICTAL) 150 MG TABLET    TAKE ONE TABLET BY MOUTH THREE TIMES DAILY   LATANOPROST (XALATAN) 0.005 % OPHTHALMIC SOLUTION    Place 1 drop into both eyes at bedtime.    LOSARTAN (COZAAR) 100 MG TABLET    Take 1 tablet (100 mg total) by mouth daily.   MECLIZINE (ANTIVERT) 25 MG TABLET    1/2 tablet daily   METOPROLOL SUCCINATE (TOPROL-XL) 50 MG 24 HR TABLET    Take 1 tablet (50 mg total) by mouth daily.   REBIF 22 MCG/0.5ML SOSY    INJECT 22 MCG INTO THE SKIN THREE TIMES A WEEK   TIMOLOL (TIMOPTIC) 0.5 % OPHTHALMIC SOLUTION    Place 1 drop into both eyes daily. am   UNABLE TO FIND    Nature Made Multi vitamin  Modified Medications   No medications on file  Discontinued Medications   No medications on file    Physical Exam:  Vitals:   12/18/20 1302  BP: 118/82  Pulse: (!) 58  Temp: (!) 97.5 F (36.4 C)  TempSrc: Skin  SpO2: 97%  Weight: 137 lb 3.2 oz (62.2 kg)  Height: 5' 3"  (1.6 m)   Body mass index is 24.3 kg/m. Wt Readings from Last 3 Encounters:  12/18/20 137 lb 3.2 oz (62.2 kg)  12/13/20 137 lb (62.1 kg)  09/25/20 134 lb 6.4 oz (61 kg)    Physical Exam Constitutional:      General: She is not in acute distress.    Appearance: She is well-developed. She is not diaphoretic.  HENT:     Head: Normocephalic and atraumatic.     Mouth/Throat:     Pharynx: No oropharyngeal exudate.  Eyes:     Conjunctiva/sclera: Conjunctivae normal.     Pupils: Pupils are equal, round, and reactive to light.  Cardiovascular:     Rate and Rhythm: Normal rate and regular rhythm.     Heart sounds: Normal heart sounds.  Pulmonary:     Effort: Pulmonary effort is normal.     Breath sounds: Normal breath sounds.  Abdominal:     General: Bowel sounds are normal.     Palpations: Abdomen is soft.  Musculoskeletal:     Cervical back: Normal range of motion and neck supple.     Right lower leg: No edema.     Left lower leg: No edema.  Skin:    General: Skin is warm and dry.  Neurological:     Mental Status: She is alert.  Psychiatric:        Mood and Affect: Mood normal.    Labs reviewed: Basic Metabolic Panel: Recent Labs    07/17/20 0000  NA  132*  K 3.8  CL 100  CO2 25*  BUN 21  CREATININE 0.8  CALCIUM 8.6*   Liver Function  Tests: Recent Labs    07/17/20 0000  AST 22  ALT 14  ALKPHOS 76  ALBUMIN 3.7   No results for input(s): LIPASE, AMYLASE in the last 8760 hours. No results for input(s): AMMONIA in the last 8760 hours. CBC: Recent Labs    07/17/20 0000  WBC 10.7  NEUTROABS 8.90  HGB 12.3  HCT 37  PLT 208   Lipid Panel: Recent Labs    01/26/20 0000  HDL 104*  LDLCALC 105  TRIG 62   TSH: No results for input(s): TSH in the last 8760 hours. A1C: No results found for: HGBA1C   Assessment/Plan 1. Need for hepatitis C screening test - Hepatitis C antibody  2. Multiple sclerosis ([place]) -plan for MRI to follow up lesion through neurology continues on rebif  3. Primary hypertension Blood pressure well controlled Continue current medications Recheck metabolic panel - CMP with eGFR([facility]) - CBC with Differential/Platelet  4. Hypercholesterolemia -diet controlled.  - CMP with eGFR([facility]) - Lipid Panel  5. Need for influenza vaccination - Flu Vaccine QUAD High Dose([facility])  6. Trigeminal neuralgia Stable but Ongoing pain, continues on lamictal, gabapentin   7. Other mixed anxiety disorders -controlled on celexa.   8. MCI (mild cognitive impairment) -noted memory impairment, MMSE has been stable. likely related to MS.      Next appt: 6 months  Reedy Biernat K. [place], [facility] [phone]

## 2020-12-19 LAB — HEPATITIS C ANTIBODY
Hepatitis C Ab: NONREACTIVE
SIGNAL TO CUT-OFF: 0.01 (ref <1.00)

## 2020-12-19 LAB — COMPLETE METABOLIC PANEL WITH GFR
AG Ratio: 1.9 (calc) (ref 1.0–2.5)
ALT: 15 U/L (ref 6–29)
AST: 18 U/L (ref 10–35)
Albumin: 4.3 g/dL (ref 3.6–5.1)
Alkaline phosphatase (APISO): 88 U/L (ref 37–153)
BUN: 20 mg/dL (ref 7–25)
CO2: 29 mmol/L (ref 20–32)
Calcium: 9.5 mg/dL (ref 8.6–10.4)
Chloride: 102 mmol/L (ref 98–110)
Creat: 0.68 mg/dL (ref 0.60–1.00)
Globulin: 2.3 g/dL (calc) (ref 1.9–3.7)
Glucose, Bld: 89 mg/dL (ref 65–99)
Potassium: 5.7 mmol/L — ABNORMAL HIGH (ref 3.5–5.3)
Sodium: 138 mmol/L (ref 135–146)
Total Bilirubin: 0.7 mg/dL (ref 0.2–1.2)
Total Protein: 6.6 g/dL (ref 6.1–8.1)
eGFR: 91 mL/min/{1.73_m2} (ref >=60)

## 2020-12-19 LAB — CBC WITH DIFFERENTIAL/PLATELET
Absolute Monocytes: 710 cells/uL (ref 200–950)
Basophils Absolute: 70 cells/uL (ref 0–200)
Basophils Relative: 1.1 %
Eosinophils Absolute: 83 cells/uL (ref 15–500)
Eosinophils Relative: 1.3 %
HCT: 39.2 % (ref 35.0–45.0)
Hemoglobin: 13.2 g/dL (ref 11.7–15.5)
Lymphs Abs: 1184 cells/uL (ref 850–3900)
MCH: 32.7 pg (ref 27.0–33.0)
MCHC: 33.7 g/dL (ref 32.0–36.0)
MCV: 97 fL (ref 80.0–100.0)
MPV: 10.3 fL (ref 7.5–12.5)
Monocytes Relative: 11.1 %
Neutro Abs: 4352 cells/uL (ref 1500–7800)
Neutrophils Relative %: 68 %
Platelets: 251 10*3/uL (ref 140–400)
RBC: 4.04 10*6/uL (ref 3.80–5.10)
RDW: 12.4 % (ref 11.0–15.0)
Total Lymphocyte: 18.5 %
WBC: 6.4 10*3/uL (ref 3.8–10.8)

## 2020-12-19 LAB — LIPID PANEL
Cholesterol: 223 mg/dL — ABNORMAL HIGH (ref <200)
HDL: 120 mg/dL (ref >=50)
LDL Cholesterol (Calc): 86 mg/dL (calc)
Non-HDL Cholesterol (Calc): 103 mg/dL (calc) (ref <130)
Total CHOL/HDL Ratio: 1.9 (calc) (ref <5.0)
Triglycerides: 80 mg/dL (ref <150)

## 2020-12-21 ENCOUNTER — Other Ambulatory Visit: Payer: Self-pay

## 2020-12-21 ENCOUNTER — Other Ambulatory Visit: Payer: Self-pay | Admitting: *Deleted

## 2020-12-21 ENCOUNTER — Other Ambulatory Visit: Payer: Medicare Other

## 2020-12-21 ENCOUNTER — Other Ambulatory Visit: Payer: Self-pay | Admitting: Nurse Practitioner

## 2020-12-21 DIAGNOSIS — E875 Hyperkalemia: Secondary | ICD-10-CM

## 2020-12-22 LAB — BASIC METABOLIC PANEL WITH GFR
BUN: 16 mg/dL (ref 7–25)
CO2: 27 mmol/L (ref 20–32)
Calcium: 9.5 mg/dL (ref 8.6–10.4)
Chloride: 100 mmol/L (ref 98–110)
Creat: 0.71 mg/dL (ref 0.60–1.00)
Glucose, Bld: 86 mg/dL (ref 65–99)
Potassium: 4.6 mmol/L (ref 3.5–5.3)
Sodium: 135 mmol/L (ref 135–146)
eGFR: 89 mL/min/{1.73_m2} (ref >=60)

## 2021-01-05 ENCOUNTER — Other Ambulatory Visit: Payer: Self-pay | Admitting: Neurology

## 2021-01-05 ENCOUNTER — Telehealth: Payer: Self-pay | Admitting: Neurology

## 2021-01-05 NOTE — Telephone Encounter (Signed)
The MRI of the brain performed 12/21/2020 at [facility] was compared side-by-side to the MRI from 09/20/2016 performed locally  The MRI shows multiple T2/FLAIR hyperintense foci in the periventricular, juxtacortical and deep white matter.  None of the foci appear to be acute.  I compared the 2 studies and there did not appear to be any new MS lesions compared to 2018.  There were no new strokes or other acute findings.

## 2021-01-08 ENCOUNTER — Telehealth: Payer: Self-pay | Admitting: *Deleted

## 2021-01-08 NOTE — Telephone Encounter (Signed)
Called and spoke with pt about results per Dr. Garth Bigness note. Pt verbalized understanding.

## 2021-01-08 NOTE — Telephone Encounter (Signed)
-----   Message from Britt Bottom, MD sent at 01/05/2021 12:19 PM EDT ----- Regarding: MRI Please let her know:   I took a look at the MRI that you did 2 weeks ago at [facility] and compared it to the one from 2018.  The MRI looks okay.  It does show the old MS lesions but there are no new MS lesions or strokes on the current MRI

## 2021-02-01 ENCOUNTER — Other Ambulatory Visit: Payer: Self-pay | Admitting: Neurology

## 2021-02-02 ENCOUNTER — Other Ambulatory Visit: Payer: Self-pay | Admitting: Nurse Practitioner

## 2021-03-07 ENCOUNTER — Telehealth: Payer: Self-pay

## 2021-03-07 NOTE — Telephone Encounter (Signed)
I called the pt and advised we have received the attending physician statement of disability for [name] to sign off on(he has completed forms in the past).  I advised we had completed our part and verified when the forms were due.  Pt reports forms are not due until 03/18/2021. I advised I would have [name] review once he returns to the office/sign off if he agrees with information on 03/13/2021.  Pt verbalized understanding,agreement, and appreciation for the call.  PP returned to POD 1 until [name] returns for review.

## 2021-03-08 NOTE — Telephone Encounter (Signed)
[name] stopped in office today and signed form. Gave completed/signed form back to medical records to process for pt.

## 2021-03-15 NOTE — Telephone Encounter (Signed)
Pt has called to report that if the document has not already been completed and sent back it can be faxed back to the insurance company @ [phone]

## 2021-04-05 ENCOUNTER — Telehealth: Payer: Self-pay | Admitting: Neurology

## 2021-04-05 NOTE — Telephone Encounter (Signed)
Called pt back. She states cost of Rebif has gone up and having inj site fatigue. Would like to come off of medication if [name] approves. Aware we will speak with MD and call her back.

## 2021-04-05 NOTE — Telephone Encounter (Signed)
Called pt back. Advised I spoke w/ MD and since last MRI's stable, he is fine with her stopping Rebif at this time and will recheck MRI's in a year or so after her last one.  I called [facility] and cx rx Rebif on file.

## 2021-04-05 NOTE — Telephone Encounter (Signed)
Pt called wanting to discuss her REBIF 22 MCG/0.5ML SOSY with the RN. Please advise.

## 2021-04-08 ENCOUNTER — Other Ambulatory Visit: Payer: Self-pay | Admitting: Nurse Practitioner

## 2021-04-19 DIAGNOSIS — H52223 Regular astigmatism, bilateral: Secondary | ICD-10-CM | POA: Diagnosis not present

## 2021-04-19 DIAGNOSIS — H5213 Myopia, bilateral: Secondary | ICD-10-CM | POA: Diagnosis not present

## 2021-04-19 DIAGNOSIS — H43811 Vitreous degeneration, right eye: Secondary | ICD-10-CM | POA: Diagnosis not present

## 2021-04-19 DIAGNOSIS — H2513 Age-related nuclear cataract, bilateral: Secondary | ICD-10-CM | POA: Diagnosis not present

## 2021-04-19 DIAGNOSIS — H401131 Primary open-angle glaucoma, bilateral, mild stage: Secondary | ICD-10-CM | POA: Diagnosis not present

## 2021-04-19 DIAGNOSIS — H524 Presbyopia: Secondary | ICD-10-CM | POA: Diagnosis not present

## 2021-04-19 DIAGNOSIS — H04123 Dry eye syndrome of bilateral lacrimal glands: Secondary | ICD-10-CM | POA: Diagnosis not present

## 2021-04-27 ENCOUNTER — Other Ambulatory Visit: Payer: Self-pay | Admitting: *Deleted

## 2021-04-27 MED ORDER — CITALOPRAM HYDROBROMIDE 20 MG PO TABS: 30.0000 mg | ORAL_TABLET | Freq: Every day | ORAL | 5 refills | Status: DC

## 2021-04-27 NOTE — Telephone Encounter (Signed)
Patient requested refill

## 2021-04-30 ENCOUNTER — Other Ambulatory Visit: Payer: Self-pay | Admitting: Neurology

## 2021-05-01 ENCOUNTER — Telehealth: Payer: Self-pay | Admitting: *Deleted

## 2021-05-01 NOTE — Telephone Encounter (Signed)
Patient called and wanted to know what Colonoscopy Dr. Lazaro Arms you recommend her to go to.  Patient is requesting your advise.   Please Advise.

## 2021-05-02 NOTE — Telephone Encounter (Signed)
Is she needing a referral? We would be happy to place a referral and Lattie Haw can see who has availability. Has she previously seen a GI doctor in the area/

## 2021-05-02 NOTE — Telephone Encounter (Signed)
I called patient and a gentleman answered the phone and stated I had the wrong number, I double checked that I dialed the number correctly and it is the number that we have on file.  I called patients niece and verified the number and she confirmed that the number we have on file is correct and she will contact the patient and have her return our call.

## 2021-05-03 NOTE — Telephone Encounter (Signed)
Patient aware. MVI added to medication list

## 2021-05-03 NOTE — Telephone Encounter (Signed)
Spoke with patient, patient had a colonoscopy in 2018 with the [facility], Dr.Nandigam. Patient was given the number to call and schedule next colonoscopy.  Patient questions if she should take a multivitamin, please advise

## 2021-05-03 NOTE — Telephone Encounter (Signed)
That is fine to take a MVI daily

## 2021-05-07 ENCOUNTER — Telehealth: Payer: Self-pay | Admitting: Neurology

## 2021-05-07 NOTE — Telephone Encounter (Signed)
Pt's caretaker, Reino Bellis. Also add lamoTRIgine (LAMICTAL) 150 MG tablet for increase. Would like a call from the nurse.

## 2021-05-07 NOTE — Telephone Encounter (Signed)
Returned call. Crystal Huber picked up, she gave phone to pt. Pt reports TN pain has worsened in the last week. Unsure what caused episode. Has not eaten anything hard.  Taking gabapentin 300mg  po TID and lamotrigine 150mg  po TID. She started back on baclofen 10mg  tablet yesterday. Took one pill yesterday and one this morning. Gabapentin makes her sleepy. Aware I will discuss w/ MD and call her back.

## 2021-05-07 NOTE — Telephone Encounter (Signed)
Called pt and LVM for her to call office. Harriett (caretaker) not on DPR form.  Wanting to verify how she is taking current medications.

## 2021-05-07 NOTE — Telephone Encounter (Signed)
Pt's caretaker called stating that the pt is in a lot of pain due to her trigeminal neurologia and is wanting to know if she can get an increase in her pain medication gabapentin (NEURONTIN) 300 MG capsule Please advise.

## 2021-05-07 NOTE — Telephone Encounter (Signed)
Called and spoke w/ pt. Relayed [name] recommends having her increase lamotrigine 150mg  to 1 in the am, 1 in the afternoon and 2 in the pm as well as increasing gabapentin 300mg , 1 in the am, 1 in the afternoon, and 2 in the pm for the next two weeks. She can then resume taking both 1 tablet po TID. She verbalized understanding.

## 2021-05-07 NOTE — Telephone Encounter (Signed)
Returned phone call, would like a call back.

## 2021-06-05 ENCOUNTER — Other Ambulatory Visit: Payer: Self-pay | Admitting: Nurse Practitioner

## 2021-06-13 ENCOUNTER — Ambulatory Visit: Payer: Medicare Other | Admitting: Neurology

## 2021-06-14 ENCOUNTER — Ambulatory Visit: Payer: Medicare Other | Admitting: Neurology

## 2021-06-14 ENCOUNTER — Encounter: Payer: Self-pay | Admitting: Neurology

## 2021-06-14 VITALS — BP 158/77 | HR 53 | Ht 63.0 in | Wt 137.5 lb

## 2021-06-14 DIAGNOSIS — R269 Unspecified abnormalities of gait and mobility: Secondary | ICD-10-CM

## 2021-06-14 DIAGNOSIS — F413 Other mixed anxiety disorders: Secondary | ICD-10-CM | POA: Diagnosis not present

## 2021-06-14 DIAGNOSIS — G35 Multiple sclerosis: Secondary | ICD-10-CM | POA: Diagnosis not present

## 2021-06-14 DIAGNOSIS — G500 Trigeminal neuralgia: Secondary | ICD-10-CM

## 2021-06-14 DIAGNOSIS — G35D Multiple sclerosis, unspecified: Secondary | ICD-10-CM

## 2021-06-14 MED ORDER — LAMOTRIGINE 150 MG PO TABS: 150.0000 mg | ORAL_TABLET | Freq: Three times a day (TID) | ORAL | 3 refills | Status: DC

## 2021-06-14 NOTE — Progress Notes (Signed)
? ? ? ?[facility] ? ?PATIENT: Crystal Huber ?DOB: 1945/05/03 ? ?REFERRING DOCTOR OR PCP:  [place] ?SOURCE: Patient ? ?_________________________________ ? ? ?HISTORICAL ? ?CHIEF COMPLAINT:  ?Chief Complaint  ?Patient presents with  ? Follow-up  ?  RM 2, alone. Last seen 12/13/20. Ambulates with cane. Got new glasses about a month ago. Better for distance.   ? ? ?HISTORY OF PRESENT ILLNESS:  ?Crystal Huber is a 76 y.o. woman with relapsing remitting MS diagnosed in 2002. ? ?Update 06/14/2021 ?At the last visit, we stopped the Rebif and she feels better off the medication.  She feels her MS is mostly stable with gait actually doing better..     She has had more vertigo.   Otherwise, she has been fairly stable.   Otherwise, She feels her MS is mostly stable.    No new exacerbation ? ?Her gait is off balanced, She uses a cane sometimes when outside her home  but not inside now.   She feels she is able to walk 1/4 mile or more without a stop  Much better than    Vertigo resolved with meclizine 1/2 pill qAM   She denies weakness or numbness. ? ?She still experiences some trigeminal neuralgia though med's have helped.    She is on Lamotrigine 150 mg po tid and gabapentin 300 mg po tid     If pain is worse, she goes up o 4 lamotrigines (150 mg tid)   In the past, she [facility] [place] wih sm benefit . She takes  baclofen at night as needed as she is sleepy/groggy in the morning if she takes daily.    ? ?She notes notes some cognitive issues bu tthese are stable.    She has had some STM issues.  Usually she does better with hints.   She is not driving. .   Mood is doing well.  No depression. Some anxiety.   She feels less anxiety now that she has a roommate.  She sleeps well most nights.   ? ?Labs 03/03/2020 showed low Vit D (24).    TSH was fine.  B12 was normal.   LFT and Lymphocytes were fine  ? ?` ? ? ?  03/27/2020  ?  2:03 PM 03/27/2020  ?  1:40 PM   ?MMSE - Mini Mental State Exam  ?Orientation to time 3 3  ?Orientation to Place 5 5  ?Registration 3 3  ?Attention/ Calculation 2 2  ?Recall 3 3  ?Language- name 2 objects 2 2  ?Language- repeat 1 1  ?Language- follow 3 step command 3 3  ?Language- read & follow direction 1 1  ?Write a sentence 1 1  ?Copy design 1 1  ?Total score 25 25  ?Clock numbers poor ?   ?MS History:   She had MRI and LP consistent with the diagnosis of MS in 2002 after presenting with trigeminal neuralgia.   She was started on Rebif.   She is on Rebif 22 mcg, tolerates it well and has had no definite exacerbation since.      ? ?MRI of the brain 09/19/2016 shows classic MS lesions.  There were a few foci not present in 2012. ? ?MRI of the brain 12/21/2020 showed no new lesions. ? ? ?REVIEW OF SYSTEMS: ?Constitutional: No fevers, chills, sweats, or change in appetite.   Fatigue, worse as the day goes on. ?Eyes: No visual changes, double vision, eye pain ?Ear, nose and  throat: No hearing loss, ear pain, nasal congestion, sore throat ?Cardiovascular: No chest pain, palpitations ?Respiratory:  No shortness of breath at rest or with exertion.   No wheezes ?GastrointestinaI: No nausea, vomiting, diarrhea, abdominal pain, fecal incontinence ?Genitourinary:  No dysuria, urinary retention or frequency.  No nocturia. ?Musculoskeletal:  No neck pain, back pain ?Integumentary: No rash, pruritus, skin lesions ?Neurological: as above ?Psychiatric: Some depression and anxiety. ?Endocrine: No palpitations, diaphoresis, change in appetite, change in weigh or increased thirst ?Hematologic/Lymphatic:  No anemia, purpura, petechiae..   Bruises easily ?Allergic/Immunologic: No itchy/runny eyes, nasal congestion, recent allergic reactions, rashes ? ?ALLERGIES: ?Allergies  ?Allergen Reactions  ? Carbamazepine   ?  Decreased sodium level  ? Imipramine   ?  Night sweats, more intense dreams  ? Metronidazole Nausea Only  ? Oxcarbazepine   ?  Decreased sodium level  ?  Penicillins Swelling  ?  Yeast infection  ? Statins   ?  Gets hyponatremia and severe muscle pain  ? Sulfa Antibiotics Swelling  ?  yeast infection  ? ? ?HOME MEDICATIONS: ? ?Current Outpatient Medications:  ?  Biotin w/ Vitamins C & E (HAIR/SKIN/NAILS PO), Take 1 tablet by mouth daily., Disp: , Rfl:  ?  citalopram (CELEXA) 20 MG tablet, Take 1.5 tablets (30 mg total) by mouth daily., Disp: 45 tablet, Rfl: 5 ?  gabapentin (NEURONTIN) 300 MG capsule, TAKE ONE CAPSULE BY MOUTH THREE TIMES DAILY, Disp: 270 capsule, Rfl: 0 ?  hydrALAZINE (APRESOLINE) 50 MG tablet, TAKE ONE TABLET BY MOUTH TWICE DAILY, Disp: 180 tablet, Rfl: 1 ?  lactobacillus acidophilus (BACID) TABS tablet, Take 1 tablet by mouth daily., Disp: , Rfl:  ?  lamoTRIgine (LAMICTAL) 150 MG tablet, TAKE ONE TABLET BY MOUTH THREE TIMES DAILY, Disp: 270 tablet, Rfl: 1 ?  latanoprost (XALATAN) 0.005 % ophthalmic solution, Place 1 drop into both eyes at bedtime., Disp: , Rfl:  ?  losartan (COZAAR) 100 MG tablet, TAKE ONE TABLET BY MOUTH ONE TIME DAILY, Disp: 90 tablet, Rfl: 0 ?  meclizine (ANTIVERT) 25 MG tablet, 1/2 tablet daily, Disp: , Rfl:  ?  metoprolol succinate (TOPROL-XL) 50 MG 24 hr tablet, TAKE ONE TABLET BY MOUTH ONE TIME DAILY, Disp: 90 tablet, Rfl: 0 ?  Multiple Vitamin (MULTIVITAMIN) tablet, Take 1 tablet by mouth daily., Disp: , Rfl:  ?  timolol (TIMOPTIC) 0.5 % ophthalmic solution, Place 1 drop into both eyes daily. am, Disp: , Rfl:  ?  UNABLE TO FIND, Nature Made Multi vitamin, Disp: , Rfl:  ? ?Current Facility-Administered Medications:  ?  0.9 %  sodium chloride infusion, 500 mL, Intravenous, Continuous, Nandigam, Venia Minks, MD ? ?PAST MEDICAL HISTORY: ?Past Medical History:  ?Diagnosis Date  ? Arthritis   ? right pinkie  ? Cancer [facility])   ? right BR  CA   ? Cataract   ? right eye  ? GERD (gastroesophageal reflux disease)   ? prn tums, mild not often  ? Glaucoma   ? Hyperlipidemia   ? Hypertension   ? Multiple sclerosis ([place])   ? Neuromuscular  disorder ([place])   ? trigeminal neuralgia  ? Refusal of blood product   ? ? ?PAST SURGICAL HISTORY: ?Past Surgical History:  ?Procedure Laterality Date  ? BREAST LUMPECTOMY Right   ? wears prosthesis  ? COLONOSCOPY  12/20/2010  ? ganglion cyst removal Bilateral   ? PARTIAL HYSTERECTOMY    ? POLYPECTOMY    ? ? ?FAMILY HISTORY: ?Family History  ?Problem Relation Age of  Onset  ? Congestive Heart Failure Mother   ? Heart disease Father   ? Heart attack Father   ? Stroke Sister   ? Breast cancer Sister   ? Heart disease Sister   ? Heart disease Brother   ? Heart disease Brother   ? Heart disease Brother   ? Colon cancer Neg Hx   ? Colon polyps Neg Hx   ? ? ?SOCIAL HISTORY: ? ?Social History  ? ?Socioeconomic History  ? Marital status: Widowed  ?  Spouse name: Not on file  ? Number of children: Not on file  ? Years of education: Not on file  ? Highest education level: Not on file  ?Occupational History  ? Not on file  ?Tobacco Use  ? Smoking status: Former  ?  Years: 1.50  ?  Types: Cigarettes  ? Smokeless tobacco: Never  ? Tobacco comments:  ?  Quit smoking at age 89 and only smoke occasional x 1.5 years   ?Vaping Use  ? Vaping Use: Never used  ?Substance and Sexual Activity  ? Alcohol use: Yes  ?  Alcohol/week: 14.0 standard drinks  ?  Types: 14 Standard drinks or equivalent per week  ?  Comment: 1-2 glasses of wine at night/fim  ? Drug use: No  ? Sexual activity: Not on file  ?Other Topics Concern  ? Not on file  ?Social History Narrative  ? Diet  ?   ? Do you drink/eat things with caffeine  ?   ? Marital Status     What year were you married?  ?   ? Do you live in a house, apartment, assisted living, condo, trailer, etc.?  ?   ? Is it one or more stories?  ?   ? How many persons live in your home?     ?   ? Do you have any pets in your home?(please list)  ?   ? Highest level of education completed:  ?   ? Current or past profession:  ?   ? Do you exercise?:    Type and how often:  ?   ? Do you have a Living Will? (Form  that indicates scenarios where you would not want your life prolonged)  ?   ? Do you have a DNR form?         If not, would you like to discuss one?  ?   ? Do you have signed POA/HPOA forms? Yes  ?   ? Do

## 2021-06-25 ENCOUNTER — Encounter: Payer: Self-pay | Admitting: Nurse Practitioner

## 2021-06-25 ENCOUNTER — Ambulatory Visit (INDEPENDENT_AMBULATORY_CARE_PROVIDER_SITE_OTHER): Payer: Medicare Other | Admitting: Nurse Practitioner

## 2021-06-25 VITALS — BP 110/70 | HR 65 | Temp 97.9°F | Ht 63.0 in | Wt 140.0 lb

## 2021-06-25 DIAGNOSIS — G3184 Mild cognitive impairment, so stated: Secondary | ICD-10-CM | POA: Diagnosis not present

## 2021-06-25 DIAGNOSIS — Z1212 Encounter for screening for malignant neoplasm of rectum: Secondary | ICD-10-CM | POA: Diagnosis not present

## 2021-06-25 DIAGNOSIS — G500 Trigeminal neuralgia: Secondary | ICD-10-CM | POA: Diagnosis not present

## 2021-06-25 DIAGNOSIS — E7800 Pure hypercholesterolemia, unspecified: Secondary | ICD-10-CM | POA: Diagnosis not present

## 2021-06-25 DIAGNOSIS — I10 Essential (primary) hypertension: Secondary | ICD-10-CM | POA: Diagnosis not present

## 2021-06-25 DIAGNOSIS — Z1211 Encounter for screening for malignant neoplasm of colon: Secondary | ICD-10-CM | POA: Diagnosis not present

## 2021-06-25 DIAGNOSIS — F413 Other mixed anxiety disorders: Secondary | ICD-10-CM

## 2021-06-25 DIAGNOSIS — G35 Multiple sclerosis: Secondary | ICD-10-CM | POA: Diagnosis not present

## 2021-06-25 NOTE — Progress Notes (Signed)
? ? ?Careteam: ?Patient Care Team: ?[name], NP as PCP - General (Geriatric Medicine) ? ?PLACE OF SERVICE:  ?[facility]  ?Advanced Directive information ?Does Patient Have a Medical Advance Directive?: Yes, Type of Advance Directive: [place];Living will, Does patient want to make changes to medical advance directive?: No - Patient declined ? ?Allergies  ?Allergen Reactions  ? Carbamazepine   ?  Decreased sodium level  ? Imipramine   ?  Night sweats, more intense dreams  ? Metronidazole Nausea Only  ? Oxcarbazepine   ?  Decreased sodium level  ? Penicillins Swelling  ?  Yeast infection  ? Statins   ?  Gets hyponatremia and severe muscle pain  ? Sulfa Antibiotics Swelling  ?  yeast infection  ? ? ?Chief Complaint  ?Patient presents with  ? Medical Management of Chronic Issues  ?  6 month follow-up. Discuss need for shingrix, colonoscopy, td/tdap, and additional covid boosters or post pone/exclude if patient refuses. Patient denies receiving any vaccines since last visit. [facility] verified. Discuss B vitamin supplement. Here with friend Maudie Mercury   ? ? ? ?HPI: Patient is a 76 y.o. female for routine follow up ? ?She reports she is doing well. Laughing  a lot more recently.  ? ?Reports she has called her gastro doctor twice and she has not heard back.  ? ?Saw neurologist to follow up on MS- she has stopped rebif and they are following MRI routinely.  ?She continues on lamotrigine, gegretol and gabapentin for trigeminal neuralgia.  ? ?VIt D def- continues on supplement . ?Review of Systems:  ?Review of Systems  ?Constitutional:  Negative for chills, fever and weight loss.  ?HENT:  Negative for tinnitus.   ?Respiratory:  Negative for cough, sputum production and shortness of breath.   ?Cardiovascular:  Negative for chest pain, palpitations and leg swelling.  ?Gastrointestinal:  Negative for abdominal pain, constipation, diarrhea and heartburn.  ?Genitourinary:  Negative for dysuria, frequency and  urgency.  ?Musculoskeletal:  Negative for back pain, falls, joint pain and myalgias.  ?Skin: Negative.   ?Neurological:  Negative for dizziness and headaches.  ?Psychiatric/Behavioral:  Negative for depression and memory loss. The patient does not have insomnia.   ? ?Past Medical History:  ?Diagnosis Date  ? Arthritis   ? right pinkie  ? Cancer [facility])   ? right BR  CA   ? Cataract   ? right eye  ? GERD (gastroesophageal reflux disease)   ? prn tums, mild not often  ? Glaucoma   ? Hyperlipidemia   ? Hypertension   ? Multiple sclerosis ([place])   ? Neuromuscular disorder ([place])   ? trigeminal neuralgia  ? Refusal of blood product   ? ?Past Surgical History:  ?Procedure Laterality Date  ? BREAST LUMPECTOMY Right   ? wears prosthesis  ? COLONOSCOPY  12/20/2010  ? ganglion cyst removal Bilateral   ? PARTIAL HYSTERECTOMY    ? POLYPECTOMY    ? ?Social History: ?  reports that she has quit smoking. Her smoking use included cigarettes. She has never used smokeless tobacco. She reports current alcohol use of about 14.0 standard drinks per week. She reports that she does not use drugs. ? ?Family History  ?Problem Relation Age of Onset  ? Congestive Heart Failure Mother   ? Heart disease Father   ? Heart attack Father   ? Stroke Sister   ? Breast cancer Sister   ? Heart disease Sister   ? Heart disease  Brother   ? Heart disease Brother   ? Heart disease Brother   ? Colon cancer Neg Hx   ? Colon polyps Neg Hx   ? ? ?Medications: ?Patient's Medications  ?New Prescriptions  ? No medications on file  ?Previous Medications  ? BIOTIN W/ VITAMINS C & E (HAIR/SKIN/NAILS PO)    Take 1 tablet by mouth daily.  ? CITALOPRAM (CELEXA) 20 MG TABLET    Take 1.5 tablets (30 mg total) by mouth daily.  ? GABAPENTIN (NEURONTIN) 300 MG CAPSULE    TAKE ONE CAPSULE BY MOUTH THREE TIMES DAILY  ? HYDRALAZINE (APRESOLINE) 50 MG TABLET    TAKE ONE TABLET BY MOUTH TWICE DAILY  ? LACTOBACILLUS ACIDOPHILUS (BACID) TABS TABLET    Take 1 tablet by mouth daily.  ?  LAMOTRIGINE (LAMICTAL) 150 MG TABLET    Take 1 tablet (150 mg total) by mouth 3 (three) times daily.  ? LATANOPROST (XALATAN) 0.005 % OPHTHALMIC SOLUTION    Place 1 drop into both eyes at bedtime.  ? LOSARTAN (COZAAR) 100 MG TABLET    TAKE ONE TABLET BY MOUTH ONE TIME DAILY  ? MECLIZINE (ANTIVERT) 25 MG TABLET    1/2 tablet daily  ? METOPROLOL SUCCINATE (TOPROL-XL) 50 MG 24 HR TABLET    TAKE ONE TABLET BY MOUTH ONE TIME DAILY  ? MULTIPLE VITAMIN (MULTIVITAMIN) TABLET    Take 1 tablet by mouth daily.  ? TIMOLOL (TIMOPTIC) 0.5 % OPHTHALMIC SOLUTION    Place 1 drop into both eyes daily. am  ?Modified Medications  ? No medications on file  ?Discontinued Medications  ? UNABLE TO FIND    Nature Made Multi vitamin  ? ? ?Physical Exam: ? ?Vitals:  ? 06/25/21 1307  ?BP: 110/70  ?Pulse: 65  ?Temp: 97.9 ?F (36.6 ?C)  ?TempSrc: Temporal  ?SpO2: 96%  ?Weight: 140 lb (63.5 kg)  ?Height: _0  (1.6 m)  ? ?Body mass index is 24.8 kg/m?. ?Wt Readings from Last 3 Encounters:  ?06/25/21 140 lb (63.5 kg)  ?06/14/21 137 lb 8 oz (62.4 kg)  ?12/18/20 137 lb 3.2 oz (62.2 kg)  ? ? ?Physical Exam ?Constitutional:   ?   General: She is not in acute distress. ?   Appearance: She is well-developed. She is not diaphoretic.  ?HENT:  ?   Head: Normocephalic and atraumatic.  ?   Right Ear: External ear normal. There is no impacted cerumen.  ?   Left Ear: External ear normal. There is no impacted cerumen.  ?   Nose: Nose normal.  ?   Mouth/Throat:  ?   Pharynx: No oropharyngeal exudate.  ?Eyes:  ?   Conjunctiva/sclera: Conjunctivae normal.  ?   Pupils: Pupils are equal, round, and reactive to light.  ?Cardiovascular:  ?   Rate and Rhythm: Normal rate and regular rhythm.  ?   Heart sounds: Normal heart sounds.  ?Pulmonary:  ?   Effort: Pulmonary effort is normal.  ?   Breath sounds: Normal breath sounds.  ?Abdominal:  ?   General: Bowel sounds are normal.  ?   Palpations: Abdomen is soft.  ?Musculoskeletal:  ?   Cervical back: Normal range of motion  and neck supple.  ?   Right lower leg: No edema.  ?   Left lower leg: No edema.  ?Skin: ?   General: Skin is warm and dry.  ?Neurological:  ?   Mental Status: She is alert. Mental status is at baseline.  ?Psychiatric:     ?  Mood and Affect: Mood normal.  ? ? ?Labs reviewed: ?Basic Metabolic Panel: ?Recent Labs  ?  07/17/20 ?0000 12/18/20 ?1347 12/21/20 ?1123  ?NA 132* 138 135  ?K 3.8 5.7* 4.6  ?CL 100 102 100  ?CO2 25* 29 27  ?GLUCOSE  --  89 86  ?BUN _0 ?CREATININE 0.8 0.68 0.71  ?CALCIUM 8.6* 9.5 9.5  ? ?Liver Function Tests: ?Recent Labs  ?  07/17/20 ?0000 12/18/20 ?1347  ?AST 22 18  ?ALT 14 15  ?ALKPHOS 76  --   ?BILITOT  --  0.7  ?PROT  --  6.6  ?ALBUMIN 3.7  --   ? ?No results for input(s): LIPASE, AMYLASE in the last 8760 hours. ?No results for input(s): AMMONIA in the last 8760 hours. ?CBC: ?Recent Labs  ?  07/17/20 ?0000 12/18/20 ?1347  ?WBC 10.7 6.4  ?NEUTROABS 8.90 4,352  ?HGB 12.3 13.2  ?HCT 37 39.2  ?MCV  --  97.0  ?PLT 208 251  ? ?Lipid Panel: ?Recent Labs  ?  12/18/20 ?1347  ?CHOL 223*  ?HDL 120  ?[place] 86  ?TRIG 80  ?CHOLHDL 1.9  ? ?TSH: ?No results for input(s): TSH in the last 8760 hours. ?A1C: ?No results found for: HGBA1C ? ? ?Assessment/Plan ?1. Encounter for colorectal cancer screening ?- Ambulatory referral to Gastroenterology ? ?2. Multiple sclerosis ([place]) ?-doing well, followed by neurology routinely. She is currently off medication at this time ? ?3. Primary hypertension ?-Blood pressure well controlled ?Continue current medications ?Recheck metabolic panel ?- CMP with eGFR([facility]) ? ?4. Hypercholesterolemia ?-continue dietary modifications.  ?- CMP with eGFR([facility]) ? ?5. Trigeminal neuralgia ?-controlled at this time, will continue current regimen per neurology ? ?6. Other mixed anxiety disorders ?Stable on celexa 20 mg daily  ? ?7. MCI (mild cognitive impairment) ?-stable, encouraged to continue physical and cognitive activty.  ? ? ?Return in about 6 months (around 12/25/2021)  for routine follow up . ?Carlos American. Dewaine Oats, AGNP ? ?[facility] ?[phone]  ?

## 2021-06-25 NOTE — Patient Instructions (Signed)
To call [phone] to schedule bone density ? ?To get shingles, TDAP and covid booster at your local pharmacy  ? ? ?

## 2021-06-26 LAB — COMPLETE METABOLIC PANEL WITH GFR
AG Ratio: 1.6 (calc) (ref 1.0–2.5)
ALT: 13 U/L (ref 6–29)
AST: 18 U/L (ref 10–35)
Albumin: 3.9 g/dL (ref 3.6–5.1)
Alkaline phosphatase (APISO): 70 U/L (ref 37–153)
BUN: 20 mg/dL (ref 7–25)
CO2: 31 mmol/L (ref 20–32)
Calcium: 9.4 mg/dL (ref 8.6–10.4)
Chloride: 104 mmol/L (ref 98–110)
Creat: 0.76 mg/dL (ref 0.60–1.00)
Globulin: 2.5 g/dL (calc) (ref 1.9–3.7)
Glucose, Bld: 126 mg/dL (ref 65–139)
Potassium: 4.4 mmol/L (ref 3.5–5.3)
Sodium: 141 mmol/L (ref 135–146)
Total Bilirubin: 0.4 mg/dL (ref 0.2–1.2)
Total Protein: 6.4 g/dL (ref 6.1–8.1)
eGFR: 82 mL/min/{1.73_m2} (ref >=60)

## 2021-07-09 ENCOUNTER — Other Ambulatory Visit: Payer: Self-pay | Admitting: Neurology

## 2021-07-24 ENCOUNTER — Telehealth: Payer: Self-pay | Admitting: Neurology

## 2021-07-24 MED ORDER — GABAPENTIN 100 MG PO CAPS: 100.0000 mg | ORAL_CAPSULE | Freq: Three times a day (TID) | ORAL | 0 refills | Status: DC

## 2021-07-24 NOTE — Telephone Encounter (Signed)
I called patient. She has noticed a gradual increase in her left sided TN pain over the past couple of months. ? ?She is taking lamotrigine '150mg'$  TID and gabapentin '300mg'$  TID compliantly. ? ?Tegretol was tried in the past but d/c due to low sodium. ? ?Patient denies being too somnolent on her current medication regimen. ? ?She is wondering if there is anything else recommended for her TN pain by the [facility] in Dr. Garth Bigness absence. ? ?

## 2021-07-24 NOTE — Telephone Encounter (Addendum)
For additional as needed use, she can add 100 mg of gabapentin up to 3 times a day, she can add onto her current 300 mg 3 times daily regimen or in between doses. I sent a separate prescription for gabapentin 100 mg strength to her [place]. ?

## 2021-07-24 NOTE — Addendum Note (Signed)
Addended by: Star Age on: 07/24/2021 04:24 PM ? ? Modules accepted: Orders ? ?

## 2021-07-24 NOTE — Telephone Encounter (Signed)
Pt has called to report that her trigeminal neuralgia pain is  worsening and she'd like to know what can be recommended for her face pain ?

## 2021-07-24 NOTE — Telephone Encounter (Signed)
I called patient. I discussed this with her. I advised her of the extra gabapentin '100mg'$  TID in addition to her current regimen. I spent several minutes discussing with her. Pt verbalized understanding. ? ?

## 2021-07-29 ENCOUNTER — Ambulatory Visit
Admission: EM | Admit: 2021-07-29 | Discharge: 2021-07-29 | Disposition: A | Discharge status: Home / Self Care | Payer: Medicare Other

## 2021-07-29 ENCOUNTER — Emergency Department (HOSPITAL_BASED_OUTPATIENT_CLINIC_OR_DEPARTMENT_OTHER): Payer: Medicare Other

## 2021-07-29 ENCOUNTER — Other Ambulatory Visit: Payer: Self-pay

## 2021-07-29 ENCOUNTER — Emergency Department (HOSPITAL_BASED_OUTPATIENT_CLINIC_OR_DEPARTMENT_OTHER)
Admission: EM | Admit: 2021-07-29 | Discharge: 2021-07-29 | Disposition: A | Discharge status: Home / Self Care | Payer: Medicare Other | Attending: Emergency Medicine | Admitting: Emergency Medicine

## 2021-07-29 ENCOUNTER — Encounter (HOSPITAL_BASED_OUTPATIENT_CLINIC_OR_DEPARTMENT_OTHER): Payer: Self-pay | Admitting: Emergency Medicine

## 2021-07-29 DIAGNOSIS — S2243XA Multiple fractures of ribs, bilateral, initial encounter for closed fracture: Secondary | ICD-10-CM | POA: Diagnosis not present

## 2021-07-29 DIAGNOSIS — S0511XA Contusion of eyeball and orbital tissues, right eye, initial encounter: Secondary | ICD-10-CM

## 2021-07-29 DIAGNOSIS — R52 Pain, unspecified: Secondary | ICD-10-CM

## 2021-07-29 DIAGNOSIS — W19XXXA Unspecified fall, initial encounter: Secondary | ICD-10-CM | POA: Insufficient documentation

## 2021-07-29 DIAGNOSIS — S0990XA Unspecified injury of head, initial encounter: Secondary | ICD-10-CM | POA: Diagnosis not present

## 2021-07-29 DIAGNOSIS — R9431 Abnormal electrocardiogram [ECG] [EKG]: Secondary | ICD-10-CM | POA: Diagnosis not present

## 2021-07-29 DIAGNOSIS — R4020 Unspecified coma: Secondary | ICD-10-CM

## 2021-07-29 DIAGNOSIS — S0083XA Contusion of other part of head, initial encounter: Secondary | ICD-10-CM | POA: Diagnosis not present

## 2021-07-29 DIAGNOSIS — S62514A Nondisplaced fracture of proximal phalanx of right thumb, initial encounter for closed fracture: Secondary | ICD-10-CM | POA: Diagnosis not present

## 2021-07-29 DIAGNOSIS — S62515A Nondisplaced fracture of proximal phalanx of left thumb, initial encounter for closed fracture: Secondary | ICD-10-CM | POA: Diagnosis not present

## 2021-07-29 DIAGNOSIS — M79645 Pain in left finger(s): Secondary | ICD-10-CM | POA: Insufficient documentation

## 2021-07-29 DIAGNOSIS — S2241XA Multiple fractures of ribs, right side, initial encounter for closed fracture: Secondary | ICD-10-CM

## 2021-07-29 DIAGNOSIS — S6992XA Unspecified injury of left wrist, hand and finger(s), initial encounter: Secondary | ICD-10-CM | POA: Diagnosis present

## 2021-07-29 DIAGNOSIS — Z043 Encounter for examination and observation following other accident: Secondary | ICD-10-CM | POA: Diagnosis not present

## 2021-07-29 NOTE — ED Triage Notes (Signed)
Pt states she had a fall last night, she states she did hit her head on the floor (but does not recall much from the fall), she has brusing to her nose, left thumb and bruising to the right mid region of her back. Pt does report having soreness to the right side of her head and dizziness. The patient denies SOB. Patient does not display signs/ symptoms of respiratory distress.   ?

## 2021-07-29 NOTE — ED Provider Notes (Signed)
?[place] ? ? ?MRN: [id number] DOB: 08/18/1945 ? ?Subjective:  ? ?Crystal Huber is a 76 y.o. female presenting for an evaluation following a fall she had yesterday night.  Patient was at her home on her own.  Her niece reports that she may have been drinking.  Has a history of vertigo.  Unfortunately, the patient states that she was looking for a remote control, lost her footing causing her to fall onto carpet and hardwood floor.  She cannot recall exactly what happened and lost consciousness.  This caused an alarm to go off that alerted EMS.  Per her niece EMS had to come in through a window.  They evaluated her and deemed her stable.  Patient cannot recall if she was having vertigo symptoms with her fall.  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  Denies any active confusion, vision changes.  However, she does feel chest pain on the right side, right-sided body pain and right hip pain. ? ? ?Current Facility-Administered Medications:  ?  0.9 %  sodium chloride infusion, 500 mL, Intravenous, Continuous, Nandigam, Venia Minks, MD ? ?Current Outpatient Medications:  ?  Biotin w/ Vitamins C & E (HAIR/SKIN/NAILS PO), Take 1 tablet by mouth daily., Disp: , Rfl:  ?  citalopram (CELEXA) 20 MG tablet, Take 1.5 tablets (30 mg total) by mouth daily., Disp: 45 tablet, Rfl: 5 ?  gabapentin (NEURONTIN) 100 MG capsule, Take 1 capsule (100 mg total) by mouth 3 (three) times daily., Disp: 90 capsule, Rfl: 0 ?  gabapentin (NEURONTIN) 300 MG capsule, TAKE ONE CAPSULE BY MOUTH THREE TIMES DAILY, Disp: 270 capsule, Rfl: 0 ?  hydrALAZINE (APRESOLINE) 50 MG tablet, TAKE ONE TABLET BY MOUTH TWICE DAILY, Disp: 180 tablet, Rfl: 1 ?  lactobacillus acidophilus (BACID) TABS tablet, Take 1 tablet by mouth daily., Disp: , Rfl:  ?  lamoTRIgine (LAMICTAL) 150 MG tablet, Take 1 tablet (150 mg total) by mouth 3 (three) times daily., Disp: 270 tablet, Rfl: 3 ?  latanoprost (XALATAN) 0.005 % ophthalmic solution,  Place 1 drop into both eyes at bedtime., Disp: , Rfl:  ?  losartan (COZAAR) 100 MG tablet, TAKE ONE TABLET BY MOUTH ONE TIME DAILY, Disp: 90 tablet, Rfl: 0 ?  meclizine (ANTIVERT) 25 MG tablet, 1/2 tablet daily, Disp: , Rfl:  ?  metoprolol succinate (TOPROL-XL) 50 MG 24 hr tablet, TAKE ONE TABLET BY MOUTH ONE TIME DAILY, Disp: 90 tablet, Rfl: 0 ?  Multiple Vitamin (MULTIVITAMIN) tablet, Take 1 tablet by mouth daily., Disp: , Rfl:  ?  timolol (TIMOPTIC) 0.5 % ophthalmic solution, Place 1 drop into both eyes daily. am, Disp: , Rfl:   ? ?Allergies  ?Allergen Reactions  ? Carbamazepine   ?  Decreased sodium level  ? Imipramine   ?  Night sweats, more intense dreams  ? Metronidazole Nausea Only  ? Oxcarbazepine   ?  Decreased sodium level  ? Penicillins Swelling  ?  Yeast infection  ? Statins   ?  Gets hyponatremia and severe muscle pain  ? Sulfa Antibiotics Swelling  ?  yeast infection  ? ? ?Past Medical History:  ?Diagnosis Date  ? Arthritis   ? right pinkie  ? Cancer [facility])   ? right BR  CA   ? Cataract   ? right eye  ? GERD (gastroesophageal reflux disease)   ? prn tums, mild not often  ? Glaucoma   ? Hyperlipidemia   ? Hypertension   ?  Multiple sclerosis ([place])   ? Neuromuscular disorder ([place])   ? trigeminal neuralgia  ? Refusal of blood product   ?  ? ?Past Surgical History:  ?Procedure Laterality Date  ? BREAST LUMPECTOMY Right   ? wears prosthesis  ? COLONOSCOPY  12/20/2010  ? ganglion cyst removal Bilateral   ? PARTIAL HYSTERECTOMY    ? POLYPECTOMY    ? ? ?Family History  ?Problem Relation Age of Onset  ? Congestive Heart Failure Mother   ? Heart disease Father   ? Heart attack Father   ? Stroke Sister   ? Breast cancer Sister   ? Heart disease Sister   ? Heart disease Brother   ? Heart disease Brother   ? Heart disease Brother   ? Colon cancer Neg Hx   ? Colon polyps Neg Hx   ? ? ?Social History  ? ?Tobacco Use  ? Smoking status: Former  ?  Years: 1.50  ?  Types: Cigarettes  ? Smokeless tobacco: Never  ? Tobacco  comments:  ?  Quit smoking at age 22 and only smoke occasional x 1.5 years   ?Vaping Use  ? Vaping Use: Never used  ?Substance Use Topics  ? Alcohol use: Yes  ?  Alcohol/week: 14.0 standard drinks  ?  Types: 14 Standard drinks or equivalent per week  ?  Comment: 1-2 glasses of wine at night/fim  ? Drug use: No  ? ? ?ROS ? ? ?Objective:  ? ?Vitals: ?BP (!) 168/78 (BP Location: Left Arm)   Pulse 61   Temp 98.2 ?F (36.8 ?C) (Oral)   Resp 18   SpO2 95%  ? ?Physical Exam ?Constitutional:   ?   General: She is not in acute distress. ?   Appearance: Normal appearance. She is well-developed and normal weight. She is not ill-appearing, toxic-appearing or diaphoretic.  ?HENT:  ?   Head: Normocephalic and atraumatic.  ?   Right Ear: Tympanic membrane, ear canal and external ear normal. No drainage or tenderness. No middle ear effusion. There is no impacted cerumen. Tympanic membrane is not erythematous.  ?   Left Ear: Tympanic membrane, ear canal and external ear normal. No drainage or tenderness.  No middle ear effusion. There is no impacted cerumen. Tympanic membrane is not erythematous.  ?   Nose: Nose normal. No congestion or rhinorrhea.  ?   Mouth/Throat:  ?   Mouth: Mucous membranes are moist. No oral lesions.  ?   Pharynx: No pharyngeal swelling, oropharyngeal exudate, posterior oropharyngeal erythema or uvula swelling.  ?   Tonsils: No tonsillar exudate or tonsillar abscesses.  ?Eyes:  ?   General: Lids are normal. Lids are everted, no foreign bodies appreciated. Vision grossly intact. No scleral icterus.    ?   Right eye: No foreign body, discharge or hordeolum.     ?   Left eye: No foreign body, discharge or hordeolum.  ?   Extraocular Movements: Extraocular movements intact.  ?   Right eye: Normal extraocular motion.  ?   Left eye: Normal extraocular motion and no nystagmus.  ?   Conjunctiva/sclera: Conjunctivae normal.  ?   Right eye: Right conjunctiva is not injected. No chemosis, exudate or hemorrhage. ?    Left eye: Left conjunctiva is not injected. No chemosis, exudate or hemorrhage. ? ?Cardiovascular:  ?   Rate and Rhythm: Normal rate and regular rhythm.  ?   Heart sounds: Normal heart sounds. No murmur heard. ?  No friction rub.  No gallop.  ?Pulmonary:  ?   Effort: Pulmonary effort is normal. No respiratory distress.  ?   Breath sounds: No stridor. No wheezing, rhonchi or rales.  ?Chest:  ?   Chest wall: No tenderness.  ?Musculoskeletal:  ?   Cervical back: Normal range of motion and neck supple.  ?Lymphadenopathy:  ?   Cervical: No cervical adenopathy.  ?Skin: ?   General: Skin is warm and dry.  ?Neurological:  ?   General: No focal deficit present.  ?   Mental Status: She is alert and oriented to person, place, and time.  ?   Cranial Nerves: No cranial nerve deficit.  ?   Motor: No weakness.  ?   Coordination: Coordination normal.  ?   Gait: Gait normal.  ?   Deep Tendon Reflexes: Reflexes normal.  ?Psychiatric:     ?   Mood and Affect: Mood normal.     ?   Behavior: Behavior normal.  ? ? ?Assessment and Plan :  ? ?PDMP not reviewed this encounter. ? ?1. Loss of consciousness ([place])   ?2. Accidental fall, initial encounter   ?3. Pain of right side of body   ?4. Ecchymosis of right eye, initial encounter   ? ?Patient warrants a higher level of evaluation and care than we can provide in the urgent care setting.  This includes consideration for head CT and the imaging in addition to imaging to rule out fractures of the right side of her body.  I advised that she present to the emergency room now for this level of care.  She presents with 2 family members to contract for safety and will take her there now.  There is no sign of an acute encephalopathy and therefore will defer transport by EMS.  Patient will go by personal vehicle. ?  ?Jaynee Eagles, PA-C ?07/29/21 1109 ? ?

## 2021-07-29 NOTE — ED Notes (Signed)
Patient is being discharged from the Urgent Care and sent to the Emergency Department via POA with family. Per [facility], patient is in need of higher level of care due to need for further evaluation. Patient is aware and verbalizes understanding of plan of care.  ?Vitals:  ? 07/29/21 1026  ?BP: (!) 168/78  ?Pulse: 61  ?Resp: 18  ?Temp: 98.2 ?F (36.8 ?C)  ?SpO2: 95%  ?  ?

## 2021-07-29 NOTE — ED Triage Notes (Signed)
Pt arrives pov, slow gait with cane, referred by UC, c/o mechanical fall last night,endorses hitting head, unsure if loc, does not recall much from the fall), she has brusing to her nose, below right eye, left thumb and bruising to the right mid region of her back. Pt does report having soreness to the right side of her head. Denies shob or change in dizziness. AOx4 ?

## 2021-07-29 NOTE — Discharge Instructions (Signed)
Please head to the hospital now as you suffered a fall and had loss of consciousness. This requires a higher level of evaluation than we can provide in the urgent care setting. At the hospital you will be evaluated by a different team, they will order testing as appropriate including imaging such as a head CT scan.  ?

## 2021-07-29 NOTE — Discharge Instructions (Signed)
You have a fracture in the tip of your left thumb.  Wear splint for comfort and immobilization.  It is okay to remove the splint for shower and then put back on.  Recommend Tylenol and ice for pain control.  You also have 2 right-sided rib fractures as discussed.  This should heal well on their own. ?

## 2021-07-29 NOTE — ED Provider Notes (Signed)
?[place] EMERGENCY DEPARTMENT ?Provider Note ? ? ?CSN: [id number] ?Arrival date & time: 07/29/21  1123 ? ?  ? ?History ? ?Chief Complaint  ?Patient presents with  ? Fall  ? ? ?Crystal Huber is a 76 y.o. female. ? ?Patient with history of MS who presents to the ED after a fall last night.  Bruising to the right eye, right flank.  Sent from urgent care for work-up.  Has been ambulatory without any issues last night.  Per urgent care note may be concern for some alcohol use last night.  She denies any chest pain, shortness of breath, abdominal pain.  Has been at her baseline.  But given the bruising urgent care sent for imaging. ? ?The history is provided by the patient.  ? ?  ? ?Home Medications ?Prior to Admission medications   ?Medication Sig Start Date End Date Taking? Authorizing Provider  ?Biotin w/ Vitamins C & E (HAIR/SKIN/NAILS PO) Take 1 tablet by mouth daily.    [provider]  ?citalopram (CELEXA) 20 MG tablet Take 1.5 tablets (30 mg total) by mouth daily. 04/27/21   [name], NP  ?gabapentin (NEURONTIN) 100 MG capsule Take 1 capsule (100 mg total) by mouth 3 (three) times daily. 07/24/21   Star Age, MD  ?gabapentin (NEURONTIN) 300 MG capsule TAKE ONE CAPSULE BY MOUTH THREE TIMES DAILY 07/09/21   Sater, Nanine Means, MD  ?hydrALAZINE (APRESOLINE) 50 MG tablet TAKE ONE TABLET BY MOUTH TWICE DAILY 04/09/21   [name], NP  ?lactobacillus acidophilus (BACID) TABS tablet Take 1 tablet by mouth daily.    [provider]  ?lamoTRIgine (LAMICTAL) 150 MG tablet Take 1 tablet (150 mg total) by mouth 3 (three) times daily. 06/14/21   Sater, Nanine Means, MD  ?latanoprost (XALATAN) 0.005 % ophthalmic solution Place 1 drop into both eyes at bedtime.    [provider]  ?losartan (COZAAR) 100 MG tablet TAKE ONE TABLET BY MOUTH ONE TIME DAILY 06/05/21   [name], NP  ?meclizine (ANTIVERT) 25 MG tablet 1/2 tablet daily 08/04/15   [provider]   ?metoprolol succinate (TOPROL-XL) 50 MG 24 hr tablet TAKE ONE TABLET BY MOUTH ONE TIME DAILY 06/05/21   [name], NP  ?Multiple Vitamin (MULTIVITAMIN) tablet Take 1 tablet by mouth daily.    [provider]  ?timolol (TIMOPTIC) 0.5 % ophthalmic solution Place 1 drop into both eyes daily. am 03/21/20   [provider]  ?   ? ?Allergies    ?Carbamazepine, Imipramine, Metronidazole, Oxcarbazepine, Penicillins, Statins, and Sulfa antibiotics   ? ?Review of Systems   ?Review of Systems ? ?Physical Exam ?Updated Vital Signs ?BP (!) 186/76 (BP Location: Left Arm)   Pulse (!) 59   Temp 98.2 ?F (36.8 ?C) (Oral)   Resp 18   Ht '5\' 3"'$  (1.6 m)   Wt 63 kg   SpO2 96%   BMI 24.62 kg/m?  ?Physical Exam ?Vitals and nursing note reviewed.  ?Constitutional:   ?   General: She is not in acute distress. ?   Appearance: She is well-developed.  ?HENT:  ?   Head:  ?   Comments: Bruising around the right eye and nose ?   Nose: Nose normal.  ?   Mouth/Throat:  ?   Mouth: Mucous membranes are moist.  ?Eyes:  ?   Extraocular Movements: Extraocular movements intact.  ?   Conjunctiva/sclera: Conjunctivae normal.  ?   Pupils: Pupils are equal,  round, and reactive to light.  ?Cardiovascular:  ?   Rate and Rhythm: Normal rate and regular rhythm.  ?   Pulses: Normal pulses.  ?   Heart sounds: No murmur heard. ?Pulmonary:  ?   Effort: Pulmonary effort is normal. No respiratory distress.  ?   Breath sounds: Normal breath sounds.  ?Abdominal:  ?   Palpations: Abdomen is soft.  ?   Tenderness: There is no abdominal tenderness.  ?Musculoskeletal:     ?   General: No swelling or tenderness.  ?   Cervical back: Normal range of motion and neck supple. No tenderness.  ?   Comments: No midline spinal tenderness  ?Skin: ?   General: Skin is warm and dry.  ?   Capillary Refill: Capillary refill takes less than 2 seconds.  ?   Findings: Bruising present.  ?   Comments: Bruising to the right posterior rib  ?Neurological:  ?    Mental Status: She is alert.  ?Psychiatric:     ?   Mood and Affect: Mood normal.  ? ? ?ED Results / Procedures / Treatments   ?Labs ?(all labs ordered are listed, but only abnormal results are displayed) ?Labs Reviewed - No data to display ? ?EKG ?EKG Interpretation ? ?Date/Time:  Sunday Jul 29 2021 11:34:00 EDT ?Ventricular Rate:  63 ?PR Interval:  198 ?QRS Duration: 86 ?QT Interval:  442 ?QTC Calculation: 452 ?R Axis:   12 ?Text Interpretation: Normal sinus rhythm with sinus arrhythmia Normal ECG No previous ECGs available Confirmed by Lennice Sites ([id number]) on 07/29/2021 11:35:34 AM ? ?Radiology ?DG Ribs Unilateral W/Chest Right ? ?Result Date: 07/29/2021 ?CLINICAL DATA:  Fall EXAM: RIGHT RIBS AND CHEST - 3+ VIEW COMPARISON:  None Available. FINDINGS: Minimally displaced fractures of the lateral right fourth and fifth ribs, best seen on oblique view. There is no evidence of pneumothorax or pleural effusion. Surgical clips in the infrahilar right lung and right axilla. Both lungs are clear. Cardiomegaly. IMPRESSION: 1. Minimally displaced fractures of the lateral right fourth and fifth ribs, best seen on oblique view. No evidence of pneumothorax or pleural effusion. 2. Cardiomegaly. Electronically Signed   By: Delanna Ahmadi M.D.   On: 07/29/2021 12:24  ? ?CT Head Wo Contrast ? ?Result Date: 07/29/2021 ?CLINICAL DATA:  Fall, history of multiple sclerosis EXAM: CT HEAD WITHOUT CONTRAST CT MAXILLOFACIAL WITHOUT CONTRAST CT CERVICAL SPINE WITHOUT CONTRAST TECHNIQUE: Multidetector CT imaging of the head, cervical spine, and maxillofacial structures were performed using the standard protocol without intravenous contrast. Multiplanar CT image reconstructions of the cervical spine and maxillofacial structures were also generated. RADIATION DOSE REDUCTION: This exam was performed according to the departmental dose-optimization program which includes automated exposure control, adjustment of the mA and/or kV according to  patient size and/or use of iterative reconstruction technique. COMPARISON:  07/16/2020 FINDINGS: CT HEAD FINDINGS Brain: No evidence of acute infarction, hemorrhage, hydrocephalus, extra-axial collection or mass lesion/mass effect. Periventricular and deep white matter hypodensity. Vascular: No hyperdense vessel or unexpected calcification. CT FACIAL BONES FINDINGS Skull: Normal. Negative for fracture or focal lesion. Facial bones: No displaced fractures or dislocations. Sinuses/Orbits: No acute finding. Other: None. CT CERVICAL SPINE FINDINGS Alignment: Degenerative straightening of the normal cervical lordosis. Skull base and vertebrae: No acute fracture. No primary bone lesion or focal pathologic process. Soft tissues and spinal canal: No prevertebral fluid or swelling. No visible canal hematoma. Disc levels: Focally moderate disc space height loss and osteophytosis of the lower cervical levels from C5  through C7. Upper chest: Negative. Other: None. IMPRESSION: 1. No acute intracranial pathology. Periventricular and deep white matter hypodensity, in keeping with reported multiple sclerosis. 2. No displaced fractures or dislocations of the facial bones. 3. No fracture or static subluxation of the cervical spine. 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 Electronically Signed   By: Delanna Ahmadi M.D.   On: 07/29/2021 12:34  ? ?CT Cervical Spine Wo Contrast ? ?Result Date: 07/29/2021 ?CLINICAL DATA:  Fall, history of multiple sclerosis EXAM: CT HEAD WITHOUT CONTRAST CT MAXILLOFACIAL WITHOUT CONTRAST CT CERVICAL SPINE WITHOUT CONTRAST TECHNIQUE: Multidetector CT imaging of the head, cervical spine, and maxillofacial structures were performed using the standard protocol without intravenous contrast. Multiplanar CT image reconstructions of the cervical spine and maxillofacial structures were also generated. RADIATION DOSE REDUCTION: This exam was performed according to the departmental  dose-optimization program which includes automated exposure control, adjustment of the mA and/or kV according to patient size and/or use of iterative reconstruction technique. COMPARISON:  07/16/2020 FINDINGS: CT HEAD FINDINGS Brain:

## 2021-08-02 ENCOUNTER — Telehealth: Payer: Self-pay

## 2021-08-02 NOTE — Telephone Encounter (Signed)
May increase Meclizine to 25 mg tablet one by mouth daily as needed for dizziness.

## 2021-08-02 NOTE — Telephone Encounter (Signed)
Called patient and discussed with the patient.

## 2021-08-02 NOTE — Telephone Encounter (Signed)
Patient states she is getting more dizzy and currently taking 1/2 tab of meclizine. Should she increase to 1 tab?

## 2021-08-03 ENCOUNTER — Telehealth: Payer: Self-pay | Admitting: Neurology

## 2021-08-03 NOTE — Telephone Encounter (Signed)
Pt asking to be called to discuss directions on her Gabapentin

## 2021-08-06 DIAGNOSIS — M79645 Pain in left finger(s): Secondary | ICD-10-CM | POA: Diagnosis not present

## 2021-08-06 DIAGNOSIS — S62522A Displaced fracture of distal phalanx of left thumb, initial encounter for closed fracture: Secondary | ICD-10-CM | POA: Diagnosis not present

## 2021-08-06 NOTE — Telephone Encounter (Signed)
Called pt back. Went over directions of gabapentin with her again (KD,RN went over this with her on 07/24/21). Advised she should be taking gabapentin '300mg'$  po TID in addition to '100mg'$  po TID (total of '400mg'$  three times).  She has an appt to get to. She will call back later to discuss.

## 2021-08-13 ENCOUNTER — Telehealth: Payer: Self-pay | Admitting: Neurology

## 2021-08-13 MED ORDER — GABAPENTIN 300 MG PO CAPS: ORAL_CAPSULE | ORAL | 3 refills | Status: DC

## 2021-08-13 NOTE — Telephone Encounter (Signed)
I spoke to Crystal Huber and Jaquelyn Bitter.  Her trigeminal neuralgia has worsened over the last week.  She is currently taking lamotrigine 150 mg 3 times daily and gabapentin 300 mg 3 times daily (the last couple days has been taking 4 of the gabapentin's) she needs a new prescription for higher dose of gabapentin.  I will send in a prescription to take up to 5 a day to [facility].  Additionally, she has had more dizziness and has had 2 falls leading to emergency room visits for fractures.  I will see if we can get her worked into the schedule this week (looks like there may be an opening Tuesday afternoon)

## 2021-08-14 ENCOUNTER — Telehealth: Payer: Self-pay | Admitting: Neurology

## 2021-08-14 ENCOUNTER — Encounter: Payer: Self-pay | Admitting: Neurology

## 2021-08-14 ENCOUNTER — Ambulatory Visit: Payer: Medicare Other | Admitting: Neurology

## 2021-08-14 VITALS — BP 144/74 | HR 59 | Ht 63.0 in | Wt 137.0 lb

## 2021-08-14 DIAGNOSIS — R42 Dizziness and giddiness: Secondary | ICD-10-CM

## 2021-08-14 DIAGNOSIS — R269 Unspecified abnormalities of gait and mobility: Secondary | ICD-10-CM

## 2021-08-14 DIAGNOSIS — G35 Multiple sclerosis: Secondary | ICD-10-CM

## 2021-08-14 DIAGNOSIS — F413 Other mixed anxiety disorders: Secondary | ICD-10-CM | POA: Diagnosis not present

## 2021-08-14 DIAGNOSIS — G500 Trigeminal neuralgia: Secondary | ICD-10-CM

## 2021-08-14 NOTE — Telephone Encounter (Signed)
I have called [facility] pharmacy to advised that back around the 19th the patient had increased her previous script to 4 times a day. After the office visit that took place today the script was increase to 5 times a day.  Advised the pharmacy that she had started taking up to 4 times a day earlier in the month of may as directed by Dr Felecia Shelling and then after the conversation had yesterday and ov today he has increased up to 5 times a day. Advised she has ran out of her previous medication due to the additional tablet she has been taking.  The pharmacist will call the patients insurance to see about over riding the script for the pt.

## 2021-08-14 NOTE — Progress Notes (Signed)
[facility]  PATIENT: Crystal Huber DOB: 19-Jan-1946  REFERRING DOCTOR OR PCP:  Vedia Coffer SOURCE: Patient  _________________________________   HISTORICAL  CHIEF COMPLAINT:  Chief Complaint  Patient presents with   Follow-up    Rm 1, w niece and POA. Here for worsening TN. Pt has been felling dizzy and has had falls and seen at the ED.     HISTORY OF PRESENT ILLNESS:  Crystal Huber is a 76 y.o. woman with relapsing remitting MS diagnosed in 2002.  Update 08/14/2021 She is having more pain from trigeminal neuralgia x several weeks She is on Lamotrigine 150 mg po tid and gabapentin 300 mg po tid usually but up to 5 during a flare (she just started higher dose).     In the past, she had [facility] [place]) with  benefit x 1 month, last done about 1 years ago (Dr. Darral Dash).   She takes  baclofen at night as needed as she is sleepy/groggy in the morning if she takes daily.   Carbamezapine caused hyponatremia  At the last visit, we stopped the Rebif and she feels better off the medication.  She feels her MS is mostly stable  She is having more vertigo.   Bending over ad back up may trigger a spell.  Turning over in bed does not.    She has had 2 falls and was dizzy first both times.      With one fall, she fractured a rib and left thumb.   She sometimes takes meclizine but not sure if it helps.     Her gait is off balanced, She uses a cane more the past year.   On a good day, she can walk a few hundred feet.    She denies much weakness or numbness.  She continues to experience mild cognitive issues and her niece feels they are worse than last year.  Short-term memory issues bother her the most..  Usually she does better with hints.   She is not driving. .   Mood is doing well.  Notes feeling sad but no definite depression. She has some anxiety.   She feels less anxiety now that she has a roommate.  She sleeps well most  nights.    Labs 03/03/2020 showed low Vit D (24) and she takes supplements.   TSH was fine.  B12 was normal.   LFT and Lymphocytes were fine   `     03/27/2020    2:03 PM 03/27/2020    1:40 PM  MMSE - Mini Mental State Exam  Orientation to time 3 3  Orientation to Place 5 5  Registration 3 3  Attention/ Calculation 2 2  Recall 3 3  Language- name 2 objects 2 2  Language- repeat 1 1  Language- follow 3 step command 3 3  Language- read & follow direction 1 1  Write a sentence 1 1  Copy design 1 1  Total score 25 25  Clock numbers poor    MS History:   She had MRI and LP consistent with the diagnosis of MS in 2002 after presenting with trigeminal neuralgia.   She was started on Rebif.   She is on Rebif 22 mcg, tolerates it well and has had no definite exacerbation since.       MRI of the brain 09/19/2016 shows classic MS lesions.  There were a few foci not present in 2012.  MRI of the  brain 12/21/2020 showed no new lesions.   REVIEW OF SYSTEMS: Constitutional: No fevers, chills, sweats, or change in appetite.   Fatigue, worse as the day goes on. Eyes: No visual changes, double vision, eye pain Ear, nose and throat: No hearing loss, ear pain, nasal congestion, sore throat Cardiovascular: No chest pain, palpitations Respiratory:  No shortness of breath at rest or with exertion.   No wheezes GastrointestinaI: No nausea, vomiting, diarrhea, abdominal pain, fecal incontinence Genitourinary:  No dysuria, urinary retention or frequency.  No nocturia. Musculoskeletal:  No neck pain, back pain Integumentary: No rash, pruritus, skin lesions Neurological: as above Psychiatric: Some depression and anxiety. Endocrine: No palpitations, diaphoresis, change in appetite, change in weigh or increased thirst Hematologic/Lymphatic:  No anemia, purpura, petechiae..   Bruises easily Allergic/Immunologic: No itchy/runny eyes, nasal congestion, recent allergic reactions, rashes  ALLERGIES: Allergies   Allergen Reactions   Carbamazepine     Decreased sodium level   Imipramine     Night sweats, more intense dreams   Metronidazole Nausea Only   Oxcarbazepine     Decreased sodium level   Penicillins Swelling    Yeast infection   Statins     Gets hyponatremia and severe muscle pain   Sulfa Antibiotics Swelling    yeast infection    HOME MEDICATIONS:  Current Outpatient Medications:    Biotin w/ Vitamins C & E (HAIR/SKIN/NAILS PO), Take 1 tablet by mouth daily., Disp: , Rfl:    citalopram (CELEXA) 20 MG tablet, Take 1.5 tablets (30 mg total) by mouth daily., Disp: 45 tablet, Rfl: 5   gabapentin (NEURONTIN) 300 MG capsule, Take 1 or 2 pills p.o. 3 times a day (up to 5 a day), Disp: 450 capsule, Rfl: 3   hydrALAZINE (APRESOLINE) 50 MG tablet, TAKE ONE TABLET BY MOUTH TWICE DAILY, Disp: 180 tablet, Rfl: 1   lactobacillus acidophilus (BACID) TABS tablet, Take 1 tablet by mouth daily., Disp: , Rfl:    lamoTRIgine (LAMICTAL) 150 MG tablet, Take 1 tablet (150 mg total) by mouth 3 (three) times daily., Disp: 270 tablet, Rfl: 3   latanoprost (XALATAN) 0.005 % ophthalmic solution, Place 1 drop into both eyes at bedtime., Disp: , Rfl:    losartan (COZAAR) 100 MG tablet, TAKE ONE TABLET BY MOUTH ONE TIME DAILY, Disp: 90 tablet, Rfl: 0   meclizine (ANTIVERT) 25 MG tablet, Take 25 mg by mouth as needed., Disp: , Rfl:    metoprolol succinate (TOPROL-XL) 50 MG 24 hr tablet, TAKE ONE TABLET BY MOUTH ONE TIME DAILY, Disp: 90 tablet, Rfl: 0   Multiple Vitamin (MULTIVITAMIN) tablet, Take 1 tablet by mouth daily., Disp: , Rfl:    timolol (TIMOPTIC) 0.5 % ophthalmic solution, Place 1 drop into both eyes daily. am, Disp: , Rfl:   Current Facility-Administered Medications:    0.9 %  sodium chloride infusion, 500 mL, Intravenous, Continuous, Nandigam, Kavitha V, MD  PAST MEDICAL HISTORY: Past Medical History:  Diagnosis Date   Arthritis    right pinkie   Cancer ([place])    right BR  CA    Cataract     right eye   GERD (gastroesophageal reflux disease)    prn tums, mild not often   Glaucoma    Hyperlipidemia    Hypertension    Multiple sclerosis ([place])    Neuromuscular disorder ([place])    trigeminal neuralgia   Refusal of blood product     PAST SURGICAL HISTORY: Past Surgical History:  Procedure Laterality Date   BREAST  LUMPECTOMY Right    wears prosthesis   COLONOSCOPY  12/20/2010   ganglion cyst removal Bilateral    PARTIAL HYSTERECTOMY     POLYPECTOMY      FAMILY HISTORY: Family History  Problem Relation Age of Onset   Congestive Heart Failure Mother    Heart disease Father    Heart attack Father    Stroke Sister    Breast cancer Sister    Heart disease Sister    Heart disease Brother    Heart disease Brother    Heart disease Brother    Colon cancer Neg Hx    Colon polyps Neg Hx     SOCIAL HISTORY:  Social History   Socioeconomic History   Marital status: Widowed    Spouse name: Not on file   Number of children: Not on file   Years of education: Not on file   Highest education level: Not on file  Occupational History   Not on file  Tobacco Use   Smoking status: Former    Years: 1.50    Types: Cigarettes   Smokeless tobacco: Never   Tobacco comments:    Quit smoking at age 46 and only smoke occasional x 1.5 years   Vaping Use   Vaping Use: Never used  Substance and Sexual Activity   Alcohol use: Yes    Alcohol/week: 14.0 standard drinks    Types: 14 Standard drinks or equivalent per week    Comment: 1-2 glasses of wine at night/fim   Drug use: No   Sexual activity: Not on file  Other Topics Concern   Not on file  Social History Narrative   Diet      Do you drink/eat things with caffeine      Marital Status     What year were you married?      Do you live in a house, apartment, assisted living, condo, trailer, etc.?      Is it one or more stories?      How many persons live in your home?         Do you have any pets in your  home?(please list)      Highest level of education completed:      Current or past profession:      Do you exercise?:    Type and how often:      Do you have a Living Will? (Form that indicates scenarios where you would not want your life prolonged)      Do you have a DNR form?         If not, would you like to discuss one?      Do you have signed POA/HPOA forms? Yes      Do you have difficulty bathing or dressing yourself?      Do you have difficulty preparing food or eating?      Do you have difficulty managing medications?      Do you have difficulty managing your finances?      Do you have difficulty affording your medications?                     Social Determinants of Health   Financial Resource Strain: Not on file  Food Insecurity: Not on file  Transportation Needs: Not on file  Physical Activity: Not on file  Stress: Not on file  Social Connections: Not on file  Intimate Partner Violence: Not on file  PHYSICAL EXAM  Vitals:   08/14/21 1345  BP: (!) 144/74  Pulse: (!) 59  Weight: 137 lb (62.1 kg)  Height: '5\' 3"'$  (1.6 m)    Body mass index is 24.27 kg/m.    General: The patient is well-developed and well-nourished and in no acute distress  Neck:  The neck has good range of motion.  Neurologic Exam  Mental status: The patient is alert and oriented x 3 at the time of the examination. The patient has apparent normal recent and remote memory, with an apparently normal attention span and concentration ability.   Speech is normal.  Cranial nerves: Extraocular movements are full. No nystagmus.  Facial strength and sensation was normal.  She has reduced left-sided hearing.. The Weber does not lateralize.   Motor: Tremor not notable. Muscle bulk is normal.    normal muscle tone in the arms strength is  5 / 5 in all 4 extremities.   Sensory: She has normal symmetric sensation to touch and vibration.  Coordination: Cerebellar testing shows good finger  nose finger and heel to shin.   Gait and station: Station is normal.  The gait is mildly wide.  Tandem gait is poor...   Romberg is negative...    Reflexes: Deep tendon reflexes are symmetric and normal bilaterally.        ASSESSMENT AND PLAN  Multiple sclerosis (HCC)  Trigeminal neuralgia  Gait disorder  Other mixed anxiety disorders  Vertigo   1.   Remain off the Rebif.   We will check MRI of the brain every year or so for several years to make sure that there are no new lesions.  If there is progression, we can reinstate Rebif for different disease modifying therapy.  We can check her next MRI around the time of her next visit. 2.   Continue Lamotrigine 150 mg po tid and gabapentin at higher dose for Trigeminal neuralgia.   If not better next week, refer to [facility] for TN procedure (saw Dr. Darral Dash last time 07/2020).  Baclofen.    3.   Cognition may be slightly worse.    Advised not to drive 4.   Continue to be active.  Gait is stable.   Continue vitamin D supplements  Rtc 6 months, sooner if problems  42-minute office visit with the majority of the time spent face-to-face for history and physical, discussion/counseling and decision-making.  Additional time with record review and documentation.  Larue Drawdy A. Felecia Shelling, MD, PhD 9/70/2637, 8:58 PM Certified in Neurology, Clinical Neurophysiology, Sleep Medicine, Pain Medicine and Neuroimaging  [facility] [address] [place], [place] [address] [phone]

## 2021-08-14 NOTE — Telephone Encounter (Signed)
Niece called and spoke w/ Jael in phone room. Accepted appt with [name] for today at 2pm, check in 1:30pm. I scheduled pt.

## 2021-08-14 NOTE — Telephone Encounter (Signed)
Patient is completely out of gabapentin 300 mg - pharmacy will not refill said too soon. Best call back [phone]

## 2021-08-14 NOTE — Telephone Encounter (Signed)
Called the patient back to let her know I spoke with [facility] and they will be contacting her insurance to have them allow over ride

## 2021-08-14 NOTE — Telephone Encounter (Signed)
Patient seen today with [name]. Went to fill her Rx on gabapentin 300 mg - pharmacy would not do it, too soon. She is completely out. Best call back [phone]

## 2021-08-15 ENCOUNTER — Encounter (HOSPITAL_BASED_OUTPATIENT_CLINIC_OR_DEPARTMENT_OTHER): Payer: Self-pay | Admitting: Emergency Medicine

## 2021-08-15 ENCOUNTER — Other Ambulatory Visit: Payer: Self-pay

## 2021-08-15 ENCOUNTER — Telehealth: Payer: Self-pay | Admitting: Neurology

## 2021-08-15 ENCOUNTER — Emergency Department (HOSPITAL_BASED_OUTPATIENT_CLINIC_OR_DEPARTMENT_OTHER)
Admission: EM | Admit: 2021-08-15 | Discharge: 2021-08-15 | Disposition: A | Discharge status: Home / Self Care | Payer: Medicare Other | Attending: Emergency Medicine | Admitting: Emergency Medicine

## 2021-08-15 DIAGNOSIS — G500 Trigeminal neuralgia: Secondary | ICD-10-CM | POA: Diagnosis not present

## 2021-08-15 DIAGNOSIS — Z79899 Other long term (current) drug therapy: Secondary | ICD-10-CM | POA: Diagnosis not present

## 2021-08-15 DIAGNOSIS — Z853 Personal history of malignant neoplasm of breast: Secondary | ICD-10-CM | POA: Insufficient documentation

## 2021-08-15 DIAGNOSIS — I10 Essential (primary) hypertension: Secondary | ICD-10-CM | POA: Insufficient documentation

## 2021-08-15 DIAGNOSIS — R519 Headache, unspecified: Secondary | ICD-10-CM | POA: Diagnosis present

## 2021-08-15 MED ORDER — HYDROCODONE-ACETAMINOPHEN 5-325 MG PO TABS: 1.0000 | ORAL_TABLET | ORAL | 0 refills | Status: DC | PRN

## 2021-08-15 NOTE — Telephone Encounter (Signed)
[employer] medicare NPR sent to [facility] they will call the patient to schedule open MRI

## 2021-08-15 NOTE — ED Triage Notes (Signed)
Left facial pain , Hx trigeminal neuralgia , on gabapentin yet no relief .

## 2021-08-15 NOTE — ED Provider Notes (Signed)
[facility] Provider Note   CSN: [id number] Arrival date & time: 08/15/21  1205     History {Add pertinent medical, surgical, social history, OB history to HPI:1} Chief Complaint  Patient presents with   Facial Pain    Crystal Huber is a 76 y.o. female.  Pt is a 76 yo       Home Medications Prior to Admission medications   Medication Sig Start Date End Date Taking? Authorizing Provider  HYDROcodone-acetaminophen (NORCO/VICODIN) 5-325 MG tablet Take 1 tablet by mouth every 4 (four) hours as needed. 08/15/21  Yes Isla Pence, MD  Biotin w/ Vitamins C & E (HAIR/SKIN/NAILS PO) Take 1 tablet by mouth daily.    [provider]  citalopram (CELEXA) 20 MG tablet Take 1.5 tablets (30 mg total) by mouth daily. 04/27/21   [name], NP  gabapentin (NEURONTIN) 300 MG capsule Take 1 or 2 pills p.o. 3 times a day (up to 5 a day) 08/13/21   Sater, Nanine Means, MD  hydrALAZINE (APRESOLINE) 50 MG tablet TAKE ONE TABLET BY MOUTH TWICE DAILY 04/09/21   [name], NP  lactobacillus acidophilus (BACID) TABS tablet Take 1 tablet by mouth daily.    [provider]  lamoTRIgine (LAMICTAL) 150 MG tablet Take 1 tablet (150 mg total) by mouth 3 (three) times daily. 06/14/21   Sater, Nanine Means, MD  latanoprost (XALATAN) 0.005 % ophthalmic solution Place 1 drop into both eyes at bedtime.    [provider]  losartan (COZAAR) 100 MG tablet TAKE ONE TABLET BY MOUTH ONE TIME DAILY 06/05/21   [name], NP  meclizine (ANTIVERT) 25 MG tablet Take 25 mg by mouth as needed. 08/04/15   [provider]  metoprolol succinate (TOPROL-XL) 50 MG 24 hr tablet TAKE ONE TABLET BY MOUTH ONE TIME DAILY 06/05/21   [name], NP  Multiple Vitamin (MULTIVITAMIN) tablet Take 1 tablet by mouth daily.    [provider]  timolol (TIMOPTIC) 0.5 % ophthalmic solution Place 1 drop into both eyes daily. am 03/21/20   [provider]      Allergies    Carbamazepine, Imipramine, Metronidazole, Oxcarbazepine, Penicillins, Statins, and Sulfa antibiotics    Review of Systems   Review of Systems  Physical Exam Updated Vital Signs BP (!) 183/95   Pulse (!) 51   Temp 98 F (36.7 C) (Oral)   Resp 16   Ht 5' 3.5" (1.613 m)   Wt 62.1 kg   SpO2 98%   BMI 23.89 kg/m  Physical Exam  ED Results / Procedures / Treatments   Labs (all labs ordered are listed, but only abnormal results are displayed) Labs Reviewed - No data to display  EKG None  Radiology No results found.  Procedures Procedures  {Document cardiac monitor, telemetry assessment procedure when appropriate:1}  Medications Ordered in ED Medications - No data to display  ED Course/ Medical Decision Making/ A&P                           Medical Decision Making Risk Prescription drug management.   ***  {Document critical care time when appropriate:1} {Document review of labs and clinical decision tools ie heart score, Chads2Vasc2 etc:1}  {Document your independent review of radiology images, and any outside records:1} {Document your discussion with family members, caretakers, and with consultants:1} {Document social determinants of health affecting pt's care:1} {Document your decision making why  or why not admission, treatments were needed:1} Final Clinical Impression(s) / ED Diagnoses Final diagnoses:  Trigeminal neuralgia of left side of face    Rx / DC Orders ED Discharge Orders          Ordered    HYDROcodone-acetaminophen (NORCO/VICODIN) 5-325 MG tablet  Every 4 hours PRN        08/15/21 1329

## 2021-08-16 NOTE — Telephone Encounter (Signed)
scheduled 6/9 115pm

## 2021-08-18 ENCOUNTER — Telehealth: Payer: Self-pay | Admitting: Neurology

## 2021-08-18 DIAGNOSIS — G500 Trigeminal neuralgia: Secondary | ICD-10-CM

## 2021-08-18 MED ORDER — HYDROCODONE-ACETAMINOPHEN 5-325 MG PO TABS: 1.0000 | ORAL_TABLET | ORAL | 0 refills | Status: DC | PRN

## 2021-08-18 NOTE — Telephone Encounter (Signed)
Having more pain and went to the ED 3 days ago - higher dose of  gabapentin and lamotrigine have not helped.   Hydrocodone helps a little.     Send in referral to Dr. Darral Dash who did TN injection in past Refill hydrocodone

## 2021-08-23 ENCOUNTER — Telehealth: Payer: Self-pay | Admitting: Neurology

## 2021-08-23 NOTE — Telephone Encounter (Signed)
Referral for Pain Management sent to [facility] [phone].

## 2021-08-28 DIAGNOSIS — G35 Multiple sclerosis: Secondary | ICD-10-CM | POA: Diagnosis not present

## 2021-08-28 DIAGNOSIS — G500 Trigeminal neuralgia: Secondary | ICD-10-CM | POA: Diagnosis not present

## 2021-08-28 DIAGNOSIS — R519 Headache, unspecified: Secondary | ICD-10-CM | POA: Diagnosis not present

## 2021-09-01 ENCOUNTER — Other Ambulatory Visit: Payer: Self-pay | Admitting: Nurse Practitioner

## 2021-09-04 ENCOUNTER — Encounter: Payer: Self-pay | Admitting: Gastroenterology

## 2021-09-06 ENCOUNTER — Telehealth: Payer: Self-pay | Admitting: Neurology

## 2021-09-06 NOTE — Telephone Encounter (Signed)
Called pt back. Pt never got the results of her MRI brrain that she completed 08/28/21 at [facility].   She also wanted to know if she can see Dr. Reatha Armour instead of Dr. Darral Dash. I placed her on hold and spoke with [name]. He was able to pull up MRI images. Relayed per [name] that MRI brain showed stable MS, no new lesions. Ok for her to see Dr. Reatha Armour. She states she already has appt scheduled with him on 09/10/21. Does not need [place] to place referral. She will call back if anything else is needed.

## 2021-09-06 NOTE — Telephone Encounter (Signed)
Pt is asking for a call to discuss her seeing Dr Dawley @ [facility] re: her Trigeminal neuralgia

## 2021-09-10 DIAGNOSIS — R519 Headache, unspecified: Secondary | ICD-10-CM | POA: Diagnosis not present

## 2021-09-11 ENCOUNTER — Telehealth: Payer: Self-pay | Admitting: *Deleted

## 2021-09-11 NOTE — Telephone Encounter (Signed)
Patient called and left message on Clinical intake Voicemail requesting a Handicap Placard.   Is this ok to fill out and place in your folder to sign Please Advise.

## 2021-09-12 NOTE — Telephone Encounter (Signed)
Filled out Handicap Placard and placed in Dinah's folder to review and sign.

## 2021-09-13 NOTE — Telephone Encounter (Signed)
Dinah could not sign Handicap Placard. Stated that she reviewed OV note and it stated that patient was doing well. Stated that there is no documentation in note as to why she would need the placard.   Handicap Placard placed in [place] folder to review.

## 2021-09-24 ENCOUNTER — Emergency Department (HOSPITAL_BASED_OUTPATIENT_CLINIC_OR_DEPARTMENT_OTHER)
Admission: EM | Admit: 2021-09-24 | Discharge: 2021-09-24 | Disposition: A | Discharge status: Home / Self Care | Payer: Medicare Other | Attending: Emergency Medicine | Admitting: Emergency Medicine

## 2021-09-24 ENCOUNTER — Emergency Department (HOSPITAL_BASED_OUTPATIENT_CLINIC_OR_DEPARTMENT_OTHER): Payer: Medicare Other

## 2021-09-24 ENCOUNTER — Encounter (HOSPITAL_BASED_OUTPATIENT_CLINIC_OR_DEPARTMENT_OTHER): Payer: Self-pay

## 2021-09-24 DIAGNOSIS — Z79899 Other long term (current) drug therapy: Secondary | ICD-10-CM | POA: Diagnosis not present

## 2021-09-24 DIAGNOSIS — S8001XA Contusion of right knee, initial encounter: Secondary | ICD-10-CM | POA: Insufficient documentation

## 2021-09-24 DIAGNOSIS — W1830XA Fall on same level, unspecified, initial encounter: Secondary | ICD-10-CM | POA: Diagnosis not present

## 2021-09-24 DIAGNOSIS — S299XXA Unspecified injury of thorax, initial encounter: Secondary | ICD-10-CM | POA: Diagnosis present

## 2021-09-24 DIAGNOSIS — I10 Essential (primary) hypertension: Secondary | ICD-10-CM | POA: Insufficient documentation

## 2021-09-24 DIAGNOSIS — I517 Cardiomegaly: Secondary | ICD-10-CM | POA: Diagnosis not present

## 2021-09-24 DIAGNOSIS — S2241XA Multiple fractures of ribs, right side, initial encounter for closed fracture: Secondary | ICD-10-CM | POA: Diagnosis not present

## 2021-09-24 DIAGNOSIS — I700 Atherosclerosis of aorta: Secondary | ICD-10-CM | POA: Diagnosis not present

## 2021-09-24 DIAGNOSIS — S20211A Contusion of right front wall of thorax, initial encounter: Secondary | ICD-10-CM | POA: Diagnosis not present

## 2021-09-24 DIAGNOSIS — Z043 Encounter for examination and observation following other accident: Secondary | ICD-10-CM | POA: Diagnosis not present

## 2021-09-24 LAB — CBC
HCT: 37.8 % (ref 36.0–46.0)
Hemoglobin: 12.6 g/dL (ref 12.0–15.0)
MCH: 32.1 pg (ref 26.0–34.0)
MCHC: 33.3 g/dL (ref 30.0–36.0)
MCV: 96.4 fL (ref 80.0–100.0)
Platelets: 207 10*3/uL (ref 150–400)
RBC: 3.92 MIL/uL (ref 3.87–5.11)
RDW: 13.8 % (ref 11.5–15.5)
WBC: 7.1 10*3/uL (ref 4.0–10.5)
nRBC: 0 % (ref 0.0–0.2)

## 2021-09-24 LAB — BASIC METABOLIC PANEL
Anion gap: 6 (ref 5–15)
BUN: 21 mg/dL (ref 8–23)
CO2: 28 mmol/L (ref 22–32)
Calcium: 8.8 mg/dL — ABNORMAL LOW (ref 8.9–10.3)
Chloride: 98 mmol/L (ref 98–111)
Creatinine, Ser: 0.8 mg/dL (ref 0.44–1.00)
GFR, Estimated: >60 mL/min (ref >60)
Glucose, Bld: 232 mg/dL — ABNORMAL HIGH (ref 70–99)
Potassium: 3.6 mmol/L (ref 3.5–5.1)
Sodium: 132 mmol/L — ABNORMAL LOW (ref 135–145)

## 2021-09-24 MED ORDER — MORPHINE SULFATE (PF) 4 MG/ML IV SOLN
4.0000 mg | Freq: Once | INTRAVENOUS | Status: AC
  Administered 2021-09-24: 4 mg via INTRAVENOUS
  Filled 2021-09-24: qty 1

## 2021-09-24 MED ORDER — HYDROCODONE-ACETAMINOPHEN 5-325 MG PO TABS: 1.0000 | ORAL_TABLET | Freq: Four times a day (QID) | ORAL | 0 refills | Status: DC | PRN

## 2021-09-24 NOTE — ED Notes (Signed)
Patient given discharge instructions, all questions answered. Patient in possession of all belongings, directed to the discharge area  

## 2021-09-24 NOTE — Patient Instructions (Signed)
Instructed patient on how to use an IS properly. Patient demonstrated poorly due to having narcotics. Patient friend acknowledged that she would help with patient.

## 2021-09-24 NOTE — ED Notes (Signed)
Pt ambulated well with walker around nurses station. No complaints from Pt. She does walk fast and I told her she may need to slow down a bit and also find a walker that works best for her since she's using someone else's.

## 2021-09-24 NOTE — ED Provider Notes (Signed)
[facility] Provider Note   CSN: [id number] Arrival date & time: 09/24/21  1705     History  Chief Complaint  Patient presents with   Crystal Huber    Crystal Huber is a 76 y.o. female.   Fall  Patient has history of MS, hypertension, glaucoma, arthritis, hyperlipidemia, GERD Patient presents ED for evaluation after a fall.  Patient states she went to stand up rapidly when the phone rang.  Patient states she has some issues chronically with her balance and vertigo.  Patient feels like she got up too fast and she ended up falling landed on her right side.  Since that time patient's had persistent pain in her right ribs.  She also has some soreness in her right knee.  She bruised her nose but denies any headache or loss of consciousness.  She is not having any swelling or significant discomfort around her nose.    Home Medications Prior to Admission medications   Medication Sig Start Date End Date Taking? Authorizing Provider  HYDROcodone-acetaminophen (NORCO/VICODIN) 5-325 MG tablet Take 1 tablet by mouth every 6 (six) hours as needed. 09/24/21  Yes Dorie Rank, MD  Biotin w/ Vitamins C & E (HAIR/SKIN/NAILS PO) Take 1 tablet by mouth daily.    [provider]  citalopram (CELEXA) 20 MG tablet Take 1.5 tablets (30 mg total) by mouth daily. 04/27/21   [name], NP  gabapentin (NEURONTIN) 300 MG capsule Take 1 or 2 pills p.o. 3 times a day (up to 5 a day) 08/13/21   Sater, Nanine Means, MD  hydrALAZINE (APRESOLINE) 50 MG tablet TAKE ONE TABLET BY MOUTH TWICE DAILY 04/09/21   [name], NP  lactobacillus acidophilus (BACID) TABS tablet Take 1 tablet by mouth daily.    [provider]  lamoTRIgine (LAMICTAL) 150 MG tablet Take 1 tablet (150 mg total) by mouth 3 (three) times daily. 06/14/21   Sater, Nanine Means, MD  latanoprost (XALATAN) 0.005 % ophthalmic solution Place 1 drop into both eyes at bedtime.    [provider]  losartan  (COZAAR) 100 MG tablet TAKE ONE TABLET BY MOUTH ONE TIME DAILY 06/05/21   [name], NP  meclizine (ANTIVERT) 25 MG tablet Take 25 mg by mouth as needed. 08/04/15   [provider]  metoprolol succinate (TOPROL-XL) 50 MG 24 hr tablet TAKE ONE TABLET BY MOUTH ONE TIME DAILY 09/03/21   [name], NP  Multiple Vitamin (MULTIVITAMIN) tablet Take 1 tablet by mouth daily.    [provider]  timolol (TIMOPTIC) 0.5 % ophthalmic solution Place 1 drop into both eyes daily. am 03/21/20   [provider]      Allergies    Carbamazepine, Imipramine, Metronidazole, Oxcarbazepine, Penicillins, Statins, and Sulfa antibiotics    Review of Systems   Review of Systems  Physical Exam Updated Vital Signs BP 140/89   Pulse (!) 49   Temp 97.8 F (36.6 C) (Oral)   Resp 19   Ht 1.6 m ('5\' 3"'$ )   Wt 59.9 kg   SpO2 99%   BMI 23.38 kg/m  Physical Exam Vitals and nursing note reviewed.  Constitutional:      General: She is not in acute distress.    Appearance: She is well-developed.  HENT:     Head: Normocephalic.     Comments: Faint bruising noted bridge of nose, no edema, no deformity, no significant tenderness    Right Ear: External ear normal.  Left Ear: External ear normal.  Eyes:     General: No scleral icterus.       Right eye: No discharge.        Left eye: No discharge.     Conjunctiva/sclera: Conjunctivae normal.  Neck:     Trachea: No tracheal deviation.  Cardiovascular:     Rate and Rhythm: Normal rate and regular rhythm.  Pulmonary:     Effort: Pulmonary effort is normal. No respiratory distress.     Breath sounds: Normal breath sounds. No stridor. No wheezing or rales.  Chest:     Chest wall: Tenderness present.     Comments: Palpation right sided chest wall Abdominal:     General: Bowel sounds are normal. There is no distension.     Palpations: Abdomen is soft.     Tenderness: There is no abdominal tenderness. There is no guarding or  rebound.  Musculoskeletal:        General: No tenderness or deformity.     Cervical back: Neck supple.     Comments: No cervical thoracic or lumbar spine tenderness, mild tenderness right knee  Skin:    General: Skin is warm and dry.     Findings: No rash.  Neurological:     General: No focal deficit present.     Mental Status: She is alert.     Cranial Nerves: No cranial nerve deficit (no facial droop, extraocular movements intact, no slurred speech).     Sensory: No sensory deficit.     Motor: No abnormal muscle tone or seizure activity.     Coordination: Coordination normal.  Psychiatric:        Mood and Affect: Mood normal.     ED Results / Procedures / Treatments   Labs (all labs ordered are listed, but only abnormal results are displayed) Labs Reviewed  BASIC METABOLIC PANEL - Abnormal; Notable for the following components:      Result Value   Sodium 132 (*)    Glucose, Bld 232 (*)    Calcium 8.8 (*)    All other components within normal limits  CBC    EKG None  Radiology DG Knee Complete 4 Views Right  Result Date: 09/24/2021 CLINICAL DATA:  Fall with rib pain. EXAM: RIGHT KNEE - COMPLETE 4+ VIEW COMPARISON:  None Available. FINDINGS: No evidence of fracture, dislocation, or joint effusion. No evidence of arthropathy or other focal bone abnormality. Soft tissues are unremarkable. IMPRESSION: Negative. Electronically Signed   By: Ronney Asters M.D.   On: 09/24/2021 18:02   DG Ribs Unilateral W/Chest Right  Result Date: 09/24/2021 CLINICAL DATA:  Fall, right rib pain EXAM: RIGHT RIBS AND CHEST - 3+ VIEW COMPARISON:  07/29/2021 FINDINGS: Multiple acute mildly displaced right-sided rib fractures involving the lateral right third through ninth ribs. The fourth and fifth rib fractures were present on the previous exam. The right lung is clear. No pneumothorax. Mild cardiomegaly. Aortic atherosclerosis. Multiple surgical clips overlie the chest wall. IMPRESSION: Multiple  acute-appearing mildly displaced right-sided rib fractures involving the lateral right third through ninth ribs. No pneumothorax. Electronically Signed   By: Davina Poke D.O.   On: 09/24/2021 17:58    Procedures Procedures    Medications Ordered in ED Medications  morphine (PF) 4 MG/ML injection 4 mg (4 mg Intravenous Given 09/24/21 2021)    ED Course/ Medical Decision Making/ A&P Clinical Course as of 09/24/21 2219  Mon Sep 24, 2021  0623 Basic metabolic panel(!) Blood sugar elevated [  JK]  2103 CBC Normal [JK]    Clinical Course User Index [JK] Dorie Rank, MD                           Medical Decision Making Problems Addressed: Closed fracture of multiple ribs of right side, initial encounter: acute illness or injury that poses a threat to life or bodily functions Contusion of right knee, initial encounter: acute illness or injury  Amount and/or Complexity of Data Reviewed Labs: ordered. Decision-making details documented in ED Course. Radiology: ordered and independent interpretation performed.  Risk Prescription drug management.   Patient presented to the ED for evaluation of rib pain and knee pain after fall.  Patient denies any loss of consciousness.  No shortness of breath.  Patient noted to have multiple rib fractures on the right side.  No signs of pneumothorax.  On exam no signs of flail pathology.  Patient breathing easily in the ED.  I explained my concerns to her about her multiple rib fractures.  She is at risk for significant breathing  difficulty and worsening symptoms.  I recommended admission to the hospital.  Patient states she was feeling well enough to go home and did not want to be admitted.  She does have help at home.  Patient was given a dose of pain medications.  She was able to ambulate in the ED.  She does feel like she can manage.  Will discharge home with prescription for pain medications.  Close outpatient follow-up.  Patient instructed to return  immediately if she has any worsening symptoms        Final Clinical Impression(s) / ED Diagnoses Final diagnoses:  Closed fracture of multiple ribs of right side, initial encounter  Contusion of right knee, initial encounter    Rx / DC Orders ED Discharge Orders          Ordered    HYDROcodone-acetaminophen (NORCO/VICODIN) 5-325 MG tablet  Every 6 hours PRN        09/24/21 2216              Dorie Rank, MD 09/24/21 2219

## 2021-09-24 NOTE — Discharge Instructions (Signed)
As we discussed the x-ray showed multiple rib fractures on your right side.  Return to the hospital if you change your mind about being admitted or have any worsening symptoms.  Return immediately for trouble with your breathing fevers or other concerning symptoms

## 2021-09-24 NOTE — ED Triage Notes (Signed)
States fell today, c/o right rib & right knee pain. Hx of fractured ribs on right side. Bruising to bridge of nose. Denies thinners or LOC.

## 2021-09-27 ENCOUNTER — Emergency Department (HOSPITAL_BASED_OUTPATIENT_CLINIC_OR_DEPARTMENT_OTHER): Payer: Medicare Other

## 2021-09-27 ENCOUNTER — Other Ambulatory Visit: Payer: Self-pay

## 2021-09-27 ENCOUNTER — Inpatient Hospital Stay (HOSPITAL_BASED_OUTPATIENT_CLINIC_OR_DEPARTMENT_OTHER)
Admission: EM | Admit: 2021-09-27 | Discharge: 2021-10-06 | DRG: 563 | Disposition: A | Discharge status: Skilled Nursing Facility | Payer: Medicare Other | Attending: Family Medicine | Admitting: Family Medicine

## 2021-09-27 ENCOUNTER — Encounter (HOSPITAL_BASED_OUTPATIENT_CLINIC_OR_DEPARTMENT_OTHER): Payer: Self-pay

## 2021-09-27 DIAGNOSIS — S82001D Unspecified fracture of right patella, subsequent encounter for closed fracture with routine healing: Secondary | ICD-10-CM | POA: Diagnosis not present

## 2021-09-27 DIAGNOSIS — K219 Gastro-esophageal reflux disease without esophagitis: Secondary | ICD-10-CM | POA: Diagnosis present

## 2021-09-27 DIAGNOSIS — M852 Hyperostosis of skull: Secondary | ICD-10-CM | POA: Diagnosis not present

## 2021-09-27 DIAGNOSIS — E871 Hypo-osmolality and hyponatremia: Secondary | ICD-10-CM | POA: Diagnosis present

## 2021-09-27 DIAGNOSIS — I672 Cerebral atherosclerosis: Secondary | ICD-10-CM | POA: Diagnosis not present

## 2021-09-27 DIAGNOSIS — Z4789 Encounter for other orthopedic aftercare: Secondary | ICD-10-CM | POA: Diagnosis not present

## 2021-09-27 DIAGNOSIS — M25561 Pain in right knee: Secondary | ICD-10-CM | POA: Diagnosis not present

## 2021-09-27 DIAGNOSIS — Z90711 Acquired absence of uterus with remaining cervical stump: Secondary | ICD-10-CM

## 2021-09-27 DIAGNOSIS — Z743 Need for continuous supervision: Secondary | ICD-10-CM | POA: Diagnosis not present

## 2021-09-27 DIAGNOSIS — F413 Other mixed anxiety disorders: Secondary | ICD-10-CM | POA: Diagnosis not present

## 2021-09-27 DIAGNOSIS — S2241XA Multiple fractures of ribs, right side, initial encounter for closed fracture: Secondary | ICD-10-CM | POA: Diagnosis not present

## 2021-09-27 DIAGNOSIS — G35 Multiple sclerosis: Secondary | ICD-10-CM | POA: Diagnosis present

## 2021-09-27 DIAGNOSIS — N179 Acute kidney failure, unspecified: Secondary | ICD-10-CM | POA: Diagnosis not present

## 2021-09-27 DIAGNOSIS — S2249XA Multiple fractures of ribs, unspecified side, initial encounter for closed fracture: Secondary | ICD-10-CM | POA: Diagnosis not present

## 2021-09-27 DIAGNOSIS — M25461 Effusion, right knee: Secondary | ICD-10-CM | POA: Diagnosis not present

## 2021-09-27 DIAGNOSIS — Z8249 Family history of ischemic heart disease and other diseases of the circulatory system: Secondary | ICD-10-CM | POA: Diagnosis not present

## 2021-09-27 DIAGNOSIS — W19XXXA Unspecified fall, initial encounter: Secondary | ICD-10-CM | POA: Diagnosis present

## 2021-09-27 DIAGNOSIS — N3000 Acute cystitis without hematuria: Secondary | ICD-10-CM | POA: Diagnosis not present

## 2021-09-27 DIAGNOSIS — Z882 Allergy status to sulfonamides status: Secondary | ICD-10-CM | POA: Diagnosis not present

## 2021-09-27 DIAGNOSIS — R42 Dizziness and giddiness: Secondary | ICD-10-CM

## 2021-09-27 DIAGNOSIS — K449 Diaphragmatic hernia without obstruction or gangrene: Secondary | ICD-10-CM | POA: Diagnosis not present

## 2021-09-27 DIAGNOSIS — S4991XA Unspecified injury of right shoulder and upper arm, initial encounter: Secondary | ICD-10-CM | POA: Diagnosis not present

## 2021-09-27 DIAGNOSIS — M4312 Spondylolisthesis, cervical region: Secondary | ICD-10-CM | POA: Diagnosis not present

## 2021-09-27 DIAGNOSIS — Z853 Personal history of malignant neoplasm of breast: Secondary | ICD-10-CM | POA: Diagnosis not present

## 2021-09-27 DIAGNOSIS — D649 Anemia, unspecified: Secondary | ICD-10-CM | POA: Diagnosis present

## 2021-09-27 DIAGNOSIS — S82144A Nondisplaced bicondylar fracture of right tibia, initial encounter for closed fracture: Secondary | ICD-10-CM | POA: Diagnosis not present

## 2021-09-27 DIAGNOSIS — M19011 Primary osteoarthritis, right shoulder: Secondary | ICD-10-CM | POA: Diagnosis not present

## 2021-09-27 DIAGNOSIS — E875 Hyperkalemia: Secondary | ICD-10-CM | POA: Diagnosis not present

## 2021-09-27 DIAGNOSIS — M810 Age-related osteoporosis without current pathological fracture: Secondary | ICD-10-CM | POA: Diagnosis not present

## 2021-09-27 DIAGNOSIS — I10 Essential (primary) hypertension: Secondary | ICD-10-CM | POA: Diagnosis present

## 2021-09-27 DIAGNOSIS — Z880 Allergy status to penicillin: Secondary | ICD-10-CM | POA: Diagnosis not present

## 2021-09-27 DIAGNOSIS — G500 Trigeminal neuralgia: Secondary | ICD-10-CM | POA: Diagnosis not present

## 2021-09-27 DIAGNOSIS — E8809 Other disorders of plasma-protein metabolism, not elsewhere classified: Secondary | ICD-10-CM | POA: Diagnosis not present

## 2021-09-27 DIAGNOSIS — S82091A Other fracture of right patella, initial encounter for closed fracture: Secondary | ICD-10-CM | POA: Diagnosis not present

## 2021-09-27 DIAGNOSIS — I2510 Atherosclerotic heart disease of native coronary artery without angina pectoris: Secondary | ICD-10-CM | POA: Diagnosis not present

## 2021-09-27 DIAGNOSIS — Z888 Allergy status to other drugs, medicaments and biological substances status: Secondary | ICD-10-CM | POA: Diagnosis not present

## 2021-09-27 DIAGNOSIS — I700 Atherosclerosis of aorta: Secondary | ICD-10-CM | POA: Diagnosis not present

## 2021-09-27 DIAGNOSIS — N3289 Other specified disorders of bladder: Secondary | ICD-10-CM | POA: Diagnosis not present

## 2021-09-27 DIAGNOSIS — H409 Unspecified glaucoma: Secondary | ICD-10-CM | POA: Diagnosis present

## 2021-09-27 DIAGNOSIS — E785 Hyperlipidemia, unspecified: Secondary | ICD-10-CM | POA: Diagnosis present

## 2021-09-27 DIAGNOSIS — Z79899 Other long term (current) drug therapy: Secondary | ICD-10-CM | POA: Diagnosis not present

## 2021-09-27 DIAGNOSIS — R269 Unspecified abnormalities of gait and mobility: Secondary | ICD-10-CM | POA: Diagnosis not present

## 2021-09-27 DIAGNOSIS — S82001A Unspecified fracture of right patella, initial encounter for closed fracture: Secondary | ICD-10-CM | POA: Diagnosis not present

## 2021-09-27 DIAGNOSIS — S82101A Unspecified fracture of upper end of right tibia, initial encounter for closed fracture: Secondary | ICD-10-CM | POA: Diagnosis not present

## 2021-09-27 DIAGNOSIS — Z87891 Personal history of nicotine dependence: Secondary | ICD-10-CM | POA: Diagnosis not present

## 2021-09-27 DIAGNOSIS — R531 Weakness: Secondary | ICD-10-CM | POA: Diagnosis not present

## 2021-09-27 DIAGNOSIS — R296 Repeated falls: Secondary | ICD-10-CM | POA: Diagnosis present

## 2021-09-27 DIAGNOSIS — Z789 Other specified health status: Secondary | ICD-10-CM

## 2021-09-27 DIAGNOSIS — S82209A Unspecified fracture of shaft of unspecified tibia, initial encounter for closed fracture: Secondary | ICD-10-CM | POA: Diagnosis present

## 2021-09-27 DIAGNOSIS — E7800 Pure hypercholesterolemia, unspecified: Secondary | ICD-10-CM | POA: Diagnosis present

## 2021-09-27 DIAGNOSIS — S0093XA Contusion of unspecified part of head, initial encounter: Secondary | ICD-10-CM | POA: Diagnosis not present

## 2021-09-27 DIAGNOSIS — F419 Anxiety disorder, unspecified: Secondary | ICD-10-CM | POA: Diagnosis present

## 2021-09-27 DIAGNOSIS — G35D Multiple sclerosis, unspecified: Secondary | ICD-10-CM | POA: Diagnosis present

## 2021-09-27 DIAGNOSIS — R261 Paralytic gait: Secondary | ICD-10-CM | POA: Diagnosis not present

## 2021-09-27 DIAGNOSIS — S3991XA Unspecified injury of abdomen, initial encounter: Secondary | ICD-10-CM | POA: Diagnosis not present

## 2021-09-27 DIAGNOSIS — S2241XD Multiple fractures of ribs, right side, subsequent encounter for fracture with routine healing: Secondary | ICD-10-CM | POA: Diagnosis not present

## 2021-09-27 DIAGNOSIS — S82034A Nondisplaced transverse fracture of right patella, initial encounter for closed fracture: Secondary | ICD-10-CM | POA: Diagnosis not present

## 2021-09-27 HISTORY — DX: Trigeminal neuralgia: G50.0

## 2021-09-27 LAB — CBC WITH DIFFERENTIAL/PLATELET
Abs Immature Granulocytes: 0.01 10*3/uL (ref 0.00–0.07)
Basophils Absolute: 0.1 10*3/uL (ref 0.0–0.1)
Basophils Relative: 1 %
Eosinophils Absolute: 0.2 10*3/uL (ref 0.0–0.5)
Eosinophils Relative: 4 %
HCT: 37.3 % (ref 36.0–46.0)
Hemoglobin: 12.3 g/dL (ref 12.0–15.0)
Immature Granulocytes: 0 %
Lymphocytes Relative: 20 %
Lymphs Abs: 1 10*3/uL (ref 0.7–4.0)
MCH: 31.9 pg (ref 26.0–34.0)
MCHC: 33 g/dL (ref 30.0–36.0)
MCV: 96.6 fL (ref 80.0–100.0)
Monocytes Absolute: 0.8 10*3/uL (ref 0.1–1.0)
Monocytes Relative: 16 %
Neutro Abs: 3.1 10*3/uL (ref 1.7–7.7)
Neutrophils Relative %: 59 %
Platelets: 248 10*3/uL (ref 150–400)
RBC: 3.86 MIL/uL — ABNORMAL LOW (ref 3.87–5.11)
RDW: 13.9 % (ref 11.5–15.5)
WBC: 5.2 10*3/uL (ref 4.0–10.5)
nRBC: 0 % (ref 0.0–0.2)

## 2021-09-27 LAB — BASIC METABOLIC PANEL
Anion gap: 7 (ref 5–15)
BUN: 18 mg/dL (ref 8–23)
CO2: 26 mmol/L (ref 22–32)
Calcium: 9.1 mg/dL (ref 8.9–10.3)
Chloride: 102 mmol/L (ref 98–111)
Creatinine, Ser: 0.62 mg/dL (ref 0.44–1.00)
GFR, Estimated: >60 mL/min (ref >60)
Glucose, Bld: 82 mg/dL (ref 70–99)
Potassium: 4.5 mmol/L (ref 3.5–5.1)
Sodium: 135 mmol/L (ref 135–145)

## 2021-09-27 LAB — URINALYSIS, MICROSCOPIC (REFLEX)

## 2021-09-27 LAB — LIPASE, BLOOD: Lipase: 26 U/L (ref 11–51)

## 2021-09-27 LAB — HEPATIC FUNCTION PANEL
ALT: 18 U/L (ref 0–44)
AST: 23 U/L (ref 15–41)
Albumin: 4 g/dL (ref 3.5–5.0)
Alkaline Phosphatase: 93 U/L (ref 38–126)
Bilirubin, Direct: 0.1 mg/dL (ref 0.0–0.2)
Indirect Bilirubin: 0.8 mg/dL (ref 0.3–0.9)
Total Bilirubin: 0.9 mg/dL (ref 0.3–1.2)
Total Protein: 6.8 g/dL (ref 6.5–8.1)

## 2021-09-27 LAB — URINALYSIS, ROUTINE W REFLEX MICROSCOPIC
Bilirubin Urine: NEGATIVE
Glucose, UA: NEGATIVE mg/dL
Ketones, ur: NEGATIVE mg/dL
Nitrite: NEGATIVE
Protein, ur: NEGATIVE mg/dL
Specific Gravity, Urine: 1.015 (ref 1.005–1.030)
pH: 6.5 (ref 5.0–8.0)

## 2021-09-27 LAB — PROTIME-INR
INR: 1 (ref 0.8–1.2)
Prothrombin Time: 12.9 seconds (ref 11.4–15.2)

## 2021-09-27 MED ORDER — IOHEXOL 300 MG/ML  SOLN
100.0000 mL | Freq: Once | INTRAMUSCULAR | Status: AC | PRN
  Administered 2021-09-27: 100 mL via INTRAVENOUS

## 2021-09-27 MED ORDER — SODIUM CHLORIDE 0.9 % IV SOLN
1.0000 g | Freq: Once | INTRAVENOUS | Status: AC
  Administered 2021-09-27: 1 g via INTRAVENOUS
  Filled 2021-09-27: qty 10

## 2021-09-27 NOTE — ED Triage Notes (Signed)
States has been having more frequent falls for the past month. States fell Tuesday and has been getting more bruising. Bruises noted to bilateral arms. Denies recent head injury during fall, last head injury a week ago. Seen here on 7/10. Denies thinner use.

## 2021-09-27 NOTE — ED Notes (Signed)
Patient transported to X-ray 

## 2021-09-27 NOTE — ED Notes (Signed)
Lab notified of urine culture

## 2021-09-27 NOTE — ED Provider Notes (Signed)
[facility] EMERGENCY DEPARTMENT Provider Note   CSN: [id number] Arrival date & time: 09/27/21  1051     History  Chief Complaint  Patient presents with   Fall   Dizziness    Crystal Huber is a 76 y.o. female.  The history is provided by the patient and medical records. No language interpreter was used.  Fall This is a recurrent problem. The current episode started 2 days ago. The problem occurs constantly. Associated symptoms include chest pain, abdominal pain, headaches and shortness of breath. Nothing aggravates the symptoms. Nothing relieves the symptoms. She has tried nothing for the symptoms. The treatment provided no relief.  Dizziness Associated symptoms: chest pain, headaches and shortness of breath   Associated symptoms: no diarrhea, no nausea, no palpitations, no vomiting and no weakness        Home Medications Prior to Admission medications   Medication Sig Start Date End Date Taking? Authorizing Provider  Biotin w/ Vitamins C & E (HAIR/SKIN/NAILS PO) Take 1 tablet by mouth daily.    [provider]  citalopram (CELEXA) 20 MG tablet Take 1.5 tablets (30 mg total) by mouth daily. 04/27/21   [name], NP  gabapentin (NEURONTIN) 300 MG capsule Take 1 or 2 pills p.o. 3 times a day (up to 5 a day) 08/13/21   Sater, Nanine Means, MD  hydrALAZINE (APRESOLINE) 50 MG tablet TAKE ONE TABLET BY MOUTH TWICE DAILY 04/09/21   [name], NP  HYDROcodone-acetaminophen (NORCO/VICODIN) 5-325 MG tablet Take 1 tablet by mouth every 6 (six) hours as needed. 09/24/21   Dorie Rank, MD  lactobacillus acidophilus (BACID) TABS tablet Take 1 tablet by mouth daily.    [provider]  lamoTRIgine (LAMICTAL) 150 MG tablet Take 1 tablet (150 mg total) by mouth 3 (three) times daily. 06/14/21   Sater, Nanine Means, MD  latanoprost (XALATAN) 0.005 % ophthalmic solution Place 1 drop into both eyes at bedtime.    [provider]  losartan (COZAAR) 100 MG  tablet TAKE ONE TABLET BY MOUTH ONE TIME DAILY 06/05/21   [name], NP  meclizine (ANTIVERT) 25 MG tablet Take 25 mg by mouth as needed. 08/04/15   [provider]  metoprolol succinate (TOPROL-XL) 50 MG 24 hr tablet TAKE ONE TABLET BY MOUTH ONE TIME DAILY 09/03/21   [name], NP  Multiple Vitamin (MULTIVITAMIN) tablet Take 1 tablet by mouth daily.    [provider]  timolol (TIMOPTIC) 0.5 % ophthalmic solution Place 1 drop into both eyes daily. am 03/21/20   [provider]      Allergies    Carbamazepine, Imipramine, Metronidazole, Oxcarbazepine, Penicillins, Statins, and Sulfa antibiotics    Review of Systems   Review of Systems  Constitutional:  Positive for fatigue. Negative for chills and diaphoresis.  HENT:  Negative for congestion.   Eyes:  Negative for visual disturbance.  Respiratory:  Positive for shortness of breath. Negative for cough, chest tightness and wheezing.   Cardiovascular:  Positive for chest pain. Negative for palpitations and leg swelling.  Gastrointestinal:  Positive for abdominal pain. Negative for constipation, diarrhea, nausea and vomiting.  Genitourinary:  Positive for dysuria. Negative for flank pain.  Musculoskeletal:  Positive for back pain and neck pain. Negative for neck stiffness.  Skin:  Positive for color change. Negative for rash and wound.  Neurological:  Positive for dizziness and headaches. Negative for facial asymmetry, weakness, light-headedness and numbness.  Psychiatric/Behavioral:  Negative for agitation and  confusion.   All other systems reviewed and are negative.   Physical Exam Updated Vital Signs BP (!) 140/116 (BP Location: Left Arm)   Pulse (!) 54   Temp 98.3 F (36.8 C) (Oral)   Resp 18   Ht '5\' 3"'$  (1.6 m)   Wt 59.9 kg   SpO2 99%   BMI 23.38 kg/m  Physical Exam Vitals and nursing note reviewed.  Constitutional:      General: She is not in acute distress.    Appearance: She is  well-developed. She is not ill-appearing, toxic-appearing or diaphoretic.  HENT:     Head: Normocephalic and atraumatic.     Nose: No congestion or rhinorrhea.     Mouth/Throat:     Mouth: Mucous membranes are moist.     Pharynx: No oropharyngeal exudate or posterior oropharyngeal erythema.  Eyes:     Extraocular Movements: Extraocular movements intact.     Conjunctiva/sclera: Conjunctivae normal.     Pupils: Pupils are equal, round, and reactive to light.  Cardiovascular:     Rate and Rhythm: Normal rate and regular rhythm.     Heart sounds: No murmur heard. Pulmonary:     Effort: Pulmonary effort is normal. No respiratory distress.     Breath sounds: Normal breath sounds. No wheezing, rhonchi or rales.  Chest:     Chest wall: Tenderness present.  Abdominal:     General: Abdomen is flat.     Palpations: Abdomen is soft.     Tenderness: There is abdominal tenderness. There is no right CVA tenderness, left CVA tenderness, guarding or rebound.  Musculoskeletal:        General: Tenderness and signs of injury present. No swelling.     Cervical back: Neck supple. Tenderness present.     Right lower leg: No edema.     Left lower leg: No edema.  Skin:    General: Skin is warm and dry.     Capillary Refill: Capillary refill takes less than 2 seconds.     Findings: Bruising present.  Neurological:     Mental Status: She is alert.     Sensory: No sensory deficit.     Motor: No weakness.  Psychiatric:        Mood and Affect: Mood normal.     ED Results / Procedures / Treatments   Labs (all labs ordered are listed, but only abnormal results are displayed) Labs Reviewed  CBC WITH DIFFERENTIAL/PLATELET - Abnormal; Notable for the following components:      Result Value   RBC 3.86 (*)    All other components within normal limits  URINALYSIS, ROUTINE W REFLEX MICROSCOPIC - Abnormal; Notable for the following components:   Hgb urine dipstick TRACE (*)    Leukocytes,Ua MODERATE (*)     All other components within normal limits  URINALYSIS, MICROSCOPIC (REFLEX) - Abnormal; Notable for the following components:   Bacteria, UA RARE (*)    All other components within normal limits  URINE CULTURE  BASIC METABOLIC PANEL  HEPATIC FUNCTION PANEL  PROTIME-INR  LIPASE, BLOOD    EKG None  Radiology CT Cervical Spine Wo Contrast  Result Date: 09/27/2021 CLINICAL DATA:  Trauma, fall EXAM: CT CERVICAL SPINE WITHOUT CONTRAST TECHNIQUE: Multidetector CT imaging of the cervical spine was performed without intravenous contrast. Multiplanar CT image reconstructions were also generated. RADIATION DOSE REDUCTION: This exam was performed according to the departmental dose-optimization program which includes automated exposure control, adjustment of the mA and/or kV according to  patient size and/or use of iterative reconstruction technique. COMPARISON:  07/29/2021 FINDINGS: Alignment: There is minimal anterolisthesis at C4-C5 and C7-T1 levels suggesting previous ligamentous injury. Alignment of the posterior margins of vertebral bodies is otherwise unremarkable. Skull base and vertebrae: No recent fracture is seen. There is a 9 mm smoothly marginated calcification posterior to the spinous processes of C4 and C5 vertebrae suggesting old ligamentous injury. There is possible old tiny avulsion fracture at the tip of spinous process of C7 vertebra with no significant interval change. Soft tissues and spinal canal: There is no central spinal stenosis. Disc levels: There is encroachment of neural foramina at C5-C6 and C6-C7 levels more so on the right side at C6-C7 level. Upper chest: Unremarkable. Other: None. IMPRESSION: No recent fracture is seen in cervical spine. Cervical spondylosis with encroachment of neural foramina at C5-C6 and C6-C7 levels. No significant interval changes are noted. Electronically Signed   By: Elmer Picker M.D.   On: 09/27/2021 14:08   DG Knee Complete 4 Views  Right  Result Date: 09/27/2021 CLINICAL DATA:  Trauma EXAM: RIGHT KNEE - COMPLETE 4+ VIEW COMPARISON:  Knee radiograph 09/24/2021 FINDINGS: There is a nondisplaced fracture of the inferior patella. Trace joint effusion. Additional possible nondisplaced fracture of the medial tibial plateau which is potentially extra-articular. IMPRESSION: Nondisplaced inferior patellar fracture. Possible nondisplaced extra-articular medial tibial plateau fracture. Consider CT. Electronically Signed   By: Maurine Simmering M.D.   On: 09/27/2021 13:39   DG Shoulder Right  Result Date: 09/27/2021 CLINICAL DATA:  Trauma, fall EXAM: RIGHT SHOULDER - 2+ VIEW COMPARISON:  None Available. FINDINGS: No displaced fracture or dislocation is seen. Degenerative changes are noted in the [facility] joint. Surgical clips are seen in right axilla. There is displaced fracture in the posterolateral aspect of right seventh rib. There may be other undisplaced fractures in the adjacent ribs. IMPRESSION: No fracture or dislocation is seen in right shoulder. There is displaced fracture in the posterolateral aspect of right seventh rib, possibly recent. Electronically Signed   By: Elmer Picker M.D.   On: 09/27/2021 13:36   CT Head Wo Contrast  Result Date: 09/27/2021 CLINICAL DATA:  Multiple falls. EXAM: CT HEAD WITHOUT CONTRAST TECHNIQUE: Contiguous axial images were obtained from the base of the skull through the vertex without intravenous contrast. RADIATION DOSE REDUCTION: This exam was performed according to the departmental dose-optimization program which includes automated exposure control, adjustment of the mA and/or kV according to patient size and/or use of iterative reconstruction technique. COMPARISON:  Head CT Jul 29, 2021 FINDINGS: Brain: Similar periventricular hypodensities with the largest hypodensity along the anterior right lateral ventricle consistent with known history of multiple. No new focal hypodensities identified. Evidence of  acute large vascular territory infarction, hemorrhage, hydrocephalus, extra-axial collection or mass lesion/mass effect. Vascular: No hyperdense vessel. Atherosclerotic calcifications of the internal and vertebral arteries at the skull base. Skull: Small extra calvarial hematoma overlying the right vertex on coronal image 89/4. Hyperostosis interna. Negative for fracture or focal lesion. Sinuses/Orbits: Visualized portions of the paranasal sinuses are predominantly clear. Orbits are grossly unremarkable. Other: Mastoid air cells are predominantly clear. IMPRESSION: 1. No acute intracranial findings. 2. Similar white matter hypodensities compatible with known history of multiple sclerosis. Electronically Signed   By: Dahlia Bailiff M.D.   On: 09/27/2021 11:45    Procedures Procedures    Medications Ordered in ED Medications  iohexol (OMNIPAQUE) 300 MG/ML solution 100 mL (100 mLs Intravenous Contrast Given 09/27/21 1314)  cefTRIAXone (ROCEPHIN)  1 g in sodium chloride 0.9 % 100 mL IVPB (1 g Intravenous New Bag/Given 09/27/21 1510)    ED Course/ Medical Decision Making/ A&P                           Medical Decision Making Amount and/or Complexity of Data Reviewed Labs: ordered. Radiology: ordered.  Risk Prescription drug management. Decision regarding hospitalization.    JOSLYNE MARSHBURN is a 76 y.o. female with a past medical history significant for hypertension, hyperlipidemia, GERD, previous breast cancer, multiple sclerosis, and recent falls who presents with recurrent fall and bruising.  According to patient, over the last few weeks she has been more falls and says she has had approximately 6 falls in the last week and a half.  She reports she was seen 3 days ago in the emergency department and was told she had possibly 5 new rib fractures on her right chest after falling.  She maintained her oxygen saturations and wanted to be discharged so she was discharged home however since then she has had  another fall.  She says that she gets lightheaded and unsteady at times and feels that she is not walking like she normally did a month or 2 ago.  She reports that she is having more pain in her right chest, right abdomen, left flank, right shoulder, right knee, and neck.  She denies loss of consciousness.  She denies any fevers, chills, nausea, vomiting, constipation, or diarrhea.  She does report she had some dysuria recently.  She denies other complaints at this time.  On exam, lungs were clear but chest was tender on her right chest wall.  Also tender on her right back and right abdomen.  She is tender in her bilateral paraspinal neck.  She has significant bruising in her left flank and some bruising on her left chest.  She also is bruising on her right shoulder but has intact sensation, strength, and pulses in upper and lower extremities.  Patient has normal bowel sounds.  Patient had intact finger-nose-finger testing.  Symmetric smile.  Clear speech.  Pupils are symmetric and reactive with no Movements.  Given the patient's report that she had at least 5 acute rib fractures on x-ray several days ago and had a fall since with worsened pain, I am somewhat concerned about her continued falls.  She had a CT head in triage that did not show acute fracture or bleeding but we will get imaging of her neck and torso including a chest/abdomen/pelvis CT to look for multiple other rib fractures or solid organ injury or bleeding.  We will get screening labs.  Patient says she does not want any MRI that is enclosed however I am somewhat concerned that her stability troubles may be more neurologic in nature as she says that her MS manifested with unsteadiness and difficulty with gait.  She does say that her gait has been worse over the last few months.  After imaging is completed, I do anticipate patient will need admission for the 6 falls in the last 2 weeks including continued falls after recent  discharge.  Anticipate reassessment after work-up to determine disposition.  2:50 PM X-ray of the knee returned to evidence of patellar fracture and possible tibial plateau fracture.  I spoke to Hilbert Odor with orthopedics who recommended knee immobilization and CT scan to confirm fracture pattern.  Patient's work-up also continues to return and the CT of her chest/ab/pelvis shows  evidence of multiple rib fractures including ribs 4, 5, 6, 7, 8, and 9 with the fourth rib having a fracture in 2 places.  There are also old injuries seen.  No evidence of pneumothorax or pneumonia at this time.  Urinalysis returned showing evidence of leukocytes and bacteria, given her dysuria and feeling off I am somewhat concerned that she could have a UTI contributing to some of her instability and falls.  Given her 6 falls in 2 weeks, possible tibial plateau fracture/patella fracture, and 7 ribs with evidence of new fractures with pleuritic chest pain and severe fatigue I am concerned about her going home.    We will call trauma initially given the traumatic injuries to determine if she needs admission to trauma service versus a medicine service for the multiple orthopedic bony injuries, UTI, and her reported worsening instability in the setting of MS.  She will be admitted  Care transferred to oncoming team to await determination of best place for admission for her.        Final Clinical Impression(s) / ED Diagnoses Final diagnoses:  Closed fracture of multiple ribs of right side, initial encounter  Closed nondisplaced fracture of right patella, unspecified fracture morphology, initial encounter  Multiple falls  Acute cystitis without hematuria     Clinical Impression: 1. Closed fracture of multiple ribs of right side, initial encounter   2. Closed nondisplaced fracture of right patella, unspecified fracture morphology, initial encounter   3. Multiple falls   4. Acute cystitis without  hematuria     Disposition: Admit  This note was prepared with assistance of Dragon voice recognition software. Occasional wrong-word or sound-a-like substitutions may have occurred due to the inherent limitations of voice recognition software.     Keith Felten, Gwenyth Allegra, MD 09/27/21 1544

## 2021-09-27 NOTE — ED Notes (Signed)
NT went to update PT VS, PT in Xray

## 2021-09-27 NOTE — ED Notes (Signed)
States urine has been dark and states burning with urination yesterday.

## 2021-09-27 NOTE — ED Provider Notes (Signed)
   3:25 PM Patient signed out to me by previous ED physician. Pt is a 76 yo presenting for rib pain. Pt was seen on 7/10 and diagnosis of rib fractures 3-9 on right with repeat fall after discharge and new rib fractures diagnosed today.  Patient admits to 6 falls in the last week thought to be secondary to UTI.    Plan: Admit to trauma-mulitple rib fractures Tx for UTI with rocephin Ortho on for nondisplaced extra-articular medial tibial plateau Fracture  New CT today demonstrates: Multiple acute or subacute fractures of the right ribs including the right fourth, fifth, sixth, seventh, eighth, and ninth ribs.    Physical Exam  BP (!) 170/76 (BP Location: Right Arm)   Pulse (!) 58   Temp 98.3 F (36.8 C) (Oral)   Resp 18   Ht '5\' 3"'$  (1.6 m)   Wt 59.9 kg   SpO2 99%   BMI 23.38 kg/m   Physical Exam Vitals and nursing note reviewed.  Constitutional:      Appearance: Normal appearance.  Cardiovascular:     Rate and Rhythm: Normal rate and regular rhythm.  Pulmonary:     Effort: Pulmonary effort is normal.  Neurological:     General: No focal deficit present.     Mental Status: She is alert and oriented to person, place, and time.  Psychiatric:        Mood and Affect: Mood normal.     Procedures  Procedures  ED Course / MDM    Medical Decision Making Amount and/or Complexity of Data Reviewed Labs: ordered. Radiology: ordered.  Risk Prescription drug management. Decision regarding hospitalization.   Spoke with trauma surgeon Dr. Grandville Silos who agrees to be consulted on patient service.  Currently with patient's stable vital signs no oxygen requirements, no pneumothorax, no thorax think it is fair that he remains on consult with this and admit.  I spoke with admitting physician Dr. Trilby Drummer greased to accept patient.       Lianne Cure, DO 73/[id number]/29 [id number]

## 2021-09-27 NOTE — ED Notes (Signed)
Patient transported to CT 

## 2021-09-28 ENCOUNTER — Encounter (HOSPITAL_COMMUNITY): Payer: Self-pay | Admitting: Internal Medicine

## 2021-09-28 DIAGNOSIS — W19XXXA Unspecified fall, initial encounter: Secondary | ICD-10-CM | POA: Diagnosis not present

## 2021-09-28 DIAGNOSIS — N3000 Acute cystitis without hematuria: Secondary | ICD-10-CM | POA: Diagnosis not present

## 2021-09-28 DIAGNOSIS — H409 Unspecified glaucoma: Secondary | ICD-10-CM | POA: Diagnosis present

## 2021-09-28 DIAGNOSIS — Z880 Allergy status to penicillin: Secondary | ICD-10-CM | POA: Diagnosis not present

## 2021-09-28 DIAGNOSIS — E785 Hyperlipidemia, unspecified: Secondary | ICD-10-CM | POA: Diagnosis not present

## 2021-09-28 DIAGNOSIS — S2241XA Multiple fractures of ribs, right side, initial encounter for closed fracture: Secondary | ICD-10-CM | POA: Diagnosis not present

## 2021-09-28 DIAGNOSIS — R42 Dizziness and giddiness: Secondary | ICD-10-CM | POA: Diagnosis not present

## 2021-09-28 DIAGNOSIS — D649 Anemia, unspecified: Secondary | ICD-10-CM | POA: Diagnosis not present

## 2021-09-28 DIAGNOSIS — S82001A Unspecified fracture of right patella, initial encounter for closed fracture: Secondary | ICD-10-CM

## 2021-09-28 DIAGNOSIS — E8809 Other disorders of plasma-protein metabolism, not elsewhere classified: Secondary | ICD-10-CM | POA: Diagnosis not present

## 2021-09-28 DIAGNOSIS — Z90711 Acquired absence of uterus with remaining cervical stump: Secondary | ICD-10-CM | POA: Diagnosis not present

## 2021-09-28 DIAGNOSIS — G500 Trigeminal neuralgia: Secondary | ICD-10-CM | POA: Diagnosis not present

## 2021-09-28 DIAGNOSIS — Z888 Allergy status to other drugs, medicaments and biological substances status: Secondary | ICD-10-CM | POA: Diagnosis not present

## 2021-09-28 DIAGNOSIS — Z87891 Personal history of nicotine dependence: Secondary | ICD-10-CM | POA: Diagnosis not present

## 2021-09-28 DIAGNOSIS — Z8249 Family history of ischemic heart disease and other diseases of the circulatory system: Secondary | ICD-10-CM | POA: Diagnosis not present

## 2021-09-28 DIAGNOSIS — I10 Essential (primary) hypertension: Secondary | ICD-10-CM | POA: Diagnosis not present

## 2021-09-28 DIAGNOSIS — R296 Repeated falls: Secondary | ICD-10-CM | POA: Diagnosis not present

## 2021-09-28 DIAGNOSIS — K219 Gastro-esophageal reflux disease without esophagitis: Secondary | ICD-10-CM | POA: Diagnosis not present

## 2021-09-28 DIAGNOSIS — Z882 Allergy status to sulfonamides status: Secondary | ICD-10-CM | POA: Diagnosis not present

## 2021-09-28 DIAGNOSIS — Z853 Personal history of malignant neoplasm of breast: Secondary | ICD-10-CM | POA: Diagnosis not present

## 2021-09-28 DIAGNOSIS — G35 Multiple sclerosis: Secondary | ICD-10-CM | POA: Diagnosis not present

## 2021-09-28 DIAGNOSIS — S82209A Unspecified fracture of shaft of unspecified tibia, initial encounter for closed fracture: Secondary | ICD-10-CM | POA: Diagnosis present

## 2021-09-28 DIAGNOSIS — E871 Hypo-osmolality and hyponatremia: Secondary | ICD-10-CM | POA: Diagnosis not present

## 2021-09-28 DIAGNOSIS — Z79899 Other long term (current) drug therapy: Secondary | ICD-10-CM | POA: Diagnosis not present

## 2021-09-28 DIAGNOSIS — Z789 Other specified health status: Secondary | ICD-10-CM

## 2021-09-28 LAB — CBC
HCT: 39.7 % (ref 36.0–46.0)
Hemoglobin: 13.2 g/dL (ref 12.0–15.0)
MCH: 31.8 pg (ref 26.0–34.0)
MCHC: 33.2 g/dL (ref 30.0–36.0)
MCV: 95.7 fL (ref 80.0–100.0)
Platelets: 240 10*3/uL (ref 150–400)
RBC: 4.15 MIL/uL (ref 3.87–5.11)
RDW: 13.9 % (ref 11.5–15.5)
WBC: 5.4 10*3/uL (ref 4.0–10.5)
nRBC: 0 % (ref 0.0–0.2)

## 2021-09-28 LAB — CREATININE, SERUM
Creatinine, Ser: 0.72 mg/dL (ref 0.44–1.00)
GFR, Estimated: >60 mL/min (ref >60)

## 2021-09-28 LAB — URINE CULTURE: Culture: <10000 — AB

## 2021-09-28 MED ORDER — ENOXAPARIN SODIUM 40 MG/0.4ML IJ SOSY
40.0000 mg | PREFILLED_SYRINGE | INTRAMUSCULAR | Status: DC
Start: 2021-09-28 — End: 2021-10-06
  Administered 2021-09-28 – 2021-10-05 (×8): 40 mg via SUBCUTANEOUS
  Filled 2021-09-28 (×8): qty 0.4

## 2021-09-28 MED ORDER — POLYVINYL ALCOHOL 1.4 % OP SOLN: 1.0000 [drp] | OPHTHALMIC | Status: DC | PRN

## 2021-09-28 MED ORDER — HYDRALAZINE HCL 50 MG PO TABS
50.0000 mg | ORAL_TABLET | Freq: Two times a day (BID) | ORAL | Status: DC
  Administered 2021-09-28: 50 mg via ORAL
  Filled 2021-09-28 (×2): qty 2

## 2021-09-28 MED ORDER — LOSARTAN POTASSIUM 50 MG PO TABS
100.0000 mg | ORAL_TABLET | Freq: Every day | ORAL | Status: DC
  Administered 2021-09-28 – 2021-10-03 (×6): 100 mg via ORAL
  Filled 2021-09-28 (×6): qty 2

## 2021-09-28 MED ORDER — LAMOTRIGINE 100 MG PO TABS
150.0000 mg | ORAL_TABLET | Freq: Three times a day (TID) | ORAL | Status: DC
  Administered 2021-09-28: 150 mg via ORAL
  Filled 2021-09-28 (×2): qty 2

## 2021-09-28 MED ORDER — LOSARTAN POTASSIUM 50 MG PO TABS
100.0000 mg | ORAL_TABLET | Freq: Every day | ORAL | Status: DC
  Administered 2021-09-28: 100 mg via ORAL
  Filled 2021-09-28: qty 4

## 2021-09-28 MED ORDER — LAMOTRIGINE 25 MG PO TABS
150.0000 mg | ORAL_TABLET | Freq: Every day | ORAL | Status: DC
  Administered 2021-09-28: 150 mg via ORAL
  Filled 2021-09-28: qty 2

## 2021-09-28 MED ORDER — SODIUM CHLORIDE 0.9% FLUSH
3.0000 mL | Freq: Two times a day (BID) | INTRAVENOUS | Status: DC
  Administered 2021-09-29 – 2021-10-05 (×14): 3 mL via INTRAVENOUS

## 2021-09-28 MED ORDER — TRAZODONE HCL 50 MG PO TABS
25.0000 mg | ORAL_TABLET | Freq: Every evening | ORAL | Status: DC | PRN
  Administered 2021-09-29 – 2021-10-03 (×3): 25 mg via ORAL
  Filled 2021-09-28 (×3): qty 1

## 2021-09-28 MED ORDER — LAMOTRIGINE 100 MG PO TABS
150.0000 mg | ORAL_TABLET | Freq: Three times a day (TID) | ORAL | Status: DC
  Administered 2021-09-28 – 2021-10-05 (×23): 150 mg via ORAL
  Filled 2021-09-28 (×24): qty 2

## 2021-09-28 MED ORDER — LATANOPROST 0.005 % OP SOLN
1.0000 [drp] | Freq: Every day | OPHTHALMIC | Status: DC
Start: 2021-09-28 — End: 2021-10-06
  Administered 2021-09-29 – 2021-10-05 (×7): 1 [drp] via OPHTHALMIC
  Filled 2021-09-28: qty 2.5

## 2021-09-28 MED ORDER — LORATADINE 10 MG PO TABS: 10.0000 mg | ORAL_TABLET | Freq: Every day | ORAL | Status: DC | PRN

## 2021-09-28 MED ORDER — OXYCODONE HCL 5 MG PO TABS
5.0000 mg | ORAL_TABLET | ORAL | Status: DC | PRN
  Filled 2021-09-28: qty 1

## 2021-09-28 MED ORDER — ONDANSETRON HCL 4 MG/2ML IJ SOLN: 4.0000 mg | Freq: Four times a day (QID) | INTRAMUSCULAR | Status: DC | PRN

## 2021-09-28 MED ORDER — MECLIZINE HCL 25 MG PO TABS: 25.0000 mg | ORAL_TABLET | Freq: Two times a day (BID) | ORAL | Status: DC | PRN

## 2021-09-28 MED ORDER — METOPROLOL SUCCINATE ER 50 MG PO TB24
50.0000 mg | ORAL_TABLET | Freq: Every day | ORAL | Status: DC
  Administered 2021-09-28 – 2021-10-05 (×8): 50 mg via ORAL
  Filled 2021-09-28 (×9): qty 1

## 2021-09-28 MED ORDER — ONDANSETRON HCL 4 MG PO TABS: 4.0000 mg | ORAL_TABLET | Freq: Four times a day (QID) | ORAL | Status: DC | PRN

## 2021-09-28 MED ORDER — CITALOPRAM HYDROBROMIDE 20 MG PO TABS
30.0000 mg | ORAL_TABLET | Freq: Every day | ORAL | Status: DC
  Administered 2021-09-28 – 2021-10-05 (×8): 30 mg via ORAL
  Filled 2021-09-28 (×8): qty 2

## 2021-09-28 MED ORDER — GABAPENTIN 300 MG PO CAPS
300.0000 mg | ORAL_CAPSULE | Freq: Every day | ORAL | Status: DC | PRN
  Administered 2021-09-29 – 2021-10-05 (×2): 300 mg via ORAL
  Administered 2021-10-05: 600 mg via ORAL
  Filled 2021-09-28: qty 1
  Filled 2021-09-28: qty 2
  Filled 2021-09-28: qty 1
  Filled 2021-09-28: qty 2

## 2021-09-28 MED ORDER — ACETAMINOPHEN 650 MG RE SUPP: 650.0000 mg | Freq: Four times a day (QID) | RECTAL | Status: DC | PRN

## 2021-09-28 MED ORDER — SALINE SPRAY 0.65 % NA SOLN: 1.0000 | NASAL | Status: DC | PRN

## 2021-09-28 MED ORDER — TIMOLOL MALEATE 0.5 % OP SOLN
1.0000 [drp] | Freq: Every morning | OPHTHALMIC | Status: DC
Start: 2021-09-29 — End: 2021-10-06
  Administered 2021-09-29 – 2021-10-05 (×7): 1 [drp] via OPHTHALMIC
  Filled 2021-09-28: qty 5

## 2021-09-28 MED ORDER — LIP MEDEX EX OINT: 1.0000 | TOPICAL_OINTMENT | CUTANEOUS | Status: DC | PRN

## 2021-09-28 MED ORDER — HYDRALAZINE HCL 50 MG PO TABS
50.0000 mg | ORAL_TABLET | Freq: Two times a day (BID) | ORAL | Status: DC
  Administered 2021-09-28 – 2021-10-05 (×15): 50 mg via ORAL
  Filled 2021-09-28 (×15): qty 1

## 2021-09-28 MED ORDER — PHENOL 1.4 % MT LIQD: 1.0000 | OROMUCOSAL | Status: DC | PRN

## 2021-09-28 MED ORDER — GABAPENTIN 100 MG PO CAPS
100.0000 mg | ORAL_CAPSULE | Freq: Three times a day (TID) | ORAL | Status: DC
  Administered 2021-09-28 – 2021-10-05 (×24): 100 mg via ORAL
  Filled 2021-09-28 (×24): qty 1

## 2021-09-28 MED ORDER — SODIUM CHLORIDE 0.9 % IV SOLN
1.0000 g | INTRAVENOUS | Status: AC
  Administered 2021-09-28 – 2021-09-29 (×2): 1 g via INTRAVENOUS
  Filled 2021-09-28 (×2): qty 10

## 2021-09-28 MED ORDER — ACETAMINOPHEN 325 MG PO TABS
650.0000 mg | ORAL_TABLET | Freq: Four times a day (QID) | ORAL | Status: DC | PRN
  Administered 2021-09-28 – 2021-10-04 (×4): 650 mg via ORAL
  Filled 2021-09-28 (×4): qty 2

## 2021-09-28 MED ORDER — GABAPENTIN 300 MG PO CAPS
300.0000 mg | ORAL_CAPSULE | Freq: Once | ORAL | Status: AC
  Administered 2021-09-28: 300 mg via ORAL
  Filled 2021-09-28: qty 1

## 2021-09-28 NOTE — ED Notes (Signed)
[facility] at bedside. Pt stable for transport. 

## 2021-09-28 NOTE — H&P (Addendum)
History and Physical    Crystal Huber LFY:[id number] DOB: 1945-06-10 DOA: 09/27/2021  PCP: [name], NP  Patient coming from: Home  Chief Complaint: Fall  HPI: Crystal Huber is a 76 y.o. female with medical history significant of arthritis, breast cancer (h/o), GERD, HLD, HTN, MS (relapsing remitting), trigeminal neuralgia and vertigo who presented for falls and urinary symptoms.  She notes a fall about 2 days ago where she was using her walker and moving in her home.  She noted that she got dizzy and felt herself falling.  She fell into a dresser and a chair and then she presented to the hospital.  She has had worsening falls in the last few months, and specifically 6 falls in the last week.  She was seen in the ED on 7/10 and found to have rib fractures.  She has a history of MS, has never had an enclosed MRI per her report, only open MRIs in the past due to severe claustrophobia.  She further notes symptoms of UTI including burning with urination, difficulty with emptying her bladder and abdominal pain.  She feels like she has a UTI.  Her MS was initially present with gait abnormality.  Given her knee immobilizer in place, gait was not tested.  She notes being told that she has thin bones by her PCP, but never being placed on treatment for this.  She has been trying to remember to take calcium.  She currently denies any pain, SOB.  She notes no weakness, but feels that her dizziness is what caused her to fall.  She takes meclizine for this at home.   ED Course: In the ED, she was found to have relatively normal blood work, normal WBC.  UA showed moderate WBC, nitrite negative.  UC showed insignificant growth.  She has received 1 dose of rocephin.  CT head showed no acute abnormality, knee xray showed patellar fracture, CT chest showed multiple acute and subacute rib fractures, lungs clear with no PTX, CT knee showed non displaced patellar fracture and subtle possible fracture of the tibia.  Dr.  Grandville Silos in surgery discussed this case with the EDP.   Review of Systems: As per HPI otherwise all other systems reviewed and are negative.   Past Medical History:  Diagnosis Date   Arthritis    right pinkie   Cancer ([place])    right BR  CA    Cataract    right eye   GERD (gastroesophageal reflux disease)    prn tums, mild not often   Glaucoma    Hyperlipidemia    Hypertension    Multiple sclerosis ([place])    Neuromuscular disorder ([place])    trigeminal neuralgia   Refusal of blood product    Trigeminal neuralgia of left side of face     Past Surgical History:  Procedure Laterality Date   BREAST LUMPECTOMY Right    wears prosthesis   COLONOSCOPY  12/20/2010   ganglion cyst removal Bilateral    PARTIAL HYSTERECTOMY     POLYPECTOMY      Social History  reports that she has quit smoking. Her smoking use included cigarettes. She has never used smokeless tobacco. She reports current alcohol use of about 14.0 standard drinks of alcohol per week. She reports that she does not use drugs.  Allergies  Allergen Reactions   Carbamazepine     Decreased sodium level   Imipramine     Night sweats, more intense dreams   Metronidazole  Nausea Only   Oxcarbazepine     Decreased sodium level   Penicillins Swelling    Yeast infection   Red Blood Cells Other (See Comments)    Jehovah's witness, does not want blood products   Statins     Gets hyponatremia and severe muscle pain   Sulfa Antibiotics Swelling    yeast infection    Family History  Problem Relation Age of Onset   Congestive Heart Failure Mother    Heart disease Father    Heart attack Father    Stroke Sister    Breast cancer Sister    Heart disease Sister    Heart disease Brother    Heart disease Brother    Heart disease Brother    Colon cancer Neg Hx    Colon polyps Neg Hx    Prior to Admission medications   Medication Sig Start Date End Date Taking? Authorizing Provider  Biotin w/ Vitamins C & E  (HAIR/SKIN/NAILS PO) Take 1 tablet by mouth daily.   Yes [provider]  citalopram (CELEXA) 20 MG tablet Take 1.5 tablets (30 mg total) by mouth daily. 04/27/21  Yes [name], NP  gabapentin (NEURONTIN) 300 MG capsule Take 1 or 2 pills p.o. 3 times a day (up to 5 a day) Patient taking differently: Take 300-600 mg by mouth 3 (three) times daily. (Can take up to 5 a day if needed) 08/13/21  Yes Sater, Nanine Means, MD  hydrALAZINE (APRESOLINE) 50 MG tablet TAKE ONE TABLET BY MOUTH TWICE DAILY Patient taking differently: Take 50 mg by mouth 2 (two) times daily. 04/09/21  Yes [name], NP  HYDROcodone-acetaminophen (NORCO/VICODIN) 5-325 MG tablet Take 1 tablet by mouth every 6 (six) hours as needed. 09/24/21  Yes Dorie Rank, MD  lamoTRIgine (LAMICTAL) 150 MG tablet Take 1 tablet (150 mg total) by mouth 3 (three) times daily. 06/14/21  Yes Sater, Nanine Means, MD  latanoprost (XALATAN) 0.005 % ophthalmic solution Place 1 drop into both eyes at bedtime.   Yes [provider]  losartan (COZAAR) 100 MG tablet TAKE ONE TABLET BY MOUTH ONE TIME DAILY Patient taking differently: Take 100 mg by mouth daily. 06/05/21  Yes [name], NP  meclizine (ANTIVERT) 25 MG tablet Take 25 mg by mouth daily. 08/04/15  Yes [provider]  metoprolol succinate (TOPROL-XL) 50 MG 24 hr tablet TAKE ONE TABLET BY MOUTH ONE TIME DAILY Patient taking differently: Take 50 mg by mouth daily. 09/03/21  Yes [name], NP  Multiple Vitamin (MULTIVITAMIN) tablet Take 1 tablet by mouth daily.   Yes [provider]  timolol (TIMOPTIC) 0.5 % ophthalmic solution Place 1 drop into both eyes every morning. 03/21/20  Yes [provider]    Physical Exam: Vitals:   09/28/21 0800 09/28/21 1000 09/28/21 1200 09/28/21 1455  BP: (!) 162/96 (!) 156/83 138/68 (!) 160/79  Pulse:  (!) 56 (!) 59 (!) 53  Resp: '16 19 16   '$ Temp:    98.1 F (36.7 C)  TempSrc:    Oral  SpO2:  97%  97% 98%  Weight:      Height:        Constitutional: NAD, calm, comfortable Eyes: lids and conjunctivae normal, EOMI ENMT: Mucous membranes are mildly Neck: normal, supple Respiratory: CTAB, no wheezing.  She has TTP over the lateral right ribs in the mid axillary line.  Otherwise, no chest pain Cardiovascular: Regular rate and rhythm, + systolic murmur heard best in  the LUSB Abdomen: no tenderness, no masses palpated. +BS Musculoskeletal: no clubbing / cyanosis. She is wearing a knee immobilizer on the right.  She is able to move her hip and has normal range of motion at the ankle and hip bilaterally.  Skin: no rashes, lesions, ulcers on exposed skin Neurologic: Cranial nerves are grossly intact, she has pain over palpation of the trigeminal nerve, particularly the V3 branch.  She has intact strength in the upper and lower extremities.  She is able to move her right leg well without gravity and lift it somewhat off the bed, though pain is limiting.  Strength at the ankle is 5/5 on both lower legs.   Psychiatric: Normal judgment and insight. Alert and oriented x 3. Normal mood.    Labs on Admission: I have personally reviewed following labs and imaging studies  CBC: Recent Labs  Lab 09/24/21 2016 09/27/21 1122  WBC 7.1 5.2  NEUTROABS  --  3.1  HGB 12.6 12.3  HCT 37.8 37.3  MCV 96.4 96.6  PLT 207 109    Basic Metabolic Panel: Recent Labs  Lab 09/24/21 2016 09/27/21 1122  NA 132* 135  K 3.6 4.5  CL 98 102  CO2 28 26  GLUCOSE 232* 82  BUN 21 18  CREATININE 0.80 0.62  CALCIUM 8.8* 9.1    GFR: Estimated Creatinine Clearance: 49.5 mL/min (by C-G formula based on SCr of 0.62 mg/dL).  Liver Function Tests: Recent Labs  Lab 09/27/21 1300  AST 23  ALT 18  ALKPHOS 93  BILITOT 0.9  PROT 6.8  ALBUMIN 4.0    Urine analysis:    Component Value Date/Time   COLORURINE YELLOW 09/27/2021 1220   APPEARANCEUR CLEAR 09/27/2021 1220   LABSPEC 1.015 09/27/2021 1220    PHURINE 6.5 09/27/2021 1220   GLUCOSEU NEGATIVE 09/27/2021 1220   HGBUR TRACE (A) 09/27/2021 1220   BILIRUBINUR NEGATIVE 09/27/2021 1220   KETONESUR NEGATIVE 09/27/2021 1220   PROTEINUR NEGATIVE 09/27/2021 1220   NITRITE NEGATIVE 09/27/2021 1220   LEUKOCYTESUR MODERATE (A) 09/27/2021 1220    Radiological Exams on Admission: CT Knee Right Wo Contrast  Result Date: 09/27/2021 CLINICAL DATA:  Knee trauma, tibial plateau fracture (Age >= 5y) EXAM: CT OF THE RIGHT KNEE WITHOUT CONTRAST TECHNIQUE: Multidetector CT imaging of the right knee was performed according to the standard protocol. Multiplanar CT image reconstructions were also generated. RADIATION DOSE REDUCTION: This exam was performed according to the departmental dose-optimization program which includes automated exposure control, adjustment of the mA and/or kV according to patient size and/or use of iterative reconstruction technique. COMPARISON:  X-ray 09/28/2018 FINDINGS: Bones/Joint/Cartilage Acute transversely oriented nondisplaced fracture through the inferior pole of patella (series 8, image 111). Fracture line closely approximates but does not definitively extend to the articular surface of the patella. There is subtle buckling of the posteromedial cortex of the proximal tibial metaphysis (series 7, image 79) with subtly increased sclerosis suggesting a possible nondisplaced impaction fracture. No well-defined fracture line. No evidence of a intra-articular fracture involving the articular surface of the tibial plateau. The bones are diffusely demineralized. Alignment is maintained. Mild patellofemoral joint space narrowing. Trace knee joint effusion. No lipohemarthrosis. Ligaments Suboptimally assessed by CT. Muscles and Tendons No acute musculotendinous abnormality by CT. Soft tissues Mild prepatellar subcutaneous edema. No fluid collection or hematoma. IMPRESSION: 1. Acute transversely oriented nondisplaced fracture through the inferior  pole of the patella. 2. Subtle buckling of the posteromedial cortex of the proximal tibial metaphysis with subtly increased  sclerosis suggesting a possible nondisplaced impaction fracture. 3. Trace knee joint effusion.  No lipohemarthrosis. Electronically Signed   By: Davina Poke D.O.   On: 09/27/2021 15:41   CT CHEST ABDOMEN PELVIS W CONTRAST  Result Date: 09/27/2021 CLINICAL DATA:  Polytrauma, blunt EXAM: CT CHEST, ABDOMEN, AND PELVIS WITH CONTRAST TECHNIQUE: Multidetector CT imaging of the chest, abdomen and pelvis was performed following the standard protocol during bolus administration of intravenous contrast. RADIATION DOSE REDUCTION: This exam was performed according to the departmental dose-optimization program which includes automated exposure control, adjustment of the mA and/or kV according to patient size and/or use of iterative reconstruction technique. CONTRAST:  124m OMNIPAQUE IOHEXOL 300 MG/ML  SOLN COMPARISON:  Rib series 09/24/2021 FINDINGS: CT CHEST FINDINGS Cardiovascular: Heart size within normal limits. No pericardial effusion. Thoracic aorta is nonaneurysmal. Scattered atherosclerotic calcifications of the aorta and coronary arteries. Central pulmonary vasculature is nondilated. Mediastinum/Nodes: No enlarged mediastinal, hilar, or axillary lymph nodes. Thyroid gland, trachea, and esophagus demonstrate no significant findings. No evidence of mediastinal hematoma. Small hiatal hernia. Lungs/Pleura: No evidence of pulmonary contusion or laceration. No pleural effusion or pneumothorax. Musculoskeletal: Multiple acute or subacute fractures of the right ribs including the right fourth, fifth, sixth, seventh, eighth, and ninth ribs. The right fourth rib is fractured in 2 locations. There are additional healed right-sided rib fractures. Old healed fracture deformity of the right scapula. Additional old healed fractures of the left sixth and seventh ribs. Old healed mid sternal body  fracture. Thoracic vertebral body heights are maintained. Multiple surgical clips in the right axillary and right breast soft tissues. No chest wall fluid collection or hematoma. CT ABDOMEN PELVIS FINDINGS Hepatobiliary: No hepatic injury or perihepatic hematoma. Gallbladder is unremarkable. Pancreas: Unremarkable. No pancreatic ductal dilatation or surrounding inflammatory changes. Spleen: No splenic injury or perisplenic hematoma. Adrenals/Urinary Tract: No adrenal hemorrhage or renal injury identified. No renal stone or hydronephrosis. Bladder is moderately distended but otherwise unremarkable. Stomach/Bowel: Small hiatal hernia. Stomach is otherwise within normal limits. Appendix appears normal. No evidence of bowel wall thickening, distention, or inflammatory changes. Vascular/Lymphatic: Scattered aortic atherosclerosis. No enlarged abdominal or pelvic lymph nodes. Reproductive: Status post hysterectomy. No adnexal masses. Other: No free fluid. No abdominopelvic fluid collection. No pneumoperitoneum. No abdominal wall hernia. Musculoskeletal: Old healed fracture deformity of the right pubic bone. No acute fracture. Pelvic bony ring intact without fracture or diastasis. Lumbar vertebral body heights and alignment are maintained. IMPRESSION: 1. Multiple acute or subacute right-sided rib fractures involving the right fourth through ninth ribs. The right fourth rib is fractured in 2 locations. 2. Lungs are clear.  No pneumothorax. 3. Old healed fracture deformities of the right scapula, left sixth and seventh ribs, right pubic bone, and mid sternal body. 4. No acute abdominopelvic findings. 5. Aortic Atherosclerosis (ICD10-I70.0). Electronically Signed   By: NDavina PokeD.O.   On: 09/27/2021 14:14   CT Cervical Spine Wo Contrast  Result Date: 09/27/2021 CLINICAL DATA:  Trauma, fall EXAM: CT CERVICAL SPINE WITHOUT CONTRAST TECHNIQUE: Multidetector CT imaging of the cervical spine was performed without  intravenous contrast. Multiplanar CT image reconstructions were also generated. RADIATION DOSE REDUCTION: This exam was performed according to the departmental dose-optimization program which includes automated exposure control, adjustment of the mA and/or kV according to patient size and/or use of iterative reconstruction technique. COMPARISON:  07/29/2021 FINDINGS: Alignment: There is minimal anterolisthesis at C4-C5 and C7-T1 levels suggesting previous ligamentous injury. Alignment of the posterior margins of vertebral bodies is otherwise unremarkable.  Skull base and vertebrae: No recent fracture is seen. There is a 9 mm smoothly marginated calcification posterior to the spinous processes of C4 and C5 vertebrae suggesting old ligamentous injury. There is possible old tiny avulsion fracture at the tip of spinous process of C7 vertebra with no significant interval change. Soft tissues and spinal canal: There is no central spinal stenosis. Disc levels: There is encroachment of neural foramina at C5-C6 and C6-C7 levels more so on the right side at C6-C7 level. Upper chest: Unremarkable. Other: None. IMPRESSION: No recent fracture is seen in cervical spine. Cervical spondylosis with encroachment of neural foramina at C5-C6 and C6-C7 levels. No significant interval changes are noted. Electronically Signed   By: Elmer Picker M.D.   On: 09/27/2021 14:08   DG Knee Complete 4 Views Right  Result Date: 09/27/2021 CLINICAL DATA:  Trauma EXAM: RIGHT KNEE - COMPLETE 4+ VIEW COMPARISON:  Knee radiograph 09/24/2021 FINDINGS: There is a nondisplaced fracture of the inferior patella. Trace joint effusion. Additional possible nondisplaced fracture of the medial tibial plateau which is potentially extra-articular. IMPRESSION: Nondisplaced inferior patellar fracture. Possible nondisplaced extra-articular medial tibial plateau fracture. Consider CT. Electronically Signed   By: Maurine Simmering M.D.   On: 09/27/2021 13:39   DG  Shoulder Right  Result Date: 09/27/2021 CLINICAL DATA:  Trauma, fall EXAM: RIGHT SHOULDER - 2+ VIEW COMPARISON:  None Available. FINDINGS: No displaced fracture or dislocation is seen. Degenerative changes are noted in the [facility] joint. Surgical clips are seen in right axilla. There is displaced fracture in the posterolateral aspect of right seventh rib. There may be other undisplaced fractures in the adjacent ribs. IMPRESSION: No fracture or dislocation is seen in right shoulder. There is displaced fracture in the posterolateral aspect of right seventh rib, possibly recent. Electronically Signed   By: Elmer Picker M.D.   On: 09/27/2021 13:36   CT Head Wo Contrast  Result Date: 09/27/2021 CLINICAL DATA:  Multiple falls. EXAM: CT HEAD WITHOUT CONTRAST TECHNIQUE: Contiguous axial images were obtained from the base of the skull through the vertex without intravenous contrast. RADIATION DOSE REDUCTION: This exam was performed according to the departmental dose-optimization program which includes automated exposure control, adjustment of the mA and/or kV according to patient size and/or use of iterative reconstruction technique. COMPARISON:  Head CT Jul 29, 2021 FINDINGS: Brain: Similar periventricular hypodensities with the largest hypodensity along the anterior right lateral ventricle consistent with known history of multiple. No new focal hypodensities identified. Evidence of acute large vascular territory infarction, hemorrhage, hydrocephalus, extra-axial collection or mass lesion/mass effect. Vascular: No hyperdense vessel. Atherosclerotic calcifications of the internal and vertebral arteries at the skull base. Skull: Small extra calvarial hematoma overlying the right vertex on coronal image 89/4. Hyperostosis interna. Negative for fracture or focal lesion. Sinuses/Orbits: Visualized portions of the paranasal sinuses are predominantly clear. Orbits are grossly unremarkable. Other: Mastoid air cells are  predominantly clear. IMPRESSION: 1. No acute intracranial findings. 2. Similar white matter hypodensities compatible with known history of multiple sclerosis. Electronically Signed   By: Dahlia Bailiff M.D.   On: 09/27/2021 11:45    EKG: Independently reviewed. Mild bradycardia, normal T waves, no ST changes.   Assessment/Plan  Multiple rib fractures Vertigo Tibial fracture - Knee immobilizer in place - Surgical Telemetry - Pain control with tylenol and oxycodone PRN (she notes no pain currently) - Orthopedics, Dr. Doreatha Martin, was contacted (listed on treatment team) to let their team know she has arrived at [facility] - PT/OT once  orthopedics has seen her  UTI, present on admission - Patient with symptoms, UA concerning - UC with insignificant growth - Received 1 dose of Rocephin, will plan to complete 3 days of therapy, day 2 of 3 today  Multiple sclerosis ([place]) - MRI brain on 08/28/21 - noted foci consistent with h/o MS.  No changes compared to 09/19/2016.  - Last visit with [name] on 08/14/21: Noted no new symptoms off of therapy for MS - MRI showed no new lesions off therapy - PT/OT as above, she has no new apparent deficits, but is falling more at home - Consider neurology consult if patient not progressing as expected, ? Need for new MRI and further MS treatment.   Fragility fracture - I could not find a DEXA scan on cursory review of Care Everywhere, appears to have been ordered, but do not see that it was done - Will need possible bisphosphonate  Trigeminal neuralgia - Continue home lamictal and gabapentin    Primary hypertension -Continue home hydralazine, losartan, metoprolol    Glaucoma - Continue home eye drops  Anxiety disorder - Continue home citalopram   DVT prophylaxis: Lovenox, hold for any procedures Code Status:   Full  Family Communication:  HCPOA is niece Tammy  Disposition Plan:   Patient is from:  Home  Anticipated DC to:  SNF for rehab  Anticipated DC  date:  09/29/21  Anticipated DC barriers: Ability to walk, need for surgery  Consults called:  Orthopedics, Dr. Grandville Silos spoke with EDP, I messaged Dr. Doreatha Martin who is listed on treatment team  Admission status:  Obs, Surg telemetry   Severity of Illness: The appropriate patient status for this patient is OBSERVATION. Observation status is judged to be reasonable and necessary in order to provide the required intensity of service to ensure the patient's safety. The patient's presenting symptoms, physical exam findings, and initial radiographic and laboratory data in the context of their medical condition is felt to place them at decreased risk for further clinical deterioration. Furthermore, it is anticipated that the patient will be medically stable for discharge from the hospital within 2 midnights of admission.     Gilles Chiquito MD [facility]  How to contact the [facility] Attending or Consulting provider [place] or covering provider during after hours [place], for this patient?   Check the care team in [facility] and look for a) attending/consulting TR[facility] provider listed and b) the [facility] team listed Log into [email or web address] and use [place]'s universal password to access. If you do not have the password, please contact the hospital operator. Locate the [facility] provider you are looking for under [facility] and page to a number that you can be directly reached. If you still have difficulty reaching the provider, please page the [facility] (Director on Call) for the Hospitalists listed on amion for assistance.  09/28/2021, 4:17 PM

## 2021-09-28 NOTE — ED Notes (Signed)
Called bed ready for [facility] '@1'$ :02

## 2021-09-28 NOTE — Plan of Care (Signed)

## 2021-09-29 ENCOUNTER — Other Ambulatory Visit: Payer: Self-pay | Admitting: Nurse Practitioner

## 2021-09-29 DIAGNOSIS — Z789 Other specified health status: Secondary | ICD-10-CM

## 2021-09-29 DIAGNOSIS — R42 Dizziness and giddiness: Secondary | ICD-10-CM | POA: Diagnosis present

## 2021-09-29 DIAGNOSIS — S2249XA Multiple fractures of ribs, unspecified side, initial encounter for closed fracture: Secondary | ICD-10-CM | POA: Diagnosis not present

## 2021-09-29 DIAGNOSIS — S82001A Unspecified fracture of right patella, initial encounter for closed fracture: Secondary | ICD-10-CM | POA: Diagnosis present

## 2021-09-29 DIAGNOSIS — Z90711 Acquired absence of uterus with remaining cervical stump: Secondary | ICD-10-CM | POA: Diagnosis not present

## 2021-09-29 DIAGNOSIS — E8809 Other disorders of plasma-protein metabolism, not elsewhere classified: Secondary | ICD-10-CM | POA: Diagnosis present

## 2021-09-29 DIAGNOSIS — F413 Other mixed anxiety disorders: Secondary | ICD-10-CM | POA: Diagnosis not present

## 2021-09-29 DIAGNOSIS — S82101A Unspecified fracture of upper end of right tibia, initial encounter for closed fracture: Secondary | ICD-10-CM

## 2021-09-29 DIAGNOSIS — Z882 Allergy status to sulfonamides status: Secondary | ICD-10-CM | POA: Diagnosis not present

## 2021-09-29 DIAGNOSIS — I10 Essential (primary) hypertension: Secondary | ICD-10-CM

## 2021-09-29 DIAGNOSIS — H409 Unspecified glaucoma: Secondary | ICD-10-CM | POA: Diagnosis present

## 2021-09-29 DIAGNOSIS — S2241XA Multiple fractures of ribs, right side, initial encounter for closed fracture: Secondary | ICD-10-CM | POA: Diagnosis present

## 2021-09-29 DIAGNOSIS — Z888 Allergy status to other drugs, medicaments and biological substances status: Secondary | ICD-10-CM | POA: Diagnosis not present

## 2021-09-29 DIAGNOSIS — Z87891 Personal history of nicotine dependence: Secondary | ICD-10-CM | POA: Diagnosis not present

## 2021-09-29 DIAGNOSIS — G500 Trigeminal neuralgia: Secondary | ICD-10-CM | POA: Diagnosis present

## 2021-09-29 DIAGNOSIS — Z79899 Other long term (current) drug therapy: Secondary | ICD-10-CM | POA: Diagnosis not present

## 2021-09-29 DIAGNOSIS — Z880 Allergy status to penicillin: Secondary | ICD-10-CM | POA: Diagnosis not present

## 2021-09-29 DIAGNOSIS — Z853 Personal history of malignant neoplasm of breast: Secondary | ICD-10-CM | POA: Diagnosis not present

## 2021-09-29 DIAGNOSIS — Z8249 Family history of ischemic heart disease and other diseases of the circulatory system: Secondary | ICD-10-CM | POA: Diagnosis not present

## 2021-09-29 DIAGNOSIS — E7800 Pure hypercholesterolemia, unspecified: Secondary | ICD-10-CM

## 2021-09-29 DIAGNOSIS — E785 Hyperlipidemia, unspecified: Secondary | ICD-10-CM | POA: Diagnosis present

## 2021-09-29 DIAGNOSIS — G35 Multiple sclerosis: Secondary | ICD-10-CM | POA: Diagnosis present

## 2021-09-29 DIAGNOSIS — W19XXXA Unspecified fall, initial encounter: Secondary | ICD-10-CM | POA: Diagnosis present

## 2021-09-29 DIAGNOSIS — N3000 Acute cystitis without hematuria: Secondary | ICD-10-CM

## 2021-09-29 DIAGNOSIS — D649 Anemia, unspecified: Secondary | ICD-10-CM | POA: Diagnosis present

## 2021-09-29 DIAGNOSIS — E871 Hypo-osmolality and hyponatremia: Secondary | ICD-10-CM | POA: Diagnosis present

## 2021-09-29 DIAGNOSIS — R296 Repeated falls: Secondary | ICD-10-CM | POA: Diagnosis present

## 2021-09-29 DIAGNOSIS — K219 Gastro-esophageal reflux disease without esophagitis: Secondary | ICD-10-CM | POA: Diagnosis present

## 2021-09-29 LAB — CBC
HCT: 35.9 % — ABNORMAL LOW (ref 36.0–46.0)
Hemoglobin: 12.1 g/dL (ref 12.0–15.0)
MCH: 32.1 pg (ref 26.0–34.0)
MCHC: 33.7 g/dL (ref 30.0–36.0)
MCV: 95.2 fL (ref 80.0–100.0)
Platelets: 251 10*3/uL (ref 150–400)
RBC: 3.77 MIL/uL — ABNORMAL LOW (ref 3.87–5.11)
RDW: 13.6 % (ref 11.5–15.5)
WBC: 6 10*3/uL (ref 4.0–10.5)
nRBC: 0 % (ref 0.0–0.2)

## 2021-09-29 LAB — COMPREHENSIVE METABOLIC PANEL
ALT: 15 U/L (ref 0–44)
AST: 19 U/L (ref 15–41)
Albumin: 3.3 g/dL — ABNORMAL LOW (ref 3.5–5.0)
Alkaline Phosphatase: 89 U/L (ref 38–126)
Anion gap: 12 (ref 5–15)
BUN: 17 mg/dL (ref 8–23)
CO2: 25 mmol/L (ref 22–32)
Calcium: 9 mg/dL (ref 8.9–10.3)
Chloride: 99 mmol/L (ref 98–111)
Creatinine, Ser: 0.78 mg/dL (ref 0.44–1.00)
GFR, Estimated: >60 mL/min (ref >60)
Glucose, Bld: 105 mg/dL — ABNORMAL HIGH (ref 70–99)
Potassium: 4.1 mmol/L (ref 3.5–5.1)
Sodium: 136 mmol/L (ref 135–145)
Total Bilirubin: 0.6 mg/dL (ref 0.3–1.2)
Total Protein: 5.9 g/dL — ABNORMAL LOW (ref 6.5–8.1)

## 2021-09-29 LAB — GLUCOSE, CAPILLARY: Glucose-Capillary: 109 mg/dL — ABNORMAL HIGH (ref 70–99)

## 2021-09-29 NOTE — Plan of Care (Signed)
  Problem: Activity: Goal: Risk for activity intolerance will decrease Outcome: Progressing   Problem: Nutrition: Goal: Adequate nutrition will be maintained Outcome: Progressing   Problem: Elimination: Goal: Will not experience complications related to urinary retention Outcome: Progressing   

## 2021-09-29 NOTE — Plan of Care (Signed)

## 2021-09-29 NOTE — Plan of Care (Signed)
  Problem: Education: Goal: Knowledge of General Education information will improve Description: Including pain rating scale, medication(s)/side effects and non-pharmacologic comfort measures Outcome: Not Progressing   Problem: Health Behavior/Discharge Planning: Goal: Ability to manage health-related needs will improve Outcome: Not Progressing   Problem: Clinical Measurements: Goal: Ability to maintain clinical measurements within normal limits will improve Outcome: Not Progressing Goal: Will remain free from infection Outcome: Not Progressing Goal: Diagnostic test results will improve Outcome: Not Progressing Goal: Respiratory complications will improve Outcome: Not Progressing Goal: Cardiovascular complication will be avoided Outcome: Not Progressing   Problem: Pain Managment: Goal: General experience of comfort will improve Outcome: Not Progressing   Problem: Safety: Goal: Ability to remain free from injury will improve Outcome: Not Progressing   Problem: Skin Integrity: Goal: Risk for impaired skin integrity will decrease Outcome: Not Progressing

## 2021-09-29 NOTE — Progress Notes (Signed)
PROGRESS NOTE    Crystal Huber  ZOX:[id number] DOB: Mar 21, 1945 DOA: 09/27/2021 PCP: [name], NP   Brief Narrative:  HPI per Dr. Gilles Chiquito on 09/28/21  Crystal Huber is a 76 y.o. female with medical history significant of arthritis, breast cancer (h/o), GERD, HLD, HTN, MS (relapsing remitting), trigeminal neuralgia and vertigo who presented for falls and urinary symptoms.  She notes a fall about 2 days ago where she was using her walker and moving in her home.  She noted that she got dizzy and felt herself falling.  She fell into a dresser and a chair and then she presented to the hospital.  She has had worsening falls in the last few months, and specifically 6 falls in the last week.  She was seen in the ED on 7/10 and found to have rib fractures.  She has a history of MS, has never had an enclosed MRI per her report, only open MRIs in the past due to severe claustrophobia.  She further notes symptoms of UTI including burning with urination, difficulty with emptying her bladder and abdominal pain.  She feels like she has a UTI.  Her MS was initially present with gait abnormality.  Given her knee immobilizer in place, gait was not tested.  She notes being told that she has thin bones by her PCP, but never being placed on treatment for this.  She has been trying to remember to take calcium.  She currently denies any pain, SOB.  She notes no weakness, but feels that her dizziness is what caused her to fall.  She takes meclizine for this at home.    ED Course: In the ED, she was found to have relatively normal blood work, normal WBC.  UA showed moderate WBC, nitrite negative.  UC showed insignificant growth.  She has received 1 dose of rocephin.  CT head showed no acute abnormality, knee xray showed patellar fracture, CT chest showed multiple acute and subacute rib fractures, lungs clear with no PTX, CT knee showed non displaced patellar fracture and subtle possible fracture of the tibia.  Dr.  Grandville Silos in surgery discussed this case with the EDP.    **Interim History She still awaiting Ortho evaluation.   Assessment and Plan:  Multiple rib fractures Vertigo Tibial fracture - Knee immobilizer in place - Surgical Telemetry - Pain control with tylenol and oxycodone PRN (she notes no pain currently) - Orthopedics, Dr. Doreatha Martin, was contacted (listed on treatment team) to let their team know she has arrived at [facility] and I spoke with Dr. Percell Miller today and they will see the patient in the morning for further recommendations - PT/OT once orthopedics has seen her but per my Discussion she will be Toe Touch WB -Check Orthostatic VS   UTI, present on admission - Patient with symptoms, UA concerning - UC with insignificant growth - Received 1 dose of Rocephin, will plan to complete 3 days of therapy, day 3 of 3 today   Multiple sclerosis ([place]) - MRI brain on 08/28/21 - noted foci consistent with h/o MS.  No changes compared to 09/19/2016.  - Last visit with [name] on 08/14/21: Noted no new symptoms off of therapy for MS - MRI showed no new lesions off therapy - PT/OT as above, she has no new apparent deficits, but is falling more at home - Consider neurology consult if patient not progressing as expected, ? Need for new MRI and further MS treatment but will discuss with neurology  in the morning   Fragility fracture - I could not find a DEXA scan on cursory review of Care Everywhere, appears to have been ordered, but do not see that it was done - Will need possible bisphosphonate   Trigeminal neuralgia - Continue home lamictal and gabapentin    Primary hypertension -Continue home hydralazine, losartan, metoprolol -Continue monitor blood pressures per protocol    Glaucoma - Continue home eye drops   Anxiety disorder - Continue home citalopram  DVT prophylaxis: enoxaparin (LOVENOX) injection 40 mg Start: 09/28/21 1800 SCDs Start: 09/28/21 1551    Code Status: Full Code Family  Communication: No family currently at bedside  Disposition Plan:  Level of care: Telemetry Surgical Status is: Inpatient Remains inpatient appropriate because: Needs orthopedic clearance and evaluation by PT and OT to further evaluate disposition planning   Consultants:  Trauma Orthopedic Surgery  Procedures:  None  Antimicrobials:  Anti-infectives (From admission, onward)    Start     Dose/Rate Route Frequency Ordered Stop   09/28/21 1730  cefTRIAXone (ROCEPHIN) 1 g in sodium chloride 0.9 % 100 mL IVPB        1 g 200 mL/hr over 30 Minutes Intravenous Every 24 hours 09/28/21 1637 09/29/21 1701   09/27/21 1500  cefTRIAXone (ROCEPHIN) 1 g in sodium chloride 0.9 % 100 mL IVPB        1 g 200 mL/hr over 30 Minutes Intravenous  Once 09/27/21 1449 09/27/21 1830        Subjective: Seen and examined at bedside and states that she had some mild pain taking a big deep breath in.  Also complained of some pain in her knee.  No nausea or vomiting.  Was a little confused this morning.  No other concerns or plaints at this time.  Objective: Vitals:   09/28/21 2004 09/29/21 0359 09/29/21 0807 09/29/21 1518  BP: (!) 155/70 (!) 173/61 (!) 153/73 (!) 167/86  Pulse: 63 (!) 55 (!) 51 (!) 54  Resp:      Temp: 98.4 F (36.9 C) 98.3 F (36.8 C) 97.8 F (36.6 C) 98.2 F (36.8 C)  TempSrc: Oral Oral Oral Oral  SpO2: 98% 95% 98% 93%  Weight:      Height:        Intake/Output Summary (Last 24 hours) at 09/29/2021 1842 Last data filed at 09/29/2021 1800 Gross per 24 hour  Intake 1511 ml  Output --  Net 1511 ml   Filed Weights   09/27/21 1102 09/28/21 1801  Weight: 59.9 kg 59.9 kg   Examination: Physical Exam:  Constitutional: Thin Caucasian female currently in no acute distress Respiratory: Diminished to auscultation bilaterally, no wheezing, rales, rhonchi or crackles. Normal respiratory effort and patient is not tachypenic. No accessory muscle use.  Unlabored  breathing Cardiovascular: RRR, no murmurs / rubs / gallops. S1 and S2 auscultated. No extremity edema.  Abdomen: Soft, non-tender, non-distended.  Bowel sounds positive.  GU: Deferred. Musculoskeletal: No clubbing / cyanosis of digits/nails.  Right leg is in a knee immobilizer Skin: No rashes, lesions, ulcers on limited skin evaluation. No induration; Warm and dry.  Neurologic: CN 2-12 grossly intact with no focal deficits. Romberg sign and cerebellar reflexes not assessed.  Psychiatric: Normal judgment and insight. Alert and oriented x 3. Normal mood and appropriate affect.   Data Reviewed: I have personally reviewed following labs and imaging studies  CBC: Recent Labs  Lab 09/24/21 2016 09/27/21 1122 09/28/21 1621 09/29/21 0216  WBC 7.1 5.2 5.4 6.0  NEUTROABS  --  3.1  --   --   HGB 12.6 12.3 13.2 12.1  HCT 37.8 37.3 39.7 35.9*  MCV 96.4 96.6 95.7 95.2  PLT 207 248 240 474   Basic Metabolic Panel: Recent Labs  Lab 09/24/21 2016 09/27/21 1122 09/28/21 1621 09/29/21 0216  NA 132* 135  --  136  K 3.6 4.5  --  4.1  CL 98 102  --  99  CO2 28 26  --  25  GLUCOSE 232* 82  --  105*  BUN 21 18  --  17  CREATININE 0.80 0.62 0.72 0.78  CALCIUM 8.8* 9.1  --  9.0   GFR: Estimated Creatinine Clearance: 49.5 mL/min (by C-G formula based on SCr of 0.78 mg/dL). Liver Function Tests: Recent Labs  Lab 09/27/21 1300 09/29/21 0216  AST 23 19  ALT 18 15  ALKPHOS 93 89  BILITOT 0.9 0.6  PROT 6.8 5.9*  ALBUMIN 4.0 3.3*   Recent Labs  Lab 09/27/21 1300  LIPASE 26   No results for input(s): "AMMONIA" in the last 168 hours. Coagulation Profile: Recent Labs  Lab 09/27/21 1300  INR 1.0   Cardiac Enzymes: No results for input(s): "CKTOTAL", "CKMB", "CKMBINDEX", "TROPONINI" in the last 168 hours. BNP (last 3 results) No results for input(s): "PROBNP" in the last 8760 hours. HbA1C: No results for input(s): "HGBA1C" in the last 72 hours. CBG: Recent Labs  Lab  09/29/21 0739  GLUCAP 109*   Lipid Profile: No results for input(s): "CHOL", "HDL", "LDLCALC", "TRIG", "CHOLHDL", "LDLDIRECT" in the last 72 hours. Thyroid Function Tests: No results for input(s): "TSH", "T4TOTAL", "FREET4", "T3FREE", "THYROIDAB" in the last 72 hours. Anemia Panel: No results for input(s): "VITAMINB12", "FOLATE", "FERRITIN", "TIBC", "IRON", "RETICCTPCT" in the last 72 hours. Sepsis Labs: No results for input(s): "PROCALCITON", "LATICACIDVEN" in the last 168 hours.  Recent Results (from the past 240 hour(s))  Urine Culture     Status: Abnormal   Collection Time: 09/27/21  1:00 PM   Specimen: Urine, Clean Catch  Result Value Ref Range Status   Specimen Description   Final    URINE, CLEAN CATCH Performed at [facility], [address] [place], [place] [address]    Special Requests   Final    NONE Performed at [facility], [address] [place], [place] [address]    Culture (A)  Final    <10,000 COLONIES/mL INSIGNIFICANT GROWTH Performed at [facility], [address] [place], [place] [address]    Report Status 09/28/2021 FINAL  Final     Radiology Studies: No results found.  Scheduled Meds:  citalopram  30 mg Oral Daily   enoxaparin (LOVENOX) injection  40 mg Subcutaneous Q24H   gabapentin  100 mg Oral TID   hydrALAZINE  50 mg Oral BID   lamoTRIgine  150 mg Oral TID   latanoprost  1 drop Both Eyes QHS   losartan  100 mg Oral Daily   metoprolol succinate  50 mg Oral Daily   sodium chloride flush  3 mL Intravenous Q12H   timolol  1 drop Both Eyes q morning   Continuous Infusions:   LOS: 0 days   Raiford Noble, DO [facility] Available via Epic secure chat 7am-7pm After these hours, please refer to coverage provider listed on [email or web address] 09/29/2021, 6:42 PM

## 2021-09-29 NOTE — Hospital Course (Signed)
HPI per Dr. Gilles Chiquito on 09/28/21  Crystal Huber is a 76 y.o. female with medical history significant of arthritis, breast cancer (h/o), GERD, HLD, HTN, MS (relapsing remitting), trigeminal neuralgia and vertigo who presented for falls and urinary symptoms.  She notes a fall about 2 days ago where she was using her walker and moving in her home.  She noted that she got dizzy and felt herself falling.  She fell into a dresser and a chair and then she presented to the hospital.  She has had worsening falls in the last few months, and specifically 6 falls in the last week.  She was seen in the ED on 7/10 and found to have rib fractures.  She has a history of MS, has never had an enclosed MRI per her report, only open MRIs in the past due to severe claustrophobia.  She further notes symptoms of UTI including burning with urination, difficulty with emptying her bladder and abdominal pain.  She feels like she has a UTI.  Her MS was initially present with gait abnormality.  Given her knee immobilizer in place, gait was not tested.  She notes being told that she has thin bones by her PCP, but never being placed on treatment for this.  She has been trying to remember to take calcium.  She currently denies any pain, SOB.  She notes no weakness, but feels that her dizziness is what caused her to fall.  She takes meclizine for this at home.    ED Course: In the ED, she was found to have relatively normal blood work, normal WBC.  UA showed moderate WBC, nitrite negative.  UC showed insignificant growth.  She has received 1 dose of rocephin.  CT head showed no acute abnormality, knee xray showed patellar fracture, CT chest showed multiple acute and subacute rib fractures, lungs clear with no PTX, CT knee showed non displaced patellar fracture and subtle possible fracture of the tibia.  Dr. Grandville Silos in surgery discussed this case with the EDP.    **Interim History She still awaiting Ortho evaluation.

## 2021-09-30 ENCOUNTER — Inpatient Hospital Stay (HOSPITAL_COMMUNITY): Payer: Medicare Other

## 2021-09-30 DIAGNOSIS — S82001A Unspecified fracture of right patella, initial encounter for closed fracture: Secondary | ICD-10-CM | POA: Diagnosis not present

## 2021-09-30 DIAGNOSIS — E7800 Pure hypercholesterolemia, unspecified: Secondary | ICD-10-CM | POA: Diagnosis not present

## 2021-09-30 DIAGNOSIS — S82144A Nondisplaced bicondylar fracture of right tibia, initial encounter for closed fracture: Secondary | ICD-10-CM | POA: Diagnosis not present

## 2021-09-30 DIAGNOSIS — N3000 Acute cystitis without hematuria: Secondary | ICD-10-CM | POA: Diagnosis not present

## 2021-09-30 DIAGNOSIS — G35 Multiple sclerosis: Secondary | ICD-10-CM

## 2021-09-30 LAB — CBC WITH DIFFERENTIAL/PLATELET
Abs Immature Granulocytes: 0.02 10*3/uL (ref 0.00–0.07)
Basophils Absolute: 0.1 10*3/uL (ref 0.0–0.1)
Basophils Relative: 1 %
Eosinophils Absolute: 0.2 10*3/uL (ref 0.0–0.5)
Eosinophils Relative: 3 %
HCT: 35.3 % — ABNORMAL LOW (ref 36.0–46.0)
Hemoglobin: 11.9 g/dL — ABNORMAL LOW (ref 12.0–15.0)
Immature Granulocytes: 0 %
Lymphocytes Relative: 28 %
Lymphs Abs: 1.8 10*3/uL (ref 0.7–4.0)
MCH: 31.6 pg (ref 26.0–34.0)
MCHC: 33.7 g/dL (ref 30.0–36.0)
MCV: 93.9 fL (ref 80.0–100.0)
Monocytes Absolute: 1.1 10*3/uL — ABNORMAL HIGH (ref 0.1–1.0)
Monocytes Relative: 17 %
Neutro Abs: 3.3 10*3/uL (ref 1.7–7.7)
Neutrophils Relative %: 51 %
Platelets: 258 10*3/uL (ref 150–400)
RBC: 3.76 MIL/uL — ABNORMAL LOW (ref 3.87–5.11)
RDW: 13.6 % (ref 11.5–15.5)
WBC: 6.4 10*3/uL (ref 4.0–10.5)
nRBC: 0 % (ref 0.0–0.2)

## 2021-09-30 LAB — PHOSPHORUS: Phosphorus: 3.4 mg/dL (ref 2.5–4.6)

## 2021-09-30 LAB — COMPREHENSIVE METABOLIC PANEL
ALT: 15 U/L (ref 0–44)
AST: 17 U/L (ref 15–41)
Albumin: 3.3 g/dL — ABNORMAL LOW (ref 3.5–5.0)
Alkaline Phosphatase: 90 U/L (ref 38–126)
Anion gap: 12 (ref 5–15)
BUN: 14 mg/dL (ref 8–23)
CO2: 24 mmol/L (ref 22–32)
Calcium: 9.1 mg/dL (ref 8.9–10.3)
Chloride: 100 mmol/L (ref 98–111)
Creatinine, Ser: 0.74 mg/dL (ref 0.44–1.00)
GFR, Estimated: >60 mL/min (ref >60)
Glucose, Bld: 86 mg/dL (ref 70–99)
Potassium: 3.7 mmol/L (ref 3.5–5.1)
Sodium: 136 mmol/L (ref 135–145)
Total Bilirubin: 0.6 mg/dL (ref 0.3–1.2)
Total Protein: 5.9 g/dL — ABNORMAL LOW (ref 6.5–8.1)

## 2021-09-30 LAB — MAGNESIUM: Magnesium: 2.1 mg/dL (ref 1.7–2.4)

## 2021-09-30 MED ORDER — DIAZEPAM 5 MG PO TABS
5.0000 mg | ORAL_TABLET | Freq: Once | ORAL | Status: AC | PRN
  Administered 2021-09-30: 5 mg via ORAL
  Filled 2021-09-30: qty 1

## 2021-09-30 MED ORDER — ORAL CARE MOUTH RINSE: 15.0000 mL | OROMUCOSAL | Status: DC | PRN

## 2021-09-30 NOTE — Evaluation (Signed)
Physical Therapy Evaluation Patient Details Name: Crystal Huber MRN: [id number] DOB: 1945/04/22 Today's Date: 09/30/2021  History of Present Illness  Pt is a 76 y/o F presenting to ED on 7/13 after fall, workup revealing R patellar fx, possible nondisplaced tibial plateau fracture on right, and multiple rib fxs (3-9 on right side). PMH includes arthritis, breast CA, GERD, HTN, HLD, RRMS, trigeminal neuralgia, and vertigo.  Clinical Impression  Pt agreeable to physical therapy evaluation. Pt unreliable historian but she reports cognition deficits at baseline. Pt able to perform lateral scoot transfer from bed to chair with moderate assistance but requires max assist x 2 to stand with RW (unable to take steps due to weakness and WB status). Pt currently presents with functional limitations secondary to impairments listed in PT problem list. Pt to benefit from skilled, acute care physical therapy interventions to maximize her strength, balance, independence level, and quality of life.       Recommendations for follow up therapy are one component of a multi-disciplinary discharge planning process, led by the attending physician.  Recommendations may be updated based on patient status, additional functional criteria and insurance authorization.  Follow Up Recommendations Acute inpatient rehab (3hours/day)      Assistance Recommended at Discharge Frequent or constant Supervision/Assistance  Patient can return home with the following  A lot of help with walking and/or transfers;A lot of help with bathing/dressing/bathroom;Assist for transportation;Assistance with cooking/housework    Equipment Recommendations Wheelchair (measurements PT)  Recommendations for Other Services  Rehab consult    Functional Status Assessment Patient has had a recent decline in their functional status and demonstrates the ability to make significant improvements in function in a reasonable and predictable amount of time.      Precautions / Restrictions Precautions Precautions: Fall Required Braces or Orthoses: Knee Immobilizer - Right Knee Immobilizer - Right: On at all times Restrictions Weight Bearing Restrictions: Yes RLE Weight Bearing: Non weight bearing      Mobility  Bed Mobility Overal bed mobility: Needs Assistance Bed Mobility: Supine to Sit     Supine to sit: Min assist     General bed mobility comments: increased time    Transfers Overall transfer level: Needs assistance Equipment used: Rolling walker (2 wheels) Transfers: Sit to/from Stand, Bed to chair/wheelchair/BSC Sit to Stand: Max assist, +2 physical assistance, From elevated surface          Lateral/Scoot Transfers: Mod assist General transfer comment: Pt with difficulty maintaining her WB status when standing with RW due to decreased ability to follow commands, weakness, and learning curve. Pt performed lateral scoot transfer from bed to chair with moderate assistance x 1; pt required increased cues for hand placements and technique while avoiding using R LE on floor.    Ambulation/Gait               General Gait Details: attempted side stepping at side of bed but pt unable to scoot her left foot on floor significant distance or hop  Stairs            Wheelchair Mobility    Modified Rankin (Stroke Patients Only)       Balance Overall balance assessment: Needs assistance Sitting-balance support: Feet supported, Bilateral upper extremity supported Sitting balance-Leahy Scale: Fair     Standing balance support: Bilateral upper extremity supported, During functional activity, Reliant on assistive device for balance Standing balance-Leahy Scale: Poor Standing balance comment: reliant on external support  Pertinent Vitals/Pain Pain Assessment Pain Assessment: 0-10 Pain Score:  (pt unable to quantify when asked) Pain Location: R knee    Home Living  Family/patient expects to be discharged to:: Private residence Living Arrangements: Other (Comment) (pt has roomate that is not there at same time as her) Available Help at Discharge: Friend(s);Family;Available PRN/intermittently Type of Home: House (pt lives in [place]) Home Access: Level entry       Home Layout: One level Home Equipment: Grab bars - tub/shower;Grab bars - toilet;Rolling Walker (2 wheels);Cane - single point;BSC/3in1;Shower seat;Cane - quad      Prior Function Prior Level of Function : Independent/Modified Independent;History of Falls (last six months);Driving             Mobility Comments: Pt states she primarily uses her [facility] when going in unfamiliar places but plans to switch to using RW due to her dizziness episodes and falls. ADLs Comments: Pt is independent with basic ADL's. Pt does not drive so someone takes care of her errands. Pt reports independence with managing her finances and medications.     Hand Dominance        Extremity/Trunk Assessment   Upper Extremity Assessment Upper Extremity Assessment: Defer to OT evaluation    Lower Extremity Assessment Lower Extremity Assessment:  (Pt with L quads and hamstrings at 4/5, L hip flexors 3+/5; R LE limited/not assesed 2/2 KI but ankle WFL)       Communication   Communication: No difficulties  Cognition Arousal/Alertness: Awake/alert Behavior During Therapy: WFL for tasks assessed/performed Overall Cognitive Status: Impaired/Different from baseline Area of Impairment: Attention, Memory, Following commands, Safety/judgement, Problem solving, Awareness                     Memory: Decreased recall of precautions, Decreased short-term memory Following Commands: Follows one step commands inconsistently, Follows one step commands with increased time Safety/Judgement: Decreased awareness of safety, Decreased awareness of deficits Awareness: Emergent, Intellectual Problem Solving: Requires  tactile cues, Requires verbal cues, Decreased initiation, Difficulty sequencing General Comments: Pt unreliable historian and has difficulty answering PLOF. Pt also requiring increased cues to perform basic MMT. Pt reports some cognitive deficits at baseline due to MS but unable to determine her true baseline.        General Comments General comments (skin integrity, edema, etc.): HR WNL    Exercises     Assessment/Plan    PT Assessment Patient needs continued PT services  PT Problem List Decreased strength;Decreased range of motion;Decreased activity tolerance;Decreased balance;Decreased mobility;Decreased cognition;Decreased knowledge of use of DME;Decreased safety awareness;Decreased knowledge of precautions;Pain       PT Treatment Interventions DME instruction;Gait training;Functional mobility training;Therapeutic activities;Therapeutic exercise;Balance training;Neuromuscular re-education;Cognitive remediation;Patient/family education;Modalities    PT Goals (Current goals can be found in the Care Plan section)  Acute Rehab PT Goals Patient Stated Goal: none stated PT Goal Formulation: With patient Time For Goal Achievement: 10/13/21 Potential to Achieve Goals:  (fair to good)    Frequency Min 5X/week     Co-evaluation   Reason for Co-Treatment: Complexity of the patient's impairments (multi-system involvement);For patient/therapist safety;To address functional/ADL transfers   OT goals addressed during session: ADL's and self-care       AM-PAC PT "6 Clicks" Mobility  Outcome Measure Help needed turning from your back to your side while in a flat bed without using bedrails?: A Little Help needed moving from lying on your back to sitting on the side of a flat bed without using bedrails?: A Little  Help needed moving to and from a bed to a chair (including a wheelchair)?: A Lot Help needed standing up from a chair using your arms (e.g., wheelchair or bedside chair)?:  Total Help needed to walk in hospital room?: Total Help needed climbing 3-5 steps with a railing? : Total 6 Click Score: 11    End of Session Equipment Utilized During Treatment: Gait belt;Right knee immobilizer Activity Tolerance: Other (comment) (limited by weakness and R LE deficits) Patient left: in chair;with call bell/phone within reach;with chair alarm set Nurse Communication: Mobility status;Weight bearing status PT Visit Diagnosis: Other abnormalities of gait and mobility (R26.89);Repeated falls (R29.6);Muscle weakness (generalized) (M62.81)    Time: 2458-0998 PT Time Calculation (min) (ACUTE ONLY): 26 min   Charges:   PT Evaluation $PT Eval Moderate Complexity: 1 Mod          Donna Bernard, PT   [facility] 09/30/2021, 2:17 PM

## 2021-09-30 NOTE — Progress Notes (Signed)
PROGRESS NOTE    Crystal Huber  ZYS:[id number] DOB: 1946-03-17 DOA: 09/27/2021 PCP: [name], NP   Brief Narrative:  HPI per Dr. Gilles Chiquito on 09/28/21  Crystal Huber is a 76 y.o. female with medical history significant of arthritis, breast cancer (h/o), GERD, HLD, HTN, MS (relapsing remitting), trigeminal neuralgia and vertigo who presented for falls and urinary symptoms.  She notes a fall about 2 days ago where she was using her walker and moving in her home.  She noted that she got dizzy and felt herself falling.  She fell into a dresser and a chair and then she presented to the hospital.  She has had worsening falls in the last few months, and specifically 6 falls in the last week.  She was seen in the ED on 7/10 and found to have rib fractures.  She has a history of MS, has never had an enclosed MRI per her report, only open MRIs in the past due to severe claustrophobia.  She further notes symptoms of UTI including burning with urination, difficulty with emptying her bladder and abdominal pain.  She feels like she has a UTI.  Her MS was initially present with gait abnormality.  Given her knee immobilizer in place, gait was not tested.  She notes being told that she has thin bones by her PCP, but never being placed on treatment for this.  She has been trying to remember to take calcium.  She currently denies any pain, SOB.  She notes no weakness, but feels that her dizziness is what caused her to fall.  She takes meclizine for this at home.    ED Course: In the ED, she was found to have relatively normal blood work, normal WBC.  UA showed moderate WBC, nitrite negative.  UC showed insignificant growth.  She has received 1 dose of rocephin.  CT head showed no acute abnormality, knee xray showed patellar fracture, CT chest showed multiple acute and subacute rib fractures, lungs clear with no PTX, CT knee showed non displaced patellar fracture and subtle possible fracture of the tibia.  Dr.  Grandville Silos in surgery discussed this case with the EDP.    **Interim History Orthopedic surgery evaluated for her right nondisplaced patella fracture with possible nondisplaced tibial plateau fracture and they recommended getting an MRI of the right knee.  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  Orthopedic surgery recommended weightbearing as tolerated and being in the knee immobilizer at all times.  They recommended mobilizing with PT and are going to treat this nonoperatively.  PT OT evaluated and recommending CIR.  We will consult CR for rehab assessment and evaluation.   Assessment and Plan:  Multiple rib fractures Vertigo Tibial fracture - Knee immobilizer in place - Surgical Telemetry - Pain control with tylenol and oxycodone PRN (she notes no pain currently) - Orthopedics, Dr. Doreatha Martin, was contacted (listed on treatment team) to let their team know she has arrived at [facility] and I spoke with Dr. Percell Miller today and they will see the patient in the morning for further recommendations -Orthopedic surgery evaluated and recommending keeping her in a longer knee immobilizer given that she was able to bend her knee and discussed the need for an MRI to better see the possible tibial plateau fracture.  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 Percell Miller in 1 to 2 weeks - PT/OT recommending CIR -We will check orthostatic vital signs for her vertigo and continue meclizine  UTI, present on  admission - Patient with symptoms, UA concerning - UC with insignificant growth - Received 1 dose of Rocephin, will plan to complete 3 days of therapy and has done and completed    Multiple sclerosis ([place]) - MRI brain on 08/28/21 - noted foci consistent with h/o MS.  No changes compared to 09/19/2016.  - Last visit with [name] on 08/14/21: Noted no new symptoms off of therapy for MS - MRI showed no new lesions off therapy - PT/OT as above, she has no new apparent deficits, but is falling more at home - Consider neurology consult if patient not progressing as expected, ? Need for new MRI and further MS treatment but will discuss with neurology in the morning   Fragility Fracture -Dr. Daryll Drown could not find a DEXA scan on cursory review of Care Everywhere, appears to have been ordered, but do not see that it was done -Will need possible bisphosphonate   Trigeminal Neuralgia - Continue home Lamotrigine 150 mg po TID and Gabapentin 100 mg po TID and Gabapentin 300-600 mg po 5 Times Daily     Primary Hypertension -Continue home Hydralazine 50 mg po BID, Losartan 100 mg po Daily, and Metoprolol Succinate 50 mg po Daily  -Continue monitor blood pressures per protocol -Last BP reading was 138/76    Glaucoma -Continue home eye drops Latanoprost 1 drop in both eyes nightly and timolol 1 drop in both eyes every morning   Anxiety Disorder - Continue home Citalopram 30 mg po Daily   DVT prophylaxis: enoxaparin (LOVENOX) injection 40 mg Start: 09/28/21 1800 SCDs Start: 09/28/21 1551    Code Status: Full Code Family Communication: No family currently at bedside  Disposition Plan:  Level of care: Telemetry Surgical Status is: Inpatient Remains inpatient appropriate because: PT OT recommending CIR   Consultants:  Orthopedic Surgery  Procedures:  None  Antimicrobials:  Anti-infectives (From admission, onward)    Start     Dose/Rate Route Frequency Ordered Stop   09/28/21 1730   cefTRIAXone (ROCEPHIN) 1 g in sodium chloride 0.9 % 100 mL IVPB        1 g 200 mL/hr over 30 Minutes Intravenous Every 24 hours 09/28/21 1637 09/29/21 1900   09/27/21 1500  cefTRIAXone (ROCEPHIN) 1 g in sodium chloride 0.9 % 100 mL IVPB        1 g 200 mL/hr over 30 Minutes Intravenous  Once 09/27/21 1449 09/27/21 1830       Subjective: And examined at bedside and she is sitting in the chair and states that she did not have very good night given that her knee immobilizer was while in her.  Underwent MRI which showed no tibial plateau fracture.  No lightheadedness or dizziness.  Feels okay.  No other concerns or complaints this time.  Objective: Vitals:   09/29/21 1518 09/29/21 2013 09/30/21 0430 09/30/21 0838  BP: (!) 167/86 (!) 159/65 138/71 138/76  Pulse: (!) 54 (!) 55 (!) 58 61  Resp:    18  Temp: 98.2 F (36.8 C) 98 F (36.7  C) 98.5 F (36.9 C) 98.8 F (37.1 C)  TempSrc: Oral Oral Oral Oral  SpO2: 93% 95% 93% 95%  Weight:      Height:        Intake/Output Summary (Last 24 hours) at 09/30/2021 1512 Last data filed at 09/29/2021 2134 Gross per 24 hour  Intake 1384 ml  Output --  Net 1384 ml   Filed Weights   09/27/21 1102 09/28/21 1801  Weight: 59.9 kg 59.9 kg   Examination: Physical Exam:  Constitutional: Thin Caucasian female currently in no acute distress Respiratory: Diminished to auscultation bilaterally with coarse breath sounds, no wheezing, rales, rhonchi or crackles. Normal respiratory effort and patient is not tachypenic. No accessory muscle use.  Unlabored breathing Cardiovascular: RRR, no murmurs / rubs / gallops. S1 and S2 auscultated. No extremity edema.  Abdomen: Soft, non-tender, non-distended. Bowel sounds positive.  GU: Deferred. Musculoskeletal: No clubbing / cyanosis of digits/nails.  Right leg is Leg immobilizer currently Skin: Has some bruising noted around her elbow from her fall Neurologic: CN 2-12 grossly intact with no focal deficits.  Romberg sign cerebellar reflexes not assessed.  Psychiatric: A little confused but answers questions appropriately and has a normal mood and affect.   Data Reviewed: I have personally reviewed following labs and imaging studies  CBC: Recent Labs  Lab 09/24/21 2016 09/27/21 1122 09/28/21 1621 09/29/21 0216 09/30/21 0237  WBC 7.1 5.2 5.4 6.0 6.4  NEUTROABS  --  3.1  --   --  3.3  HGB 12.6 12.3 13.2 12.1 11.9*  HCT 37.8 37.3 39.7 35.9* 35.3*  MCV 96.4 96.6 95.7 95.2 93.9  PLT 207 248 240 251 557   Basic Metabolic Panel: Recent Labs  Lab 09/24/21 2016 09/27/21 1122 09/28/21 1621 09/29/21 0216 09/30/21 0237  NA 132* 135  --  136 136  K 3.6 4.5  --  4.1 3.7  CL 98 102  --  99 100  CO2 28 26  --  25 24  GLUCOSE 232* 82  --  105* 86  BUN 21 18  --  17 14  CREATININE 0.80 0.62 0.72 0.78 0.74  CALCIUM 8.8* 9.1  --  9.0 9.1  MG  --   --   --   --  2.1  PHOS  --   --   --   --  3.4   GFR: Estimated Creatinine Clearance: 49.5 mL/min (by C-G formula based on SCr of 0.74 mg/dL). Liver Function Tests: Recent Labs  Lab 09/27/21 1300 09/29/21 0216 09/30/21 0237  AST '23 19 17  '$ ALT '18 15 15  '$ ALKPHOS 93 89 90  BILITOT 0.9 0.6 0.6  PROT 6.8 5.9* 5.9*  ALBUMIN 4.0 3.3* 3.3*   Recent Labs  Lab 09/27/21 1300  LIPASE 26   No results for input(s): "AMMONIA" in the last 168 hours. Coagulation Profile: Recent Labs  Lab 09/27/21 1300  INR 1.0   Cardiac Enzymes: No results for input(s): "CKTOTAL", "CKMB", "CKMBINDEX", "TROPONINI" in the last 168 hours. BNP (last 3 results) No results for input(s): "PROBNP" in the last 8760 hours. HbA1C: No results for input(s): "HGBA1C" in the last 72 hours. CBG: Recent Labs  Lab 09/29/21 0739  GLUCAP 109*   Lipid Profile: No results for input(s): "CHOL", "HDL", "LDLCALC", "TRIG", "CHOLHDL", "LDLDIRECT" in the last 72 hours. Thyroid Function Tests: No results for input(s): "TSH", "T4TOTAL", "FREET4", "T3FREE", "THYROIDAB" in the last  72 hours. Anemia Panel: No results for input(s): "VITAMINB12", "FOLATE", "FERRITIN", "TIBC", "IRON", "RETICCTPCT"  in the last 72 hours. Sepsis Labs: No results for input(s): "PROCALCITON", "LATICACIDVEN" in the last 168 hours.  Recent Results (from the past 240 hour(s))  Urine Culture     Status: Abnormal   Collection Time: 09/27/21  1:00 PM   Specimen: Urine, Clean Catch  Result Value Ref Range Status   Specimen Description   Final    URINE, CLEAN CATCH Performed at [facility], [address] [place], [place] [address]    Special Requests   Final    NONE Performed at [facility] [address] [place], [place] [address]    Culture (A)  Final    <10,000 COLONIES/mL INSIGNIFICANT GROWTH Performed at [facility], [address], [place], [place] [address]    Report Status 09/28/2021 FINAL  Final     Radiology Studies: MR KNEE RIGHT WO CONTRAST  Result Date: 09/30/2021 CLINICAL DATA:  Right knee pain. Patellar fracture. Assess medial tibial plateau EXAM: MRI OF THE RIGHT KNEE WITHOUT CONTRAST TECHNIQUE: Multiplanar, multisequence MR imaging of the knee was performed. No intravenous contrast was administered. COMPARISON:  X-ray and CT from 09/27/2021 FINDINGS: Technical Note: Despite efforts by the technologist and patient, motion artifact is present on today's exam and could not be eliminated. This reduces exam sensitivity and specificity. MENISCI Medial meniscus:  Intrasubstance degeneration without discrete tear. Lateral meniscus: Intrasubstance degeneration without discrete tear. LIGAMENTS Cruciates: Intact ACL and PCL. Collaterals: Intact MCL. Lateral collateral ligament complex intact. CARTILAGE Patellofemoral: Focal partial-thickness chondral fissure of the lateral patellar facet (series 10, image 15). No trochlear chondral defect. Medial:  Mild chondral thinning without focal defect. Lateral:  No chondral defect. MISCELLANEOUS Joint: Small joint  effusion. No layering hematocrit level. Soft tissue edema within the superior aspect of Hoffa's fat. Popliteal Fossa:  No Baker's cyst. Intact popliteus tendon. Extensor Mechanism: Intact quadriceps and patellar tendons. Thickening of the proximal patellar tendon. Bones: Acute nondisplaced transversely oriented fracture through the inferior pole of the patella without extension to the articular surface of the patella. Mild bone marrow edema within the anterior aspect of the lateral tibial plateau without associated fracture. There is no fracture or contusion involving the medial tibial plateau, as questioned on previous studies. No marrow replacing bone lesion. Other: No significant periarticular soft tissue findings. IMPRESSION: 1. There is no fracture or contusion involving the medial tibial plateau, as questioned on previous studies. 2. Acute nondisplaced transversely oriented fracture through the inferior pole of the patella. 3. Mild bone contusion involving the anterior aspect of the lateral tibial plateau without associated fracture. 4. Intrasubstance degeneration of the menisci without discrete tear. 5. Mild medial and patellofemoral compartment chondral irregularities, as above. 6. Small joint effusion. 7. Intact cruciate and collateral ligaments. Electronically Signed   By: Davina Poke D.O.   On: 09/30/2021 12:27     Scheduled Meds:  citalopram  30 mg Oral Daily   enoxaparin (LOVENOX) injection  40 mg Subcutaneous Q24H   gabapentin  100 mg Oral TID   hydrALAZINE  50 mg Oral BID   lamoTRIgine  150 mg Oral TID   latanoprost  1 drop Both Eyes QHS   losartan  100 mg Oral Daily   metoprolol succinate  50 mg Oral Daily   sodium chloride flush  3 mL Intravenous Q12H   timolol  1 drop Both Eyes q morning   Continuous Infusions:   LOS: 1 day   Raiford Noble, DO [facility] Available via  Epic secure chat 7am-7pm After these hours, please refer to coverage provider listed on  [email or web address] 09/30/2021, 3:12 PM

## 2021-09-30 NOTE — Progress Notes (Signed)
Inpatient Rehab Admissions Coordinator Note:   Per therapy patient was screened for CIR candidacy by Afshin Chrystal Danford Bad, CCC-SLP. At this time, pt appears to be a potential candidate for CIR. I will place an order for rehab consult for full assessment, per our protocol.  Please contact me any with questions.Gayland Curry, [place], [place] Admissions Coordinator [phone] 09/30/21 5:44 PM

## 2021-09-30 NOTE — Progress Notes (Signed)
Orthopedic Tech Progress Note Patient Details:  DAY GREB [birth year] [id number]  Ortho Devices Type of Ortho Device: Knee Immobilizer Ortho Device/Splint Location: RLE Ortho Device/Splint Interventions: Ordered, Application, Adjustment   Post Interventions Patient Tolerated: Well Instructions Provided: Adjustment of device, Care of device 16in knee immobilizer removed and replaced with a 20in instead. Vernona Rieger 09/30/2021, 4:42 PM

## 2021-09-30 NOTE — Consult Note (Cosign Needed Addendum)
ORTHOPAEDIC CONSULTATION  REQUESTING PHYSICIAN: Kerney Elbe, DO  Chief Complaint: right knee pain  HPI: Crystal Huber is a 76 y.o. female wit history of breast cancer, GERD, hyperlipidemia, HTN, MS, trigeminal neuralgia, and vertigo who complains of  right knee pain after a fall. She fell approximately 3 days ago when she was felt dizzy while walking with her walker. X-rays and CT scan of her right knee were done showing a nondisplaced inferior pole patellar fracture and possible tibial plateau fracture. She was placed in a knee immobilizer and transferred to [facility]. Today she states her knee pain is well controlled. No pain with movement as long as the knee immobilizer is in place. Denies any CP, SOB, Nausea, vomiting, or abdominal pain. She denies any paraesthesias. She walks with a walker at baseline.   Past Medical History:  Diagnosis Date   Arthritis    right pinkie   Cancer ([place])    right BR  CA    Cataract    right eye   GERD (gastroesophageal reflux disease)    prn tums, mild not often   Glaucoma    Hyperlipidemia    Hypertension    Multiple sclerosis ([place])    Neuromuscular disorder ([place])    trigeminal neuralgia   Refusal of blood product    Trigeminal neuralgia of left side of face    Past Surgical History:  Procedure Laterality Date   BREAST LUMPECTOMY Right    wears prosthesis   COLONOSCOPY  12/20/2010   ganglion cyst removal Bilateral    PARTIAL HYSTERECTOMY     POLYPECTOMY     Social History   Socioeconomic History   Marital status: Widowed    Spouse name: Not on file   Number of children: Not on file   Years of education: Not on file   Highest education level: Not on file  Occupational History   Not on file  Tobacco Use   Smoking status: Former    Years: 1.50    Types: Cigarettes   Smokeless tobacco: Never   Tobacco comments:    Quit smoking at age 63 and only smoke occasional x 1.5 years   Vaping Use   Vaping Use: Never used   Substance and Sexual Activity   Alcohol use: Yes    Alcohol/week: 14.0 standard drinks of alcohol    Types: 14 Standard drinks or equivalent per week    Comment: 1-2 glasses of wine at night/fim   Drug use: No   Sexual activity: Not on file  Other Topics Concern   Not on file  Social History Narrative   Diet      Do you drink/eat things with caffeine      Marital Status     What year were you married?      Do you live in a house, apartment, assisted living, condo, trailer, etc.?      Is it one or more stories?      How many persons live in your home?         Do you have any pets in your home?(please list)      Highest level of education completed:      Current or past profession:      Do you exercise?:    Type and how often:      Do you have a Living Will? (Form that indicates scenarios where you would not want your life prolonged)      Do you  have a DNR form?         If not, would you like to discuss one?      Do you have signed POA/HPOA forms? Yes      Do you have difficulty bathing or dressing yourself?      Do you have difficulty preparing food or eating?      Do you have difficulty managing medications?      Do you have difficulty managing your finances?      Do you have difficulty affording your medications?                     Social Determinants of Health   Financial Resource Strain: Not on file  Food Insecurity: Not on file  Transportation Needs: Not on file  Physical Activity: Not on file  Stress: Not on file  Social Connections: Not on file   Family History  Problem Relation Age of Onset   Congestive Heart Failure Mother    Heart disease Father    Heart attack Father    Stroke Sister    Breast cancer Sister    Heart disease Sister    Heart disease Brother    Heart disease Brother    Heart disease Brother    Colon cancer Neg Hx    Colon polyps Neg Hx    Allergies  Allergen Reactions   Carbamazepine     Decreased sodium level    Imipramine     Night sweats, more intense dreams   Metronidazole Nausea Only   Oxcarbazepine     Decreased sodium level   Penicillins Swelling    Yeast infection   Red Blood Cells Other (See Comments)    Jehovah's witness, does not want blood products   Statins     Gets hyponatremia and severe muscle pain   Sulfa Antibiotics Swelling    yeast infection     Positive ROS: All other systems have been reviewed and were otherwise negative with the exception of those mentioned in the HPI and as above.  Physical Exam: General: Alert, no acute distress Cardiovascular: No pedal edema Respiratory: No cyanosis, no use of accessory musculature GI: No organomegaly, abdomen is soft and non-tender Skin: No lesions in the area of chief complaint Neurologic: Sensation intact distally Psychiatric: Patient is competent for consent with normal mood and affect Lymphatic: No axillary or cervical lymphadenopathy  MUSCULOSKELETAL: RLE in knee immobilizer. Ecchymosis over distal patella and patella tendon. Dorsiflexion and Plantarflexion intact. 2+ DP pulse. Distal sensation intact. No joint line tenderness or tenderness to proximal tibia or patella.   Imaging: CT right knee: 1. Acute transversely oriented nondisplaced fracture through the inferior pole of the patella. 2. Subtle buckling of the posteromedial cortex of the proximal tibial metaphysis with subtly increased sclerosis suggesting a possible nondisplaced impaction fracture. 3. Trace knee joint effusion.  No lipohemarthrosis.  Assessment: Right non-displaced patella fracture with possible non-displaced tibial plateau fracture  Plan: Discussed plan with patient.  I like to get her into a longer knee immobilizer as she is currently able to bend her knee and this immobilizer.  Discussed need for MRI of the knee to better see the possible tibial plateau fracture.  These results will allow [place] to possibly allow her to weight-bear sooner.  Patient  is hesitant to have an MRI done but is willing to try to do so today.  In the meantime we will we will plan to keep her nonweightbearing with her right  leg.  She needs to be in the knee immobilizer at all times.  As of right now no surgical needs we will plan to treat these nonoperatively.    Ventura Bruns, PA-C    09/30/2021 7:29 AM  Addendum: MRI of right knee was able to be performed. MRI shows no tibial plateau fracture. 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  Patient is ok to weight bear as tolerated, she should be in her knee immobilizer at all times. Plan to mobilize with PT while inpatient, and otherwise can be discharged home from ortho standpoint whenever cleared by medicine with plan to follow-up with Dr. Percell Miller in 1-2 weeks. Please reach out with any questions or concerns.   Merlene Pulling, PA-C  1:26 PM

## 2021-09-30 NOTE — Evaluation (Signed)
Occupational Therapy Evaluation Patient Details Name: Crystal Huber MRN: [id number] DOB: 1945/06/04 Today's Date: 09/30/2021   History of Present Illness Pt is a 76 y/o F presentin to ED on 7/13 after fall, workup revealing R patellar fx and multiple rib fxs. PMH includes arthritis, breast CA, GERD, HTN, HLD, RRMS, trigeminal neuralgia, and vertigo   Clinical Impression   Pt independent at baseline with ADLs and uses cane for functional mobility, states she lives with a roommate, but the roommate is not home the same times as her. Pt drives, although states her MS doctor has told her not to. Pt currently min-max A for ADLs, min A for bed mobility, and max A +2 for sit to stand transfer/mod A for lateral scoot. Pt with difficulty adhering to NWB precautions during session. Pt states due to MS she has baseline cognitive impairments, pt needing increased cuing for safety, and has decreased awareness of deficits/precautions. Pt presenting with impairments listed below, will follow acutely. Recommend AIR at d/c.     Recommendations for follow up therapy are one component of a multi-disciplinary discharge planning process, led by the attending physician.  Recommendations may be updated based on patient status, additional functional criteria and insurance authorization.   Follow Up Recommendations  Acute inpatient rehab (3hours/day)    Assistance Recommended at Discharge Intermittent Supervision/Assistance  Patient can return home with the following A lot of help with walking and/or transfers;A lot of help with bathing/dressing/bathroom;Assistance with cooking/housework;Direct supervision/assist for medications management;Direct supervision/assist for financial management;Assist for transportation;Help with stairs or ramp for entrance    Functional Status Assessment  Patient has had a recent decline in their functional status and demonstrates the ability to make significant improvements in function in a  reasonable and predictable amount of time.  Equipment Recommendations  None recommended by OT;Other (comment) (defer to next venue of care)    Recommendations for Other Services Rehab consult;PT consult     Precautions / Restrictions Precautions Precautions: Fall Required Braces or Orthoses: Knee Immobilizer - Right Knee Immobilizer - Right: On at all times Restrictions Weight Bearing Restrictions: Yes RLE Weight Bearing: Non weight bearing      Mobility Bed Mobility Overal bed mobility: Needs Assistance Bed Mobility: Supine to Sit     Supine to sit: Min assist     General bed mobility comments: increased time    Transfers Overall transfer level: Needs assistance Equipment used: Rolling walker (2 wheels) Transfers: Sit to/from Stand, Bed to chair/wheelchair/BSC Sit to Stand: Max assist, +2 physical assistance          Lateral/Scoot Transfers: Mod assist General transfer comment: pt with difficulty adhering to NWB status, needing therapists foot under pt's foot, unable to hop/step laterally      Balance Overall balance assessment: Needs assistance Sitting-balance support: Feet supported, Bilateral upper extremity supported Sitting balance-Leahy Scale: Fair     Standing balance support: Bilateral upper extremity supported, During functional activity, Reliant on assistive device for balance Standing balance-Leahy Scale: Poor Standing balance comment: reliant on external support                           ADL either performed or assessed with clinical judgement   ADL Overall ADL's : Needs assistance/impaired Eating/Feeding: Modified independent   Grooming: Modified independent   Upper Body Bathing: Minimal assistance   Lower Body Bathing: Maximal assistance   Upper Body Dressing : Minimal assistance   Lower Body Dressing: Maximal assistance  Toilet Transfer: Maximal assistance;Moderate assistance;+2 for physical assistance   Toileting-  Clothing Manipulation and Hygiene: Maximal assistance       Functional mobility during ADLs: Moderate assistance;Maximal assistance;+2 for physical assistance       Vision Baseline Vision/History: 1 Wears glasses Vision Assessment?: No apparent visual deficits     Perception     Praxis      Pertinent Vitals/Pain Pain Assessment Pain Assessment: No/denies pain     Hand Dominance     Extremity/Trunk Assessment Upper Extremity Assessment Upper Extremity Assessment: Generalized weakness   Lower Extremity Assessment Lower Extremity Assessment: Defer to PT evaluation       Communication Communication Communication: No difficulties   Cognition Arousal/Alertness: Awake/alert Behavior During Therapy: WFL for tasks assessed/performed Overall Cognitive Status: Impaired/Different from baseline Area of Impairment: Attention, Memory, Following commands, Safety/judgement, Problem solving, Awareness                   Current Attention Level: Focused Memory: Decreased short-term memory Following Commands: Follows one step commands inconsistently, Follows one step commands with increased time Safety/Judgement: Decreased awareness of safety, Decreased awareness of deficits Awareness: Emergent Problem Solving: Requires tactile cues, Requires verbal cues, Decreased initiation, Difficulty sequencing General Comments: difficulty answering PLOF/following instructions, pt needing increased cuing for clarification, reports some cognitive deficits at baseline due to MS. Decreased safety awareness and awareness of deficits.     General Comments  VSS on RA    Exercises     Shoulder Instructions      Home Living Family/patient expects to be discharged to:: Private residence Living Arrangements: Other (Comment) (has roommate that is not there the same time as her) Available Help at Discharge: Friend(s);Family;Available PRN/intermittently Type of Home: House (townhome) Home  Access: Level entry     Home Layout: One level     Bathroom Shower/Tub: Walk-in shower         Home Equipment: Grab bars - tub/shower;Grab bars - toilet;Rolling Walker (2 wheels);Cane - single point;BSC/3in1;Shower seat;Cane - quad          Prior Functioning/Environment Prior Level of Function : Independent/Modified Independent;History of Falls (last six months);Driving (states her MS doctor has told her not to drive)             Mobility Comments: using cane for mobility ADLs Comments: ind with ADLs        OT Problem List: Decreased strength;Decreased range of motion;Decreased activity tolerance;Impaired balance (sitting and/or standing);Decreased cognition;Decreased safety awareness      OT Treatment/Interventions: Self-care/ADL training;Therapeutic exercise;DME and/or AE instruction;Therapeutic activities;Patient/family education;Balance training;Cognitive remediation/compensation;Energy conservation    OT Goals(Current goals can be found in the care plan section) Acute Rehab OT Goals Patient Stated Goal: to get better OT Goal Formulation: With patient Time For Goal Achievement: 10/14/21 Potential to Achieve Goals: Good ADL Goals Pt Will Perform Upper Body Dressing: with supervision;sitting;standing Pt Will Perform Lower Body Dressing: with min assist;with adaptive equipment;sit to/from stand;sitting/lateral leans Pt Will Transfer to Toilet: with min assist;with +2 assist;bedside commode;stand pivot transfer Additional ADL Goal #1: pt will complete bed mobility mod I in prep for ADLs  OT Frequency: Min 2X/week    Co-evaluation PT/OT/SLP Co-Evaluation/Treatment: Yes Reason for Co-Treatment: Complexity of the patient's impairments (multi-system involvement);For patient/therapist safety;To address functional/ADL transfers   OT goals addressed during session: ADL's and self-care      AM-PAC OT "6 Clicks" Daily Activity     Outcome Measure Help from another  person eating meals?: None Help from another  person taking care of personal grooming?: A Little Help from another person toileting, which includes using toliet, bedpan, or urinal?: A Lot Help from another person bathing (including washing, rinsing, drying)?: A Lot Help from another person to put on and taking off regular upper body clothing?: A Little Help from another person to put on and taking off regular lower body clothing?: A Lot 6 Click Score: 16   End of Session Equipment Utilized During Treatment: Gait belt;Rolling walker (2 wheels);Right knee immobilizer Nurse Communication: Mobility status;Weight bearing status  Activity Tolerance: Patient tolerated treatment well Patient left: in chair;with call bell/phone within reach;with chair alarm set  OT Visit Diagnosis: Unsteadiness on feet (R26.81);Other abnormalities of gait and mobility (R26.89);Muscle weakness (generalized) (M62.81);History of falling (Z91.81);Other symptoms and signs involving cognitive function                Time: 2060-1561 OT Time Calculation (min): 32 min Charges:  OT General Charges $OT Visit: 1 Visit OT Evaluation $OT Eval Moderate Complexity: 1 [address] OTD, OTR/L Acute Rehab [phone]) [phone] - [place] 09/30/2021, 1:45 PM

## 2021-10-01 DIAGNOSIS — N3000 Acute cystitis without hematuria: Secondary | ICD-10-CM | POA: Diagnosis not present

## 2021-10-01 DIAGNOSIS — E7800 Pure hypercholesterolemia, unspecified: Secondary | ICD-10-CM | POA: Diagnosis not present

## 2021-10-01 DIAGNOSIS — S82001A Unspecified fracture of right patella, initial encounter for closed fracture: Secondary | ICD-10-CM | POA: Diagnosis not present

## 2021-10-01 DIAGNOSIS — G35 Multiple sclerosis: Secondary | ICD-10-CM | POA: Diagnosis not present

## 2021-10-01 LAB — COMPREHENSIVE METABOLIC PANEL
ALT: 14 U/L (ref 0–44)
AST: 19 U/L (ref 15–41)
Albumin: 3.3 g/dL — ABNORMAL LOW (ref 3.5–5.0)
Alkaline Phosphatase: 98 U/L (ref 38–126)
Anion gap: 14 (ref 5–15)
BUN: 18 mg/dL (ref 8–23)
CO2: 24 mmol/L (ref 22–32)
Calcium: 9.3 mg/dL (ref 8.9–10.3)
Chloride: 96 mmol/L — ABNORMAL LOW (ref 98–111)
Creatinine, Ser: 0.84 mg/dL (ref 0.44–1.00)
GFR, Estimated: >60 mL/min (ref >60)
Glucose, Bld: 81 mg/dL (ref 70–99)
Potassium: 3.9 mmol/L (ref 3.5–5.1)
Sodium: 134 mmol/L — ABNORMAL LOW (ref 135–145)
Total Bilirubin: 0.8 mg/dL (ref 0.3–1.2)
Total Protein: 6.1 g/dL — ABNORMAL LOW (ref 6.5–8.1)

## 2021-10-01 LAB — CBC WITH DIFFERENTIAL/PLATELET
Abs Immature Granulocytes: 0.02 10*3/uL (ref 0.00–0.07)
Basophils Absolute: 0.1 10*3/uL (ref 0.0–0.1)
Basophils Relative: 1 %
Eosinophils Absolute: 0.2 10*3/uL (ref 0.0–0.5)
Eosinophils Relative: 3 %
HCT: 35.3 % — ABNORMAL LOW (ref 36.0–46.0)
Hemoglobin: 11.8 g/dL — ABNORMAL LOW (ref 12.0–15.0)
Immature Granulocytes: 0 %
Lymphocytes Relative: 22 %
Lymphs Abs: 1.6 10*3/uL (ref 0.7–4.0)
MCH: 31.6 pg (ref 26.0–34.0)
MCHC: 33.4 g/dL (ref 30.0–36.0)
MCV: 94.6 fL (ref 80.0–100.0)
Monocytes Absolute: 1.1 10*3/uL — ABNORMAL HIGH (ref 0.1–1.0)
Monocytes Relative: 16 %
Neutro Abs: 4.1 10*3/uL (ref 1.7–7.7)
Neutrophils Relative %: 58 %
Platelets: 266 10*3/uL (ref 150–400)
RBC: 3.73 MIL/uL — ABNORMAL LOW (ref 3.87–5.11)
RDW: 13.4 % (ref 11.5–15.5)
WBC: 7.2 10*3/uL (ref 4.0–10.5)
nRBC: 0 % (ref 0.0–0.2)

## 2021-10-01 LAB — PHOSPHORUS: Phosphorus: 3.3 mg/dL (ref 2.5–4.6)

## 2021-10-01 LAB — GLUCOSE, CAPILLARY
Glucose-Capillary: 113 mg/dL — ABNORMAL HIGH (ref 70–99)
Glucose-Capillary: 152 mg/dL — ABNORMAL HIGH (ref 70–99)
Glucose-Capillary: 116 mg/dL — ABNORMAL HIGH (ref 70–99)

## 2021-10-01 LAB — MAGNESIUM: Magnesium: 1.9 mg/dL (ref 1.7–2.4)

## 2021-10-01 MED ORDER — DIPHENHYDRAMINE-ZINC ACETATE 2-0.1 % EX CREA
TOPICAL_CREAM | Freq: Three times a day (TID) | CUTANEOUS | Status: DC | PRN
  Administered 2021-10-01: 1 via TOPICAL
  Filled 2021-10-01: qty 28

## 2021-10-01 NOTE — Progress Notes (Signed)
PROGRESS NOTE    Crystal Huber  EXB:[id number] DOB: [birth year] DOA: 09/27/2021 PCP: [name], NP   Brief Narrative:  HPI per Dr. Gilles Chiquito on 09/28/21  Crystal Huber is a 76 y.o. female with medical history significant of arthritis, breast cancer (h/o), GERD, HLD, HTN, MS (relapsing remitting), trigeminal neuralgia and vertigo who presented for falls and urinary symptoms.  She notes a fall about 2 days ago where she was using her walker and moving in her home.  She noted that she got dizzy and felt herself falling.  She fell into a dresser and a chair and then she presented to the hospital.  She has had worsening falls in the last few months, and specifically 6 falls in the last week.  She was seen in the ED on 7/10 and found to have rib fractures.  She has a history of MS, has never had an enclosed MRI per her report, only open MRIs in the past due to severe claustrophobia.  She further notes symptoms of UTI including burning with urination, difficulty with emptying her bladder and abdominal pain.  She feels like she has a UTI.  Her MS was initially present with gait abnormality.  Given her knee immobilizer in place, gait was not tested.  She notes being told that she has thin bones by her PCP, but never being placed on treatment for this.  She has been trying to remember to take calcium.  She currently denies any pain, SOB.  She notes no weakness, but feels that her dizziness is what caused her to fall.  She takes meclizine for this at home.    ED Course: In the ED, she was found to have relatively normal blood work, normal WBC.  UA showed moderate WBC, nitrite negative.  UC showed insignificant growth.  She has received 1 dose of rocephin.  CT head showed no acute abnormality, knee xray showed patellar fracture, CT chest showed multiple acute and subacute rib fractures, lungs clear with no PTX, CT knee showed non displaced patellar fracture and subtle possible fracture of the tibia.  Dr.  Grandville Silos in surgery discussed this case with the EDP.    **Interim History Orthopedic surgery evaluated for her right nondisplaced patella fracture with possible nondisplaced tibial plateau fracture and they recommended getting an MRI of the right knee.  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  Orthopedic surgery recommended weightbearing as tolerated and being in the knee immobilizer at all times.  They recommended mobilizing with PT and are going to treat this nonoperatively.  PT OT evaluated and recommending CIR.  We will consult CIR for rehab assessment and evaluation and CR was evaluating but then PT evaluated and recommending home health now given patient wanting to go home.  CIR has now signed off the case.  Will need to repeat orthostatic vital signs and ensure the patient can safely go home likely discharge in the next 24 to 48 hours.   Assessment and Plan:  Multiple rib fractures Vertigo Tibial fracture - Knee immobilizer in place - Surgical Telemetry - Pain control with tylenol and oxycodone PRN (she notes no pain currently) - Orthopedics, Dr. Doreatha Martin, was contacted (listed on treatment team) to let their team know she has arrived at [facility] and I spoke with Dr. Percell Miller today and they will see the patient in the morning for further recommendations -Orthopedic surgery  evaluated and recommending keeping her in a longer knee immobilizer given that she was able to bend her knee and discussed the need for an MRI to better see the possible tibial plateau fracture.  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 Percell Miller in 1 to 2 weeks - PT/OT recommending CIR initially and now recommended home health PT.  We will need to make arrangements for safe discharge disposition and TOC involved -We will check orthostatic vital signs for her vertigo and continue meclizine and repeat orthostatic vital signs are pending   UTI, present on admission - Patient with symptoms, UA concerning - UC with insignificant growth - Received 1 dose of Rocephin, will plan to complete 3 days of therapy and has done and completed    Multiple sclerosis ([place]) - MRI brain on 08/28/21 - noted foci consistent with h/o MS.  No changes compared to 09/19/2016.  - Last visit with [name] on 08/14/21: Noted no new symptoms off of therapy for MS - MRI showed no new lesions off therapy - PT/OT as above, she has no new apparent deficits, but is falling more at home but now PT OT have changed her recommendations - Consider neurology consult if patient not progressing as expected, ? Need for new MRI and further MS treatment but will discuss with neurology in the morning if she is not improving   Fragility Fracture -Dr. Daryll Drown could not find a DEXA scan on cursory review of Care Everywhere, appears to have been ordered, but do not see that it was done -Will need possible bisphosphonate and this can be done in outpatient setting  Hyponatremia -Patient's sodium went from 136 is now 134 -Continue monitor trend and repeat CMP in a.m.  Normocytic Anemia -Patient's hemoglobin/hematocrit has gone from 13.2/39.7 -> 12.1/35.9 -> 11.9/35.3 -> 11.8/25.3 -Check anemia panel in the a.m. -Continue to monitor for signs and symptoms bleeding; no overt bleeding noted -Repeat CBC in a.m.  Hypoalbuminemia -Albumin level is 3.3 x3 -Continue monitor and trend and repeat CMP in a.m.   Trigeminal Neuralgia - Continue home  Lamotrigine 150 mg po TID and Gabapentin 100 mg po TID and Gabapentin 300-600 mg po 5 Times Daily     Primary Hypertension -Continue home Hydralazine 50 mg po BID, Losartan 100 mg po Daily, and Metoprolol Succinate 50 mg po Daily  -Continue monitor blood pressures per protocol -Last BP reading was 159/76    Glaucoma -Continue home eye drops Latanoprost 1 drop in both eyes nightly and timolol 1 drop in both eyes every morning   Anxiety Disorder - Continue home Citalopram 30 mg po Daily   DVT prophylaxis: enoxaparin (LOVENOX) injection 40 mg Start: 09/28/21 1800 SCDs Start: 09/28/21 1551    Code Status: Full Code Family Communication: No family present at bedside   Disposition Plan:  Level of care: Telemetry Surgical Status is: Inpatient Remains inpatient appropriate because: Initial plan was for the patient to go to inpatient rehabilitation but now that she improved and walked with therapy 100 feet [place] has been updated to home health PT and so she now also go home.   Consultants:  Orthopedic surgery CIR  Procedures:  None  Antimicrobials:  Anti-infectives (From admission, onward)    Start     Dose/Rate Route Frequency Ordered Stop   09/28/21 1730  cefTRIAXone (ROCEPHIN) 1 g in sodium chloride 0.9 % 100 mL IVPB        1 g 200 mL/hr over 30 Minutes Intravenous Every 24 hours 09/28/21 1637 09/29/21 1900   09/27/21 1500  cefTRIAXone (ROCEPHIN) 1 g in sodium chloride 0.9 % 100 mL IVPB        1 g 200 mL/hr over 30 Minutes Intravenous  Once 09/27/21 1449 09/27/21 1830       Subjective: Seen and examined at bedside and she walked with therapy.  Continues to have some pain in her leg and states that she is also having pain in her ribs from deep inspiration.  No nausea or vomiting.  States that she is hungry did not get breakfast given that she is walking with therapy.  Denies any lightheadedness or dizziness currently but has not had any orthostatic vital signs done.  No other  concerns or complaints at this time  Objective: Vitals:   09/30/21 2045 10/01/21 0425 10/01/21 0806 10/01/21 1330  BP: (!) 167/68 (!) 181/79 (!) 149/53 (!) 159/76  Pulse:  62 60 (!) 50  Resp:  '18 17 15  '$ Temp:  98.7 F (37.1 C) 98.7 F (37.1 C) 97.7 F (36.5 C)  TempSrc:   Oral Oral  SpO2:  91% 93% 99%  Weight:      Height:        Intake/Output Summary (Last 24 hours) at 10/01/2021 1744 Last data filed at 10/01/2021 1500 Gross per 24 hour  Intake 243 ml  Output --  Net 243 ml   Filed Weights   09/27/21 1102 09/28/21 1801  Weight: 59.9 kg 59.9 kg   Examination: Physical Exam:  Constitutional: Thin Caucasian female currently no acute distress appears a little frustrated not getting her breakfast Respiratory: Diminished to auscultation bilaterally with coarse breath sounds, no wheezing, rales, rhonchi or crackles. Normal respiratory effort and patient is not tachypenic. No accessory muscle use.  Unlabored breathing Cardiovascular: RRR, no murmurs / rubs / gallops. S1 and S2 auscultated.  Right leg is in a knee immobilizer Abdomen: Soft, non-tender, non-distended.  Bowel sounds positive.  GU: Deferred. Musculoskeletal: No clubbing / cyanosis of digits/nails. No joint deformity upper and lower extremities.  Skin: Has some bruising noted on her right elbow from her fall Neurologic: CN 2-12 grossly intact with no focal deficits.  Romberg sign cerebellar reflexes not assessed.  Psychiatric: Normal judgment and insight. Alert and oriented x 2 but does get a little mildly confused. Normal mood and appropriate affect.   Data Reviewed: I have personally reviewed following labs and imaging studies  CBC: Recent Labs  Lab 09/27/21 1122 09/28/21 1621 09/29/21 0216 09/30/21 0237 10/01/21 0200  WBC 5.2 5.4 6.0 6.4 7.2  NEUTROABS 3.1  --   --  3.3 4.1  HGB 12.3 13.2 12.1 11.9* 11.8*  HCT 37.3 39.7 35.9* 35.3* 35.3*  MCV 96.6 95.7 95.2 93.9 94.6  PLT 248 240 251 258 202   Basic  Metabolic Panel: Recent Labs  Lab 09/24/21 2016 09/27/21 1122 09/28/21 1621 09/29/21 0216 09/30/21 0237 10/01/21 0200  NA 132* 135  --  136 136 134*  K 3.6 4.5  --  4.1 3.7 3.9  CL 98 102  --  99 100 96*  CO2 28 26  --  '25 24 24  '$ GLUCOSE 232*  82  --  105* 86 81  BUN 21 18  --  '17 14 18  '$ CREATININE 0.80 0.62 0.72 0.78 0.74 0.84  CALCIUM 8.8* 9.1  --  9.0 9.1 9.3  MG  --   --   --   --  2.1 1.9  PHOS  --   --   --   --  3.4 3.3   GFR: Estimated Creatinine Clearance: 47.1 mL/min (by C-G formula based on SCr of 0.84 mg/dL). Liver Function Tests: Recent Labs  Lab 09/27/21 1300 09/29/21 0216 09/30/21 0237 10/01/21 0200  AST '23 19 17 19  '$ ALT '18 15 15 14  '$ ALKPHOS 93 89 90 98  BILITOT 0.9 0.6 0.6 0.8  PROT 6.8 5.9* 5.9* 6.1*  ALBUMIN 4.0 3.3* 3.3* 3.3*   Recent Labs  Lab 09/27/21 1300  LIPASE 26   No results for input(s): "AMMONIA" in the last 168 hours. Coagulation Profile: Recent Labs  Lab 09/27/21 1300  INR 1.0   Cardiac Enzymes: No results for input(s): "CKTOTAL", "CKMB", "CKMBINDEX", "TROPONINI" in the last 168 hours. BNP (last 3 results) No results for input(s): "PROBNP" in the last 8760 hours. HbA1C: No results for input(s): "HGBA1C" in the last 72 hours. CBG: Recent Labs  Lab 09/29/21 0739 10/01/21 0902 10/01/21 1154 10/01/21 1630  GLUCAP 109* 113* 152* 116*   Lipid Profile: No results for input(s): "CHOL", "HDL", "LDLCALC", "TRIG", "CHOLHDL", "LDLDIRECT" in the last 72 hours. Thyroid Function Tests: No results for input(s): "TSH", "T4TOTAL", "FREET4", "T3FREE", "THYROIDAB" in the last 72 hours. Anemia Panel: No results for input(s): "VITAMINB12", "FOLATE", "FERRITIN", "TIBC", "IRON", "RETICCTPCT" in the last 72 hours. Sepsis Labs: No results for input(s): "PROCALCITON", "LATICACIDVEN" in the last 168 hours.  Recent Results (from the past 240 hour(s))  Urine Culture     Status: Abnormal   Collection Time: 09/27/21  1:00 PM   Specimen: Urine,  Clean Catch  Result Value Ref Range Status   Specimen Description   Final    URINE, CLEAN CATCH Performed at [facility], [address] [place], [place] [address]    Special Requests   Final    NONE Performed at [facility], [address] [place], [place] [address]    Culture (A)  Final    <10,000 COLONIES/mL INSIGNIFICANT GROWTH Performed at [facility], [address], [place], [place] [address]    Report Status 09/28/2021 FINAL  Final     Radiology Studies: MR KNEE RIGHT WO CONTRAST  Result Date: 09/30/2021 CLINICAL DATA:  Right knee pain. Patellar fracture. Assess medial tibial plateau EXAM: MRI OF THE RIGHT KNEE WITHOUT CONTRAST TECHNIQUE: Multiplanar, multisequence MR imaging of the knee was performed. No intravenous contrast was administered. COMPARISON:  X-ray and CT from 09/27/2021 FINDINGS: Technical Note: Despite efforts by the technologist and patient, motion artifact is present on today's exam and could not be eliminated. This reduces exam sensitivity and specificity. MENISCI Medial meniscus:  Intrasubstance degeneration without discrete tear. Lateral meniscus: Intrasubstance degeneration without discrete tear. LIGAMENTS Cruciates: Intact ACL and PCL. Collaterals: Intact MCL. Lateral collateral ligament complex intact. CARTILAGE Patellofemoral: Focal partial-thickness chondral fissure of the lateral patellar facet (series 10, image 15). No trochlear chondral defect. Medial:  Mild chondral thinning without focal defect. Lateral:  No chondral defect. MISCELLANEOUS Joint: Small joint effusion. No layering hematocrit level. Soft tissue edema within the superior aspect of Hoffa's fat. Popliteal Fossa:  No Baker's cyst. Intact popliteus tendon. Extensor Mechanism:  Intact quadriceps and patellar tendons. Thickening of the proximal patellar tendon. Bones: Acute nondisplaced transversely oriented fracture through the inferior pole of the patella without  extension to the articular surface of the patella. Mild bone marrow edema within the anterior aspect of the lateral tibial plateau without associated fracture. There is no fracture or contusion involving the medial tibial plateau, as questioned on previous studies. No marrow replacing bone lesion. Other: No significant periarticular soft tissue findings. IMPRESSION: 1. There is no fracture or contusion involving the medial tibial plateau, as questioned on previous studies. 2. Acute nondisplaced transversely oriented fracture through the inferior pole of the patella. 3. Mild bone contusion involving the anterior aspect of the lateral tibial plateau without associated fracture. 4. Intrasubstance degeneration of the menisci without discrete tear. 5. Mild medial and patellofemoral compartment chondral irregularities, as above. 6. Small joint effusion. 7. Intact cruciate and collateral ligaments. Electronically Signed   By: Davina Poke D.O.   On: 09/30/2021 12:27     Scheduled Meds:  citalopram  30 mg Oral Daily   enoxaparin (LOVENOX) injection  40 mg Subcutaneous Q24H   gabapentin  100 mg Oral TID   hydrALAZINE  50 mg Oral BID   lamoTRIgine  150 mg Oral TID   latanoprost  1 drop Both Eyes QHS   losartan  100 mg Oral Daily   metoprolol succinate  50 mg Oral Daily   sodium chloride flush  3 mL Intravenous Q12H   timolol  1 drop Both Eyes q morning   Continuous Infusions:   LOS: 2 days   Raiford Noble, DO [facility] Available via Epic secure chat 7am-7pm After these hours, please refer to coverage provider listed on [email or web address] 10/01/2021, 5:44 PM

## 2021-10-01 NOTE — TOC Initial Note (Signed)
Transition of Care [facility]) - Initial/Assessment Note    Patient Details  Name: Crystal Huber MRN: [id number] Date of Birth: 1945/09/16  Transition of Care [facility]) CM/SW Contact:    Bartholomew Crews, RN Phone Number: [phone] 10/01/2021, 1:02 PM  Clinical Narrative:                  Spoke with patient at the bedside to discuss post acute transition. PTA home alone, but does have a roommate who works as Quarry manager and is not home very often. She has RW at home. She does not drive.   Patient stated that she will be able to arrange transportation home for discharge.   Discussed HH recommendations for PT/OT. Patient in agreement. Offered choice of [place] agency. Patient only concern is that insurance authorizes service. Referral accepted by [facility]. Patient will need HH Face to Face order for PT/OT.   TOC following for transition needs.   Expected Discharge Plan: [place] Barriers to Discharge: Continued Medical Work up   Patient Goals and CMS Choice Patient states their goals for this hospitalization and ongoing recovery are:: return home CMS Medicare.gov Compare Post Acute Care list provided to:: Patient Choice offered to / list presented to : Patient  Expected Discharge Plan and Services Expected Discharge Plan: [place] In-house Referral: Clinical Social Work Discharge Planning Services: CM Consult Post Acute Care Choice: [place] arrangements for the past 2 months: Single Family Home                 DME Arranged: N/A DME Agency: NA       HH Arranged: PT, OT [place] Agency: Administrator (now known as Lobbyist) Date [place]: 10/01/21 Time [place]: 63 Representative spoke with at [place]: [place] Arrangements/Services Living arrangements for the past 2 months: [place] Lives with:: Roommate Patient language and need for interpreter reviewed:: Yes Do you feel safe going back to the  place where you live?: Yes      Need for Family Participation in Patient Care: Yes (Comment) Care giver support system in place?: Yes (comment) Current home services: DME (RW) Criminal Activity/Legal Involvement Pertinent to Current Situation/Hospitalization: No - Comment as needed  Activities of Daily Living Home Assistive Devices/Equipment: None ADL Screening (condition at time of admission) Patient's cognitive ability adequate to safely complete daily activities?: Yes Is the patient deaf or have difficulty hearing?: No Does the patient have difficulty seeing, even when wearing glasses/contacts?: No Does the patient have difficulty concentrating, remembering, or making decisions?: No Patient able to express need for assistance with ADLs?: No Does the patient have difficulty dressing or bathing?: No Independently performs ADLs?: Yes (appropriate for developmental age) Does the patient have difficulty walking or climbing stairs?: Yes Weakness of Legs: Right Weakness of Arms/Hands: None  Permission Sought/Granted                  Emotional Assessment Appearance:: Appears stated age Attitude/Demeanor/Rapport: Engaged Affect (typically observed): Accepting Orientation: : Oriented to Self, Oriented to Place, Oriented to  Time, Oriented to Situation Alcohol / Substance Use: Not Applicable Psych Involvement: No (comment)  Admission diagnosis:  Acute cystitis without hematuria [N30.00] Multiple rib fractures [S22.49XA] Multiple falls [R29.6] Closed fracture of multiple ribs of right side, initial encounter [S22.41XA] Closed nondisplaced fracture of right patella, unspecified fracture morphology, initial encounter [S82.001A] Tibial fracture [S82.209A] Patient Active Problem List   Diagnosis  Date Noted   Tibial fracture 09/28/2021   Glaucoma 09/28/2021   Refusal of blood product 09/28/2021   Acute cystitis without hematuria    Closed nondisplaced fracture of right patella     Multiple rib fractures 09/27/2021   Gastroesophageal reflux disease without esophagitis 09/26/2020   Primary hypertension 09/26/2020   Tremor 11/19/2017   Vertigo 05/19/2017   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   Gait disorder 09/11/2015   Urinary frequency 07/14/2014   Trigeminal neuralgia 05/16/2014   Ingrown nail 12/13/2013   Onychomycosis 12/13/2013   Pain in lower limb 12/13/2013   Multiple sclerosis ([place]) 09/12/2013   Fatigue 09/12/2013   LBP (low back pain) 09/12/2013   Cannot sleep 09/12/2013   Cephalalgia 09/12/2013   Anxiety disorder 09/12/2013   Arthralgia of shoulder 09/12/2013   Extremity pain 09/12/2013   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 [place] [address] Phone: [phone] Fax: [phone]     Social Determinants of Health (SDOH) Interventions    Readmission Risk Interventions     No data to display

## 2021-10-01 NOTE — Discharge Instructions (Signed)
Ok to weight bear as tolerated on right leg. Please keep knee immobilizer in place at all times until you follow up with Dr. Percell Miller.

## 2021-10-01 NOTE — Plan of Care (Signed)

## 2021-10-01 NOTE — Progress Notes (Signed)
Inpatient Rehab Admissions Coordinator:   Note therapy recs updated to [facility] per pt request.  Will not be able to get [facility] Medicare approval with [facility] recs, so will sign off for CIR at this time.    Shann Medal, PT, DPT Admissions Coordinator [phone] 10/01/21  12:45 PM

## 2021-10-01 NOTE — Progress Notes (Addendum)
Physical Therapy Treatment Patient Details Name: Crystal Huber MRN: [id number] DOB: 11-29-1945 Today's Date: 10/01/2021   History of Present Illness Pt is a 76 y/o F presenting to ED on 7/13 after fall, workup revealing R patellar fx, possible nondisplaced tibial plateau fracture on right, and multiple rib fxs (3-9 on right side). PMH includes arthritis, breast CA, GERD, HTN, HLD, RRMS, trigeminal neuralgia, and vertigo.    PT Comments    Pt instructed in and performed gait training and therapeutic activities. Pt with great progress mostly due to her WB status changing to WBAT. Pt able to ambulate 100 feet with RW and mostly CGA. Pt states she would like to go home now and will see if she can find someone to help her out as her roommate works. Pt provided with gait belt. Pt will continue to benefit from skilled, acute care physical therapy interventions to maximize current level of function and progress towards established goals which have been updated.   Recommendations for follow up therapy are one component of a multi-disciplinary discharge planning process, led by the attending physician.  Recommendations may be updated based on patient status, additional functional criteria and insurance authorization.  Follow Up Recommendations  Home health PT (pt wants to go home vs IPR)     Assistance Recommended at Discharge Intermittent Supervision/Assistance  Patient can return home with the following Assist for transportation;Assistance with cooking/housework;A little help with walking and/or transfers;A little help with bathing/dressing/bathroom   Equipment Recommendations  None recommended by PT    Recommendations for Other Services       Precautions / Restrictions Precautions Precautions: Fall Required Braces or Orthoses: Knee Immobilizer - Right Knee Immobilizer - Right: On at all times Restrictions Weight Bearing Restrictions: Yes RLE Weight Bearing: Weight bearing as tolerated      Mobility  Bed Mobility Overal bed mobility: Needs Assistance Bed Mobility: Supine to Sit     Supine to sit: Supervision          Transfers Overall transfer level: Needs assistance Equipment used: Rolling walker (2 wheels) Transfers: Sit to/from Stand Sit to Stand: Mod assist, Min guard, Supervision           General transfer comment: Pt required 2 attempts and moderate assistance to stand from bed due to difficulty bringing R LE with KI back to get underneath her. However, pt performed 5 consecutive sit <> stands from chair using B UE's after gait and was able to perform with mostly standby A (CGA utilized when unsteadiness present). Increased cues required for technique. Pt states she has adjustable bed which will help with her sit to stands.    Ambulation/Gait Ambulation/Gait assistance: Min guard, Min assist Gait Distance (Feet): 100 Feet Assistive device: Rolling walker (2 wheels) Gait Pattern/deviations: Step-to pattern, Step-through pattern, Antalgic       General Gait Details: Pt instructed in and performed modified three point pattern. CGA utilized but min assist required x 1 at [facility] for direction in hallway. Pt able to progress to step through pattern.   Stairs             Wheelchair Mobility    Modified Rankin (Stroke Patients Only)       Balance Overall balance assessment: Needs assistance Sitting-balance support: Feet supported, Bilateral upper extremity supported Sitting balance-Leahy Scale: Fair     Standing balance support: Bilateral upper extremity supported, During functional activity, Reliant on assistive device for balance Standing balance-Leahy Scale: Poor Standing balance comment: reliant on external support  Cognition Arousal/Alertness: Awake/alert Behavior During Therapy: WFL for tasks assessed/performed Overall Cognitive Status: Impaired/Different from baseline Area of Impairment:  Attention, Memory, Following commands, Safety/judgement, Problem solving, Awareness                     Memory: Decreased recall of precautions, Decreased short-term memory Following Commands: Follows one step commands inconsistently, Follows one step commands with increased time Safety/Judgement: Decreased awareness of safety, Decreased awareness of deficits Awareness: Emergent, Intellectual Problem Solving: Requires tactile cues, Requires verbal cues, Decreased initiation, Difficulty sequencing General Comments: Pt A and O x 4. Pt reports some cognitive deficits at baseline due to MS but unable to determine her true baseline.        Exercises      General Comments General comments (skin integrity, edema, etc.): BP at 175/82 (pt asymptomatic)      Pertinent Vitals/Pain Pain Assessment Pain Assessment: 0-10 Pain Score: 0-No pain (at rest) Pain Location: R knee Pain Intervention(s): Limited activity within patient's tolerance, Monitored during session    Home Living                          Prior Function            PT Goals (current goals can now be found in the care plan section) Acute Rehab PT Goals Patient Stated Goal: none stated PT Goal Formulation: With patient Time For Goal Achievement: 10/13/21 Potential to Achieve Goals: Good Progress towards PT goals: Progressing toward goals    Frequency    Min 5X/week      PT Plan Current plan remains appropriate    Co-evaluation              AM-PAC PT "6 Clicks" Mobility   Outcome Measure  Help needed turning from your back to your side while in a flat bed without using bedrails?: A Little Help needed moving from lying on your back to sitting on the side of a flat bed without using bedrails?: A Little Help needed moving to and from a bed to a chair (including a wheelchair)?: A Lot Help needed standing up from a chair using your arms (e.g., wheelchair or bedside chair)?: A Little Help  needed to walk in hospital room?: A Little Help needed climbing 3-5 steps with a railing? : A Little 6 Click Score: 17    End of Session Equipment Utilized During Treatment: Gait belt;Right knee immobilizer Activity Tolerance: Patient tolerated treatment well Patient left: in chair;with call bell/phone within reach;with chair alarm set Nurse Communication: Mobility status;Weight bearing status PT Visit Diagnosis: Other abnormalities of gait and mobility (R26.89);Repeated falls (R29.6);Muscle weakness (generalized) (M62.81)     Time: 5573-2202 PT Time Calculation (min) (ACUTE ONLY): 26 min  Charges:  $Gait Training: 8-22 mins $Therapeutic Activity: 8-22 mins                     Donna Bernard, PT    [facility] 10/01/2021, 9:34 AM

## 2021-10-01 NOTE — Plan of Care (Signed)

## 2021-10-02 DIAGNOSIS — G35 Multiple sclerosis: Secondary | ICD-10-CM | POA: Diagnosis not present

## 2021-10-02 DIAGNOSIS — S82001A Unspecified fracture of right patella, initial encounter for closed fracture: Secondary | ICD-10-CM | POA: Diagnosis not present

## 2021-10-02 DIAGNOSIS — N3000 Acute cystitis without hematuria: Secondary | ICD-10-CM | POA: Diagnosis not present

## 2021-10-02 DIAGNOSIS — E7800 Pure hypercholesterolemia, unspecified: Secondary | ICD-10-CM | POA: Diagnosis not present

## 2021-10-02 LAB — CBC WITH DIFFERENTIAL/PLATELET
Abs Immature Granulocytes: 0.04 10*3/uL (ref 0.00–0.07)
Basophils Absolute: 0.1 10*3/uL (ref 0.0–0.1)
Basophils Relative: 1 %
Eosinophils Absolute: 0.3 10*3/uL (ref 0.0–0.5)
Eosinophils Relative: 5 %
HCT: 38.1 % (ref 36.0–46.0)
Hemoglobin: 12.8 g/dL (ref 12.0–15.0)
Immature Granulocytes: 1 %
Lymphocytes Relative: 27 %
Lymphs Abs: 1.8 10*3/uL (ref 0.7–4.0)
MCH: 31.8 pg (ref 26.0–34.0)
MCHC: 33.6 g/dL (ref 30.0–36.0)
MCV: 94.8 fL (ref 80.0–100.0)
Monocytes Absolute: 1.1 10*3/uL — ABNORMAL HIGH (ref 0.1–1.0)
Monocytes Relative: 17 %
Neutro Abs: 3.3 10*3/uL (ref 1.7–7.7)
Neutrophils Relative %: 49 %
Platelets: 289 10*3/uL (ref 150–400)
RBC: 4.02 MIL/uL (ref 3.87–5.11)
RDW: 13.3 % (ref 11.5–15.5)
WBC: 6.6 10*3/uL (ref 4.0–10.5)
nRBC: 0 % (ref 0.0–0.2)

## 2021-10-02 LAB — COMPREHENSIVE METABOLIC PANEL
ALT: 21 U/L (ref 0–44)
AST: 28 U/L (ref 15–41)
Albumin: 3.6 g/dL (ref 3.5–5.0)
Alkaline Phosphatase: 106 U/L (ref 38–126)
Anion gap: 5 (ref 5–15)
BUN: 14 mg/dL (ref 8–23)
CO2: 28 mmol/L (ref 22–32)
Calcium: 9.2 mg/dL (ref 8.9–10.3)
Chloride: 99 mmol/L (ref 98–111)
Creatinine, Ser: 0.76 mg/dL (ref 0.44–1.00)
GFR, Estimated: >60 mL/min (ref >60)
Glucose, Bld: 98 mg/dL (ref 70–99)
Potassium: 3.9 mmol/L (ref 3.5–5.1)
Sodium: 132 mmol/L — ABNORMAL LOW (ref 135–145)
Total Bilirubin: 0.6 mg/dL (ref 0.3–1.2)
Total Protein: 6.4 g/dL — ABNORMAL LOW (ref 6.5–8.1)

## 2021-10-02 LAB — PHOSPHORUS: Phosphorus: 3 mg/dL (ref 2.5–4.6)

## 2021-10-02 LAB — GLUCOSE, CAPILLARY
Glucose-Capillary: 111 mg/dL — ABNORMAL HIGH (ref 70–99)
Glucose-Capillary: 113 mg/dL — ABNORMAL HIGH (ref 70–99)

## 2021-10-02 LAB — MAGNESIUM: Magnesium: 2.1 mg/dL (ref 1.7–2.4)

## 2021-10-02 MED ORDER — OXYCODONE HCL 5 MG PO TABS: 5.0000 mg | ORAL_TABLET | Freq: Four times a day (QID) | ORAL | 0 refills | Status: DC | PRN

## 2021-10-02 MED ORDER — SALINE SPRAY 0.65 % NA SOLN: 1.0000 | NASAL | 0 refills | Status: DC | PRN

## 2021-10-02 MED ORDER — SODIUM CHLORIDE 0.9 % IV SOLN: INTRAVENOUS | Status: DC

## 2021-10-02 MED ORDER — SODIUM CHLORIDE 0.9 % IV BOLUS
500.0000 mL | Freq: Once | INTRAVENOUS | Status: AC
  Administered 2021-10-02: 500 mL via INTRAVENOUS

## 2021-10-02 MED ORDER — DIPHENHYDRAMINE-ZINC ACETATE 2-0.1 % EX CREA
TOPICAL_CREAM | Freq: Three times a day (TID) | CUTANEOUS | 0 refills | Status: DC | PRN
Start: 2021-10-02 — End: 2022-01-14

## 2021-10-02 MED ORDER — LIP MEDEX EX OINT: 1.0000 | TOPICAL_OINTMENT | CUTANEOUS | 0 refills | Status: DC | PRN

## 2021-10-02 MED ORDER — ONDANSETRON HCL 4 MG PO TABS: 4.0000 mg | ORAL_TABLET | Freq: Four times a day (QID) | ORAL | 0 refills | Status: DC | PRN

## 2021-10-02 MED ORDER — ACETAMINOPHEN 325 MG PO TABS: 650.0000 mg | ORAL_TABLET | Freq: Four times a day (QID) | ORAL | 0 refills | Status: DC | PRN

## 2021-10-02 NOTE — Progress Notes (Signed)
Pt asked for assistance to the bathroom. Knee immobilizer was applied along with non-skid socks. Pt was able to stand with x1 person assistance and walked to the restroom with her walker with no complications or pain when doing so.

## 2021-10-02 NOTE — Progress Notes (Signed)
Occupational Therapy Treatment Patient Details Name: Crystal Huber MRN: [id number] DOB: [birth year] Today's Date: 10/02/2021   History of present illness Pt is a 76 y/o F presenting to ED on 7/13 after fall, workup revealing R patellar fx, possible nondisplaced tibial plateau fracture on right, and multiple rib fxs (3-9 on right side). PMH includes arthritis, breast CA, GERD, HTN, HLD, RRMS, trigeminal neuralgia, and vertigo.   OT comments  Pt progressing towards goals, now WBAT per new MRI. Pt needing min guard-min A for ADLs, supervision for bed mobility, and min guard for transfers with RW. Pt educated on use of sock aid/reacher for ADLs given R knee immobilizer. Pt presenting with impairments listed below, will follow acutely. Updating d/c recommendation to [place].   Recommendations for follow up therapy are one component of a multi-disciplinary discharge planning process, led by the attending physician.  Recommendations may be updated based on patient status, additional functional criteria and insurance authorization.    Follow Up Recommendations  Home health OT    Assistance Recommended at Discharge Intermittent Supervision/Assistance  Patient can return home with the following  A lot of help with walking and/or transfers;A lot of help with bathing/dressing/bathroom;Assistance with cooking/housework;Direct supervision/assist for medications management;Direct supervision/assist for financial management;Assist for transportation;Help with stairs or ramp for entrance   Equipment Recommendations  Other (comment) (sock aid, reacher)    Recommendations for Other Services PT consult    Precautions / Restrictions Precautions Precautions: Fall Required Braces or Orthoses: Knee Immobilizer - Right Knee Immobilizer - Right: On at all times Restrictions Weight Bearing Restrictions: No RLE Weight Bearing: Weight bearing as tolerated       Mobility Bed Mobility Overal bed mobility: Needs  Assistance Bed Mobility: Supine to Sit     Supine to sit: Supervision     General bed mobility comments: increased time    Transfers Overall transfer level: Needs assistance Equipment used: Rolling walker (2 wheels) Transfers: Sit to/from Stand Sit to Stand: Min guard           General transfer comment: Cues for safety/technique     Balance Overall balance assessment: Needs assistance Sitting-balance support: Feet supported, Bilateral upper extremity supported Sitting balance-Leahy Scale: Fair     Standing balance support: Bilateral upper extremity supported, During functional activity, Reliant on assistive device for balance, No upper extremity supported, Single extremity supported Standing balance-Leahy Scale: Fair Standing balance comment: reliant on external support                           ADL either performed or assessed with clinical judgement   ADL Overall ADL's : Needs assistance/impaired     Grooming: Min guard;Standing;Wash/dry hands Grooming Details (indicate cue type and reason): standing at sink             Lower Body Dressing: Minimal assistance;With adaptive equipment;Cueing for compensatory techniques;Sitting/lateral leans Lower Body Dressing Details (indicate cue type and reason): using sock aid/reacher Toilet Transfer: Min guard;Rolling walker (2 wheels);Ambulation;Regular Museum/gallery exhibitions officer and Hygiene: Supervision/safety Toileting - Clothing Manipulation Details (indicate cue type and reason): for pericare     Functional mobility during ADLs: Min guard;Rolling walker (2 wheels)      Extremity/Trunk Assessment Upper Extremity Assessment Upper Extremity Assessment: Generalized weakness   Lower Extremity Assessment Lower Extremity Assessment: Defer to PT evaluation        Vision   Vision Assessment?: No apparent visual deficits   Perception Perception Perception:  Not tested   Praxis  Praxis Praxis: Not tested    Cognition Arousal/Alertness: Awake/alert Behavior During Therapy: WFL for tasks assessed/performed Overall Cognitive Status: Impaired/Different from baseline Area of Impairment: Attention, Memory, Following commands, Safety/judgement, Problem solving, Awareness                   Current Attention Level: Focused Memory: Decreased recall of precautions, Decreased short-term memory Following Commands: Follows one step commands inconsistently, Follows one step commands with increased time Safety/Judgement: Decreased awareness of safety, Decreased awareness of deficits Awareness: Emergent, Intellectual Problem Solving: Requires tactile cues, Requires verbal cues, Decreased initiation, Difficulty sequencing          Exercises      Shoulder Instructions       General Comments VSS on RA    Pertinent Vitals/ Pain       Pain Assessment Pain Assessment: Faces Pain Score: 2  Faces Pain Scale: Hurts a little bit Pain Location: R knee Pain Descriptors / Indicators: Discomfort Pain Intervention(s): Limited activity within patient's tolerance, Monitored during session, Repositioned  Home Living                                          Prior Functioning/Environment              Frequency  Min 2X/week        Progress Toward Goals  OT Goals(current goals can now be found in the care plan section)  Progress towards OT goals: Progressing toward goals  Acute Rehab OT Goals Patient Stated Goal: to get better OT Goal Formulation: With patient Time For Goal Achievement: 10/14/21 Potential to Achieve Goals: Good ADL Goals Pt Will Perform Upper Body Dressing: with supervision;sitting;standing Pt Will Perform Lower Body Dressing: with min assist;with adaptive equipment;sit to/from stand;sitting/lateral leans Pt Will Transfer to Toilet: with min assist;with +2 assist;bedside commode;stand pivot transfer Additional ADL Goal  #1: pt will complete bed mobility mod I in prep for ADLs  Plan Frequency remains appropriate;Discharge plan needs to be updated    Co-evaluation                 AM-PAC OT "6 Clicks" Daily Activity     Outcome Measure   Help from another person eating meals?: None Help from another person taking care of personal grooming?: A Little Help from another person toileting, which includes using toliet, bedpan, or urinal?: A Little Help from another person bathing (including washing, rinsing, drying)?: A Lot Help from another person to put on and taking off regular upper body clothing?: A Little Help from another person to put on and taking off regular lower body clothing?: A Little 6 Click Score: 18    End of Session Equipment Utilized During Treatment: Gait belt;Rolling walker (2 wheels);Right knee immobilizer  OT Visit Diagnosis: Unsteadiness on feet (R26.81);Other abnormalities of gait and mobility (R26.89);Muscle weakness (generalized) (M62.81);History of falling (Z91.81);Other symptoms and signs involving cognitive function   Activity Tolerance Patient tolerated treatment well   Patient Left in chair;with call bell/phone within reach;with chair alarm set   Nurse Communication Mobility status;Weight bearing status        Time: 7858-8502 OT Time Calculation (min): 28 min  Charges: OT General Charges $OT Visit: 1 Visit OT Treatments $Self Care/Home Management : 23-37 mins  Lynnda Child, OTD, OTR/L Acute Rehab [phone]   Kaylyn Lim  10/02/2021, 12:32 PM

## 2021-10-02 NOTE — Plan of Care (Signed)
  Problem: Activity: Goal: Risk for activity intolerance will decrease Outcome: Progressing   Problem: Nutrition: Goal: Adequate nutrition will be maintained Outcome: Progressing   Problem: Elimination: Goal: Will not experience complications related to bowel motility Outcome: Progressing   

## 2021-10-02 NOTE — Progress Notes (Signed)
PROGRESS NOTE    Crystal Huber  CXK:[id number] DOB: 1945/05/27 DOA: 09/27/2021 PCP: [name], NP   Brief Narrative:  HPI per Dr. Gilles Chiquito on 09/28/21  Crystal Huber is a 76 y.o. female with medical history significant of arthritis, breast cancer (h/o), GERD, HLD, HTN, MS (relapsing remitting), trigeminal neuralgia and vertigo who presented for falls and urinary symptoms.  She notes a fall about 2 days ago where she was using her walker and moving in her home.  She noted that she got dizzy and felt herself falling.  She fell into a dresser and a chair and then she presented to the hospital.  She has had worsening falls in the last few months, and specifically 6 falls in the last week.  She was seen in the ED on 7/10 and found to have rib fractures.  She has a history of MS, has never had an enclosed MRI per her report, only open MRIs in the past due to severe claustrophobia.  She further notes symptoms of UTI including burning with urination, difficulty with emptying her bladder and abdominal pain.  She feels like she has a UTI.  Her MS was initially present with gait abnormality.  Given her knee immobilizer in place, gait was not tested.  She notes being told that she has thin bones by her PCP, but never being placed on treatment for this.  She has been trying to remember to take calcium.  She currently denies any pain, SOB.  She notes no weakness, but feels that her dizziness is what caused her to fall.  She takes meclizine for this at home.    ED Course: In the ED, she was found to have relatively normal blood work, normal WBC.  UA showed moderate WBC, nitrite negative.  UC showed insignificant growth.  She has received 1 dose of rocephin.  CT head showed no acute abnormality, knee xray showed patellar fracture, CT chest showed multiple acute and subacute rib fractures, lungs clear with no PTX, CT knee showed non displaced patellar fracture and subtle possible fracture of the tibia.  Dr.  Grandville Silos in surgery discussed this case with the EDP.    **Interim History Orthopedic surgery evaluated for her right nondisplaced patella fracture with possible nondisplaced tibial plateau fracture and they recommended getting an MRI of the right knee.  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  Orthopedic surgery recommended weightbearing as tolerated and being in the knee immobilizer at all times.  They recommended mobilizing with PT and are going to treat this nonoperatively.  PT OT evaluated and recommending CIR.  We will consult CIR for rehab assessment and evaluation and CR was evaluating but then PT evaluated and recommending home health now given patient wanting to go home.  CIR has now signed off the case.  Will need to repeat orthostatic vital signs and ensure the patient can safely go home likely discharge in the next 24 to 48 hours.  Unfortunately the patient remains intermittently confused and TOC Team spoke with friend Crystal Huber who states pt doesn't have the help or supervision needed for a safe d/c. Friend is trying to convince pt to go to IPR or SNF to get stronger ST but she is not a CIR candidate so she will need to go to SNF   Assessment and Plan:  Multiple rib fractures Vertigo Nondisplaced acute patella fracture Tibial  fracture ruled out by MRI - Knee immobilizer in place - Surgical Telemetry - Pain control with tylenol and oxycodone PRN (she notes no pain currently) - Orthopedics, Dr. Doreatha Martin, was contacted (listed on treatment team) to let their team know she has arrived at [facility] and I spoke with Dr. Percell Miller today and they will see the patient in the morning for further recommendations -Orthopedic surgery evaluated and recommending keeping her in a longer knee immobilizer given that she was able to bend her knee and discussed the need for an MRI to better see the  possible tibial plateau fracture.  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 Percell Miller in 1 to 2 weeks - PT/OT recommending CIR initially and now recommended home health PT.  We will need to make arrangements for safe discharge disposition and TOC involved however she does not have a safe discharge disposition will need to go to SNF -We will check orthostatic vital signs for her vertigo and continue meclizine and repeat orthostatic vital signs are were done and showed that she did drop a little but she was not symptomatic   UTI, present on admission - Patient with symptoms, UA concerning - UC with insignificant growth - Received 1 dose of Rocephin, will plan to complete 3 days of therapy and has done and completed    Multiple sclerosis ([place]) - MRI brain on 08/28/21 - noted foci consistent with h/o MS.  No changes compared to 09/19/2016.  - Last visit with [name] on 08/14/21: Noted no new symptoms off of therapy for MS - MRI showed no new lesions off therapy - PT/OT as above, she has no new apparent deficits, but is falling more at home but now PT OT have changed her recommendations - Consider neurology consult if patient not progressing as expected, ? Need for new MRI and further MS treatment but will discuss with neurology in the morning if she is not improving and if she continues to be intermittently confused   Fragility Fracture -Dr. Daryll Drown could not find a DEXA scan on cursory review of Care Everywhere, appears to have been ordered, but do not see that it was done -Will need possible  bisphosphonate and this can be done in outpatient setting   Hyponatremia -Patient's sodium went from 136 -> 134 -> 132 -We will start gentle IV fluid hydration with normal saline normal saline at 75 mils per hour given her hyponatremia and was given a 500 mL bolus earlier this a.m. -Continue monitor trend and repeat CMP in a.m.   Normocytic Anemia -Patient's hemoglobin/hematocrit has gone from 13.2/39.7 -> 12.1/35.9 -> 11.9/35.3 -> 11.8/25.3 and is now 12.8/38.1 -Check anemia panel in the a.m. -Continue to monitor for signs and symptoms bleeding; no overt bleeding noted -Repeat CBC in a.m.   Hypoalbuminemia -Albumin level is 3.3 x3 and is now improved to 3.6 -Continue monitor and trend and repeat CMP in a.m.   Trigeminal Neuralgia - Continue home Lamotrigine 150 mg po TID and Gabapentin 100 mg po TID and Gabapentin 300-600 mg po 5 Times Daily     Primary Hypertension -Continue home Hydralazine 50 mg po BID, Losartan 100 mg  po Daily, and Metoprolol Succinate 50 mg po Daily  -Continue monitor blood pressures per protocol -Last BP reading was 135/67    Glaucoma -Continue home eye drops Latanoprost 1 drop in both eyes nightly and timolol 1 drop in both eyes every morning   Anxiety Disorder - Continue home Citalopram 30 mg po Daily  DVT prophylaxis: Place TED hose Start: 10/02/21 1422 enoxaparin (LOVENOX) injection 40 mg Start: 09/28/21 1800 SCDs Start: 09/28/21 1551    Code Status: Full Code Family Communication: No family currently at bedside  Disposition Plan:  Level of care: Telemetry Surgical Status is: Inpatient Remains inpatient appropriate because: Has a unsafe discharge disposition going home and will need SNF placement for rehabilitation and strength training   Consultants:  Orthopedic surgery CIR  Procedures:  None  Antimicrobials:  Anti-infectives (From admission, onward)    Start     Dose/Rate Route Frequency Ordered Stop   09/28/21 1730  cefTRIAXone  (ROCEPHIN) 1 g in sodium chloride 0.9 % 100 mL IVPB        1 g 200 mL/hr over 30 Minutes Intravenous Every 24 hours 09/28/21 1637 09/29/21 1900   09/27/21 1500  cefTRIAXone (ROCEPHIN) 1 g in sodium chloride 0.9 % 100 mL IVPB        1 g 200 mL/hr over 30 Minutes Intravenous  Once 09/27/21 1449 09/27/21 1830       Subjective: And examined at bedside and was slightly confused but answers questions appropriately.  Unfortunate does not have a safe discharge disposition to go home with home health.  Denies any chest pain or shortness breath.  Feels okay.  Wanting to bend her knee.  No other concerns or plaints at this time  Objective: Vitals:   10/01/21 2008 10/02/21 0819 10/02/21 1045 10/02/21 1441  BP: (!) 137/56 (!) 152/76 134/61 135/67  Pulse: (!) 53 (!) 58 (!) 58 (!) 53  Resp: '18 18  17  '$ Temp: (!) 97.5 F (36.4 C) 97.6 F (36.4 C)  97.8 F (36.6 C)  TempSrc: Oral Oral    SpO2: 96%   95%  Weight:      Height:        Intake/Output Summary (Last 24 hours) at 10/02/2021 1722 Last data filed at 10/02/2021 1500 Gross per 24 hour  Intake 723 ml  Output 2 ml  Net 721 ml   Filed Weights   09/27/21 1102 09/28/21 1801  Weight: 59.9 kg 59.9 kg   Examination: Physical Exam:  Constitutional: Thin Caucasian female currently no acute distress and appears calm Respiratory: Diminished to auscultation bilaterally, no wheezing, rales, rhonchi or crackles. Normal respiratory effort and patient is not tachypenic. No accessory muscle use.  Unlabored breathing Cardiovascular: RRR, no murmurs / rubs / gallops. S1 and S2 auscultated.  No appreciable extremity edema noted on the left Abdomen: Soft, non-tender, non-distended. Bowel sounds positive.  GU: Deferred. Musculoskeletal: No clubbing / cyanosis of digits/nails.  Right leg is in an immobilizer Skin: Has some bruising noted from her fall on her right elbow Neurologic: CN 2-12 grossly intact with no focal deficits. Romberg sign and cerebellar  reflexes not assessed.  Psychiatric: Impaired judgment and insight. Alert and oriented x 2.  She is slightly confused but has a normal mood and appropriate affect.   Data Reviewed: I have personally reviewed following labs and imaging studies  CBC: Recent Labs  Lab 09/27/21 1122 09/28/21 1621 09/29/21 0216 09/30/21 0237 10/01/21 0200 10/02/21 0253  WBC 5.2 5.4 6.0 6.4 7.2 6.6  NEUTROABS 3.1  --   --  3.3 4.1 3.3  HGB 12.3 13.2 12.1 11.9* 11.8* 12.8  HCT 37.3 39.7 35.9* 35.3* 35.3* 38.1  MCV 96.6 95.7 95.2 93.9 94.6 94.8  PLT 248 240 251 258 266 326   Basic Metabolic Panel: Recent Labs  Lab 09/27/21 1122 09/28/21 1621 09/29/21 0216 09/30/21 0237 10/01/21 0200 10/02/21 0253  NA 135  --  136 136 134* 132*  K 4.5  --  4.1 3.7 3.9 3.9  CL 102  --  99 100 96* 99  CO2 26  --  '25 24 24 28  '$ GLUCOSE 82  --  105* 86 81 98  BUN 18  --  '17 14 18 14  '$ CREATININE 0.62 0.72 0.78 0.74 0.84 0.76  CALCIUM 9.1  --  9.0 9.1 9.3 9.2  MG  --   --   --  2.1 1.9 2.1  PHOS  --   --   --  3.4 3.3 3.0   GFR: Estimated Creatinine Clearance: 49.5 mL/min (by C-G formula based on SCr of 0.76 mg/dL). Liver Function Tests: Recent Labs  Lab 09/27/21 1300 09/29/21 0216 09/30/21 0237 10/01/21 0200 10/02/21 0253  AST '23 19 17 19 28  '$ ALT '18 15 15 14 21  '$ ALKPHOS 93 89 90 98 106  BILITOT 0.9 0.6 0.6 0.8 0.6  PROT 6.8 5.9* 5.9* 6.1* 6.4*  ALBUMIN 4.0 3.3* 3.3* 3.3* 3.6   Recent Labs  Lab 09/27/21 1300  LIPASE 26   No results for input(s): "AMMONIA" in the last 168 hours. Coagulation Profile: Recent Labs  Lab 09/27/21 1300  INR 1.0   Cardiac Enzymes: No results for input(s): "CKTOTAL", "CKMB", "CKMBINDEX", "TROPONINI" in the last 168 hours. BNP (last 3 results) No results for input(s): "PROBNP" in the last 8760 hours. HbA1C: No results for input(s): "HGBA1C" in the last 72 hours. CBG: Recent Labs  Lab 10/01/21 0902 10/01/21 1154 10/01/21 1630 10/02/21 0813 10/02/21 1151   GLUCAP 113* 152* 116* 111* 113*   Lipid Profile: No results for input(s): "CHOL", "HDL", "LDLCALC", "TRIG", "CHOLHDL", "LDLDIRECT" in the last 72 hours. Thyroid Function Tests: No results for input(s): "TSH", "T4TOTAL", "FREET4", "T3FREE", "THYROIDAB" in the last 72 hours. Anemia Panel: No results for input(s): "VITAMINB12", "FOLATE", "FERRITIN", "TIBC", "IRON", "RETICCTPCT" in the last 72 hours. Sepsis Labs: No results for input(s): "PROCALCITON", "LATICACIDVEN" in the last 168 hours.  Recent Results (from the past 240 hour(s))  Urine Culture     Status: Abnormal   Collection Time: 09/27/21  1:00 PM   Specimen: Urine, Clean Catch  Result Value Ref Range Status   Specimen Description   Final    URINE, CLEAN CATCH Performed at [facility], [address] [place], [place] [address]    Special Requests   Final    NONE Performed at [facility], [address] [place], [place] [address]    Culture (A)  Final    <10,000 COLONIES/mL INSIGNIFICANT GROWTH Performed at [facility], [address], [place], [place] [address]    Report Status 09/28/2021 FINAL  Final     Radiology Studies: No results found.   Scheduled Meds:  citalopram  30 mg Oral Daily   enoxaparin (LOVENOX) injection  40 mg Subcutaneous Q24H   gabapentin  100 mg Oral TID   hydrALAZINE  50 mg Oral BID   lamoTRIgine  150 mg Oral TID   latanoprost  1 drop Both Eyes QHS  losartan  100 mg Oral Daily   metoprolol succinate  50 mg Oral Daily   sodium chloride flush  3 mL Intravenous Q12H   timolol  1 drop Both Eyes q morning   Continuous Infusions:   LOS: 3 days   Raiford Noble, DO [facility] Available via Epic secure chat 7am-7pm After these hours, please refer to coverage provider listed on [email or web address] 10/02/2021, 5:22 PM

## 2021-10-02 NOTE — Progress Notes (Addendum)
Physical Therapy Treatment Patient Details Name: Crystal Huber MRN: [id number] DOB: [birth year] Today's Date: 10/02/2021   History of Present Illness Pt is a 76 y/o F presenting to ED on 7/13 after fall, workup revealing R patellar fx, possible nondisplaced tibial plateau fracture on right, and multiple rib fxs (3-9 on right side). PMH includes arthritis, breast CA, GERD, HTN, HLD, RRMS, trigeminal neuralgia, and vertigo.    PT Comments    Session focused on gait and balance training. Pt with decreased safety awareness and requires frequent re-instruction. Pt will continue to benefit from acute care PT prior to discharge.  Recommendations for follow up therapy are one component of a multi-disciplinary discharge planning process, led by the attending physician.  Recommendations may be updated based on patient status, additional functional criteria and insurance authorization.  Follow Up Recommendations  Home health PT (pt wants to go home vs IPR)     Assistance Recommended at Discharge Intermittent Supervision/Assistance  Patient can return home with the following Assist for transportation;Assistance with cooking/housework;A little help with walking and/or transfers;A little help with bathing/dressing/bathroom   Equipment Recommendations  None recommended by PT    Recommendations for Other Services       Precautions / Restrictions Precautions Precautions: Fall Required Braces or Orthoses: Knee Immobilizer - Right Knee Immobilizer - Right: On at all times Restrictions Weight Bearing Restrictions: Yes RLE Weight Bearing: Weight bearing as tolerated     Mobility  Bed Mobility Overal bed mobility: Needs Assistance Bed Mobility: Supine to Sit     Supine to sit: Supervision          Transfers Overall transfer level: Needs assistance Equipment used: Rolling walker (2 wheels) Transfers: Sit to/from Stand Sit to Stand: Min guard           General transfer comment: Cues for  safety/technique    Ambulation/Gait Ambulation/Gait assistance: Min guard Gait Distance (Feet): 100 Feet Assistive device: Rolling walker (2 wheels) Gait Pattern/deviations: Step-to pattern, Step-through pattern, Antalgic, Staggering right       General Gait Details: Pt instructed in and performed modified three point pattern. Pt with inconsistent pattern (alternating between step to and step through). Pt with no LOB but required cues to avoid obstacles in hallway when veering slightly to R and was also provided with cues to increase her step width.   Stairs             Wheelchair Mobility    Modified Rankin (Stroke Patients Only)       Balance Overall balance assessment: Needs assistance Sitting-balance support: Feet supported, Bilateral upper extremity supported Sitting balance-Leahy Scale: Fair     Standing balance support: Bilateral upper extremity supported, During functional activity, Reliant on assistive device for balance, No upper extremity supported, Single extremity supported Standing balance-Leahy Scale: Fair Standing balance comment: reliant on external support                            Cognition Arousal/Alertness: Awake/alert Behavior During Therapy: WFL for tasks assessed/performed Overall Cognitive Status: Impaired/Different from baseline Area of Impairment: Attention, Memory, Following commands, Safety/judgement, Problem solving, Awareness                     Memory: Decreased recall of precautions, Decreased short-term memory Following Commands: Follows one step commands inconsistently, Follows one step commands with increased time Safety/Judgement: Decreased awareness of safety, Decreased awareness of deficits Awareness: Emergent, Intellectual Problem Solving: Requires tactile  cues, Requires verbal cues, Decreased initiation, Difficulty sequencing General Comments: Pt A and O but has difficulty adhering to safety precautions.  Pt reports some cognitive deficits at baseline due to MS but unable to determine her true baseline.        Exercises Other Exercises Other Exercises: Pt instructed in and performed following balance activities: each of following for 20 seconds, no UE use, and therapist CGA- FTEO, FTEO with head turns, FTEO with head up/down. Pt also performed FTEO with alternating arm abd x 10 ea (min assist due to retropulsion). Lastly, pt performed foot forwards and backwards x 10 ea without UE support and required mod assist to maintain balance.    General Comments        Pertinent Vitals/Pain Pain Assessment Pain Assessment: 0-10 Pain Score: 5  Pain Location: R knee Pain Intervention(s): Limited activity within patient's tolerance, Monitored during session    Home Living                          Prior Function            PT Goals (current goals can now be found in the care plan section) Acute Rehab PT Goals Patient Stated Goal: none stated PT Goal Formulation: With patient Time For Goal Achievement: 10/13/21 Potential to Achieve Goals: Good Progress towards PT goals: Progressing toward goals    Frequency    Min 5X/week      PT Plan Current plan remains appropriate    Co-evaluation              AM-PAC PT "6 Clicks" Mobility   Outcome Measure  Help needed turning from your back to your side while in a flat bed without using bedrails?: A Little Help needed moving from lying on your back to sitting on the side of a flat bed without using bedrails?: A Little Help needed moving to and from a bed to a chair (including a wheelchair)?: A Little Help needed standing up from a chair using your arms (e.g., wheelchair or bedside chair)?: A Little Help needed to walk in hospital room?: A Little Help needed climbing 3-5 steps with a railing? : A Little 6 Click Score: 18    End of Session Equipment Utilized During Treatment: Gait belt;Right knee immobilizer Activity  Tolerance: Patient tolerated treatment well Patient left: in chair;with call bell/phone within reach;with chair alarm set Nurse Communication: Mobility status;Weight bearing status;Precautions PT Visit Diagnosis: Other abnormalities of gait and mobility (R26.89);Repeated falls (R29.6);Muscle weakness (generalized) (M62.81)     Time: 5621-3086 PT Time Calculation (min) (ACUTE ONLY): 27 min  Charges:  $Gait Training: 8-22 mins $Neuromuscular Re-education: 8-22 mins                     Donna Bernard, PT    [facility] 10/02/2021, 10:44 AM

## 2021-10-02 NOTE — Plan of Care (Signed)

## 2021-10-02 NOTE — TOC Progression Note (Signed)
Transition of Care [facility]) - Progression Note    Patient Details  Name: TAMELA ELSAYED MRN: [id number] Date of Birth: Jul 25, 1945  [facility] of [facility]) CM/SW Contact  Sharin Mons, RN Phone Number: 10/02/2021, 9:11 PM  Clinical Narrative:    Late entry: 10/02/2021 1700  Pt confused. NCM spoke with pt 's friend Maudie Mercury regarding d/c plan for home with home health services. Per Maudie Mercury pt has roommate, roommate works and is unable to assist and care for pt. Maudie Mercury states pt will be home alone most the day with possibly a friend stopping by to check on her. Pt with unsafe d/c plan . Per IPR liaison pt doesn't meet criteria for IPR. SNF placement needs exploring.   TOC team to f/u in am ...   Expected Discharge Plan: [place] (vs SNF) Barriers to Discharge: Continued Medical Work up  Expected Discharge Plan and Services Expected Discharge Plan: [place] (vs SNF) In-house Referral: Clinical Social Work Discharge Planning Services: CM Consult Post Acute Care Choice: [place] arrangements for the past 2 months: Single Family Home Expected Discharge Date: 10/02/21               DME Arranged: N/A DME Agency: NA       HH Arranged: PT, OT [place] Agency: [place] (now known as [facility]) Date [place]: 10/01/21 Time [place]: 1301 Representative spoke with at [place]: [place] ([place]) Interventions    Readmission Risk Interventions     No data to display

## 2021-10-02 NOTE — Care Management Important Message (Signed)
Important Message  Patient Details  Name: Crystal Huber MRN: [id number] Date of Birth: [birth year]   Medicare Important Message Given:  Yes     Orbie Pyo 10/02/2021, 3:02 PM

## 2021-10-03 DIAGNOSIS — S2249XA Multiple fractures of ribs, unspecified side, initial encounter for closed fracture: Secondary | ICD-10-CM | POA: Diagnosis not present

## 2021-10-03 LAB — CBC WITH DIFFERENTIAL/PLATELET
Abs Immature Granulocytes: 0.03 10*3/uL (ref 0.00–0.07)
Basophils Absolute: 0.1 10*3/uL (ref 0.0–0.1)
Basophils Relative: 1 %
Eosinophils Absolute: 0.3 10*3/uL (ref 0.0–0.5)
Eosinophils Relative: 5 %
HCT: 37.2 % (ref 36.0–46.0)
Hemoglobin: 12.6 g/dL (ref 12.0–15.0)
Immature Granulocytes: 1 %
Lymphocytes Relative: 25 %
Lymphs Abs: 1.6 10*3/uL (ref 0.7–4.0)
MCH: 32.1 pg (ref 26.0–34.0)
MCHC: 33.9 g/dL (ref 30.0–36.0)
MCV: 94.9 fL (ref 80.0–100.0)
Monocytes Absolute: 0.9 10*3/uL (ref 0.1–1.0)
Monocytes Relative: 14 %
Neutro Abs: 3.4 10*3/uL (ref 1.7–7.7)
Neutrophils Relative %: 54 %
Platelets: 278 10*3/uL (ref 150–400)
RBC: 3.92 MIL/uL (ref 3.87–5.11)
RDW: 13.4 % (ref 11.5–15.5)
WBC: 6.3 10*3/uL (ref 4.0–10.5)
nRBC: 0 % (ref 0.0–0.2)

## 2021-10-03 LAB — COMPREHENSIVE METABOLIC PANEL
ALT: 29 U/L (ref 0–44)
AST: 34 U/L (ref 15–41)
Albumin: 3.4 g/dL — ABNORMAL LOW (ref 3.5–5.0)
Alkaline Phosphatase: 106 U/L (ref 38–126)
Anion gap: 7 (ref 5–15)
BUN: 15 mg/dL (ref 8–23)
CO2: 26 mmol/L (ref 22–32)
Calcium: 9.1 mg/dL (ref 8.9–10.3)
Chloride: 101 mmol/L (ref 98–111)
Creatinine, Ser: 0.68 mg/dL (ref 0.44–1.00)
GFR, Estimated: >60 mL/min (ref >60)
Glucose, Bld: 98 mg/dL (ref 70–99)
Potassium: 3.8 mmol/L (ref 3.5–5.1)
Sodium: 134 mmol/L — ABNORMAL LOW (ref 135–145)
Total Bilirubin: 0.6 mg/dL (ref 0.3–1.2)
Total Protein: 6.3 g/dL — ABNORMAL LOW (ref 6.5–8.1)

## 2021-10-03 LAB — IRON AND TIBC
Iron: 80 ug/dL (ref 28–170)
Saturation Ratios: 23 % (ref 10.4–31.8)
TIBC: 353 ug/dL (ref 250–450)
UIBC: 273 ug/dL

## 2021-10-03 LAB — GLUCOSE, CAPILLARY: Glucose-Capillary: 83 mg/dL (ref 70–99)

## 2021-10-03 LAB — PHOSPHORUS: Phosphorus: 3.1 mg/dL (ref 2.5–4.6)

## 2021-10-03 LAB — RETICULOCYTES
Immature Retic Fract: 10.4 % (ref 2.3–15.9)
RBC.: 3.89 MIL/uL (ref 3.87–5.11)
Retic Count, Absolute: 70.4 10*3/uL (ref 19.0–186.0)
Retic Ct Pct: 1.8 % (ref 0.4–3.1)

## 2021-10-03 LAB — MAGNESIUM: Magnesium: 1.9 mg/dL (ref 1.7–2.4)

## 2021-10-03 LAB — FOLATE: Folate: 13 ng/mL (ref >5.9)

## 2021-10-03 LAB — FERRITIN: Ferritin: 61 ng/mL (ref 11–307)

## 2021-10-03 LAB — VITAMIN B12: Vitamin B-12: 553 pg/mL (ref 180–914)

## 2021-10-03 MED ORDER — LOSARTAN POTASSIUM 50 MG PO TABS
50.0000 mg | ORAL_TABLET | Freq: Every day | ORAL | Status: DC
  Administered 2021-10-04 – 2021-10-05 (×2): 50 mg via ORAL
  Filled 2021-10-03 (×2): qty 1

## 2021-10-03 MED ORDER — LOSARTAN POTASSIUM 50 MG PO TABS: 50.0000 mg | ORAL_TABLET | Freq: Every day | ORAL | 1 refills | Status: DC

## 2021-10-03 NOTE — Progress Notes (Signed)
Mobility Specialist Criteria Algorithm Info.   10/03/21 1550  Mobility  Activity Ambulated with assistance in hallway (in recliner before and after ambulation)  Range of Motion/Exercises Active;All extremities  Level of Assistance Contact guard assist, steadying assist  Assistive Device Front wheel walker  RLE Weight Bearing WBAT  Distance Ambulated (ft) 120 ft  Activity Response Tolerated well   Patient received in recliner chair, eager to participate in mobility. Ambulated min guard with slow steady gait. Returned to room without complaint or incident. Was left in recliner with all needs met, call bell in reach.   10/03/2021 5:00 PM  [place] Calleigh Lafontant, [place], [place]  CYELY:[phone] Office: [phone]

## 2021-10-03 NOTE — Progress Notes (Signed)
PROGRESS NOTE   Crystal Huber  JKK:[id number] DOB: 1945-09-08 DOA: 09/27/2021 PCP: [name], NP  Brief Narrative:  76 year old female known HTN HLD tobacco Breast cancer Multiple sclerosis (relapsing, remitting) followed by [name] neurology-previously on Trigeminal neuralgia on Lamictal and gabapentin  Admitted 09/28/2021 with 6 falls in 1 week-found to have nondisplaced acute patella fracture multiple rib fractures and tibia fracture ruled out-seen by Dr. Percell Miller and ultimately was placed in a knee immobilizer Also felt to have possible urinary tract infection MRI brain did not show any new lesions of therapy   Hospital-Problem based course  Polytrauma including nondisplaced acute fracture secondary to multiple falls Etiology not entirely clear--orthostatics only mildly positive Follow-up with Dr. Percell Miller orthopedics in 1 to 2 weeks-?  Skilled placement Relapsing remitting multiple sclerosis Follows with Dr. Rachel Moulds and is off therapy Outpatient follow-up with him UTI on admission Received 1 dose of Rocephin complete 3 days of therapy and then discontinue Trigeminal neuralgia Continue Lamictal 150 3 times daily continue gabapentin specialized dosing Mild AKI Continue saline 75 cc/8 recheck labs a.m. HTN Continue hydralazine 50 twice daily metoprolol succinate 50 daily Cut back losartan to 50 daily Depression/anxiety  continue citalopram 30 daily  DVT prophylaxis: Lovenox Code Status: Full Family Communication: None Disposition:  Status is: Inpatient Remains inpatient appropriate because:   Awaiting placement   Consultants:  None  Procedures: No  Antimicrobials: No   Subjective:  Doing well coherent no distress no confusion Moving lower extremity on left side fairly well right side remains obstructed ROM intact otherwise Pleasant abdomen pain no fever  Objective: Vitals:   10/02/21 1045 10/02/21 1441 10/02/21 2022 10/03/21 0731  BP: 134/61 135/67  (!) 160/68 (!) 155/79  Pulse: (!) 58 (!) 53 61 (!) 54  Resp:  '17 15 17  '$ Temp:  97.8 F (36.6 C) 98.5 F (36.9 C) 97.9 F (36.6 C)  TempSrc:   Oral Oral  SpO2:  95% 97% 94%  Weight:      Height:        Intake/Output Summary (Last 24 hours) at 10/03/2021 1240 Last data filed at 10/03/2021 0900 Gross per 24 hour  Intake 1604.84 ml  Output 1 ml  Net 1603.84 ml   Filed Weights   09/27/21 1102 09/28/21 1801  Weight: 59.9 kg 59.9 kg    Examination:  Awake coherent no distress CTA B no added sound S1-S2 no murmur no rub or gallop intact Abdomen soft no rebound   Data Reviewed: personally reviewed   CBC    Component Value Date/Time   WBC 6.3 10/03/2021 0222   RBC 3.92 10/03/2021 0222   RBC 3.89 10/03/2021 0222   HGB 12.6 10/03/2021 0222   HGB 12.6 06/02/2019 1355   HCT 37.2 10/03/2021 0222   HCT 37.3 06/02/2019 1355   PLT 278 10/03/2021 0222   PLT 274 06/02/2019 1355   MCV 94.9 10/03/2021 0222   MCV 100 (H) 06/02/2019 1355   MCH 32.1 10/03/2021 0222   MCHC 33.9 10/03/2021 0222   RDW 13.4 10/03/2021 0222   RDW 12.1 06/02/2019 1355   LYMPHSABS 1.6 10/03/2021 0222   LYMPHSABS 1.1 06/02/2019 1355   MONOABS 0.9 10/03/2021 0222   EOSABS 0.3 10/03/2021 0222   EOSABS 0.1 06/02/2019 1355   BASOSABS 0.1 10/03/2021 0222   BASOSABS 0.1 06/02/2019 1355      Latest Ref Rng & Units 10/03/2021    2:22 AM 10/02/2021    2:53 AM 10/01/2021    2:00 AM  CMP  Glucose 70 - 99 mg/dL 98  98  81   BUN 8 - 23 mg/dL '15  14  18   '$ Creatinine 0.44 - 1.00 mg/dL 0.68  0.76  0.84   Sodium 135 - 145 mmol/L 134  132  134   Potassium 3.5 - 5.1 mmol/L 3.8  3.9  3.9   Chloride 98 - 111 mmol/L 101  99  96   CO2 22 - 32 mmol/L '26  28  24   '$ Calcium 8.9 - 10.3 mg/dL 9.1  9.2  9.3   Total Protein 6.5 - 8.1 g/dL 6.3  6.4  6.1   Total Bilirubin 0.3 - 1.2 mg/dL 0.6  0.6  0.8   Alkaline Phos 38 - 126 U/L 106  106  98   AST 15 - 41 U/L 34  28  19   ALT 0 - 44 U/L '29  21  14      '$ Radiology  Studies: No results found.   Scheduled Meds:  citalopram  30 mg Oral Daily   enoxaparin (LOVENOX) injection  40 mg Subcutaneous Q24H   gabapentin  100 mg Oral TID   hydrALAZINE  50 mg Oral BID   lamoTRIgine  150 mg Oral TID   latanoprost  1 drop Both Eyes QHS   losartan  100 mg Oral Daily   metoprolol succinate  50 mg Oral Daily   sodium chloride flush  3 mL Intravenous Q12H   timolol  1 drop Both Eyes q morning   Continuous Infusions:  sodium chloride 75 mL/hr at 10/02/21 1758     LOS: 4 days   Time spent: Leary, MD [facility] To contact the attending provider between 7A-7P or the covering provider during after hours 7P-7A, please log into the web site [email or web address] and access using universal [place] password for that web site. If you do not have the password, please call the hospital operator.  10/03/2021, 12:40 PM

## 2021-10-03 NOTE — Progress Notes (Signed)
Physical Therapy Treatment Patient Details Name: Crystal Huber MRN: [id number] DOB: [birth year] Today's Date: 10/03/2021   History of Present Illness Pt is a 76 y/o F presenting to ED on 7/13 after fall, workup revealing R patellar fx, possible nondisplaced tibial plateau fracture on right, and multiple rib fxs (3-9 on right side). PMH includes arthritis, breast CA, GERD, HTN, HLD, RRMS, trigeminal neuralgia, and vertigo.    PT Comments    Pt was seen for routine of gait and exercises, with caregiver available to ask questions and offer information.  Pt is considering her needs to maybe include inpt rehab, and will need to see what help can be obtained for her. Will not be able to do HHPT option unless her caregiver is available 24/7, which this one cannot do.  Follow along for goals of acute PT as outlined in POC.  Recommendations for follow up therapy are one component of a multi-disciplinary discharge planning process, led by the attending physician.  Recommendations may be updated based on patient status, additional functional criteria and insurance authorization.  Follow Up Recommendations  Home health PT     Assistance Recommended at Discharge Intermittent Supervision/Assistance  Patient can return home with the following Assist for transportation;Assistance with cooking/housework;A little help with walking and/or transfers;A little help with bathing/dressing/bathroom   Equipment Recommendations  None recommended by PT    Recommendations for Other Services Rehab consult     Precautions / Restrictions Precautions Precautions: Fall Precaution Comments: monitor safety with walker use Required Braces or Orthoses: Knee Immobilizer - Right Knee Immobilizer - Right: On at all times Restrictions Weight Bearing Restrictions: Yes RLE Weight Bearing: Weight bearing as tolerated     Mobility  Bed Mobility Overal bed mobility: Needs Assistance Bed Mobility: Supine to Sit     Supine to  sit: Min assist          Transfers Overall transfer level: Needs assistance Equipment used: Rolling walker (2 wheels), 1 person hand held assist Transfers: Sit to/from Stand Sit to Stand: Min assist           General transfer comment: requires cues for sequence and safety of hand placement    Ambulation/Gait Ambulation/Gait assistance: Min guard Gait Distance (Feet): 100 Feet Assistive device: Rolling walker (2 wheels) Gait Pattern/deviations: Step-to pattern, Step-through pattern, Antalgic, Staggering right Gait velocity: reduced Gait velocity interpretation: <1.31 ft/sec, indicative of household ambulator Pre-gait activities: standing balance skills General Gait Details: Turns with greater stepping than is needed and talked with her about usign walker in a tighter turn radius   Chief Strategy Officer    Modified Rankin (Stroke Patients Only)       Balance Overall balance assessment: Needs assistance, History of Falls Sitting-balance support: Feet supported, Bilateral upper extremity supported Sitting balance-Leahy Scale: Fair     Standing balance support: Bilateral upper extremity supported, During functional activity Standing balance-Leahy Scale: Poor                              Cognition Arousal/Alertness: Awake/alert Behavior During Therapy: WFL for tasks assessed/performed Overall Cognitive Status: Impaired/Different from baseline Area of Impairment: Problem solving, Awareness, Safety/judgement, Following commands, Memory, Attention                   Current Attention Level: Selective Memory: Decreased recall of precautions Following Commands: Follows one step commands with increased  time, Follows one step commands inconsistently Safety/Judgement: Decreased awareness of deficits, Decreased awareness of safety Awareness: Intellectual Problem Solving: Slow processing, Requires verbal cues, Requires tactile  cues General Comments: pt is consistently needing to be cued for steps on RW with tendency to list to R and narrowly avoid her obstacles in room and hallway        Exercises General Exercises - Lower Extremity Ankle Circles/Pumps: AAROM, AROM, 5 reps Quad Sets: AROM, Left, 10 reps Gluteal Sets: AROM, 10 reps Hip ABduction/ADduction: AROM, AAROM, 10 reps    General Comments General comments (skin integrity, edema, etc.): pt was seen for progression of standing to turns and walking with dense cues for sequences.  Her caregiver is available to see how to manage immobilizer and did talk with PT about having conversation with pt about ALF      Pertinent Vitals/Pain Pain Assessment Pain Assessment: Faces Faces Pain Scale: Hurts a little bit Pain Location: R knee Pain Descriptors / Indicators: Guarding Pain Intervention(s): Limited activity within patient's tolerance, Monitored during session, Premedicated before session, Repositioned    Home Living                          Prior Function            PT Goals (current goals can now be found in the care plan section) Progress towards PT goals: Progressing toward goals    Frequency    Min 5X/week      PT Plan Current plan remains appropriate    Co-evaluation              AM-PAC PT "6 Clicks" Mobility   Outcome Measure  Help needed turning from your back to your side while in a flat bed without using bedrails?: A Little Help needed moving from lying on your back to sitting on the side of a flat bed without using bedrails?: A Little Help needed moving to and from a bed to a chair (including a wheelchair)?: A Little Help needed standing up from a chair using your arms (e.g., wheelchair or bedside chair)?: A Little Help needed to walk in hospital room?: A Little Help needed climbing 3-5 steps with a railing? : A Little 6 Click Score: 18    End of Session Equipment Utilized During Treatment: Gait belt;Right  knee immobilizer Activity Tolerance: Patient tolerated treatment well Patient left: in bed;with call bell/phone within reach;with chair alarm set Nurse Communication: Mobility status;Weight bearing status PT Visit Diagnosis: Other abnormalities of gait and mobility (R26.89);Repeated falls (R29.6);Muscle weakness (generalized) (M62.81)     Time: 9628-3662 PT Time Calculation (min) (ACUTE ONLY): 48 min  Charges:  $Gait Training: 8-22 mins $Therapeutic Exercise: 8-22 mins $Therapeutic Activity: 8-22 mins   Ramond Dial 10/03/2021, 5:15 PM  Mee Hives, PT PhD Acute Rehab Dept. Number: [place] and [place]

## 2021-10-03 NOTE — Plan of Care (Signed)

## 2021-10-03 NOTE — NC FL2 (Signed)
[place] LEVEL OF CARE SCREENING TOOL     IDENTIFICATION  Patient Name: Crystal Huber Birthdate: Apr 25, 1945 Sex: female Admission Date (Current Location): 09/27/2021  [facility] and [place] Number:  Herbalist and Address:  [facility] [place] [address] [place], [place] [address]      Provider Number: [id number]  Attending Physician Name and Address:  Nita Sells, MD  Relative Name and Phone Number:  Mina Marble Niece [phone]    Current Level of Care: Hospital Recommended Level of Care: [place] Prior Approval Number:    Date Approved/Denied:   PASRR Number: [id number]  Discharge Plan: SNF    Current Diagnoses: Patient Active Problem List   Diagnosis Date Noted   Tibial fracture 09/28/2021   Glaucoma 09/28/2021   Refusal of blood product 09/28/2021   Acute cystitis without hematuria    Closed nondisplaced fracture of right patella    Multiple rib fractures 09/27/2021   Gastroesophageal reflux disease without esophagitis 09/26/2020   Primary hypertension 09/26/2020   Tremor 11/19/2017   Vertigo 05/19/2017   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   Gait disorder 09/11/2015   Urinary frequency 07/14/2014   Trigeminal neuralgia 05/16/2014   Ingrown nail 12/13/2013   Onychomycosis 12/13/2013   Pain in lower limb 12/13/2013   Multiple sclerosis ([place]) 09/12/2013   Fatigue 09/12/2013   LBP (low back pain) 09/12/2013   Cannot sleep 09/12/2013   Cephalalgia 09/12/2013   Anxiety disorder 09/12/2013   Arthralgia of shoulder 09/12/2013   Extremity pain 09/12/2013   Fothergill's neuralgia 09/12/2013   Jerking gait 09/12/2013   Avitaminosis D 09/12/2013   Hypercholesterolemia 09/12/2013   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    Orientation RESPIRATION BLADDER Height & Weight     Self, Place  Normal Continent Weight: 132 lb  (59.9 kg) Height:  '5\' 3"'$  (160 cm)  BEHAVIORAL SYMPTOMS/MOOD NEUROLOGICAL BOWEL NUTRITION STATUS      Continent Diet (see discharge summary)  AMBULATORY STATUS COMMUNICATION OF NEEDS Skin   Supervision Verbally Normal                       Personal Care Assistance Level of Assistance  Bathing, Feeding, Dressing Bathing Assistance: Maximum assistance Feeding assistance: Independent Dressing Assistance: Maximum assistance     Functional Limitations Info  Sight, Hearing, Speech Sight Info: Adequate Hearing Info: Adequate Speech Info: Adequate    SPECIAL CARE FACTORS FREQUENCY  PT (By licensed PT), OT (By licensed OT)     PT Frequency: 5x week OT Frequency: 5xweek            Contractures Contractures Info: Not present    Additional Factors Info  Code Status, Allergies Code Status Info: full Allergies Info: Carbamazepine, Imipramine, Metronidazole, Oxcarbazepine, Penicillins, Red Blood Cells, Statins, Sulfa Antibiotics           Current Medications (10/03/2021):  This is the current hospital active medication list Current Facility-Administered Medications  Medication Dose Route Frequency Provider Last Rate Last Admin   0.9 %  sodium chloride infusion   Intravenous Continuous Raiford Noble [place], DO 75 mL/hr at 10/02/21 1758 New Bag at 10/02/21 1758   acetaminophen (TYLENOL) tablet 650 mg  650 mg Oral Q6H PRN Sid Falcon, MD   650 mg at 10/02/21 2128   Or   acetaminophen (TYLENOL) suppository 650 mg  650 mg Rectal Q6H PRN Sid Falcon, MD  citalopram (CELEXA) tablet 30 mg  30 mg Oral Daily Gilles Chiquito B, MD   30 mg at 10/03/21 0851   diphenhydrAMINE-zinc acetate (BENADRYL) 2-0.1 % cream   Topical TID PRN Kathryne Eriksson, NP   1 Application at 07/37/10 2356   enoxaparin (LOVENOX) injection 40 mg  40 mg Subcutaneous Q24H Gilles Chiquito B, MD   40 mg at 10/02/21 1803   gabapentin (NEURONTIN) capsule 100 mg  100 mg Oral TID Lucrezia Starch, MD   100 mg  at 10/03/21 0850   gabapentin (NEURONTIN) capsule 300-600 mg  300-600 mg Oral 5 X Daily PRN Gilles Chiquito B, MD   300 mg at 09/29/21 0717   hydrALAZINE (APRESOLINE) tablet 50 mg  50 mg Oral BID Sid Falcon, MD   50 mg at 10/03/21 0847   lamoTRIgine (LAMICTAL) tablet 150 mg  150 mg Oral TID Marcelyn Bruins, MD   150 mg at 10/03/21 0847   latanoprost (XALATAN) 0.005 % ophthalmic solution 1 drop  1 drop Both Eyes QHS Gilles Chiquito B, MD   1 drop at 10/02/21 2138   lip balm (CARMEX) ointment 1 Application  1 Application Topical PRN Sid Falcon, MD       loratadine (CLARITIN) tablet 10 mg  10 mg Oral Daily PRN Sid Falcon, MD       losartan (COZAAR) tablet 100 mg  100 mg Oral Daily Gilles Chiquito B, MD   100 mg at 10/03/21 0848   meclizine (ANTIVERT) tablet 25 mg  25 mg Oral BID PRN Sid Falcon, MD       metoprolol succinate (TOPROL-XL) 24 hr tablet 50 mg  50 mg Oral Daily Gilles Chiquito B, MD   50 mg at 10/03/21 0847   ondansetron (ZOFRAN) tablet 4 mg  4 mg Oral Q6H PRN Sid Falcon, MD       Or   ondansetron [facility]) injection 4 mg  4 mg Intravenous Q6H PRN Sid Falcon, MD       Oral care mouth rinse  15 mL Mouth Rinse PRN Raiford Noble Latif, DO       oxyCODONE (Oxy IR/ROXICODONE) immediate release tablet 5 mg  5 mg Oral Q4H PRN Sid Falcon, MD       phenol (CHLORASEPTIC) mouth spray 1 spray  1 spray Mouth/Throat PRN Sid Falcon, MD       polyvinyl alcohol (LIQUIFILM TEARS) 1.4 % ophthalmic solution 1 drop  1 drop Both Eyes PRN Gilles Chiquito B, MD       sodium chloride (OCEAN) 0.65 % nasal spray 1 spray  1 spray Each Nare PRN Gilles Chiquito B, MD       sodium chloride flush (NS) 0.9 % injection 3 mL  3 mL Intravenous Q12H Gilles Chiquito B, MD   3 mL at 10/03/21 0857   timolol (TIMOPTIC) 0.5 % ophthalmic solution 1 drop  1 drop Both Eyes q morning Gilles Chiquito B, MD   1 drop at 10/03/21 0857   traZODone (DESYREL) tablet 25 mg  25 mg Oral QHS PRN Sid Falcon, MD    25 mg at 10/02/21 2127     Discharge Medications: Please see discharge summary for a list of discharge medications.  Relevant Imaging Results:  Relevant Lab Results:   Additional Information SSN: [id number]  Joanne Chars, LCSW

## 2021-10-04 DIAGNOSIS — S2249XA Multiple fractures of ribs, unspecified side, initial encounter for closed fracture: Secondary | ICD-10-CM | POA: Diagnosis not present

## 2021-10-04 LAB — BASIC METABOLIC PANEL
Anion gap: 6 (ref 5–15)
BUN: 11 mg/dL (ref 8–23)
CO2: 24 mmol/L (ref 22–32)
Calcium: 8.9 mg/dL (ref 8.9–10.3)
Chloride: 106 mmol/L (ref 98–111)
Creatinine, Ser: 0.72 mg/dL (ref 0.44–1.00)
GFR, Estimated: >60 mL/min (ref >60)
Glucose, Bld: 88 mg/dL (ref 70–99)
Potassium: 3.9 mmol/L (ref 3.5–5.1)
Sodium: 136 mmol/L (ref 135–145)

## 2021-10-04 LAB — GLUCOSE, CAPILLARY: Glucose-Capillary: 99 mg/dL (ref 70–99)

## 2021-10-04 MED ORDER — NAPROXEN 500 MG PO TABS: 500.0000 mg | ORAL_TABLET | Freq: Two times a day (BID) | ORAL | 2 refills | Status: DC

## 2021-10-04 NOTE — Progress Notes (Signed)
Physical Therapy Treatment Patient Details Name: Crystal Huber MRN: [id number] DOB: 22-Sep-1945 Today's Date: 10/04/2021   History of Present Illness Pt is a 76 y/o F presenting to ED on 7/13 after fall, workup revealing R patellar fx, possible nondisplaced tibial plateau fracture on right, and multiple rib fxs (3-9 on right side). PMH includes arthritis, breast CA, GERD, HTN, HLD, RRMS, trigeminal neuralgia, and vertigo.    PT Comments    Pt instructed in and performed gait training. Pt progressing well with her gait and transfer abilities. Pt does not have assistance at home and would benefit from SNF due to her ongoing fall risk and impaired cognition.    Recommendations for follow up therapy are one component of a multi-disciplinary discharge planning process, led by the attending physician.  Recommendations may be updated based on patient status, additional functional criteria and insurance authorization.  Follow Up Recommendations  Home health PT (SNF if unable to obtain assistance at home due to safety concerns)     Assistance Recommended at Discharge Intermittent Supervision/Assistance  Patient can return home with the following Assist for transportation;Assistance with cooking/housework;A little help with walking and/or transfers;A little help with bathing/dressing/bathroom;Direct supervision/assist for medications management;Direct supervision/assist for financial management   Equipment Recommendations  None recommended by PT    Recommendations for Other Services       Precautions / Restrictions Precautions Precautions: Fall Precaution Comments: monitor safety with walker use Required Braces or Orthoses: Knee Immobilizer - Right Knee Immobilizer - Right: On at all times Restrictions Weight Bearing Restrictions: Yes RLE Weight Bearing: Weight bearing as tolerated     Mobility  Bed Mobility               General bed mobility comments: Pt standing with nurse leaving  BR upon arrival.    Transfers Overall transfer level: Needs assistance Equipment used: Rolling walker (2 wheels), 1 person hand held assist Transfers: Sit to/from Stand Sit to Stand: Supervision           General transfer comment: Pt performed 6 consecutive sit <> stands with standby A for safety following gait training.    Ambulation/Gait Ambulation/Gait assistance: Min guard, Supervision Gait Distance (Feet): 200 Feet Assistive device: Rolling walker (2 wheels) Gait Pattern/deviations: Step-through pattern, Antalgic Gait velocity: reduced     General Gait Details: Pt able to increase gait distance with less assistance required. CGA utilized initially but then standby A. No LOB occurred.   Stairs             Wheelchair Mobility    Modified Rankin (Stroke Patients Only)       Balance Overall balance assessment: Needs assistance, History of Falls Sitting-balance support: Feet supported, Bilateral upper extremity supported Sitting balance-Leahy Scale: Fair     Standing balance support: Bilateral upper extremity supported, During functional activity Standing balance-Leahy Scale: Poor                              Cognition Arousal/Alertness: Awake/alert Behavior During Therapy: WFL for tasks assessed/performed Overall Cognitive Status: Impaired/Different from baseline Area of Impairment: Problem solving, Awareness, Safety/judgement, Following commands, Memory, Attention                   Current Attention Level: Selective Memory: Decreased recall of precautions Following Commands: Follows one step commands with increased time Safety/Judgement: Decreased awareness of deficits, Decreased awareness of safety Awareness: Intellectual Problem Solving: Slow processing, Requires verbal cues,  Requires tactile cues General Comments: Pt A and O x 4        Exercises      General Comments        Pertinent Vitals/Pain Pain Assessment Pain  Assessment: No/denies pain Faces Pain Scale: Hurts a little bit Pain Location: R knee Pain Descriptors / Indicators: Guarding    Home Living                          Prior Function            PT Goals (current goals can now be found in the care plan section) Acute Rehab PT Goals PT Goal Formulation: With patient Time For Goal Achievement: 10/13/21 Potential to Achieve Goals: Good Progress towards PT goals: Progressing toward goals    Frequency    Min 5X/week      PT Plan Current plan remains appropriate    Co-evaluation              AM-PAC PT "6 Clicks" Mobility   Outcome Measure  Help needed turning from your back to your side while in a flat bed without using bedrails?: A Little Help needed moving from lying on your back to sitting on the side of a flat bed without using bedrails?: A Little Help needed moving to and from a bed to a chair (including a wheelchair)?: A Little Help needed standing up from a chair using your arms (e.g., wheelchair or bedside chair)?: A Little Help needed to walk in hospital room?: A Little Help needed climbing 3-5 steps with a railing? : A Little 6 Click Score: 18    End of Session Equipment Utilized During Treatment: Gait belt;Right knee immobilizer Activity Tolerance: Patient tolerated treatment well Patient left: with chair alarm set;in chair;with call bell/phone within reach Nurse Communication: Mobility status;Weight bearing status PT Visit Diagnosis: Other abnormalities of gait and mobility (R26.89);Repeated falls (R29.6);Muscle weakness (generalized) (M62.81)     Time: 7371-0626 PT Time Calculation (min) (ACUTE ONLY): 10 min  Charges:  $Gait Training: 8-22 mins                     Donna Bernard, PT    [facility] 10/04/2021, 12:26 PM

## 2021-10-04 NOTE — TOC Progression Note (Signed)
Transition of Care [facility]) - Progression Note    Patient Details  Name: Crystal Huber MRN: [id number] Date of Birth: [birth year]  Transition of Care [facility]) CM/SW Contact  Joanne Chars, LCSW Phone Number: 10/04/2021, 2:58 PM  Clinical Narrative:  CSW spoke with pt and friend Crystal Huber, provided bed offers pt is requesting response from [employer].  Discussed that based on pt progress, possible that SNF will not be approved by insurance. CSW reached out to [employer]. Messaged MD about good chance of SNF denial from insurance.  CSW spoke with PT regarding SNF recommendation on PT note.  1110: CSW reached out to [employer], still no decision on bed offer.  1225: Message from PT: new note in, will put SNF as backup, but pt now walking 200 feet  1430: still no response from Crystal Huber, discussed with supervisor Crystal Huber, will consult physician advisor.    1500: discussed with pt that insurance will not approve SNF request.  Pt roommate works during the day, home when not working to assist.  Pt also has previously worked with [facility] aide agency for her husband and still has contact there, will reach out to see about getting [place] aide support for time when roommate is at work.     Expected Discharge Plan: [place] (vs SNF) Barriers to Discharge: Continued Medical Work up  Expected Discharge Plan and Services Expected Discharge Plan: [place] (vs SNF) In-house Referral: Clinical Social Work Discharge Planning Services: CM Consult Post Acute Care Choice: [place] arrangements for the past 2 months: Single Family Home Expected Discharge Date: 10/04/21               DME Arranged: N/A DME Agency: NA       HH Arranged: PT, OT [place] Agency: [place] (now known as [place]) Date [place]: 10/01/21 Time [place]: 1301 Representative spoke with at [place]: Progress ([place])  Interventions    Readmission Risk Interventions     No data to display

## 2021-10-04 NOTE — Plan of Care (Signed)

## 2021-10-04 NOTE — Progress Notes (Signed)
Occupational Therapy Treatment Patient Details Name: Crystal Huber MRN: [id number] DOB: 18-Oct-1945 Today's Date: 10/04/2021   History of present illness Pt is a 76 y/o F presenting to ED on 7/13 after fall, workup revealing R patellar fx, possible nondisplaced tibial plateau fracture on right, and multiple rib fxs (3-9 on right side). PMH includes arthritis, breast CA, GERD, HTN, HLD, RRMS, trigeminal neuralgia, and vertigo.   OT comments  Pt progressing gradually towards OT goals though minor confusion noted with pt giggling during entirety of session. Pt able to mobilize to/from bathroom using RW with min guard and frequent cueing for DME safety/use. Pt able to complete LB ADLs during session with Supervision-Min A. Pt also noted with memory deficits as pt did not recall AE education for LB ADLs. Noted pt may not have increased light assist at home for safe DC with [facility] services. Would recommend postacute rehab if adequate support unable to be provided to decrease fall risk at home.    Recommendations for follow up therapy are one component of a multi-disciplinary discharge planning process, led by the attending physician.  Recommendations may be updated based on patient status, additional functional criteria and insurance authorization.    Follow Up Recommendations  Home health OT (SNF rehab if increased support unable to be provided at DC)    Assistance Recommended at Discharge Intermittent Supervision/Assistance  Patient can return home with the following  Assistance with cooking/housework;Direct supervision/assist for medications management;Direct supervision/assist for financial management;Assist for transportation;Help with stairs or ramp for entrance;A little help with walking and/or transfers;A little help with bathing/dressing/bathroom   Equipment Recommendations  Other (comment) (AE for LB ADLs)    Recommendations for Other Services      Precautions / Restrictions  Precautions Precautions: Fall Required Braces or Orthoses: Knee Immobilizer - Right Knee Immobilizer - Right: On at all times Restrictions Weight Bearing Restrictions: Yes RLE Weight Bearing: Weight bearing as tolerated       Mobility Bed Mobility Overal bed mobility: Needs Assistance Bed Mobility: Supine to Sit     Supine to sit: Supervision     General bed mobility comments: increased time/effort, progressing towards EOB on entry d/t need to use bathroom    Transfers Overall transfer level: Needs assistance Equipment used: Rolling walker (2 wheels), 1 person hand held assist Transfers: Sit to/from Stand Sit to Stand: Min guard           General transfer comment: no assist to stand     Balance Overall balance assessment: Needs assistance, History of Falls Sitting-balance support: Feet supported, Bilateral upper extremity supported Sitting balance-Leahy Scale: Fair     Standing balance support: Bilateral upper extremity supported, During functional activity Standing balance-Leahy Scale: Poor                             ADL either performed or assessed with clinical judgement   ADL Overall ADL's : Needs assistance/impaired     Grooming: Supervision/safety;Standing;Wash/dry face;Wash/dry hands Grooming Details (indicate cue type and reason): did get distracted and wash hands x 2 but no LOB noted during tasks             Lower Body Dressing: Minimal assistance;With adaptive equipment;Cueing for compensatory techniques;Sitting/lateral leans Lower Body Dressing Details (indicate cue type and reason): assist for R sock mgmt, asked about AE in previous OT session though pt had forgotten AE use. discussed easier clothing to manage with KI as pt asking about  jeans Toilet Transfer: Min guard;Cueing for safety;Cueing for sequencing;Ambulation;Rolling walker (2 wheels) Toilet Transfer Details (indicate cue type and reason): BSC over toilet, cues for RW mgmt  as pt tends to leave behind. no overt LOB Toileting- Clothing Manipulation and Hygiene: Supervision/safety Toileting - Clothing Manipulation Details (indicate cue type and reason): able to stand and complete peri care without issue     Functional mobility during ADLs: Min guard;Rolling walker (2 wheels);Cueing for safety General ADL Comments: Pt moving fairly well though noted poor awareness of DME safety and memory deficits impacting carryover of interventions.    Extremity/Trunk Assessment Upper Extremity Assessment Upper Extremity Assessment: Overall WFL for tasks assessed   Lower Extremity Assessment Lower Extremity Assessment: Defer to PT evaluation        Vision   Vision Assessment?: No apparent visual deficits   Perception     Praxis      Cognition Arousal/Alertness: Awake/alert Behavior During Therapy: WFL for tasks assessed/performed, Restless Overall Cognitive Status: Impaired/Different from baseline Area of Impairment: Problem solving, Awareness, Safety/judgement, Following commands, Memory, Attention                   Current Attention Level: Selective Memory: Decreased short-term memory Following Commands: Follows one step commands with increased time Safety/Judgement: Decreased awareness of deficits, Decreased awareness of safety Awareness: Emergent Problem Solving: Requires verbal cues, Requires tactile cues General Comments: Pt very pleasant, restless though giggling throughout session. noted memory deficits during session requiring re-education at times, noted decreased safety with RW and often leaving this DME behind though pt reports this is how she had a fall at home        Exercises      Shoulder Instructions       General Comments      Pertinent Vitals/ Pain       Pain Assessment Pain Assessment: Faces Faces Pain Scale: Hurts little more Pain Location: L side of jaw Pain Descriptors / Indicators: Grimacing, Guarding Pain  Intervention(s): Monitored during session, Other (comment) (requesting heat pack, NT notified)  Home Living                                          Prior Functioning/Environment              Frequency  Min 2X/week        Progress Toward Goals  OT Goals(current goals can now be found in the care plan section)  Progress towards OT goals: Progressing toward goals  Acute Rehab OT Goals Patient Stated Goal: move around more OT Goal Formulation: With patient Time For Goal Achievement: 10/14/21 Potential to Achieve Goals: Good ADL Goals Pt Will Perform Upper Body Dressing: with supervision;sitting;standing Pt Will Perform Lower Body Dressing: with min assist;with adaptive equipment;sit to/from stand;sitting/lateral leans Pt Will Transfer to Toilet: with min assist;with +2 assist;bedside commode;stand pivot transfer Additional ADL Goal #1: pt will complete bed mobility mod I in prep for ADLs  Plan Frequency remains appropriate;Discharge plan needs to be updated    Co-evaluation                 AM-PAC OT "6 Clicks" Daily Activity     Outcome Measure   Help from another person eating meals?: None Help from another person taking care of personal grooming?: A Little Help from another person toileting, which includes using toliet, bedpan, or urinal?: A Little  Help from another person bathing (including washing, rinsing, drying)?: A Little Help from another person to put on and taking off regular upper body clothing?: A Little Help from another person to put on and taking off regular lower body clothing?: A Little 6 Click Score: 19    End of Session Equipment Utilized During Treatment: Rolling walker (2 wheels);Right knee immobilizer  OT Visit Diagnosis: Unsteadiness on feet (R26.81);Other abnormalities of gait and mobility (R26.89);Muscle weakness (generalized) (M62.81);History of falling (Z91.81);Other symptoms and signs involving cognitive function    Activity Tolerance Patient tolerated treatment well   Patient Left in chair;with call bell/phone within reach;with chair alarm set   Nurse Communication Mobility status;Other (comment) (discussed with NT; IV alarming)        Time: 0726-0752 OT Time Calculation (min): 26 min  Charges: OT General Charges $OT Visit: 1 Visit OT Treatments $Self Care/Home Management : 23-37 mins  Malachy Chamber, OTR/L Acute Rehab Services Office: [phone]   Layla Maw 10/04/2021, 9:15 AM

## 2021-10-04 NOTE — Discharge Summary (Signed)
Physician Discharge Summary  Crystal Huber JKK:[id number] DOB: September 05, 1945 DOA: 09/27/2021  PCP: [name], NP  Admit date: 09/27/2021 Discharge date: 10/04/2021  Time spent: 27 minutes  Recommendations for Outpatient Follow-up:  Recommend outpatient follow-up with Dr. Percell Miller in about 1 week from discharge Check CBC Chem-12 in about 5 to 7 days on discharge Monitor as an outpatient symptoms of the neurological disease process and needs follow-up with neurologist Dr. Rachel Moulds  Discharge Diagnoses:  MAIN problem for hospitalization   Nondisplaced acute fracture of hip status post repair Mild AKI on admission Mild hyperkalemia   Please see below for itemized issues addressed in [facility]- refer to other progress notes for clarity if needed  Discharge Condition: Improved  Diet recommendation: Heart healthy  Filed Weights   09/27/21 1102 09/28/21 1801  Weight: 59.9 kg 59.9 kg    History of present illness:  76 year old female known HTN HLD tobacco Breast cancer Multiple sclerosis (relapsing, remitting) followed by [name] neurology-previously on Trigeminal neuralgia on Lamictal and gabapentin   Admitted 09/28/2021 with 6 falls in 1 week-found to have nondisplaced acute patella fracture multiple rib fractures and tibia fracture ruled out-seen by Dr. Percell Miller and ultimately was placed in a knee immobilizer Also felt to have possible urinary tract infection MRI brain did not show any new lesions of therapy  Hospital Course:  Polytrauma including nondisplaced acute fracture secondary to multiple falls Etiology not entirely clear--orthostatics only mildly positive Follow-up with Dr. Percell Miller orthopedics in 1 to 2 weeks-doesn't wish Oxy for pain--prefer NSAID--started low dose Naproxen on d/c Awaiting bed at skilled nursing facility Relapsing remitting multiple sclerosis Follows with [name] and is off therapy currently- Outpatient follow-up with him UTI on admission Received 1  dose of Rocephin complete 3 days of therapy on 7/15 and no further recurrences Trigeminal neuralgia Continue Lamictal 150 3 times daily continue gabapentin specialized dosing Mild AKI Had transient AKI with azotemia and was placed on saline-this resolved and we discontinued fluids on discharge HTN Continue hydralazine 50 twice daily metoprolol succinate 50 daily Cut back losartan to 50 daily Depression/anxiety  continue citalopram 30 daily   Discharge Exam: Vitals:   10/04/21 0543 10/04/21 0805  BP: 131/70 (!) 163/70  Pulse: (!) 59 (!) 52  Resp: 20 17  Temp: 98.8 F (37.1 C) 98.4 F (36.9 C)  SpO2: 99% 95%    Subj on day of d/c   Awake coherent no distress sitting up in bed chills no rigors   General Exam on discharge  EOMI NCAT no focal deficit CTA B no added sound no rales no rhonchi no wheeze ROM intact moving 4 limbs equally Abdomen soft no rebound no guarding  Discharge Instructions   Discharge Instructions     Call MD for:  difficulty breathing, headache or visual disturbances   Complete by: As directed    Call MD for:  extreme fatigue   Complete by: As directed    Call MD for:  hives   Complete by: As directed    Call MD for:  persistant dizziness or light-headedness   Complete by: As directed    Call MD for:  persistant nausea and vomiting   Complete by: As directed    Call MD for:  redness, tenderness, or signs of infection (pain, swelling, redness, odor or green/yellow discharge around incision site)   Complete by: As directed    Call MD for:  severe uncontrolled pain   Complete by: As directed    Call  MD for:  temperature >100.4   Complete by: As directed    Diet - low sodium heart healthy   Complete by: As directed    Diet general   Complete by: As directed    Discharge instructions   Complete by: As directed    You were cared for by a hospitalist during your hospital stay. If you have any questions about your discharge medications or the care  you received while you were in the hospital after you are discharged, you can call the unit and ask to speak with the hospitalist on call if the hospitalist that took care of you is not available. Once you are discharged, your primary care physician will handle any further medical issues. Please note that NO REFILLS for any discharge medications will be authorized once you are discharged, as it is imperative that you return to your primary care physician (or establish a relationship with a primary care physician if you do not have one) for your aftercare needs so that they can reassess your need for medications and monitor your lab values.  Follow up with PCP, Neurology, and Orthopedic Surgery in the outpatient setting. Take all medications as prescribed. If symptoms change or worsen please return to the ED for evaluation   Increase activity slowly   Complete by: As directed    Increase activity slowly   Complete by: As directed       Allergies as of 10/04/2021       Reactions   Carbamazepine    Decreased sodium level   Imipramine    Night sweats, more intense dreams   Metronidazole Nausea Only   Oxcarbazepine    Decreased sodium level   Penicillins Swelling   Yeast infection   Red Blood Cells Other (See Comments)   Jehovah's witness, does not want blood products   Statins    Gets hyponatremia and severe muscle pain   Sulfa Antibiotics Swelling   yeast infection        Medication List     STOP taking these medications    HYDROcodone-acetaminophen 5-325 MG tablet Commonly known as: NORCO/VICODIN       TAKE these medications    acetaminophen 325 MG tablet Commonly known as: TYLENOL Take 2 tablets (650 mg total) by mouth every 6 (six) hours as needed for mild pain (or Fever >/= 101).   citalopram 20 MG tablet Commonly known as: CELEXA Take 1.5 tablets (30 mg total) by mouth daily.   diphenhydrAMINE-zinc acetate cream Commonly known as: BENADRYL Apply topically 3  (three) times daily as needed for itching.   gabapentin 300 MG capsule Commonly known as: NEURONTIN Take 1 or 2 pills p.o. 3 times a day (up to 5 a day) What changed:  how much to take how to take this when to take this additional instructions   HAIR/SKIN/NAILS PO Take 1 tablet by mouth daily.   hydrALAZINE 50 MG tablet Commonly known as: APRESOLINE TAKE ONE TABLET BY MOUTH TWICE DAILY   lamoTRIgine 150 MG tablet Commonly known as: LAMICTAL Take 1 tablet (150 mg total) by mouth 3 (three) times daily.   latanoprost 0.005 % ophthalmic solution Commonly known as: XALATAN Place 1 drop into both eyes at bedtime.   lip balm ointment Apply 1 Application topically as needed for lip care (cold sores).   losartan 50 MG tablet Commonly known as: COZAAR Take 1 tablet (50 mg total) by mouth daily. What changed:  medication strength how much to take  meclizine 25 MG tablet Commonly known as: ANTIVERT Take 25 mg by mouth daily.   metoprolol succinate 50 MG 24 hr tablet Commonly known as: TOPROL-XL TAKE ONE TABLET BY MOUTH ONE TIME DAILY   multivitamin tablet Take 1 tablet by mouth daily.   ondansetron 4 MG tablet Commonly known as: ZOFRAN Take 1 tablet (4 mg total) by mouth every 6 (six) hours as needed for nausea.   oxyCODONE 5 MG immediate release tablet Commonly known as: Oxy IR/ROXICODONE Take 1 tablet (5 mg total) by mouth every 6 (six) hours as needed for moderate pain.   sodium chloride 0.65 % Soln nasal spray Commonly known as: OCEAN Place 1 spray into both nostrils as needed for congestion (nose irritation).   timolol 0.5 % ophthalmic solution Commonly known as: TIMOPTIC Place 1 drop into both eyes every morning.       Allergies  Allergen Reactions   Carbamazepine     Decreased sodium level   Imipramine     Night sweats, more intense dreams   Metronidazole Nausea Only   Oxcarbazepine     Decreased sodium level   Penicillins Swelling    Yeast  infection   Red Blood Cells Other (See Comments)    Jehovah's witness, does not want blood products   Statins     Gets hyponatremia and severe muscle pain   Sulfa Antibiotics Swelling    yeast infection    Follow-up Information     Renette Butters, MD. Schedule an appointment as soon as possible for a visit in 1 week(s).   Specialty: Orthopedic Surgery Contact information: [address] [place] [address] [place] [address] [phone]                  The results of significant diagnostics from this hospitalization (including imaging, microbiology, ancillary and laboratory) are listed below for reference.    Significant Diagnostic Studies: MR KNEE RIGHT WO CONTRAST  Result Date: 09/30/2021 CLINICAL DATA:  Right knee pain. Patellar fracture. Assess medial tibial plateau EXAM: MRI OF THE RIGHT KNEE WITHOUT CONTRAST TECHNIQUE: Multiplanar, multisequence MR imaging of the knee was performed. No intravenous contrast was administered. COMPARISON:  X-ray and CT from 09/27/2021 FINDINGS: Technical Note: Despite efforts by the technologist and patient, motion artifact is present on today's exam and could not be eliminated. This reduces exam sensitivity and specificity. MENISCI Medial meniscus:  Intrasubstance degeneration without discrete tear. Lateral meniscus: Intrasubstance degeneration without discrete tear. LIGAMENTS Cruciates: Intact ACL and PCL. Collaterals: Intact MCL. Lateral collateral ligament complex intact. CARTILAGE Patellofemoral: Focal partial-thickness chondral fissure of the lateral patellar facet (series 10, image 15). No trochlear chondral defect. Medial:  Mild chondral thinning without focal defect. Lateral:  No chondral defect. MISCELLANEOUS Joint: Small joint effusion. No layering hematocrit level. Soft tissue edema within the superior aspect of Hoffa's fat. Popliteal Fossa:  No Baker's cyst. Intact popliteus tendon. Extensor Mechanism: Intact quadriceps and  patellar tendons. Thickening of the proximal patellar tendon. Bones: Acute nondisplaced transversely oriented fracture through the inferior pole of the patella without extension to the articular surface of the patella. Mild bone marrow edema within the anterior aspect of the lateral tibial plateau without associated fracture. There is no fracture or contusion involving the medial tibial plateau, as questioned on previous studies. No marrow replacing bone lesion. Other: No significant periarticular soft tissue findings. IMPRESSION: 1. There is no fracture or contusion involving the medial tibial plateau, as questioned on previous studies. 2. Acute nondisplaced transversely oriented  fracture through the inferior pole of the patella. 3. Mild bone contusion involving the anterior aspect of the lateral tibial plateau without associated fracture. 4. Intrasubstance degeneration of the menisci without discrete tear. 5. Mild medial and patellofemoral compartment chondral irregularities, as above. 6. Small joint effusion. 7. Intact cruciate and collateral ligaments. Electronically Signed   By: Davina Poke D.O.   On: 09/30/2021 12:27   CT Knee Right Wo Contrast  Result Date: 09/27/2021 CLINICAL DATA:  Knee trauma, tibial plateau fracture (Age >= 5y) EXAM: CT OF THE RIGHT KNEE WITHOUT CONTRAST TECHNIQUE: Multidetector CT imaging of the right knee was performed according to the standard protocol. Multiplanar CT image reconstructions were also generated. RADIATION DOSE REDUCTION: This exam was performed according to the departmental dose-optimization program which includes automated exposure control, adjustment of the mA and/or kV according to patient size and/or use of iterative reconstruction technique. COMPARISON:  X-ray 09/28/2018 FINDINGS: Bones/Joint/Cartilage Acute transversely oriented nondisplaced fracture through the inferior pole of patella (series 8, image 111). Fracture line closely approximates but does not  definitively extend to the articular surface of the patella. There is subtle buckling of the posteromedial cortex of the proximal tibial metaphysis (series 7, image 79) with subtly increased sclerosis suggesting a possible nondisplaced impaction fracture. No well-defined fracture line. No evidence of a intra-articular fracture involving the articular surface of the tibial plateau. The bones are diffusely demineralized. Alignment is maintained. Mild patellofemoral joint space narrowing. Trace knee joint effusion. No lipohemarthrosis. Ligaments Suboptimally assessed by CT. Muscles and Tendons No acute musculotendinous abnormality by CT. Soft tissues Mild prepatellar subcutaneous edema. No fluid collection or hematoma. IMPRESSION: 1. Acute transversely oriented nondisplaced fracture through the inferior pole of the patella. 2. Subtle buckling of the posteromedial cortex of the proximal tibial metaphysis with subtly increased sclerosis suggesting a possible nondisplaced impaction fracture. 3. Trace knee joint effusion.  No lipohemarthrosis. Electronically Signed   By: Davina Poke D.O.   On: 09/27/2021 15:41   CT CHEST ABDOMEN PELVIS W CONTRAST  Result Date: 09/27/2021 CLINICAL DATA:  Polytrauma, blunt EXAM: CT CHEST, ABDOMEN, AND PELVIS WITH CONTRAST TECHNIQUE: Multidetector CT imaging of the chest, abdomen and pelvis was performed following the standard protocol during bolus administration of intravenous contrast. RADIATION DOSE REDUCTION: This exam was performed according to the departmental dose-optimization program which includes automated exposure control, adjustment of the mA and/or kV according to patient size and/or use of iterative reconstruction technique. CONTRAST:  123m OMNIPAQUE IOHEXOL 300 MG/ML  SOLN COMPARISON:  Rib series 09/24/2021 FINDINGS: CT CHEST FINDINGS Cardiovascular: Heart size within normal limits. No pericardial effusion. Thoracic aorta is nonaneurysmal. Scattered atherosclerotic  calcifications of the aorta and coronary arteries. Central pulmonary vasculature is nondilated. Mediastinum/Nodes: No enlarged mediastinal, hilar, or axillary lymph nodes. Thyroid gland, trachea, and esophagus demonstrate no significant findings. No evidence of mediastinal hematoma. Small hiatal hernia. Lungs/Pleura: No evidence of pulmonary contusion or laceration. No pleural effusion or pneumothorax. Musculoskeletal: Multiple acute or subacute fractures of the right ribs including the right fourth, fifth, sixth, seventh, eighth, and ninth ribs. The right fourth rib is fractured in 2 locations. There are additional healed right-sided rib fractures. Old healed fracture deformity of the right scapula. Additional old healed fractures of the left sixth and seventh ribs. Old healed mid sternal body fracture. Thoracic vertebral body heights are maintained. Multiple surgical clips in the right axillary and right breast soft tissues. No chest wall fluid collection or hematoma. CT ABDOMEN PELVIS FINDINGS Hepatobiliary: No hepatic injury or  perihepatic hematoma. Gallbladder is unremarkable. Pancreas: Unremarkable. No pancreatic ductal dilatation or surrounding inflammatory changes. Spleen: No splenic injury or perisplenic hematoma. Adrenals/Urinary Tract: No adrenal hemorrhage or renal injury identified. No renal stone or hydronephrosis. Bladder is moderately distended but otherwise unremarkable. Stomach/Bowel: Small hiatal hernia. Stomach is otherwise within normal limits. Appendix appears normal. No evidence of bowel wall thickening, distention, or inflammatory changes. Vascular/Lymphatic: Scattered aortic atherosclerosis. No enlarged abdominal or pelvic lymph nodes. Reproductive: Status post hysterectomy. No adnexal masses. Other: No free fluid. No abdominopelvic fluid collection. No pneumoperitoneum. No abdominal wall hernia. Musculoskeletal: Old healed fracture deformity of the right pubic bone. No acute fracture.  Pelvic bony ring intact without fracture or diastasis. Lumbar vertebral body heights and alignment are maintained. IMPRESSION: 1. Multiple acute or subacute right-sided rib fractures involving the right fourth through ninth ribs. The right fourth rib is fractured in 2 locations. 2. Lungs are clear.  No pneumothorax. 3. Old healed fracture deformities of the right scapula, left sixth and seventh ribs, right pubic bone, and mid sternal body. 4. No acute abdominopelvic findings. 5. Aortic Atherosclerosis (ICD10-I70.0). Electronically Signed   By: Davina Poke D.O.   On: 09/27/2021 14:14   CT Cervical Spine Wo Contrast  Result Date: 09/27/2021 CLINICAL DATA:  Trauma, fall EXAM: CT CERVICAL SPINE WITHOUT CONTRAST TECHNIQUE: Multidetector CT imaging of the cervical spine was performed without intravenous contrast. Multiplanar CT image reconstructions were also generated. RADIATION DOSE REDUCTION: This exam was performed according to the departmental dose-optimization program which includes automated exposure control, adjustment of the mA and/or kV according to patient size and/or use of iterative reconstruction technique. COMPARISON:  07/29/2021 FINDINGS: Alignment: There is minimal anterolisthesis at C4-C5 and C7-T1 levels suggesting previous ligamentous injury. Alignment of the posterior margins of vertebral bodies is otherwise unremarkable. Skull base and vertebrae: No recent fracture is seen. There is a 9 mm smoothly marginated calcification posterior to the spinous processes of C4 and C5 vertebrae suggesting old ligamentous injury. There is possible old tiny avulsion fracture at the tip of spinous process of C7 vertebra with no significant interval change. Soft tissues and spinal canal: There is no central spinal stenosis. Disc levels: There is encroachment of neural foramina at C5-C6 and C6-C7 levels more so on the right side at C6-C7 level. Upper chest: Unremarkable. Other: None. IMPRESSION: No recent  fracture is seen in cervical spine. Cervical spondylosis with encroachment of neural foramina at C5-C6 and C6-C7 levels. No significant interval changes are noted. Electronically Signed   By: Elmer Picker M.D.   On: 09/27/2021 14:08   DG Knee Complete 4 Views Right  Result Date: 09/27/2021 CLINICAL DATA:  Trauma EXAM: RIGHT KNEE - COMPLETE 4+ VIEW COMPARISON:  Knee radiograph 09/24/2021 FINDINGS: There is a nondisplaced fracture of the inferior patella. Trace joint effusion. Additional possible nondisplaced fracture of the medial tibial plateau which is potentially extra-articular. IMPRESSION: Nondisplaced inferior patellar fracture. Possible nondisplaced extra-articular medial tibial plateau fracture. Consider CT. Electronically Signed   By: Maurine Simmering M.D.   On: 09/27/2021 13:39   DG Shoulder Right  Result Date: 09/27/2021 CLINICAL DATA:  Trauma, fall EXAM: RIGHT SHOULDER - 2+ VIEW COMPARISON:  None Available. FINDINGS: No displaced fracture or dislocation is seen. Degenerative changes are noted in the [facility] joint. Surgical clips are seen in right axilla. There is displaced fracture in the posterolateral aspect of right seventh rib. There may be other undisplaced fractures in the adjacent ribs. IMPRESSION: No fracture or dislocation is seen in  right shoulder. There is displaced fracture in the posterolateral aspect of right seventh rib, possibly recent. Electronically Signed   By: Elmer Picker M.D.   On: 09/27/2021 13:36   CT Head Wo Contrast  Result Date: 09/27/2021 CLINICAL DATA:  Multiple falls. EXAM: CT HEAD WITHOUT CONTRAST TECHNIQUE: Contiguous axial images were obtained from the base of the skull through the vertex without intravenous contrast. RADIATION DOSE REDUCTION: This exam was performed according to the departmental dose-optimization program which includes automated exposure control, adjustment of the mA and/or kV according to patient size and/or use of iterative  reconstruction technique. COMPARISON:  Head CT Jul 29, 2021 FINDINGS: Brain: Similar periventricular hypodensities with the largest hypodensity along the anterior right lateral ventricle consistent with known history of multiple. No new focal hypodensities identified. Evidence of acute large vascular territory infarction, hemorrhage, hydrocephalus, extra-axial collection or mass lesion/mass effect. Vascular: No hyperdense vessel. Atherosclerotic calcifications of the internal and vertebral arteries at the skull base. Skull: Small extra calvarial hematoma overlying the right vertex on coronal image 89/4. Hyperostosis interna. Negative for fracture or focal lesion. Sinuses/Orbits: Visualized portions of the paranasal sinuses are predominantly clear. Orbits are grossly unremarkable. Other: Mastoid air cells are predominantly clear. IMPRESSION: 1. No acute intracranial findings. 2. Similar white matter hypodensities compatible with known history of multiple sclerosis. Electronically Signed   By: Dahlia Bailiff M.D.   On: 09/27/2021 11:45   DG Knee Complete 4 Views Right  Result Date: 09/24/2021 CLINICAL DATA:  Fall with rib pain. EXAM: RIGHT KNEE - COMPLETE 4+ VIEW COMPARISON:  None Available. FINDINGS: No evidence of fracture, dislocation, or joint effusion. No evidence of arthropathy or other focal bone abnormality. Soft tissues are unremarkable. IMPRESSION: Negative. Electronically Signed   By: Ronney Asters M.D.   On: 09/24/2021 18:02   DG Ribs Unilateral W/Chest Right  Result Date: 09/24/2021 CLINICAL DATA:  Fall, right rib pain EXAM: RIGHT RIBS AND CHEST - 3+ VIEW COMPARISON:  07/29/2021 FINDINGS: Multiple acute mildly displaced right-sided rib fractures involving the lateral right third through ninth ribs. The fourth and fifth rib fractures were present on the previous exam. The right lung is clear. No pneumothorax. Mild cardiomegaly. Aortic atherosclerosis. Multiple surgical clips overlie the chest wall.  IMPRESSION: Multiple acute-appearing mildly displaced right-sided rib fractures involving the lateral right third through ninth ribs. No pneumothorax. Electronically Signed   By: Davina Poke D.O.   On: 09/24/2021 17:58    Microbiology: Recent Results (from the past 240 hour(s))  Urine Culture     Status: Abnormal   Collection Time: 09/27/21  1:00 PM   Specimen: Urine, Clean Catch  Result Value Ref Range Status   Specimen Description   Final    URINE, CLEAN CATCH Performed at [facility], [address] [place], [place] [id number]    Special Requests   Final    NONE Performed at [facility], [address] [place], [place] [address]    Culture (A)  Final    <10,000 COLONIES/mL INSIGNIFICANT GROWTH Performed at [facility], [place] [address], [place], [place] [address]    Report Status 09/28/2021 FINAL  Final     Labs: Basic Metabolic Panel: Recent Labs  Lab 09/30/21 0237 10/01/21 0200 10/02/21 0253 10/03/21 0222 10/04/21 0352  NA 136 134* 132* 134* 136  K 3.7 3.9 3.9 3.8 3.9  CL 100 96* 99 101 106  CO2 '24 24 28 26 24  '$ GLUCOSE 86 81  98 98 88  BUN '14 18 14 15 11  '$ CREATININE 0.74 0.84 0.76 0.68 0.72  CALCIUM 9.1 9.3 9.2 9.1 8.9  MG 2.1 1.9 2.1 1.9  --   PHOS 3.4 3.3 3.0 3.1  --    Liver Function Tests: Recent Labs  Lab 09/29/21 0216 09/30/21 0237 10/01/21 0200 10/02/21 0253 10/03/21 0222  AST '19 17 19 28 '$ 34  ALT '15 15 14 21 29  '$ ALKPHOS 89 90 98 106 106  BILITOT 0.6 0.6 0.8 0.6 0.6  PROT 5.9* 5.9* 6.1* 6.4* 6.3*  ALBUMIN 3.3* 3.3* 3.3* 3.6 3.4*   Recent Labs  Lab 09/27/21 1300  LIPASE 26   No results for input(s): "AMMONIA" in the last 168 hours. CBC: Recent Labs  Lab 09/27/21 1122 09/28/21 1621 09/29/21 0216 09/30/21 0237 10/01/21 0200 10/02/21 0253 10/03/21 0222  WBC 5.2   < > 6.0 6.4 7.2 6.6 6.3  NEUTROABS 3.1  --   --  3.3 4.1 3.3 3.4  HGB 12.3   < > 12.1 11.9* 11.8* 12.8 12.6  HCT 37.3   < > 35.9* 35.3*  35.3* 38.1 37.2  MCV 96.6   < > 95.2 93.9 94.6 94.8 94.9  PLT 248   < > 251 258 266 289 278   < > = values in this interval not displayed.   Cardiac Enzymes: No results for input(s): "CKTOTAL", "CKMB", "CKMBINDEX", "TROPONINI" in the last 168 hours. BNP: BNP (last 3 results) No results for input(s): "BNP" in the last 8760 hours.  ProBNP (last 3 results) No results for input(s): "PROBNP" in the last 8760 hours.  CBG: Recent Labs  Lab 10/01/21 1154 10/01/21 1630 10/02/21 0813 10/02/21 1151 10/03/21 0734  GLUCAP 152* 116* 111* 113* 83       Signed:  Nita Sells MD   [facility] 10/04/2021, 9:10 AM

## 2021-10-05 LAB — CREATININE, SERUM
Creatinine, Ser: 0.71 mg/dL (ref 0.44–1.00)
GFR, Estimated: >60 mL/min (ref >60)

## 2021-10-05 NOTE — TOC Progression Note (Addendum)
Transition of Care [facility]) - Progression Note    Patient Details  Name: Crystal Huber MRN: [id number] Date of Birth: 08/17/1945  Transition of Care [facility] CM/SW Contact  Joanne Chars, LCSW Phone Number: 10/05/2021, 11:18 AM  Clinical Narrative:    CSW received response from [facility].  They can make bed offer.  Discussed self pay costs: Dustin Flock would need $9000 up front for private room, [phone] for semi private.  Family would also be responsible for meds, MD appts, and ancillaries.  CSW went into pt room to share this with pt, pt's step son's wife Eustaquio Maize was already on the phone with case manager asking to speak with CSW.  CSW took the phone, Eustaquio Maize upset on multiple issues, CSW listened and then provided the above information to her regarding self pay costs for SNF.  Beth stating she was told pt would go to SNF on Wed, does not understand what changed, wanting to talk to MD, finally hung up.  CSW did share the above info with pt as well.     1130: SNF auth submitted to [facility], per MD request.    1430: Auth approved in [place]: [id number], 5 days: 7/21-7/25.  MD informed.  CSW informed pt of SNF approval, she is in agreement with [facility] Flock.  CSW spoke with [facility] and they can take today.  CSW spoke with Bjorn Pippin and informed her of this.  Expected Discharge Plan: [place] (vs SNF) Barriers to Discharge: Continued Medical Work up  Expected Discharge Plan and Services Expected Discharge Plan: [place] (vs SNF) In-house Referral: Clinical Social Work Discharge Planning Services: CM Consult Post Acute Care Choice: [place] arrangements for the past 2 months: Single Family Home Expected Discharge Date: 10/04/21               DME Arranged: N/A DME Agency: NA       HH Arranged: PT, OT [place] Agency: [place] (now known as [facility]) Date [place]: 10/01/21 Time [place]:  1301 Representative spoke with at [place]: [place] ([place]) Interventions    Readmission Risk Interventions     No data to display

## 2021-10-05 NOTE — Progress Notes (Signed)
Patient has stabilized for discharge-Long discussion with daughter-in-law on the phone we will attempt to place patient at rehab-if not able to place then will need to go home with home health and private pay services  No charge  Verneita Griffes, MD [facility] 2:34 PM

## 2021-10-05 NOTE — Progress Notes (Signed)
Mobility Specialist Criteria Algorithm Info.   10/05/21 1200  Mobility  Activity Ambulated with assistance in hallway;Ambulated with assistance to bathroom  Range of Motion/Exercises Active;All extremities  Level of Assistance Minimal assist, patient does 75% or more  Assistive Device Front wheel walker  RLE Weight Bearing WBAT  Distance Ambulated (ft) 120 ft  Activity Response Tolerated well   Patient pleasantly agreed to participate in mobility. Ambulated to bathroom and then in hallway min guard with slow steady gait. Returned to room without complaint or incident. Was left with all needs met, call bell in reach.   10/05/2021 4:01 PM  [place] Milfred Krammes, [place], [place]  BTDHR:[phone] Office: [phone]

## 2021-10-05 NOTE — Discharge Summary (Addendum)
Physician Discharge Summary  Crystal Huber BHA:[id number] DOB: [birth year] DOA: 09/27/2021  D/c updated--patient is stable for d/c to SNF when avail  PCP: [name], NP  Admit date: 09/27/2021 Discharge date: 10/05/2021  Time spent: 27 minutes  Recommendations for Outpatient Follow-up:  Recommend outpatient follow-up with Dr. Percell Miller in about 1 week from discharge Check CBC Chem-12 in about 5 to 7 days on discharge Monitor as an outpatient symptoms of the neurological disease process and needs follow-up with neurologist [name]  Discharge Diagnoses:  MAIN problem for hospitalization   Nondisplaced acute fracture of hip status post repair Mild AKI on admission Mild hyperkalemia   Please see below for itemized issues addressed in HOpsital- refer to other progress notes for clarity if needed  Discharge Condition: Improved  Diet recommendation: Heart healthy  Filed Weights   09/27/21 1102 09/28/21 1801  Weight: 59.9 kg 59.9 kg    History of present illness:  76 year old female known HTN HLD tobacco Breast cancer Multiple sclerosis (relapsing, remitting) followed by [name] neurology-previously on Trigeminal neuralgia on Lamictal and gabapentin   Admitted 09/28/2021 with 6 falls in 1 week-found to have nondisplaced acute patella fracture multiple rib fractures and tibia fracture ruled out-seen by Dr. Percell Miller and ultimately was placed in a knee immobilizer Also felt to have possible urinary tract infection MRI brain did not show any new lesions of therapy  Hospital Course:  Polytrauma including nondisplaced acute fracture secondary to multiple falls Etiology not entirely clear--orthostatics only mildly positive Follow-up with Dr. Percell Miller orthopedics in 1 to 2 weeks-doesn't wish Oxy for pain--prefer NSAID--started low dose Naproxen on d/c She will be d/c to SNF on d/c Relapsing remitting multiple sclerosis Follows with [name] and is off therapy currently- Outpatient  follow-up with him UTI on admission Received 1 dose of Rocephin complete 3 days of therapy on 7/15 and no further recurrences Trigeminal neuralgia Continue Lamictal 150 3 times daily continue gabapentin specialized dosing Mild AKI Had transient AKI with azotemia and was placed on saline-this resolved and we discontinued fluids on discharge HTN Continue hydralazine 50 twice daily metoprolol succinate 50 daily Cut back losartan to 50 daily Depression/anxiety  continue citalopram 30 daily   Discharge Exam: Vitals:   10/04/21 2048 10/05/21 0803  BP: (!) 162/81 (!) 169/79  Pulse: (!) 51 62  Resp: 17 17  Temp: 98.3 F (36.8 C) 99.1 F (37.3 C)  SpO2: 100% 95%    Subj on day of d/c   Awake coherent no distress sitting up in bed chills no rigors Looks fair   General Exam on discharge  EOMI NCAT no focal deficit CTA B no added sound no rales no rhonchi no wheeze ROM intact moving 4 limbs equally Abdomen soft no rebound no guarding  Discharge Instructions   Discharge Instructions     Call MD for:  difficulty breathing, headache or visual disturbances   Complete by: As directed    Call MD for:  extreme fatigue   Complete by: As directed    Call MD for:  hives   Complete by: As directed    Call MD for:  persistant dizziness or light-headedness   Complete by: As directed    Call MD for:  persistant nausea and vomiting   Complete by: As directed    Call MD for:  redness, tenderness, or signs of infection (pain, swelling, redness, odor or green/yellow discharge around incision site)   Complete by: As directed    Call MD  for:  severe uncontrolled pain   Complete by: As directed    Call MD for:  temperature >100.4   Complete by: As directed    Diet - low sodium heart healthy   Complete by: As directed    Diet general   Complete by: As directed    Discharge instructions   Complete by: As directed    You were cared for by a hospitalist during your hospital stay. If you  have any questions about your discharge medications or the care you received while you were in the hospital after you are discharged, you can call the unit and ask to speak with the hospitalist on call if the hospitalist that took care of you is not available. Once you are discharged, your primary care physician will handle any further medical issues. Please note that NO REFILLS for any discharge medications will be authorized once you are discharged, as it is imperative that you return to your primary care physician (or establish a relationship with a primary care physician if you do not have one) for your aftercare needs so that they can reassess your need for medications and monitor your lab values.  Follow up with PCP, Neurology, and Orthopedic Surgery in the outpatient setting. Take all medications as prescribed. If symptoms change or worsen please return to the ED for evaluation   Increase activity slowly   Complete by: As directed    Increase activity slowly   Complete by: As directed    Increase activity slowly   Complete by: As directed       Allergies as of 10/05/2021       Reactions   Carbamazepine    Decreased sodium level   Imipramine    Night sweats, more intense dreams   Metronidazole Nausea Only   Oxcarbazepine    Decreased sodium level   Penicillins Swelling   Yeast infection   Red Blood Cells Other (See Comments)   Jehovah's witness, does not want blood products   Statins    Gets hyponatremia and severe muscle pain   Sulfa Antibiotics Swelling   yeast infection        Medication List     STOP taking these medications    HYDROcodone-acetaminophen 5-325 MG tablet Commonly known as: NORCO/VICODIN       TAKE these medications    acetaminophen 325 MG tablet Commonly known as: TYLENOL Take 2 tablets (650 mg total) by mouth every 6 (six) hours as needed for mild pain (or Fever >/= 101).   citalopram 20 MG tablet Commonly known as: CELEXA Take 1.5 tablets  (30 mg total) by mouth daily.   diphenhydrAMINE-zinc acetate cream Commonly known as: BENADRYL Apply topically 3 (three) times daily as needed for itching.   gabapentin 300 MG capsule Commonly known as: NEURONTIN Take 1 or 2 pills p.o. 3 times a day (up to 5 a day) What changed:  how much to take how to take this when to take this additional instructions   HAIR/SKIN/NAILS PO Take 1 tablet by mouth daily.   hydrALAZINE 50 MG tablet Commonly known as: APRESOLINE TAKE ONE TABLET BY MOUTH TWICE DAILY   lamoTRIgine 150 MG tablet Commonly known as: LAMICTAL Take 1 tablet (150 mg total) by mouth 3 (three) times daily.   latanoprost 0.005 % ophthalmic solution Commonly known as: XALATAN Place 1 drop into both eyes at bedtime.   lip balm ointment Apply 1 Application topically as needed for lip care (cold sores).  losartan 50 MG tablet Commonly known as: COZAAR Take 1 tablet (50 mg total) by mouth daily. What changed:  medication strength how much to take   meclizine 25 MG tablet Commonly known as: ANTIVERT Take 25 mg by mouth daily.   metoprolol succinate 50 MG 24 hr tablet Commonly known as: TOPROL-XL TAKE ONE TABLET BY MOUTH ONE TIME DAILY   multivitamin tablet Take 1 tablet by mouth daily.   naproxen 500 MG tablet Commonly known as: Naprosyn Take 1 tablet (500 mg total) by mouth 2 (two) times daily with a meal.   ondansetron 4 MG tablet Commonly known as: ZOFRAN Take 1 tablet (4 mg total) by mouth every 6 (six) hours as needed for nausea.   sodium chloride 0.65 % Soln nasal spray Commonly known as: OCEAN Place 1 spray into both nostrils as needed for congestion (nose irritation).   timolol 0.5 % ophthalmic solution Commonly known as: TIMOPTIC Place 1 drop into both eyes every morning.               Durable Medical Equipment  (From admission, onward)           Start     Ordered   10/05/21 1046  For home use only DME lightweight manual  wheelchair with seat cushion  Once       Comments: Patient suffers from R patellar fx,  which impairs their ability to perform daily activities like dressing in the home.  A walker will not resolve  issue with performing activities of daily living. A wheelchair will allow patient to safely perform daily activities. Patient is not able to propel themselves in the home using a standard weight wheelchair due to general weakness. Patient can self propel in the lightweight wheelchair. Length of need 12 months . Accessories: elevating leg rests (ELRs), wheel locks, extensions and anti-tippers.   10/05/21 1048           Allergies  Allergen Reactions   Carbamazepine     Decreased sodium level   Imipramine     Night sweats, more intense dreams   Metronidazole Nausea Only   Oxcarbazepine     Decreased sodium level   Penicillins Swelling    Yeast infection   Red Blood Cells Other (See Comments)    Jehovah's witness, does not want blood products   Statins     Gets hyponatremia and severe muscle pain   Sulfa Antibiotics Swelling    yeast infection    Follow-up Information     Renette Butters, MD. Schedule an appointment as soon as possible for a visit in 1 week(s).   Specialty: Orthopedic Surgery Contact information: [address] [place] [address] [place] [address] [phone]                  The results of significant diagnostics from this hospitalization (including imaging, microbiology, ancillary and laboratory) are listed below for reference.    Significant Diagnostic Studies: MR KNEE RIGHT WO CONTRAST  Result Date: 09/30/2021 CLINICAL DATA:  Right knee pain. Patellar fracture. Assess medial tibial plateau EXAM: MRI OF THE RIGHT KNEE WITHOUT CONTRAST TECHNIQUE: Multiplanar, multisequence MR imaging of the knee was performed. No intravenous contrast was administered. COMPARISON:  X-ray and CT from 09/27/2021 FINDINGS: Technical Note: Despite efforts by the  technologist and patient, motion artifact is present on today's exam and could not be eliminated. This reduces exam sensitivity and specificity. MENISCI Medial meniscus:  Intrasubstance degeneration without discrete tear. Lateral meniscus: Intrasubstance  degeneration without discrete tear. LIGAMENTS Cruciates: Intact ACL and PCL. Collaterals: Intact MCL. Lateral collateral ligament complex intact. CARTILAGE Patellofemoral: Focal partial-thickness chondral fissure of the lateral patellar facet (series 10, image 15). No trochlear chondral defect. Medial:  Mild chondral thinning without focal defect. Lateral:  No chondral defect. MISCELLANEOUS Joint: Small joint effusion. No layering hematocrit level. Soft tissue edema within the superior aspect of Hoffa's fat. Popliteal Fossa:  No Baker's cyst. Intact popliteus tendon. Extensor Mechanism: Intact quadriceps and patellar tendons. Thickening of the proximal patellar tendon. Bones: Acute nondisplaced transversely oriented fracture through the inferior pole of the patella without extension to the articular surface of the patella. Mild bone marrow edema within the anterior aspect of the lateral tibial plateau without associated fracture. There is no fracture or contusion involving the medial tibial plateau, as questioned on previous studies. No marrow replacing bone lesion. Other: No significant periarticular soft tissue findings. IMPRESSION: 1. There is no fracture or contusion involving the medial tibial plateau, as questioned on previous studies. 2. Acute nondisplaced transversely oriented fracture through the inferior pole of the patella. 3. Mild bone contusion involving the anterior aspect of the lateral tibial plateau without associated fracture. 4. Intrasubstance degeneration of the menisci without discrete tear. 5. Mild medial and patellofemoral compartment chondral irregularities, as above. 6. Small joint effusion. 7. Intact cruciate and collateral ligaments.  Electronically Signed   By: Davina Poke D.O.   On: 09/30/2021 12:27   CT Knee Right Wo Contrast  Result Date: 09/27/2021 CLINICAL DATA:  Knee trauma, tibial plateau fracture (Age >= 5y) EXAM: CT OF THE RIGHT KNEE WITHOUT CONTRAST TECHNIQUE: Multidetector CT imaging of the right knee was performed according to the standard protocol. Multiplanar CT image reconstructions were also generated. RADIATION DOSE REDUCTION: This exam was performed according to the departmental dose-optimization program which includes automated exposure control, adjustment of the mA and/or kV according to patient size and/or use of iterative reconstruction technique. COMPARISON:  X-ray 09/28/2018 FINDINGS: Bones/Joint/Cartilage Acute transversely oriented nondisplaced fracture through the inferior pole of patella (series 8, image 111). Fracture line closely approximates but does not definitively extend to the articular surface of the patella. There is subtle buckling of the posteromedial cortex of the proximal tibial metaphysis (series 7, image 79) with subtly increased sclerosis suggesting a possible nondisplaced impaction fracture. No well-defined fracture line. No evidence of a intra-articular fracture involving the articular surface of the tibial plateau. The bones are diffusely demineralized. Alignment is maintained. Mild patellofemoral joint space narrowing. Trace knee joint effusion. No lipohemarthrosis. Ligaments Suboptimally assessed by CT. Muscles and Tendons No acute musculotendinous abnormality by CT. Soft tissues Mild prepatellar subcutaneous edema. No fluid collection or hematoma. IMPRESSION: 1. Acute transversely oriented nondisplaced fracture through the inferior pole of the patella. 2. Subtle buckling of the posteromedial cortex of the proximal tibial metaphysis with subtly increased sclerosis suggesting a possible nondisplaced impaction fracture. 3. Trace knee joint effusion.  No lipohemarthrosis. Electronically  Signed   By: Davina Poke D.O.   On: 09/27/2021 15:41   CT CHEST ABDOMEN PELVIS W CONTRAST  Result Date: 09/27/2021 CLINICAL DATA:  Polytrauma, blunt EXAM: CT CHEST, ABDOMEN, AND PELVIS WITH CONTRAST TECHNIQUE: Multidetector CT imaging of the chest, abdomen and pelvis was performed following the standard protocol during bolus administration of intravenous contrast. RADIATION DOSE REDUCTION: This exam was performed according to the departmental dose-optimization program which includes automated exposure control, adjustment of the mA and/or kV according to patient size and/or use of iterative reconstruction technique.  CONTRAST:  141m OMNIPAQUE IOHEXOL 300 MG/ML  SOLN COMPARISON:  Rib series 09/24/2021 FINDINGS: CT CHEST FINDINGS Cardiovascular: Heart size within normal limits. No pericardial effusion. Thoracic aorta is nonaneurysmal. Scattered atherosclerotic calcifications of the aorta and coronary arteries. Central pulmonary vasculature is nondilated. Mediastinum/Nodes: No enlarged mediastinal, hilar, or axillary lymph nodes. Thyroid gland, trachea, and esophagus demonstrate no significant findings. No evidence of mediastinal hematoma. Small hiatal hernia. Lungs/Pleura: No evidence of pulmonary contusion or laceration. No pleural effusion or pneumothorax. Musculoskeletal: Multiple acute or subacute fractures of the right ribs including the right fourth, fifth, sixth, seventh, eighth, and ninth ribs. The right fourth rib is fractured in 2 locations. There are additional healed right-sided rib fractures. Old healed fracture deformity of the right scapula. Additional old healed fractures of the left sixth and seventh ribs. Old healed mid sternal body fracture. Thoracic vertebral body heights are maintained. Multiple surgical clips in the right axillary and right breast soft tissues. No chest wall fluid collection or hematoma. CT ABDOMEN PELVIS FINDINGS Hepatobiliary: No hepatic injury or perihepatic hematoma.  Gallbladder is unremarkable. Pancreas: Unremarkable. No pancreatic ductal dilatation or surrounding inflammatory changes. Spleen: No splenic injury or perisplenic hematoma. Adrenals/Urinary Tract: No adrenal hemorrhage or renal injury identified. No renal stone or hydronephrosis. Bladder is moderately distended but otherwise unremarkable. Stomach/Bowel: Small hiatal hernia. Stomach is otherwise within normal limits. Appendix appears normal. No evidence of bowel wall thickening, distention, or inflammatory changes. Vascular/Lymphatic: Scattered aortic atherosclerosis. No enlarged abdominal or pelvic lymph nodes. Reproductive: Status post hysterectomy. No adnexal masses. Other: No free fluid. No abdominopelvic fluid collection. No pneumoperitoneum. No abdominal wall hernia. Musculoskeletal: Old healed fracture deformity of the right pubic bone. No acute fracture. Pelvic bony ring intact without fracture or diastasis. Lumbar vertebral body heights and alignment are maintained. IMPRESSION: 1. Multiple acute or subacute right-sided rib fractures involving the right fourth through ninth ribs. The right fourth rib is fractured in 2 locations. 2. Lungs are clear.  No pneumothorax. 3. Old healed fracture deformities of the right scapula, left sixth and seventh ribs, right pubic bone, and mid sternal body. 4. No acute abdominopelvic findings. 5. Aortic Atherosclerosis (ICD10-I70.0). Electronically Signed   By: NDavina PokeD.O.   On: 09/27/2021 14:14   CT Cervical Spine Wo Contrast  Result Date: 09/27/2021 CLINICAL DATA:  Trauma, fall EXAM: CT CERVICAL SPINE WITHOUT CONTRAST TECHNIQUE: Multidetector CT imaging of the cervical spine was performed without intravenous contrast. Multiplanar CT image reconstructions were also generated. RADIATION DOSE REDUCTION: This exam was performed according to the departmental dose-optimization program which includes automated exposure control, adjustment of the mA and/or kV according  to patient size and/or use of iterative reconstruction technique. COMPARISON:  07/29/2021 FINDINGS: Alignment: There is minimal anterolisthesis at C4-C5 and C7-T1 levels suggesting previous ligamentous injury. Alignment of the posterior margins of vertebral bodies is otherwise unremarkable. Skull base and vertebrae: No recent fracture is seen. There is a 9 mm smoothly marginated calcification posterior to the spinous processes of C4 and C5 vertebrae suggesting old ligamentous injury. There is possible old tiny avulsion fracture at the tip of spinous process of C7 vertebra with no significant interval change. Soft tissues and spinal canal: There is no central spinal stenosis. Disc levels: There is encroachment of neural foramina at C5-C6 and C6-C7 levels more so on the right side at C6-C7 level. Upper chest: Unremarkable. Other: None. IMPRESSION: No recent fracture is seen in cervical spine. Cervical spondylosis with encroachment of neural foramina at C5-C6 and C6-C7  levels. No significant interval changes are noted. Electronically Signed   By: Elmer Picker M.D.   On: 09/27/2021 14:08   DG Knee Complete 4 Views Right  Result Date: 09/27/2021 CLINICAL DATA:  Trauma EXAM: RIGHT KNEE - COMPLETE 4+ VIEW COMPARISON:  Knee radiograph 09/24/2021 FINDINGS: There is a nondisplaced fracture of the inferior patella. Trace joint effusion. Additional possible nondisplaced fracture of the medial tibial plateau which is potentially extra-articular. IMPRESSION: Nondisplaced inferior patellar fracture. Possible nondisplaced extra-articular medial tibial plateau fracture. Consider CT. Electronically Signed   By: Maurine Simmering M.D.   On: 09/27/2021 13:39   DG Shoulder Right  Result Date: 09/27/2021 CLINICAL DATA:  Trauma, fall EXAM: RIGHT SHOULDER - 2+ VIEW COMPARISON:  None Available. FINDINGS: No displaced fracture or dislocation is seen. Degenerative changes are noted in the [facility] joint. Surgical clips are seen in right  axilla. There is displaced fracture in the posterolateral aspect of right seventh rib. There may be other undisplaced fractures in the adjacent ribs. IMPRESSION: No fracture or dislocation is seen in right shoulder. There is displaced fracture in the posterolateral aspect of right seventh rib, possibly recent. Electronically Signed   By: Elmer Picker M.D.   On: 09/27/2021 13:36   CT Head Wo Contrast  Result Date: 09/27/2021 CLINICAL DATA:  Multiple falls. EXAM: CT HEAD WITHOUT CONTRAST TECHNIQUE: Contiguous axial images were obtained from the base of the skull through the vertex without intravenous contrast. RADIATION DOSE REDUCTION: This exam was performed according to the departmental dose-optimization program which includes automated exposure control, adjustment of the mA and/or kV according to patient size and/or use of iterative reconstruction technique. COMPARISON:  Head CT Jul 29, 2021 FINDINGS: Brain: Similar periventricular hypodensities with the largest hypodensity along the anterior right lateral ventricle consistent with known history of multiple. No new focal hypodensities identified. Evidence of acute large vascular territory infarction, hemorrhage, hydrocephalus, extra-axial collection or mass lesion/mass effect. Vascular: No hyperdense vessel. Atherosclerotic calcifications of the internal and vertebral arteries at the skull base. Skull: Small extra calvarial hematoma overlying the right vertex on coronal image 89/4. Hyperostosis interna. Negative for fracture or focal lesion. Sinuses/Orbits: Visualized portions of the paranasal sinuses are predominantly clear. Orbits are grossly unremarkable. Other: Mastoid air cells are predominantly clear. IMPRESSION: 1. No acute intracranial findings. 2. Similar white matter hypodensities compatible with known history of multiple sclerosis. Electronically Signed   By: Dahlia Bailiff M.D.   On: 09/27/2021 11:45   DG Knee Complete 4 Views  Right  Result Date: 09/24/2021 CLINICAL DATA:  Fall with rib pain. EXAM: RIGHT KNEE - COMPLETE 4+ VIEW COMPARISON:  None Available. FINDINGS: No evidence of fracture, dislocation, or joint effusion. No evidence of arthropathy or other focal bone abnormality. Soft tissues are unremarkable. IMPRESSION: Negative. Electronically Signed   By: Ronney Asters M.D.   On: 09/24/2021 18:02   DG Ribs Unilateral W/Chest Right  Result Date: 09/24/2021 CLINICAL DATA:  Fall, right rib pain EXAM: RIGHT RIBS AND CHEST - 3+ VIEW COMPARISON:  07/29/2021 FINDINGS: Multiple acute mildly displaced right-sided rib fractures involving the lateral right third through ninth ribs. The fourth and fifth rib fractures were present on the previous exam. The right lung is clear. No pneumothorax. Mild cardiomegaly. Aortic atherosclerosis. Multiple surgical clips overlie the chest wall. IMPRESSION: Multiple acute-appearing mildly displaced right-sided rib fractures involving the lateral right third through ninth ribs. No pneumothorax. Electronically Signed   By: Davina Poke D.O.   On: 09/24/2021 17:58  Microbiology: Recent Results (from the past 240 hour(s))  Urine Culture     Status: Abnormal   Collection Time: 09/27/21  1:00 PM   Specimen: Urine, Clean Catch  Result Value Ref Range Status   Specimen Description   Final    URINE, CLEAN CATCH Performed at [facility], [address] [place], [place] [address]    Special Requests   Final    NONE Performed at [facility], [address] [place], [place] [address]    Culture (A)  Final    <10,000 COLONIES/mL INSIGNIFICANT GROWTH Performed at [facility], [place] [address] [place], [place] [address]    Report Status 09/28/2021 FINAL  Final     Labs: Basic Metabolic Panel: Recent Labs  Lab 09/30/21 0237 10/01/21 0200 10/02/21 0253 10/03/21 0222 10/04/21 0352 10/05/21 0521  NA 136 134* 132* 134* 136  --   K 3.7 3.9 3.9 3.8 3.9   --   CL 100 96* 99 101 106  --   CO2 '24 24 28 26 24  '$ --   GLUCOSE 86 81 98 98 88  --   BUN '14 18 14 15 11  '$ --   CREATININE 0.74 0.84 0.76 0.68 0.72 0.71  CALCIUM 9.1 9.3 9.2 9.1 8.9  --   MG 2.1 1.9 2.1 1.9  --   --   PHOS 3.4 3.3 3.0 3.1  --   --     Liver Function Tests: Recent Labs  Lab 09/29/21 0216 09/30/21 0237 10/01/21 0200 10/02/21 0253 10/03/21 0222  AST '19 17 19 28 '$ 34  ALT '15 15 14 21 29  '$ ALKPHOS 89 90 98 106 106  BILITOT 0.6 0.6 0.8 0.6 0.6  PROT 5.9* 5.9* 6.1* 6.4* 6.3*  ALBUMIN 3.3* 3.3* 3.3* 3.6 3.4*    No results for input(s): "LIPASE", "AMYLASE" in the last 168 hours.  No results for input(s): "AMMONIA" in the last 168 hours. CBC: Recent Labs  Lab 09/29/21 0216 09/30/21 0237 10/01/21 0200 10/02/21 0253 10/03/21 0222  WBC 6.0 6.4 7.2 6.6 6.3  NEUTROABS  --  3.3 4.1 3.3 3.4  HGB 12.1 11.9* 11.8* 12.8 12.6  HCT 35.9* 35.3* 35.3* 38.1 37.2  MCV 95.2 93.9 94.6 94.8 94.9  PLT 251 258 [phone]    Cardiac Enzymes: No results for input(s): "CKTOTAL", "CKMB", "CKMBINDEX", "TROPONINI" in the last 168 hours. BNP: BNP (last 3 results) No results for input(s): "BNP" in the last 8760 hours.  ProBNP (last 3 results) No results for input(s): "PROBNP" in the last 8760 hours.  CBG: Recent Labs  Lab 10/01/21 1630 10/02/21 0813 10/02/21 1151 10/03/21 0734 10/04/21 2115  GLUCAP 116* 111* 113* 83 99        Signed:  Nita Sells MD   [facility] 10/05/2021, 11:11 AM

## 2021-10-05 NOTE — Plan of Care (Signed)

## 2021-10-06 DIAGNOSIS — R519 Headache, unspecified: Secondary | ICD-10-CM | POA: Diagnosis not present

## 2021-10-06 DIAGNOSIS — R269 Unspecified abnormalities of gait and mobility: Secondary | ICD-10-CM | POA: Diagnosis not present

## 2021-10-06 DIAGNOSIS — R296 Repeated falls: Secondary | ICD-10-CM | POA: Diagnosis not present

## 2021-10-06 DIAGNOSIS — S82001D Unspecified fracture of right patella, subsequent encounter for closed fracture with routine healing: Secondary | ICD-10-CM | POA: Diagnosis not present

## 2021-10-06 DIAGNOSIS — R261 Paralytic gait: Secondary | ICD-10-CM | POA: Diagnosis not present

## 2021-10-06 DIAGNOSIS — N390 Urinary tract infection, site not specified: Secondary | ICD-10-CM | POA: Diagnosis not present

## 2021-10-06 DIAGNOSIS — G35 Multiple sclerosis: Secondary | ICD-10-CM | POA: Diagnosis not present

## 2021-10-06 DIAGNOSIS — Z743 Need for continuous supervision: Secondary | ICD-10-CM | POA: Diagnosis not present

## 2021-10-06 DIAGNOSIS — Z4789 Encounter for other orthopedic aftercare: Secondary | ICD-10-CM | POA: Diagnosis not present

## 2021-10-06 DIAGNOSIS — R42 Dizziness and giddiness: Secondary | ICD-10-CM | POA: Diagnosis not present

## 2021-10-06 DIAGNOSIS — R531 Weakness: Secondary | ICD-10-CM | POA: Diagnosis not present

## 2021-10-06 DIAGNOSIS — G500 Trigeminal neuralgia: Secondary | ICD-10-CM | POA: Diagnosis not present

## 2021-10-06 DIAGNOSIS — I10 Essential (primary) hypertension: Secondary | ICD-10-CM | POA: Diagnosis not present

## 2021-10-06 DIAGNOSIS — S2239XA Fracture of one rib, unspecified side, initial encounter for closed fracture: Secondary | ICD-10-CM | POA: Diagnosis not present

## 2021-10-06 DIAGNOSIS — S2241XD Multiple fractures of ribs, right side, subsequent encounter for fracture with routine healing: Secondary | ICD-10-CM | POA: Diagnosis not present

## 2021-10-06 DIAGNOSIS — S82209A Unspecified fracture of shaft of unspecified tibia, initial encounter for closed fracture: Secondary | ICD-10-CM | POA: Diagnosis not present

## 2021-10-06 DIAGNOSIS — N179 Acute kidney failure, unspecified: Secondary | ICD-10-CM | POA: Diagnosis not present

## 2021-10-06 DIAGNOSIS — E875 Hyperkalemia: Secondary | ICD-10-CM | POA: Diagnosis not present

## 2021-10-06 DIAGNOSIS — N3000 Acute cystitis without hematuria: Secondary | ICD-10-CM | POA: Diagnosis not present

## 2021-10-06 DIAGNOSIS — S82009A Unspecified fracture of unspecified patella, initial encounter for closed fracture: Secondary | ICD-10-CM | POA: Diagnosis not present

## 2021-10-06 DIAGNOSIS — Z79899 Other long term (current) drug therapy: Secondary | ICD-10-CM | POA: Diagnosis not present

## 2021-10-08 ENCOUNTER — Telehealth: Payer: Self-pay

## 2021-10-08 DIAGNOSIS — N179 Acute kidney failure, unspecified: Secondary | ICD-10-CM | POA: Diagnosis not present

## 2021-10-08 DIAGNOSIS — G35 Multiple sclerosis: Secondary | ICD-10-CM | POA: Diagnosis not present

## 2021-10-08 DIAGNOSIS — I10 Essential (primary) hypertension: Secondary | ICD-10-CM | POA: Diagnosis not present

## 2021-10-08 DIAGNOSIS — R296 Repeated falls: Secondary | ICD-10-CM | POA: Diagnosis not present

## 2021-10-08 DIAGNOSIS — N390 Urinary tract infection, site not specified: Secondary | ICD-10-CM | POA: Diagnosis not present

## 2021-10-08 DIAGNOSIS — S2239XA Fracture of one rib, unspecified side, initial encounter for closed fracture: Secondary | ICD-10-CM | POA: Diagnosis not present

## 2021-10-08 NOTE — Telephone Encounter (Signed)
Transition Care Management Unsuccessful Follow-up Telephone Call  Date of discharge and from where:  10/06/2021; [facility]   Attempts:  1st Attempt  Reason for unsuccessful TCM follow-up call:  Unable to reach patient, patient will be following up with Dr. Percell Miller and Neurology, as well as PCP in roughly 1 weeks time

## 2021-10-09 DIAGNOSIS — N390 Urinary tract infection, site not specified: Secondary | ICD-10-CM | POA: Diagnosis not present

## 2021-10-09 DIAGNOSIS — G35 Multiple sclerosis: Secondary | ICD-10-CM | POA: Diagnosis not present

## 2021-10-09 DIAGNOSIS — S2239XA Fracture of one rib, unspecified side, initial encounter for closed fracture: Secondary | ICD-10-CM | POA: Diagnosis not present

## 2021-10-09 DIAGNOSIS — R296 Repeated falls: Secondary | ICD-10-CM | POA: Diagnosis not present

## 2021-10-09 DIAGNOSIS — G500 Trigeminal neuralgia: Secondary | ICD-10-CM | POA: Diagnosis not present

## 2021-10-10 DIAGNOSIS — R519 Headache, unspecified: Secondary | ICD-10-CM | POA: Diagnosis not present

## 2021-10-11 ENCOUNTER — Telehealth: Payer: Self-pay | Admitting: Neurology

## 2021-10-11 DIAGNOSIS — Z79899 Other long term (current) drug therapy: Secondary | ICD-10-CM | POA: Diagnosis not present

## 2021-10-11 NOTE — Telephone Encounter (Signed)
Tried calling pt back. Went straight to VM. LVM asking pt call office back.

## 2021-10-11 NOTE — Telephone Encounter (Signed)
Pt said, felt at home and currently in rehab at [facility] in [place].  Dosage for gabapentin (NEURONTIN) 300 MG capsule has one tablet 3 times day.  When ask nurse for another dose because in pain, nurse tell me can not have another tablet because is not on the prescription. Would like a call from the nurse.

## 2021-10-11 NOTE — Telephone Encounter (Signed)
Reviewed pt chart. Pt previously taking gabapentin '300mg'$ , Take 1 or 2 pills p.o. 3 times a day (up to 5 a day). She went to [facility] 09/27/21 for fall. Looks like they d/c'd her on this dose.

## 2021-10-12 DIAGNOSIS — S82001D Unspecified fracture of right patella, subsequent encounter for closed fracture with routine healing: Secondary | ICD-10-CM | POA: Diagnosis not present

## 2021-10-12 NOTE — Telephone Encounter (Signed)
Pt returned phone call, would like a call back.  

## 2021-10-15 DIAGNOSIS — S82009A Unspecified fracture of unspecified patella, initial encounter for closed fracture: Secondary | ICD-10-CM | POA: Diagnosis not present

## 2021-10-15 DIAGNOSIS — R296 Repeated falls: Secondary | ICD-10-CM | POA: Diagnosis not present

## 2021-10-15 DIAGNOSIS — G35 Multiple sclerosis: Secondary | ICD-10-CM | POA: Diagnosis not present

## 2021-10-15 DIAGNOSIS — G500 Trigeminal neuralgia: Secondary | ICD-10-CM | POA: Diagnosis not present

## 2021-10-15 NOTE — Telephone Encounter (Signed)
Pt called in and she states that in the hospital/rehab setting they have been staggering the gabapentin. I advised Dr Felecia Shelling had mentioned that since gabapentin can cause dizziness he would not want to increase the medication any more than 300 mg 4 times a day. I have passed this along. She verbalized understanding and she hopes that she will be dc home.  Pt mentioned her niece was concerned for memory/dementia. Advised in 08/14/21 visit it was briefly mentioned and that we would continue to monitor. Looks like she was suppose to have been scheduled for 6 month visit. Scheduled her for a visit 02/04/22 at 1:30 pm with a check in of 1 pm to allow time for [place] to do a memory exam with the next visit. She would like me to mail a copy of the last ov notes along with the appointment made.

## 2021-10-19 DIAGNOSIS — S82209A Unspecified fracture of shaft of unspecified tibia, initial encounter for closed fracture: Secondary | ICD-10-CM | POA: Diagnosis not present

## 2021-10-21 DIAGNOSIS — S2241XD Multiple fractures of ribs, right side, subsequent encounter for fracture with routine healing: Secondary | ICD-10-CM | POA: Diagnosis not present

## 2021-10-21 DIAGNOSIS — Z853 Personal history of malignant neoplasm of breast: Secondary | ICD-10-CM | POA: Diagnosis not present

## 2021-10-21 DIAGNOSIS — S82034D Nondisplaced transverse fracture of right patella, subsequent encounter for closed fracture with routine healing: Secondary | ICD-10-CM | POA: Diagnosis not present

## 2021-10-21 DIAGNOSIS — E785 Hyperlipidemia, unspecified: Secondary | ICD-10-CM | POA: Diagnosis not present

## 2021-10-21 DIAGNOSIS — F32A Depression, unspecified: Secondary | ICD-10-CM | POA: Diagnosis not present

## 2021-10-21 DIAGNOSIS — Z8781 Personal history of (healed) traumatic fracture: Secondary | ICD-10-CM | POA: Diagnosis not present

## 2021-10-21 DIAGNOSIS — Z9181 History of falling: Secondary | ICD-10-CM | POA: Diagnosis not present

## 2021-10-21 DIAGNOSIS — W19XXXD Unspecified fall, subsequent encounter: Secondary | ICD-10-CM | POA: Diagnosis not present

## 2021-10-21 DIAGNOSIS — G35 Multiple sclerosis: Secondary | ICD-10-CM | POA: Diagnosis not present

## 2021-10-21 DIAGNOSIS — I10 Essential (primary) hypertension: Secondary | ICD-10-CM | POA: Diagnosis not present

## 2021-10-21 DIAGNOSIS — G500 Trigeminal neuralgia: Secondary | ICD-10-CM | POA: Diagnosis not present

## 2021-10-21 DIAGNOSIS — M47812 Spondylosis without myelopathy or radiculopathy, cervical region: Secondary | ICD-10-CM | POA: Diagnosis not present

## 2021-10-21 DIAGNOSIS — R296 Repeated falls: Secondary | ICD-10-CM | POA: Diagnosis not present

## 2021-10-21 DIAGNOSIS — Z791 Long term (current) use of non-steroidal anti-inflammatories (NSAID): Secondary | ICD-10-CM | POA: Diagnosis not present

## 2021-10-23 DIAGNOSIS — G500 Trigeminal neuralgia: Secondary | ICD-10-CM | POA: Diagnosis not present

## 2021-10-23 DIAGNOSIS — R296 Repeated falls: Secondary | ICD-10-CM | POA: Diagnosis not present

## 2021-10-23 DIAGNOSIS — G35 Multiple sclerosis: Secondary | ICD-10-CM | POA: Diagnosis not present

## 2021-10-23 DIAGNOSIS — Z8781 Personal history of (healed) traumatic fracture: Secondary | ICD-10-CM | POA: Diagnosis not present

## 2021-10-23 DIAGNOSIS — Z853 Personal history of malignant neoplasm of breast: Secondary | ICD-10-CM | POA: Diagnosis not present

## 2021-10-23 DIAGNOSIS — S82034D Nondisplaced transverse fracture of right patella, subsequent encounter for closed fracture with routine healing: Secondary | ICD-10-CM | POA: Diagnosis not present

## 2021-10-23 DIAGNOSIS — S2241XD Multiple fractures of ribs, right side, subsequent encounter for fracture with routine healing: Secondary | ICD-10-CM | POA: Diagnosis not present

## 2021-10-23 DIAGNOSIS — W19XXXD Unspecified fall, subsequent encounter: Secondary | ICD-10-CM | POA: Diagnosis not present

## 2021-10-23 DIAGNOSIS — I10 Essential (primary) hypertension: Secondary | ICD-10-CM | POA: Diagnosis not present

## 2021-10-23 DIAGNOSIS — Z9181 History of falling: Secondary | ICD-10-CM | POA: Diagnosis not present

## 2021-10-23 DIAGNOSIS — E785 Hyperlipidemia, unspecified: Secondary | ICD-10-CM | POA: Diagnosis not present

## 2021-10-23 DIAGNOSIS — F32A Depression, unspecified: Secondary | ICD-10-CM | POA: Diagnosis not present

## 2021-10-23 DIAGNOSIS — M47812 Spondylosis without myelopathy or radiculopathy, cervical region: Secondary | ICD-10-CM | POA: Diagnosis not present

## 2021-10-23 DIAGNOSIS — Z791 Long term (current) use of non-steroidal anti-inflammatories (NSAID): Secondary | ICD-10-CM | POA: Diagnosis not present

## 2021-10-26 DIAGNOSIS — S82001D Unspecified fracture of right patella, subsequent encounter for closed fracture with routine healing: Secondary | ICD-10-CM | POA: Diagnosis not present

## 2021-10-30 DIAGNOSIS — G35 Multiple sclerosis: Secondary | ICD-10-CM | POA: Diagnosis not present

## 2021-10-30 DIAGNOSIS — Z791 Long term (current) use of non-steroidal anti-inflammatories (NSAID): Secondary | ICD-10-CM | POA: Diagnosis not present

## 2021-10-30 DIAGNOSIS — Z9181 History of falling: Secondary | ICD-10-CM | POA: Diagnosis not present

## 2021-10-30 DIAGNOSIS — S2241XD Multiple fractures of ribs, right side, subsequent encounter for fracture with routine healing: Secondary | ICD-10-CM | POA: Diagnosis not present

## 2021-10-30 DIAGNOSIS — M47812 Spondylosis without myelopathy or radiculopathy, cervical region: Secondary | ICD-10-CM | POA: Diagnosis not present

## 2021-10-30 DIAGNOSIS — R296 Repeated falls: Secondary | ICD-10-CM | POA: Diagnosis not present

## 2021-10-30 DIAGNOSIS — S82034D Nondisplaced transverse fracture of right patella, subsequent encounter for closed fracture with routine healing: Secondary | ICD-10-CM | POA: Diagnosis not present

## 2021-10-30 DIAGNOSIS — W19XXXD Unspecified fall, subsequent encounter: Secondary | ICD-10-CM | POA: Diagnosis not present

## 2021-10-30 DIAGNOSIS — Z8781 Personal history of (healed) traumatic fracture: Secondary | ICD-10-CM | POA: Diagnosis not present

## 2021-10-30 DIAGNOSIS — F32A Depression, unspecified: Secondary | ICD-10-CM | POA: Diagnosis not present

## 2021-10-30 DIAGNOSIS — E785 Hyperlipidemia, unspecified: Secondary | ICD-10-CM | POA: Diagnosis not present

## 2021-10-30 DIAGNOSIS — G500 Trigeminal neuralgia: Secondary | ICD-10-CM | POA: Diagnosis not present

## 2021-10-30 DIAGNOSIS — I10 Essential (primary) hypertension: Secondary | ICD-10-CM | POA: Diagnosis not present

## 2021-10-30 DIAGNOSIS — Z853 Personal history of malignant neoplasm of breast: Secondary | ICD-10-CM | POA: Diagnosis not present

## 2021-11-02 DIAGNOSIS — F32A Depression, unspecified: Secondary | ICD-10-CM | POA: Diagnosis not present

## 2021-11-02 DIAGNOSIS — G35 Multiple sclerosis: Secondary | ICD-10-CM | POA: Diagnosis not present

## 2021-11-02 DIAGNOSIS — G500 Trigeminal neuralgia: Secondary | ICD-10-CM | POA: Diagnosis not present

## 2021-11-02 DIAGNOSIS — S2241XD Multiple fractures of ribs, right side, subsequent encounter for fracture with routine healing: Secondary | ICD-10-CM | POA: Diagnosis not present

## 2021-11-02 DIAGNOSIS — Z853 Personal history of malignant neoplasm of breast: Secondary | ICD-10-CM | POA: Diagnosis not present

## 2021-11-02 DIAGNOSIS — E785 Hyperlipidemia, unspecified: Secondary | ICD-10-CM | POA: Diagnosis not present

## 2021-11-02 DIAGNOSIS — R296 Repeated falls: Secondary | ICD-10-CM | POA: Diagnosis not present

## 2021-11-02 DIAGNOSIS — I10 Essential (primary) hypertension: Secondary | ICD-10-CM | POA: Diagnosis not present

## 2021-11-02 DIAGNOSIS — Z8781 Personal history of (healed) traumatic fracture: Secondary | ICD-10-CM | POA: Diagnosis not present

## 2021-11-02 DIAGNOSIS — W19XXXD Unspecified fall, subsequent encounter: Secondary | ICD-10-CM | POA: Diagnosis not present

## 2021-11-02 DIAGNOSIS — Z791 Long term (current) use of non-steroidal anti-inflammatories (NSAID): Secondary | ICD-10-CM | POA: Diagnosis not present

## 2021-11-02 DIAGNOSIS — M47812 Spondylosis without myelopathy or radiculopathy, cervical region: Secondary | ICD-10-CM | POA: Diagnosis not present

## 2021-11-02 DIAGNOSIS — S82034D Nondisplaced transverse fracture of right patella, subsequent encounter for closed fracture with routine healing: Secondary | ICD-10-CM | POA: Diagnosis not present

## 2021-11-02 DIAGNOSIS — Z9181 History of falling: Secondary | ICD-10-CM | POA: Diagnosis not present

## 2021-11-03 DIAGNOSIS — Z8781 Personal history of (healed) traumatic fracture: Secondary | ICD-10-CM | POA: Diagnosis not present

## 2021-11-03 DIAGNOSIS — F32A Depression, unspecified: Secondary | ICD-10-CM | POA: Diagnosis not present

## 2021-11-03 DIAGNOSIS — Z853 Personal history of malignant neoplasm of breast: Secondary | ICD-10-CM | POA: Diagnosis not present

## 2021-11-03 DIAGNOSIS — S82034D Nondisplaced transverse fracture of right patella, subsequent encounter for closed fracture with routine healing: Secondary | ICD-10-CM | POA: Diagnosis not present

## 2021-11-03 DIAGNOSIS — G500 Trigeminal neuralgia: Secondary | ICD-10-CM | POA: Diagnosis not present

## 2021-11-03 DIAGNOSIS — Z9181 History of falling: Secondary | ICD-10-CM | POA: Diagnosis not present

## 2021-11-03 DIAGNOSIS — I10 Essential (primary) hypertension: Secondary | ICD-10-CM | POA: Diagnosis not present

## 2021-11-03 DIAGNOSIS — W19XXXD Unspecified fall, subsequent encounter: Secondary | ICD-10-CM | POA: Diagnosis not present

## 2021-11-03 DIAGNOSIS — E785 Hyperlipidemia, unspecified: Secondary | ICD-10-CM | POA: Diagnosis not present

## 2021-11-03 DIAGNOSIS — M47812 Spondylosis without myelopathy or radiculopathy, cervical region: Secondary | ICD-10-CM | POA: Diagnosis not present

## 2021-11-03 DIAGNOSIS — R296 Repeated falls: Secondary | ICD-10-CM | POA: Diagnosis not present

## 2021-11-03 DIAGNOSIS — Z791 Long term (current) use of non-steroidal anti-inflammatories (NSAID): Secondary | ICD-10-CM | POA: Diagnosis not present

## 2021-11-03 DIAGNOSIS — S2241XD Multiple fractures of ribs, right side, subsequent encounter for fracture with routine healing: Secondary | ICD-10-CM | POA: Diagnosis not present

## 2021-11-03 DIAGNOSIS — G35 Multiple sclerosis: Secondary | ICD-10-CM | POA: Diagnosis not present

## 2021-11-05 DIAGNOSIS — Z853 Personal history of malignant neoplasm of breast: Secondary | ICD-10-CM | POA: Diagnosis not present

## 2021-11-05 DIAGNOSIS — S82034D Nondisplaced transverse fracture of right patella, subsequent encounter for closed fracture with routine healing: Secondary | ICD-10-CM | POA: Diagnosis not present

## 2021-11-05 DIAGNOSIS — I10 Essential (primary) hypertension: Secondary | ICD-10-CM | POA: Diagnosis not present

## 2021-11-05 DIAGNOSIS — S2241XD Multiple fractures of ribs, right side, subsequent encounter for fracture with routine healing: Secondary | ICD-10-CM | POA: Diagnosis not present

## 2021-11-05 DIAGNOSIS — W19XXXD Unspecified fall, subsequent encounter: Secondary | ICD-10-CM | POA: Diagnosis not present

## 2021-11-05 DIAGNOSIS — R296 Repeated falls: Secondary | ICD-10-CM | POA: Diagnosis not present

## 2021-11-05 DIAGNOSIS — Z8781 Personal history of (healed) traumatic fracture: Secondary | ICD-10-CM | POA: Diagnosis not present

## 2021-11-05 DIAGNOSIS — E785 Hyperlipidemia, unspecified: Secondary | ICD-10-CM | POA: Diagnosis not present

## 2021-11-05 DIAGNOSIS — M47812 Spondylosis without myelopathy or radiculopathy, cervical region: Secondary | ICD-10-CM | POA: Diagnosis not present

## 2021-11-05 DIAGNOSIS — G35 Multiple sclerosis: Secondary | ICD-10-CM | POA: Diagnosis not present

## 2021-11-05 DIAGNOSIS — Z9181 History of falling: Secondary | ICD-10-CM | POA: Diagnosis not present

## 2021-11-05 DIAGNOSIS — G500 Trigeminal neuralgia: Secondary | ICD-10-CM | POA: Diagnosis not present

## 2021-11-05 DIAGNOSIS — Z791 Long term (current) use of non-steroidal anti-inflammatories (NSAID): Secondary | ICD-10-CM | POA: Diagnosis not present

## 2021-11-05 DIAGNOSIS — F32A Depression, unspecified: Secondary | ICD-10-CM | POA: Diagnosis not present

## 2021-11-07 ENCOUNTER — Encounter: Payer: Medicare Other | Admitting: Gastroenterology

## 2021-11-07 DIAGNOSIS — R519 Headache, unspecified: Secondary | ICD-10-CM | POA: Diagnosis not present

## 2021-11-08 DIAGNOSIS — Z853 Personal history of malignant neoplasm of breast: Secondary | ICD-10-CM | POA: Diagnosis not present

## 2021-11-08 DIAGNOSIS — R296 Repeated falls: Secondary | ICD-10-CM | POA: Diagnosis not present

## 2021-11-08 DIAGNOSIS — F32A Depression, unspecified: Secondary | ICD-10-CM | POA: Diagnosis not present

## 2021-11-08 DIAGNOSIS — Z791 Long term (current) use of non-steroidal anti-inflammatories (NSAID): Secondary | ICD-10-CM | POA: Diagnosis not present

## 2021-11-08 DIAGNOSIS — G500 Trigeminal neuralgia: Secondary | ICD-10-CM | POA: Diagnosis not present

## 2021-11-08 DIAGNOSIS — W19XXXD Unspecified fall, subsequent encounter: Secondary | ICD-10-CM | POA: Diagnosis not present

## 2021-11-08 DIAGNOSIS — S82034D Nondisplaced transverse fracture of right patella, subsequent encounter for closed fracture with routine healing: Secondary | ICD-10-CM | POA: Diagnosis not present

## 2021-11-08 DIAGNOSIS — E785 Hyperlipidemia, unspecified: Secondary | ICD-10-CM | POA: Diagnosis not present

## 2021-11-08 DIAGNOSIS — Z8781 Personal history of (healed) traumatic fracture: Secondary | ICD-10-CM | POA: Diagnosis not present

## 2021-11-08 DIAGNOSIS — Z9181 History of falling: Secondary | ICD-10-CM | POA: Diagnosis not present

## 2021-11-08 DIAGNOSIS — I10 Essential (primary) hypertension: Secondary | ICD-10-CM | POA: Diagnosis not present

## 2021-11-08 DIAGNOSIS — G35 Multiple sclerosis: Secondary | ICD-10-CM | POA: Diagnosis not present

## 2021-11-08 DIAGNOSIS — S2241XD Multiple fractures of ribs, right side, subsequent encounter for fracture with routine healing: Secondary | ICD-10-CM | POA: Diagnosis not present

## 2021-11-08 DIAGNOSIS — M47812 Spondylosis without myelopathy or radiculopathy, cervical region: Secondary | ICD-10-CM | POA: Diagnosis not present

## 2021-11-12 DIAGNOSIS — Z853 Personal history of malignant neoplasm of breast: Secondary | ICD-10-CM | POA: Diagnosis not present

## 2021-11-12 DIAGNOSIS — W19XXXD Unspecified fall, subsequent encounter: Secondary | ICD-10-CM | POA: Diagnosis not present

## 2021-11-12 DIAGNOSIS — G500 Trigeminal neuralgia: Secondary | ICD-10-CM | POA: Diagnosis not present

## 2021-11-12 DIAGNOSIS — S2241XD Multiple fractures of ribs, right side, subsequent encounter for fracture with routine healing: Secondary | ICD-10-CM | POA: Diagnosis not present

## 2021-11-12 DIAGNOSIS — M47812 Spondylosis without myelopathy or radiculopathy, cervical region: Secondary | ICD-10-CM | POA: Diagnosis not present

## 2021-11-12 DIAGNOSIS — I10 Essential (primary) hypertension: Secondary | ICD-10-CM | POA: Diagnosis not present

## 2021-11-12 DIAGNOSIS — G35 Multiple sclerosis: Secondary | ICD-10-CM | POA: Diagnosis not present

## 2021-11-12 DIAGNOSIS — R296 Repeated falls: Secondary | ICD-10-CM | POA: Diagnosis not present

## 2021-11-12 DIAGNOSIS — F32A Depression, unspecified: Secondary | ICD-10-CM | POA: Diagnosis not present

## 2021-11-12 DIAGNOSIS — Z8781 Personal history of (healed) traumatic fracture: Secondary | ICD-10-CM | POA: Diagnosis not present

## 2021-11-12 DIAGNOSIS — Z791 Long term (current) use of non-steroidal anti-inflammatories (NSAID): Secondary | ICD-10-CM | POA: Diagnosis not present

## 2021-11-12 DIAGNOSIS — S82034D Nondisplaced transverse fracture of right patella, subsequent encounter for closed fracture with routine healing: Secondary | ICD-10-CM | POA: Diagnosis not present

## 2021-11-12 DIAGNOSIS — E785 Hyperlipidemia, unspecified: Secondary | ICD-10-CM | POA: Diagnosis not present

## 2021-11-12 DIAGNOSIS — Z9181 History of falling: Secondary | ICD-10-CM | POA: Diagnosis not present

## 2021-11-13 DIAGNOSIS — F32A Depression, unspecified: Secondary | ICD-10-CM | POA: Diagnosis not present

## 2021-11-13 DIAGNOSIS — I10 Essential (primary) hypertension: Secondary | ICD-10-CM | POA: Diagnosis not present

## 2021-11-13 DIAGNOSIS — S82034D Nondisplaced transverse fracture of right patella, subsequent encounter for closed fracture with routine healing: Secondary | ICD-10-CM | POA: Diagnosis not present

## 2021-11-13 DIAGNOSIS — M47812 Spondylosis without myelopathy or radiculopathy, cervical region: Secondary | ICD-10-CM | POA: Diagnosis not present

## 2021-11-13 DIAGNOSIS — G35 Multiple sclerosis: Secondary | ICD-10-CM | POA: Diagnosis not present

## 2021-11-13 DIAGNOSIS — Z9181 History of falling: Secondary | ICD-10-CM | POA: Diagnosis not present

## 2021-11-13 DIAGNOSIS — W19XXXD Unspecified fall, subsequent encounter: Secondary | ICD-10-CM | POA: Diagnosis not present

## 2021-11-13 DIAGNOSIS — Z853 Personal history of malignant neoplasm of breast: Secondary | ICD-10-CM | POA: Diagnosis not present

## 2021-11-13 DIAGNOSIS — E785 Hyperlipidemia, unspecified: Secondary | ICD-10-CM | POA: Diagnosis not present

## 2021-11-13 DIAGNOSIS — Z791 Long term (current) use of non-steroidal anti-inflammatories (NSAID): Secondary | ICD-10-CM | POA: Diagnosis not present

## 2021-11-13 DIAGNOSIS — Z8781 Personal history of (healed) traumatic fracture: Secondary | ICD-10-CM | POA: Diagnosis not present

## 2021-11-13 DIAGNOSIS — G500 Trigeminal neuralgia: Secondary | ICD-10-CM | POA: Diagnosis not present

## 2021-11-13 DIAGNOSIS — R296 Repeated falls: Secondary | ICD-10-CM | POA: Diagnosis not present

## 2021-11-13 DIAGNOSIS — S2241XD Multiple fractures of ribs, right side, subsequent encounter for fracture with routine healing: Secondary | ICD-10-CM | POA: Diagnosis not present

## 2021-11-15 DIAGNOSIS — W19XXXD Unspecified fall, subsequent encounter: Secondary | ICD-10-CM | POA: Diagnosis not present

## 2021-11-15 DIAGNOSIS — M47812 Spondylosis without myelopathy or radiculopathy, cervical region: Secondary | ICD-10-CM | POA: Diagnosis not present

## 2021-11-15 DIAGNOSIS — Z791 Long term (current) use of non-steroidal anti-inflammatories (NSAID): Secondary | ICD-10-CM | POA: Diagnosis not present

## 2021-11-15 DIAGNOSIS — Z9181 History of falling: Secondary | ICD-10-CM | POA: Diagnosis not present

## 2021-11-15 DIAGNOSIS — F32A Depression, unspecified: Secondary | ICD-10-CM | POA: Diagnosis not present

## 2021-11-15 DIAGNOSIS — I10 Essential (primary) hypertension: Secondary | ICD-10-CM | POA: Diagnosis not present

## 2021-11-15 DIAGNOSIS — G35 Multiple sclerosis: Secondary | ICD-10-CM | POA: Diagnosis not present

## 2021-11-15 DIAGNOSIS — S82034D Nondisplaced transverse fracture of right patella, subsequent encounter for closed fracture with routine healing: Secondary | ICD-10-CM | POA: Diagnosis not present

## 2021-11-15 DIAGNOSIS — R296 Repeated falls: Secondary | ICD-10-CM | POA: Diagnosis not present

## 2021-11-15 DIAGNOSIS — E785 Hyperlipidemia, unspecified: Secondary | ICD-10-CM | POA: Diagnosis not present

## 2021-11-15 DIAGNOSIS — G500 Trigeminal neuralgia: Secondary | ICD-10-CM | POA: Diagnosis not present

## 2021-11-15 DIAGNOSIS — Z853 Personal history of malignant neoplasm of breast: Secondary | ICD-10-CM | POA: Diagnosis not present

## 2021-11-15 DIAGNOSIS — S2241XD Multiple fractures of ribs, right side, subsequent encounter for fracture with routine healing: Secondary | ICD-10-CM | POA: Diagnosis not present

## 2021-11-15 DIAGNOSIS — Z8781 Personal history of (healed) traumatic fracture: Secondary | ICD-10-CM | POA: Diagnosis not present

## 2021-11-16 DIAGNOSIS — S82001D Unspecified fracture of right patella, subsequent encounter for closed fracture with routine healing: Secondary | ICD-10-CM | POA: Diagnosis not present

## 2021-11-19 DIAGNOSIS — S82209A Unspecified fracture of shaft of unspecified tibia, initial encounter for closed fracture: Secondary | ICD-10-CM | POA: Diagnosis not present

## 2021-11-20 DIAGNOSIS — W19XXXD Unspecified fall, subsequent encounter: Secondary | ICD-10-CM | POA: Diagnosis not present

## 2021-11-20 DIAGNOSIS — F32A Depression, unspecified: Secondary | ICD-10-CM | POA: Diagnosis not present

## 2021-11-20 DIAGNOSIS — Z853 Personal history of malignant neoplasm of breast: Secondary | ICD-10-CM | POA: Diagnosis not present

## 2021-11-20 DIAGNOSIS — Z8781 Personal history of (healed) traumatic fracture: Secondary | ICD-10-CM | POA: Diagnosis not present

## 2021-11-20 DIAGNOSIS — Z791 Long term (current) use of non-steroidal anti-inflammatories (NSAID): Secondary | ICD-10-CM | POA: Diagnosis not present

## 2021-11-20 DIAGNOSIS — G35 Multiple sclerosis: Secondary | ICD-10-CM | POA: Diagnosis not present

## 2021-11-20 DIAGNOSIS — S2241XD Multiple fractures of ribs, right side, subsequent encounter for fracture with routine healing: Secondary | ICD-10-CM | POA: Diagnosis not present

## 2021-11-20 DIAGNOSIS — I10 Essential (primary) hypertension: Secondary | ICD-10-CM | POA: Diagnosis not present

## 2021-11-20 DIAGNOSIS — M47812 Spondylosis without myelopathy or radiculopathy, cervical region: Secondary | ICD-10-CM | POA: Diagnosis not present

## 2021-11-20 DIAGNOSIS — G500 Trigeminal neuralgia: Secondary | ICD-10-CM | POA: Diagnosis not present

## 2021-11-20 DIAGNOSIS — Z9181 History of falling: Secondary | ICD-10-CM | POA: Diagnosis not present

## 2021-11-20 DIAGNOSIS — S82034D Nondisplaced transverse fracture of right patella, subsequent encounter for closed fracture with routine healing: Secondary | ICD-10-CM | POA: Diagnosis not present

## 2021-11-20 DIAGNOSIS — E785 Hyperlipidemia, unspecified: Secondary | ICD-10-CM | POA: Diagnosis not present

## 2021-11-20 DIAGNOSIS — R296 Repeated falls: Secondary | ICD-10-CM | POA: Diagnosis not present

## 2021-11-21 ENCOUNTER — Other Ambulatory Visit: Payer: Self-pay | Admitting: Nurse Practitioner

## 2021-11-23 DIAGNOSIS — F32A Depression, unspecified: Secondary | ICD-10-CM | POA: Diagnosis not present

## 2021-11-23 DIAGNOSIS — S82034D Nondisplaced transverse fracture of right patella, subsequent encounter for closed fracture with routine healing: Secondary | ICD-10-CM | POA: Diagnosis not present

## 2021-11-23 DIAGNOSIS — G500 Trigeminal neuralgia: Secondary | ICD-10-CM | POA: Diagnosis not present

## 2021-11-23 DIAGNOSIS — Z9181 History of falling: Secondary | ICD-10-CM | POA: Diagnosis not present

## 2021-11-23 DIAGNOSIS — Z853 Personal history of malignant neoplasm of breast: Secondary | ICD-10-CM | POA: Diagnosis not present

## 2021-11-23 DIAGNOSIS — S2241XD Multiple fractures of ribs, right side, subsequent encounter for fracture with routine healing: Secondary | ICD-10-CM | POA: Diagnosis not present

## 2021-11-23 DIAGNOSIS — R296 Repeated falls: Secondary | ICD-10-CM | POA: Diagnosis not present

## 2021-11-23 DIAGNOSIS — Z8781 Personal history of (healed) traumatic fracture: Secondary | ICD-10-CM | POA: Diagnosis not present

## 2021-11-23 DIAGNOSIS — I10 Essential (primary) hypertension: Secondary | ICD-10-CM | POA: Diagnosis not present

## 2021-11-23 DIAGNOSIS — E785 Hyperlipidemia, unspecified: Secondary | ICD-10-CM | POA: Diagnosis not present

## 2021-11-23 DIAGNOSIS — M47812 Spondylosis without myelopathy or radiculopathy, cervical region: Secondary | ICD-10-CM | POA: Diagnosis not present

## 2021-11-23 DIAGNOSIS — W19XXXD Unspecified fall, subsequent encounter: Secondary | ICD-10-CM | POA: Diagnosis not present

## 2021-11-23 DIAGNOSIS — G35 Multiple sclerosis: Secondary | ICD-10-CM | POA: Diagnosis not present

## 2021-11-23 DIAGNOSIS — Z791 Long term (current) use of non-steroidal anti-inflammatories (NSAID): Secondary | ICD-10-CM | POA: Diagnosis not present

## 2021-11-26 DIAGNOSIS — W19XXXD Unspecified fall, subsequent encounter: Secondary | ICD-10-CM | POA: Diagnosis not present

## 2021-11-26 DIAGNOSIS — I10 Essential (primary) hypertension: Secondary | ICD-10-CM | POA: Diagnosis not present

## 2021-11-26 DIAGNOSIS — Z791 Long term (current) use of non-steroidal anti-inflammatories (NSAID): Secondary | ICD-10-CM | POA: Diagnosis not present

## 2021-11-26 DIAGNOSIS — E785 Hyperlipidemia, unspecified: Secondary | ICD-10-CM | POA: Diagnosis not present

## 2021-11-26 DIAGNOSIS — F32A Depression, unspecified: Secondary | ICD-10-CM | POA: Diagnosis not present

## 2021-11-26 DIAGNOSIS — S82034D Nondisplaced transverse fracture of right patella, subsequent encounter for closed fracture with routine healing: Secondary | ICD-10-CM | POA: Diagnosis not present

## 2021-11-26 DIAGNOSIS — Z8781 Personal history of (healed) traumatic fracture: Secondary | ICD-10-CM | POA: Diagnosis not present

## 2021-11-26 DIAGNOSIS — M47812 Spondylosis without myelopathy or radiculopathy, cervical region: Secondary | ICD-10-CM | POA: Diagnosis not present

## 2021-11-26 DIAGNOSIS — S2241XD Multiple fractures of ribs, right side, subsequent encounter for fracture with routine healing: Secondary | ICD-10-CM | POA: Diagnosis not present

## 2021-11-26 DIAGNOSIS — R296 Repeated falls: Secondary | ICD-10-CM | POA: Diagnosis not present

## 2021-11-26 DIAGNOSIS — G500 Trigeminal neuralgia: Secondary | ICD-10-CM | POA: Diagnosis not present

## 2021-11-26 DIAGNOSIS — Z853 Personal history of malignant neoplasm of breast: Secondary | ICD-10-CM | POA: Diagnosis not present

## 2021-11-26 DIAGNOSIS — Z9181 History of falling: Secondary | ICD-10-CM | POA: Diagnosis not present

## 2021-11-26 DIAGNOSIS — G35 Multiple sclerosis: Secondary | ICD-10-CM | POA: Diagnosis not present

## 2021-11-27 ENCOUNTER — Telehealth: Payer: Self-pay | Admitting: *Deleted

## 2021-11-27 NOTE — Telephone Encounter (Signed)
Claiborne Billings with [facility] called requesting verbal orders for Home Health PT 1X4weeks.   Verbal orders given.

## 2021-11-30 DIAGNOSIS — I10 Essential (primary) hypertension: Secondary | ICD-10-CM | POA: Diagnosis not present

## 2021-11-30 DIAGNOSIS — F32A Depression, unspecified: Secondary | ICD-10-CM | POA: Diagnosis not present

## 2021-11-30 DIAGNOSIS — S82034D Nondisplaced transverse fracture of right patella, subsequent encounter for closed fracture with routine healing: Secondary | ICD-10-CM | POA: Diagnosis not present

## 2021-11-30 DIAGNOSIS — M47812 Spondylosis without myelopathy or radiculopathy, cervical region: Secondary | ICD-10-CM | POA: Diagnosis not present

## 2021-11-30 DIAGNOSIS — E785 Hyperlipidemia, unspecified: Secondary | ICD-10-CM | POA: Diagnosis not present

## 2021-11-30 DIAGNOSIS — Z9181 History of falling: Secondary | ICD-10-CM | POA: Diagnosis not present

## 2021-11-30 DIAGNOSIS — W19XXXD Unspecified fall, subsequent encounter: Secondary | ICD-10-CM | POA: Diagnosis not present

## 2021-11-30 DIAGNOSIS — S2241XD Multiple fractures of ribs, right side, subsequent encounter for fracture with routine healing: Secondary | ICD-10-CM | POA: Diagnosis not present

## 2021-11-30 DIAGNOSIS — Z791 Long term (current) use of non-steroidal anti-inflammatories (NSAID): Secondary | ICD-10-CM | POA: Diagnosis not present

## 2021-11-30 DIAGNOSIS — G35 Multiple sclerosis: Secondary | ICD-10-CM | POA: Diagnosis not present

## 2021-11-30 DIAGNOSIS — Z8781 Personal history of (healed) traumatic fracture: Secondary | ICD-10-CM | POA: Diagnosis not present

## 2021-11-30 DIAGNOSIS — G500 Trigeminal neuralgia: Secondary | ICD-10-CM | POA: Diagnosis not present

## 2021-11-30 DIAGNOSIS — Z853 Personal history of malignant neoplasm of breast: Secondary | ICD-10-CM | POA: Diagnosis not present

## 2021-11-30 DIAGNOSIS — R296 Repeated falls: Secondary | ICD-10-CM | POA: Diagnosis not present

## 2021-12-03 DIAGNOSIS — I10 Essential (primary) hypertension: Secondary | ICD-10-CM | POA: Diagnosis not present

## 2021-12-03 DIAGNOSIS — Z8781 Personal history of (healed) traumatic fracture: Secondary | ICD-10-CM | POA: Diagnosis not present

## 2021-12-03 DIAGNOSIS — R296 Repeated falls: Secondary | ICD-10-CM | POA: Diagnosis not present

## 2021-12-03 DIAGNOSIS — M47812 Spondylosis without myelopathy or radiculopathy, cervical region: Secondary | ICD-10-CM | POA: Diagnosis not present

## 2021-12-03 DIAGNOSIS — E785 Hyperlipidemia, unspecified: Secondary | ICD-10-CM | POA: Diagnosis not present

## 2021-12-03 DIAGNOSIS — Z9181 History of falling: Secondary | ICD-10-CM | POA: Diagnosis not present

## 2021-12-03 DIAGNOSIS — Z791 Long term (current) use of non-steroidal anti-inflammatories (NSAID): Secondary | ICD-10-CM | POA: Diagnosis not present

## 2021-12-03 DIAGNOSIS — Z853 Personal history of malignant neoplasm of breast: Secondary | ICD-10-CM | POA: Diagnosis not present

## 2021-12-03 DIAGNOSIS — F32A Depression, unspecified: Secondary | ICD-10-CM | POA: Diagnosis not present

## 2021-12-03 DIAGNOSIS — S82034D Nondisplaced transverse fracture of right patella, subsequent encounter for closed fracture with routine healing: Secondary | ICD-10-CM | POA: Diagnosis not present

## 2021-12-03 DIAGNOSIS — W19XXXD Unspecified fall, subsequent encounter: Secondary | ICD-10-CM | POA: Diagnosis not present

## 2021-12-03 DIAGNOSIS — G500 Trigeminal neuralgia: Secondary | ICD-10-CM | POA: Diagnosis not present

## 2021-12-03 DIAGNOSIS — S2241XD Multiple fractures of ribs, right side, subsequent encounter for fracture with routine healing: Secondary | ICD-10-CM | POA: Diagnosis not present

## 2021-12-03 DIAGNOSIS — G35 Multiple sclerosis: Secondary | ICD-10-CM | POA: Diagnosis not present

## 2021-12-07 DIAGNOSIS — Z9181 History of falling: Secondary | ICD-10-CM | POA: Diagnosis not present

## 2021-12-07 DIAGNOSIS — R296 Repeated falls: Secondary | ICD-10-CM | POA: Diagnosis not present

## 2021-12-07 DIAGNOSIS — E785 Hyperlipidemia, unspecified: Secondary | ICD-10-CM | POA: Diagnosis not present

## 2021-12-07 DIAGNOSIS — F32A Depression, unspecified: Secondary | ICD-10-CM | POA: Diagnosis not present

## 2021-12-07 DIAGNOSIS — S2241XD Multiple fractures of ribs, right side, subsequent encounter for fracture with routine healing: Secondary | ICD-10-CM | POA: Diagnosis not present

## 2021-12-07 DIAGNOSIS — Z853 Personal history of malignant neoplasm of breast: Secondary | ICD-10-CM | POA: Diagnosis not present

## 2021-12-07 DIAGNOSIS — Z791 Long term (current) use of non-steroidal anti-inflammatories (NSAID): Secondary | ICD-10-CM | POA: Diagnosis not present

## 2021-12-07 DIAGNOSIS — Z8781 Personal history of (healed) traumatic fracture: Secondary | ICD-10-CM | POA: Diagnosis not present

## 2021-12-07 DIAGNOSIS — G500 Trigeminal neuralgia: Secondary | ICD-10-CM | POA: Diagnosis not present

## 2021-12-07 DIAGNOSIS — W19XXXD Unspecified fall, subsequent encounter: Secondary | ICD-10-CM | POA: Diagnosis not present

## 2021-12-07 DIAGNOSIS — I10 Essential (primary) hypertension: Secondary | ICD-10-CM | POA: Diagnosis not present

## 2021-12-07 DIAGNOSIS — G35 Multiple sclerosis: Secondary | ICD-10-CM | POA: Diagnosis not present

## 2021-12-07 DIAGNOSIS — S82034D Nondisplaced transverse fracture of right patella, subsequent encounter for closed fracture with routine healing: Secondary | ICD-10-CM | POA: Diagnosis not present

## 2021-12-07 DIAGNOSIS — M47812 Spondylosis without myelopathy or radiculopathy, cervical region: Secondary | ICD-10-CM | POA: Diagnosis not present

## 2021-12-10 ENCOUNTER — Telehealth: Payer: Self-pay

## 2021-12-10 DIAGNOSIS — Z791 Long term (current) use of non-steroidal anti-inflammatories (NSAID): Secondary | ICD-10-CM | POA: Diagnosis not present

## 2021-12-10 DIAGNOSIS — R296 Repeated falls: Secondary | ICD-10-CM | POA: Diagnosis not present

## 2021-12-10 DIAGNOSIS — S82034D Nondisplaced transverse fracture of right patella, subsequent encounter for closed fracture with routine healing: Secondary | ICD-10-CM | POA: Diagnosis not present

## 2021-12-10 DIAGNOSIS — S2241XD Multiple fractures of ribs, right side, subsequent encounter for fracture with routine healing: Secondary | ICD-10-CM | POA: Diagnosis not present

## 2021-12-10 DIAGNOSIS — M47812 Spondylosis without myelopathy or radiculopathy, cervical region: Secondary | ICD-10-CM | POA: Diagnosis not present

## 2021-12-10 DIAGNOSIS — G35 Multiple sclerosis: Secondary | ICD-10-CM | POA: Diagnosis not present

## 2021-12-10 DIAGNOSIS — F32A Depression, unspecified: Secondary | ICD-10-CM | POA: Diagnosis not present

## 2021-12-10 DIAGNOSIS — Z9181 History of falling: Secondary | ICD-10-CM | POA: Diagnosis not present

## 2021-12-10 DIAGNOSIS — E785 Hyperlipidemia, unspecified: Secondary | ICD-10-CM | POA: Diagnosis not present

## 2021-12-10 DIAGNOSIS — G500 Trigeminal neuralgia: Secondary | ICD-10-CM | POA: Diagnosis not present

## 2021-12-10 DIAGNOSIS — Z853 Personal history of malignant neoplasm of breast: Secondary | ICD-10-CM | POA: Diagnosis not present

## 2021-12-10 DIAGNOSIS — Z8781 Personal history of (healed) traumatic fracture: Secondary | ICD-10-CM | POA: Diagnosis not present

## 2021-12-10 DIAGNOSIS — I10 Essential (primary) hypertension: Secondary | ICD-10-CM | POA: Diagnosis not present

## 2021-12-10 DIAGNOSIS — W19XXXD Unspecified fall, subsequent encounter: Secondary | ICD-10-CM | POA: Diagnosis not present

## 2021-12-10 NOTE — Telephone Encounter (Signed)
Message left on clinical intake voicemail:   Patient called clinical intake questioning if it is ok for her to get her flu vaccine at [facility] and questions if someone could call back to discuss low blood pressure.   Message left on voicemail for patient to return call to schedule an appointment to discuss low b/p, I indicated patient can get flu vaccine at the same time or she can get her flu vaccine at [facility] (whichever she prefers)

## 2021-12-19 DIAGNOSIS — S82209A Unspecified fracture of shaft of unspecified tibia, initial encounter for closed fracture: Secondary | ICD-10-CM | POA: Diagnosis not present

## 2021-12-19 NOTE — Progress Notes (Unsigned)
   This service is provided via telemedicine  No vital signs collected/recorded due to the encounter was a telemedicine visit.   Location of patient (ex: home, work):  Home  Patient consents to a telephone visit: Yes, see telephone visit dated 12/20/2021  Location of the provider (ex: office, home):  [place], [place]   Name of any referring provider:  N/A  Names of all persons participating in the telemedicine service and their role in the encounter:  S.Chrae B/CMA, Sherrie Mustache, NP, and Patient   Time spent on call:  9 min with medical assistant

## 2021-12-20 ENCOUNTER — Other Ambulatory Visit: Payer: Self-pay | Admitting: Nurse Practitioner

## 2021-12-20 ENCOUNTER — Encounter: Payer: Self-pay | Admitting: Nurse Practitioner

## 2021-12-20 ENCOUNTER — Ambulatory Visit (INDEPENDENT_AMBULATORY_CARE_PROVIDER_SITE_OTHER): Payer: Medicare Other | Admitting: Nurse Practitioner

## 2021-12-20 ENCOUNTER — Telehealth: Payer: Self-pay

## 2021-12-20 DIAGNOSIS — Z0000 Encounter for general adult medical examination without abnormal findings: Secondary | ICD-10-CM

## 2021-12-20 DIAGNOSIS — E2839 Other primary ovarian failure: Secondary | ICD-10-CM | POA: Diagnosis not present

## 2021-12-20 NOTE — Patient Instructions (Signed)
Crystal Huber , Thank you for taking time to come for your Medicare Wellness Visit. I appreciate your ongoing commitment to your health goals. Please review the following plan we discussed and let me know if I can assist you in the future.   Screening recommendations/referrals: Colonoscopy due- she has schedule Mammogram aged out Bone Density order placed today- [phone] Recommended yearly ophthalmology/optometry visit for glaucoma screening and checkup Recommended yearly dental visit for hygiene and checkup  Vaccinations: Influenza vaccine- due annually in September/October Pneumococcal vaccine up to date Tdap vaccine DUE- recommend to get at your local pharmacy Shingles vaccine DUE- recommend to get at your local pharmacy    Advanced directives: on file.   Conditions/risks identified: advanced age  Next appointment: yearly- in office    Preventive Care 76 Years and Older, Female Preventive care refers to lifestyle choices and visits with your health care provider that can promote health and wellness. What does preventive care include? A yearly physical exam. This is also called an annual well check. Dental exams once or twice a year. Routine eye exams. Ask your health care provider how often you should have your eyes checked. Personal lifestyle choices, including: Daily care of your teeth and gums. Regular physical activity. Eating a healthy diet. Avoiding tobacco and drug use. Limiting alcohol use. Practicing safe sex. Taking low-dose aspirin every day. Taking vitamin and mineral supplements as recommended by your health care provider. What happens during an annual well check? The services and screenings done by your health care provider during your annual well check will depend on your age, overall health, lifestyle risk factors, and family history of disease. Counseling  Your health care provider may ask you questions about your: Alcohol use. Tobacco use. Drug  use. Emotional well-being. Home and relationship well-being. Sexual activity. Eating habits. History of falls. Memory and ability to understand (cognition). Work and work Statistician. Reproductive health. Screening  You may have the following tests or measurements: Height, weight, and BMI. Blood pressure. Lipid and cholesterol levels. These may be checked every 5 years, or more frequently if you are over 24 years old. Skin check. Lung cancer screening. You may have this screening every year starting at age 76 if you have a 30-pack-year history of smoking and currently smoke or have quit within the past 15 years. Fecal occult blood test (FOBT) of the stool. You may have this test every year starting at age 76. Flexible sigmoidoscopy or colonoscopy. You may have a sigmoidoscopy every 5 years or a colonoscopy every 10 years starting at age 76. Hepatitis C blood test. Hepatitis B blood test. Sexually transmitted disease (STD) testing. Diabetes screening. This is done by checking your blood sugar (glucose) after you have not eaten for a while (fasting). You may have this done every 1-3 years. Bone density scan. This is done to screen for osteoporosis. You may have this done starting at age 76. Mammogram. This may be done every 1-2 years. Talk to your health care provider about how often you should have regular mammograms. Talk with your health care provider about your test results, treatment options, and if necessary, the need for more tests. Vaccines  Your health care provider may recommend certain vaccines, such as: Influenza vaccine. This is recommended every year. Tetanus, diphtheria, and acellular pertussis (Tdap, Td) vaccine. You may need a Td booster every 10 years. Zoster vaccine. You may need this after age 27. Pneumococcal 13-valent conjugate (PCV13) vaccine. One dose is recommended after age 15. Pneumococcal polysaccharide (  PPSV23) vaccine. One dose is recommended after age  76. Talk to your health care provider about which screenings and vaccines you need and how often you need them. This information is not intended to replace advice given to you by your health care provider. Make sure you discuss any questions you have with your health care provider. Document Released: 03/31/2015 Document Revised: 11/22/2015 Document Reviewed: 01/03/2015 Elsevier Interactive Patient Education  2017 [place] Prevention in the Home Falls can cause injuries. They can happen to people of all ages. There are many things you can do to make your home safe and to help prevent falls. What can I do on the outside of my home? Regularly fix the edges of walkways and driveways and fix any cracks. Remove anything that might make you trip as you walk through a door, such as a raised step or threshold. Trim any bushes or trees on the path to your home. Use bright outdoor lighting. Clear any walking paths of anything that might make someone trip, such as rocks or tools. Regularly check to see if handrails are loose or broken. Make sure that both sides of any steps have handrails. Any raised decks and porches should have guardrails on the edges. Have any leaves, snow, or ice cleared regularly. Use sand or salt on walking paths during winter. Clean up any spills in your garage right away. This includes oil or grease spills. What can I do in the bathroom? Use night lights. Install grab bars by the toilet and in the tub and shower. Do not use towel bars as grab bars. Use non-skid mats or decals in the tub or shower. If you need to sit down in the shower, use a plastic, non-slip stool. Keep the floor dry. Clean up any water that spills on the floor as soon as it happens. Remove soap buildup in the tub or shower regularly. Attach bath mats securely with double-sided non-slip rug tape. Do not have throw rugs and other things on the floor that can make you trip. What can I do in the  bedroom? Use night lights. Make sure that you have a light by your bed that is easy to reach. Do not use any sheets or blankets that are too big for your bed. They should not hang down onto the floor. Have a firm chair that has side arms. You can use this for support while you get dressed. Do not have throw rugs and other things on the floor that can make you trip. What can I do in the kitchen? Clean up any spills right away. Avoid walking on wet floors. Keep items that you use a lot in easy-to-reach places. If you need to reach something above you, use a strong step stool that has a grab bar. Keep electrical cords out of the way. Do not use floor polish or wax that makes floors slippery. If you must use wax, use non-skid floor wax. Do not have throw rugs and other things on the floor that can make you trip. What can I do with my stairs? Do not leave any items on the stairs. Make sure that there are handrails on both sides of the stairs and use them. Fix handrails that are broken or loose. Make sure that handrails are as long as the stairways. Check any carpeting to make sure that it is firmly attached to the stairs. Fix any carpet that is loose or worn. Avoid having throw rugs at the top or  bottom of the stairs. If you do have throw rugs, attach them to the floor with carpet tape. Make sure that you have a light switch at the top of the stairs and the bottom of the stairs. If you do not have them, ask someone to add them for you. What else can I do to help prevent falls? Wear shoes that: Do not have high heels. Have rubber bottoms. Are comfortable and fit you well. Are closed at the toe. Do not wear sandals. If you use a stepladder: Make sure that it is fully opened. Do not climb a closed stepladder. Make sure that both sides of the stepladder are locked into place. Ask someone to hold it for you, if possible. Clearly mark and make sure that you can see: Any grab bars or  handrails. First and last steps. Where the edge of each step is. Use tools that help you move around (mobility aids) if they are needed. These include: Canes. Walkers. Scooters. Crutches. Turn on the lights when you go into a dark area. Replace any light bulbs as soon as they burn out. Set up your furniture so you have a clear path. Avoid moving your furniture around. If any of your floors are uneven, fix them. If there are any pets around you, be aware of where they are. Review your medicines with your doctor. Some medicines can make you feel dizzy. This can increase your chance of falling. Ask your doctor what other things that you can do to help prevent falls. This information is not intended to replace advice given to you by your health care provider. Make sure you discuss any questions you have with your health care provider. Document Released: 12/29/2008 Document Revised: 08/10/2015 Document Reviewed: 04/08/2014 Elsevier Interactive Patient Education  2017 [employer].

## 2021-12-20 NOTE — Telephone Encounter (Signed)
[name], cerrato are scheduled for a virtual visit with your provider today.    Just as we do with appointments in the office, we must obtain your consent to participate.  Your consent will be active for this visit and any virtual visit you may have with one of our providers in the next 365 days.    If you have a MyChart account, I can also send a copy of this consent to you electronically.  All virtual visits are billed to your insurance company just like a traditional visit in the office.  As this is a virtual visit, video technology does not allow for your provider to perform a traditional examination.  This may limit your provider's ability to fully assess your condition.  If your provider identifies any concerns that need to be evaluated in person or the need to arrange testing such as labs, EKG, etc, we will make arrangements to do so.    Although advances in technology are sophisticated, we cannot ensure that it will always work on either your end or our end.  If the connection with a video visit is poor, we may have to switch to a telephone visit.  With either a video or telephone visit, we are not always able to ensure that we have a secure connection.   I need to obtain your verbal consent now.   Are you willing to proceed with your visit today?   Crystal Huber has provided verbal consent on 12/20/2021 for a virtual visit (video or telephone).   Leigh [place], [place] 12/20/2021  9:46 AM

## 2021-12-20 NOTE — Telephone Encounter (Signed)
Patient medication Celexa has High risk warnings. Medication pend and sent to PCP Dewaine Oats [place], NP

## 2021-12-20 NOTE — Progress Notes (Signed)
Subjective:   Crystal Huber is a 76 y.o. female who presents for Medicare Annual (Subsequent) preventive examination.  Review of Systems           Objective:    There were no vitals filed for this visit. There is no height or weight on file to calculate BMI.     12/20/2021    8:00 AM 09/30/2021    4:11 AM 09/30/2021    4:00 AM 09/27/2021   11:03 AM 09/24/2021    5:16 PM 08/15/2021   12:13 PM 07/29/2021   11:35 AM  Advanced Directives  Does Patient Have a Medical Advance Directive? Yes  No Yes Yes Yes Yes  Type of Paramedic of [place];Living will   [place];Living will Living will;Healthcare Power of Attorney Living will;Healthcare Power of Attorney   Does patient want to make changes to medical advance directive? No - Patient declined  No - Patient declined No - Patient declined     Copy of [place] in Chart? Yes - validated most recent copy scanned in chart (See row information)   No - copy requested     Would patient like information on creating a medical advance directive?  No - Guardian declined         Current Medications (verified) Outpatient Encounter Medications as of 12/20/2021  Medication Sig   Biotin w/ Vitamins C & E (HAIR/SKIN/NAILS PO) Take 1 tablet by mouth daily.   citalopram (CELEXA) 20 MG tablet Take 1.5 tablets (30 mg total) by mouth daily.   diphenhydrAMINE-zinc acetate (BENADRYL) cream Apply topically 3 (three) times daily as needed for itching.   gabapentin (NEURONTIN) 300 MG capsule Take 1 or 2 pills p.o. 3 times a day (up to 5 a day)   hydrALAZINE (APRESOLINE) 50 MG tablet TAKE ONE TABLET BY MOUTH TWICE DAILY   lamoTRIgine (LAMICTAL) 150 MG tablet Take 1 tablet (150 mg total) by mouth 3 (three) times daily.   latanoprost (XALATAN) 0.005 % ophthalmic solution Place 1 drop into both eyes at bedtime.   lip balm (CARMEX) ointment Apply 1 Application topically as needed for lip care (cold sores).    losartan (COZAAR) 50 MG tablet Take 1 tablet (50 mg total) by mouth daily.   meclizine (ANTIVERT) 25 MG tablet Take 25 mg by mouth daily.   metoprolol succinate (TOPROL-XL) 50 MG 24 hr tablet TAKE ONE TABLET BY MOUTH ONE TIME DAILY   Multiple Vitamin (MULTIVITAMIN) tablet Take 1 tablet by mouth daily.   naproxen sodium (ALEVE) 220 MG tablet Take 220 mg by mouth as needed (For headaches or generalized pain).   sodium chloride (OCEAN) 0.65 % SOLN nasal spray Place 1 spray into both nostrils as needed for congestion (nose irritation).   timolol (TIMOPTIC) 0.5 % ophthalmic solution Place 1 drop into both eyes every morning.   [DISCONTINUED] naproxen (NAPROSYN) 500 MG tablet Take 1 tablet (500 mg total) by mouth 2 (two) times daily with a meal.   [DISCONTINUED] acetaminophen (TYLENOL) 325 MG tablet Take 2 tablets (650 mg total) by mouth every 6 (six) hours as needed for mild pain (or Fever >/= 101).   [DISCONTINUED] ondansetron (ZOFRAN) 4 MG tablet Take 1 tablet (4 mg total) by mouth every 6 (six) hours as needed for nausea. (Patient not taking: Reported on 12/20/2021)   No facility-administered encounter medications on file as of 12/20/2021.    Allergies (verified) Carbamazepine, Imipramine, Metronidazole, Oxcarbazepine, Penicillins, Red blood cells, Statins,  and Sulfa antibiotics   History: Past Medical History:  Diagnosis Date   Arthritis    right pinkie   Cancer ([place])    right BR  CA    Cataract    right eye   GERD (gastroesophageal reflux disease)    prn tums, mild not often   Glaucoma    Hyperlipidemia    Hypertension    Multiple sclerosis ([place])    Neuromuscular disorder (HCC)    trigeminal neuralgia   Refusal of blood product    Trigeminal neuralgia of left side of face    Past Surgical History:  Procedure Laterality Date   BREAST LUMPECTOMY Right    wears prosthesis   COLONOSCOPY  12/20/2010   ganglion cyst removal Bilateral    PARTIAL HYSTERECTOMY     POLYPECTOMY      Family History  Problem Relation Age of Onset   Congestive Heart Failure Mother    Heart disease Father    Heart attack Father    Stroke Sister    Breast cancer Sister    Heart disease Sister    Heart disease Brother    Heart disease Brother    Heart disease Brother    Colon cancer Neg Hx    Colon polyps Neg Hx    Social History   Socioeconomic History   Marital status: Widowed    Spouse name: Not on file   Number of children: Not on file   Years of education: Not on file   Highest education level: Not on file  Occupational History   Not on file  Tobacco Use   Smoking status: Former    Years: 1.50    Types: Cigarettes   Smokeless tobacco: Never   Tobacco comments:    Quit smoking at age 21 and only smoke occasional x 1.5 years   Vaping Use   Vaping Use: Never used  Substance and Sexual Activity   Alcohol use: Not Currently   Drug use: No   Sexual activity: Not on file  Other Topics Concern   Not on file  Social History Narrative   Diet      Do you drink/eat things with caffeine      Marital Status     What year were you married?      Do you live in a house, apartment, assisted living, condo, trailer, etc.?      Is it one or more stories?      How many persons live in your home?         Do you have any pets in your home?(please list)      Highest level of education completed:      Current or past profession:      Do you exercise?:    Type and how often:      Do you have a Living Will? (Form that indicates scenarios where you would not want your life prolonged)      Do you have a DNR form?         If not, would you like to discuss one?      Do you have signed POA/HPOA forms? Yes      Do you have difficulty bathing or dressing yourself?      Do you have difficulty preparing food or eating?      Do you have difficulty managing medications?      Do you have difficulty managing your finances?      Do you  have difficulty affording your  medications?                     Social Determinants of Health   Financial Resource Strain: Not on file  Food Insecurity: Not on file  Transportation Needs: Not on file  Physical Activity: Not on file  Stress: Not on file  Social Connections: Not on file    Tobacco Counseling Counseling given: Not Answered Tobacco comments: Quit smoking at age 70 and only smoke occasional x 1.5 years    Clinical Intake:                 Diabetic?no         Activities of Daily Living    09/29/2021    9:34 PM  In your present state of health, do you have any difficulty performing the following activities:  Hearing? 0  Vision? 0  Difficulty concentrating or making decisions? 0  Walking or climbing stairs? 1  Dressing or bathing? 0  Doing errands, shopping? 0    Patient Care Team: [name], NP as PCP - General (Geriatric Medicine)  Indicate any recent Medical Services you may have received from other than [facility] providers in the past year (date may be approximate).     Assessment:   This is a routine wellness examination for Evony.  Hearing/Vision screen Hearing Screening - Comments:: Patient states she has hearing aids for both ears, although the left is of concern.  Vision Screening - Comments:: Last eye exam less than 12 months ago, unable to spell her name, call her Dr.R, location [facility] on [address]. Pending eye exam for November 2023  Dietary issues and exercise activities discussed:     Goals Addressed   None    Depression Screen    12/20/2021    9:37 AM 06/25/2021    1:07 PM 12/12/2020   10:17 AM  PHQ 2/9 Scores  PHQ - 2 Score 0 0 0    Fall Risk    12/20/2021    9:37 AM 06/25/2021    1:06 PM 12/12/2020   10:16 AM  [place] in the past year? 1 0 1  Number falls in past yr: 0 0 0  Injury with Fall? 1 0 1  Comment   Strained knee  Risk for fall due to : History of fall(s) No Fall Risks History of fall(s)  Follow up Falls  evaluation completed Falls evaluation completed Falls evaluation completed    [place]:  Any stairs in or around the home? No  If so, are there any without handrails?  na Home free of loose throw rugs in walkways, pet beds, electrical cords, etc? No  Adequate lighting in your home to reduce risk of falls? Yes   ASSISTIVE DEVICES UTILIZED TO PREVENT FALLS:  Life alert? No  Use of a cane, walker or w/c? Yes  Grab bars in the bathroom? Yes  Shower chair or bench in shower? Yes  Elevated toilet seat or a handicapped toilet? Yes   TIMED UP AND GO:  Was the test performed? No .    Cognitive Function:    03/27/2020    2:03 PM 03/27/2020    1:40 PM  MMSE - Mini Mental State Exam  Orientation to time 3 3  Orientation to Place 5 5  Registration 3 3  Attention/ Calculation 2 2  Recall 3 3  Language- name 2 objects  2 2  Language- repeat 1 1  Language- follow 3 step command 3 3  Language- read & follow direction 1 1  Write a sentence 1 1  Copy design 1 1  Total score 25 25        12/20/2021    9:41 AM 12/12/2020   10:20 AM  6CIT Screen  What Year? 0 points 0 points  What month? 0 points 0 points  What time? 0 points 0 points  Count back from 20 0 points 0 points  Months in reverse 2 points 0 points  Repeat phrase 0 points 0 points  Total Score 2 points 0 points    Immunizations Immunization History  Administered Date(s) Administered   Fluad Quad(high Dose 65+) 12/18/2020   Influenza Split 02/25/2013, 12/20/2013, 01/16/2016   Influenza, High Dose Seasonal PF 01/22/2017, 12/18/2018, 01/26/2020, 12/19/2021   Influenza,inj,Quad PF,6+ Mos 01/16/2016   Influenza,inj,quad, With Preservative 12/20/2013   Influenza-Unspecified 01/10/2010, 02/04/2011, 03/17/2012   PFIZER Comirnaty(Gray Top)Covid-19 Tri-Sucrose Vaccine 09/07/2020   PFIZER(Purple Top)SARS-COV-2 Vaccination 07/14/2019, 09/23/2019, 12/28/2019   Pneumococcal Conjugate-13  11/11/2016   Pneumococcal Polysaccharide-23 11/11/2011   Tdap 10/16/2010    TDAP status: Due, Education has been provided regarding the importance of this vaccine. Advised may receive this vaccine at local pharmacy or Health Dept. Aware to provide a copy of the vaccination record if obtained from local pharmacy or Health Dept. Verbalized acceptance and understanding.  Flu Vaccine status: Up to date  Pneumococcal vaccine status: Up to date  Covid-19 vaccine status: Information provided on how to obtain vaccines.   Qualifies for Shingles Vaccine? Yes   Zostavax completed Yes   Shingrix Completed?: No.    Education has been provided regarding the importance of this vaccine. Patient has been advised to call insurance company to determine out of pocket expense if they have not yet received this vaccine. Advised may also receive vaccine at local pharmacy or Health Dept. Verbalized acceptance and understanding.  Screening Tests Health Maintenance  Topic Date Due   Zoster Vaccines- Shingrix (1 of 2) Never done   DEXA SCAN  Never done   COLONOSCOPY (Pts 45-62yr Insurance coverage will need to be confirmed)  05/01/2019   TETANUS/TDAP  10/15/2020   COVID-19 Vaccine (5 - Pfizer risk series) 11/02/2020   Pneumonia Vaccine 76 Years old  Completed   INFLUENZA VACCINE  Completed   Hepatitis C Screening  Completed   HPV VACCINES  Aged Out    Health Maintenance  Health Maintenance Due  Topic Date Due   Zoster Vaccines- Shingrix (1 of 2) Never done   DEXA SCAN  Never done   COLONOSCOPY (Pts 45-425yrInsurance coverage will need to be confirmed)  05/01/2019   TETANUS/TDAP  10/15/2020   COVID-19 Vaccine (5 - Pfizer risk series) 11/02/2020    Colorectal cancer screening: Type of screening: Colonoscopy. Completed 2018. Repeat every 3 years Has colonoscopy scheduled.  Mammogram status: No longer required due to aged out.  Bone Density status: Ordered today. Pt provided with contact info and  advised to call to schedule appt.  Lung Cancer Screening: (Low Dose CT Chest recommended if Age 76-80ears, 30 pack-year currently smoking OR have quit w/in 15years.) does not qualify.   Lung Cancer Screening Referral: na  Additional Screening:  Hepatitis C Screening: does qualify; Completed  Vision Screening: Recommended annual ophthalmology exams for early detection of glaucoma and other disorders of the eye. Is the patient up to date with their annual eye exam?  Yes  Who is the provider or what is the name of the office in which the patient attends annual eye exams? DR R If pt is not established with a provider, would they like to be referred to a provider to establish care? No .   Dental Screening: Recommended annual dental exams for proper oral hygiene  Community Resource Referral / Chronic Care Management: CRR required this visit?  No   CCM required this visit?  No      Plan:     I have personally reviewed and noted the following in the patient's chart:   Medical and social history Use of alcohol, tobacco or illicit drugs  Current medications and supplements including opioid prescriptions. Patient is not currently taking opioid prescriptions. Functional ability and status Nutritional status Physical activity Advanced directives List of other physicians Hospitalizations, surgeries, and ER visits in previous 12 months Vitals Screenings to include cognitive, depression, and falls Referrals and appointments  In addition, I have reviewed and discussed with patient certain preventive protocols, quality metrics, and best practice recommendations. A written personalized care plan for preventive services as well as general preventive health recommendations were provided to patient.     [name], NP   12/20/2021    Virtual Visit via Telephone Note  I connected with patient 12/20/21 at  9:40 AM EDT by telephone and verified that I am speaking with the correct  person using two identifiers.  Location: Patient: home Provider: [facility] [place]    I discussed the limitations, risks, security and privacy concerns of performing an evaluation and management service by telephone and the availability of in person appointments. I also discussed with the patient that there may be a patient responsible charge related to this service. The patient expressed understanding and agreed to proceed.   I discussed the assessment and treatment plan with the patient. The patient was provided an opportunity to ask questions and all were answered. The patient agreed with the plan and demonstrated an understanding of the instructions.   The patient was advised to call back or seek an in-person evaluation if the symptoms worsen or if the condition fails to improve as anticipated.  I provided 15 minutes of non-face-to-face time during this encounter.  Carlos American. Harle Battiest Avs printed and mailed

## 2021-12-28 DIAGNOSIS — S82001D Unspecified fracture of right patella, subsequent encounter for closed fracture with routine healing: Secondary | ICD-10-CM | POA: Diagnosis not present

## 2021-12-31 ENCOUNTER — Ambulatory Visit: Payer: Medicare Other | Admitting: Family

## 2022-01-07 ENCOUNTER — Other Ambulatory Visit: Payer: Self-pay | Admitting: Nurse Practitioner

## 2022-01-07 NOTE — Telephone Encounter (Signed)
Patient hasn't had Lipid Panel in 1 year. Patient has upcoming appointment 01/14/2022. Medication pend and sent to PCP [name], NP .

## 2022-01-10 DIAGNOSIS — Z9011 Acquired absence of right breast and nipple: Secondary | ICD-10-CM | POA: Diagnosis not present

## 2022-01-14 ENCOUNTER — Encounter: Payer: Self-pay | Admitting: Adult Health

## 2022-01-14 ENCOUNTER — Ambulatory Visit: Payer: Medicare Other | Admitting: Nurse Practitioner

## 2022-01-14 ENCOUNTER — Ambulatory Visit: Payer: Medicare Other | Admitting: Adult Health

## 2022-01-14 VITALS — BP 110/70 | HR 48 | Temp 97.3°F | Resp 18 | Ht 63.0 in | Wt 129.0 lb

## 2022-01-14 DIAGNOSIS — F413 Other mixed anxiety disorders: Secondary | ICD-10-CM | POA: Diagnosis not present

## 2022-01-14 DIAGNOSIS — H409 Unspecified glaucoma: Secondary | ICD-10-CM

## 2022-01-14 DIAGNOSIS — G35 Multiple sclerosis: Secondary | ICD-10-CM

## 2022-01-14 DIAGNOSIS — I10 Essential (primary) hypertension: Secondary | ICD-10-CM | POA: Diagnosis not present

## 2022-01-14 DIAGNOSIS — R42 Dizziness and giddiness: Secondary | ICD-10-CM

## 2022-01-14 DIAGNOSIS — G500 Trigeminal neuralgia: Secondary | ICD-10-CM

## 2022-01-14 MED ORDER — CITALOPRAM HYDROBROMIDE 20 MG PO TABS: 30.0000 mg | ORAL_TABLET | Freq: Every day | ORAL | 3 refills | Status: DC

## 2022-01-14 MED ORDER — CITALOPRAM HYDROBROMIDE 20 MG PO TABS: 20.0000 mg | ORAL_TABLET | Freq: Every day | ORAL | 3 refills | Status: DC

## 2022-01-14 NOTE — Progress Notes (Signed)
[facility] clinic  Provider: Durenda Age DNP  Code Status:  Full Code  Goals of Care:     01/14/2022   11:47 AM  Advanced Directives  Does Patient Have a Medical Advance Directive? Yes  Type of Paramedic of [place];Living will  Does patient want to make changes to medical advance directive? No - Patient declined  Copy of [place] in Chart? Yes - validated most recent copy scanned in chart (See row information)     Chief Complaint  Patient presents with   Medical Management of Chronic Issues    Patient is here for a 30 M F/U    HPI: Patient is a 76 y.o. female seen today for management of chronic illnesses. She has a PMH of relapsing remitting multiple sclerosis, trigeminal neuralgia, hypertension, hyperlipidemia, right breast cancer, GAD and alcohol use.   Primary hypertension  -  BP 110/70,  takes Hydralazine, Losartan and Metoprolol succinate  Trigeminal neuralgia  -  takes Neurontin and Lamictal   Other mixed anxiety disorders -  takes Celexa 30 mg daily   Past Medical History:  Diagnosis Date   Arthritis    right pinkie   Cancer ([place])    right BR  CA    Cataract    right eye   GERD (gastroesophageal reflux disease)    prn tums, mild not often   Glaucoma    Hyperlipidemia    Hypertension    Multiple sclerosis ([place])    Neuromuscular disorder (HCC)    trigeminal neuralgia   Refusal of blood product    Trigeminal neuralgia of left side of face     Past Surgical History:  Procedure Laterality Date   BREAST LUMPECTOMY Right    wears prosthesis   COLONOSCOPY  12/20/2010   ganglion cyst removal Bilateral    PARTIAL HYSTERECTOMY     POLYPECTOMY      Allergies  Allergen Reactions   Carbamazepine     Decreased sodium level   Imipramine     Night sweats, more intense dreams   Metronidazole Nausea Only   Oxcarbazepine     Decreased sodium level   Penicillins Swelling    Yeast infection   Red Blood  Cells Other (See Comments)    Jehovah's witness, does not want blood products   Statins     Gets hyponatremia and severe muscle pain   Sulfa Antibiotics Swelling    yeast infection    Outpatient Encounter Medications as of 01/14/2022  Medication Sig   Biotin w/ Vitamins C & E (HAIR/SKIN/NAILS PO) Take 1 tablet by mouth daily.   citalopram (CELEXA) 20 MG tablet Take 1.5 tablets (30 mg total) by mouth daily.   gabapentin (NEURONTIN) 300 MG capsule Take 1 or 2 pills p.o. 3 times a day (up to 5 a day)   hydrALAZINE (APRESOLINE) 50 MG tablet TAKE ONE TABLET BY MOUTH TWICE DAILY   latanoprost (XALATAN) 0.005 % ophthalmic solution Place 1 drop into both eyes at bedtime.   losartan (COZAAR) 50 MG tablet Take 1 tablet (50 mg total) by mouth daily.   meclizine (ANTIVERT) 25 MG tablet Take 25 mg by mouth daily.   metoprolol succinate (TOPROL-XL) 50 MG 24 hr tablet TAKE ONE TABLET BY MOUTH ONE TIME DAILY   Multiple Vitamin (MULTIVITAMIN) tablet Take 1 tablet by mouth daily.   timolol (TIMOPTIC) 0.5 % ophthalmic solution Place 1 drop into both eyes every morning.   [DISCONTINUED] citalopram (CELEXA) 20 MG  tablet TAKE ONE AND ONE-HALF TABLETS BY MOUTH DAILY   [DISCONTINUED] diphenhydrAMINE-zinc acetate (BENADRYL) cream Apply topically 3 (three) times daily as needed for itching.   [DISCONTINUED] lamoTRIgine (LAMICTAL) 150 MG tablet Take 1 tablet (150 mg total) by mouth 3 (three) times daily.   [DISCONTINUED] lip balm (CARMEX) ointment Apply 1 Application topically as needed for lip care (cold sores).   [DISCONTINUED] naproxen sodium (ALEVE) 220 MG tablet Take 220 mg by mouth as needed (For headaches or generalized pain).   [DISCONTINUED] sodium chloride (OCEAN) 0.65 % SOLN nasal spray Place 1 spray into both nostrils as needed for congestion (nose irritation).   No facility-administered encounter medications on file as of 01/14/2022.    Review of Systems:  Review of Systems  Constitutional:   Negative for appetite change, chills, fatigue and fever.  HENT:  Negative for congestion, hearing loss, rhinorrhea and sore throat.   Eyes: Negative.   Respiratory:  Negative for cough, shortness of breath and wheezing.   Cardiovascular:  Negative for chest pain, palpitations and leg swelling.  Gastrointestinal:  Negative for abdominal pain, constipation, diarrhea, nausea and vomiting.  Genitourinary:  Negative for dysuria.  Musculoskeletal:  Negative for arthralgias, back pain and myalgias.  Skin:  Negative for color change, rash and wound.  Neurological:  Negative for dizziness, weakness and headaches.  Psychiatric/Behavioral:  Negative for behavioral problems. The patient is not nervous/anxious.     Health Maintenance  Topic Date Due   Zoster Vaccines- Shingrix (1 of 2) Never done   DEXA SCAN  Never done   COLONOSCOPY (Pts 45-80yr Insurance coverage will need to be confirmed)  05/01/2019   TETANUS/TDAP  10/15/2020   COVID-19 Vaccine (6 - Pfizer risk series) 02/20/2022   Medicare Annual Wellness (AWV)  12/21/2022   Pneumonia Vaccine 76 Years old  Completed   INFLUENZA VACCINE  Completed   Hepatitis C Screening  Completed   HPV VACCINES  Aged Out    Physical Exam: Vitals:   01/14/22 1146  BP: 110/70  Pulse: (!) 48  Resp: 18  Temp: (!) 97.3 F (36.3 C)  SpO2: 96%  Weight: 129 lb (58.5 kg)  Height: '5\' 3"'$  (1.6 m)   Body mass index is 22.85 kg/m. Physical Exam Constitutional:      Appearance: Normal appearance.  HENT:     Head: Normocephalic and atraumatic.     Nose: Nose normal.     Mouth/Throat:     Mouth: Mucous membranes are moist.  Eyes:     Conjunctiva/sclera: Conjunctivae normal.  Cardiovascular:     Rate and Rhythm: Normal rate and regular rhythm.  Pulmonary:     Effort: Pulmonary effort is normal.     Breath sounds: Normal breath sounds.  Abdominal:     General: Bowel sounds are normal.     Palpations: Abdomen is soft.  Musculoskeletal:         General: Normal range of motion.     Cervical back: Normal range of motion.  Skin:    General: Skin is warm and dry.  Neurological:     General: No focal deficit present.     Mental Status: She is alert and oriented to person, place, and time.  Psychiatric:        Mood and Affect: Mood normal.        Behavior: Behavior normal.        Thought Content: Thought content normal.        Judgment: Judgment normal.    Labs  reviewed: Basic Metabolic Panel: Recent Labs    10/01/21 0200 10/02/21 0253 10/03/21 0222 10/04/21 0352 10/05/21 0521  NA 134* 132* 134* 136  --   K 3.9 3.9 3.8 3.9  --   CL 96* 99 101 106  --   CO2 '24 28 26 24  '$ --   GLUCOSE 81 98 98 88  --   BUN '18 14 15 11  '$ --   CREATININE 0.84 0.76 0.68 0.72 0.71  CALCIUM 9.3 9.2 9.1 8.9  --   MG 1.9 2.1 1.9  --   --   PHOS 3.3 3.0 3.1  --   --    Liver Function Tests: Recent Labs    10/01/21 0200 10/02/21 0253 10/03/21 0222  AST 19 28 34  ALT '14 21 29  '$ ALKPHOS 98 106 106  BILITOT 0.8 0.6 0.6  PROT 6.1* 6.4* 6.3*  ALBUMIN 3.3* 3.6 3.4*   Recent Labs    09/27/21 1300  LIPASE 26   No results for input(s): "AMMONIA" in the last 8760 hours. CBC: Recent Labs    10/01/21 0200 10/02/21 0253 10/03/21 0222  WBC 7.2 6.6 6.3  NEUTROABS 4.1 3.3 3.4  HGB 11.8* 12.8 12.6  HCT 35.3* 38.1 37.2  MCV 94.6 94.8 94.9  PLT 266 289 278   Lipid Panel: No results for input(s): "CHOL", "HDL", "LDLCALC", "TRIG", "CHOLHDL", "LDLDIRECT" in the last 8760 hours. No results found for: "HGBA1C"  Procedures since last visit: No results found.  Assessment/Plan  1. Primary hypertension -  BP is stable, continue Losartan, Hydralazine and Metoprolol succinate - metoprolol succinate (TOPROL-XL) 50 MG 24 hr tablet; Take 1 tablet (50 mg total) by mouth daily. Take with or immediately following a meal.  Dispense: 90 tablet; Refill: 1  2. Trigeminal neuralgia -  continue Lamictal and Gabapentin  3. Multiple sclerosis (HCC) -  stable  4. Other mixed anxiety disorders -  mood is stable, will decrease Celexa from 30 mg to 20 mg daily - citalopram (CELEXA) 20 MG tablet; Take 1 tablet (20 mg total) by mouth daily. (Patient taking differently: Take 30 mg by mouth daily.)  Dispense: 30 tablet; Refill: 3  5. Glaucoma of both eyes, unspecified glaucoma type -  denies eye pain -  continue Latanoprost eye drops    Labs/tests ordered:  None  Next appt:  Visit date not found

## 2022-01-14 NOTE — Patient Instructions (Addendum)
Please visit to your local pharmacy to receive your Tetanus vaccine, and Shingrix vaccine.   Please contact your local pharmacy, previous provider, or insurance carrier for vaccine/immunization records. Ensure that any procedures done outside of [facility] are faxed to [place] [phone] or you can sign release of records form at the front desk to keep your medical record updated.    Generalized Anxiety Disorder, Adult Generalized anxiety disorder (GAD) is a mental health condition. Unlike normal worries, anxiety related to GAD is not triggered by a specific event. These worries do not fade or get better with time. GAD interferes with relationships, work, and school. GAD symptoms can vary from mild to severe. People with severe GAD can have intense waves of anxiety with physical symptoms that are similar to panic attacks. What are the causes? The exact cause of GAD is not known, but the following are believed to have an impact: Differences in natural brain chemicals. Genes passed down from parents to children. Differences in the way threats are perceived. Development and stress during childhood. Personality. What increases the risk? The following factors may make you more likely to develop this condition: Being female. Having a family history of anxiety disorders. Being very shy. Experiencing very stressful life events, such as the death of a loved one. Having a very stressful family environment. What are the signs or symptoms? People with GAD often worry excessively about many things in their lives, such as their health and family. Symptoms may also include: Mental and emotional symptoms: Worrying excessively about natural disasters. Fear of being late. Difficulty concentrating. Fears that others are judging your performance. Physical symptoms: Fatigue. Headaches, muscle tension, muscle twitches, trembling, or feeling shaky. Feeling like your heart is  pounding or beating very fast. Feeling out of breath or like you cannot take a deep breath. Having trouble falling asleep or staying asleep, or experiencing restlessness. Sweating. Nausea, diarrhea, or irritable bowel syndrome (IBS). Behavioral symptoms: Experiencing erratic moods or irritability. Avoidance of new situations. Avoidance of people. Extreme difficulty making decisions. How is this diagnosed? This condition is diagnosed based on your symptoms and medical history. You will also have a physical exam. Your health care provider may perform tests to rule out other possible causes of your symptoms. To be diagnosed with GAD, a person must have anxiety that: Is out of his or her control. Affects several different aspects of his or her life, such as work and relationships. Causes distress that makes him or her unable to take part in normal activities. Includes at least three symptoms of GAD, such as restlessness, fatigue, trouble concentrating, irritability, muscle tension, or sleep problems. Before your health care provider can confirm a diagnosis of GAD, these symptoms must be present more days than they are not, and they must last for 6 months or longer. How is this treated? This condition may be treated with: Medicine. Antidepressant medicine is usually prescribed for long-term daily control. Anti-anxiety medicines may be added in severe cases, especially when panic attacks occur. Talk therapy (psychotherapy). Certain types of talk therapy can be helpful in treating GAD by providing support, education, and guidance. Options include: Cognitive behavioral therapy (CBT). People learn coping skills and self-calming techniques to ease their physical symptoms. They learn to identify unrealistic thoughts and behaviors and to replace them with more appropriate thoughts and behaviors. Acceptance and commitment therapy (ACT). This treatment teaches people how to be mindful as a way to cope with  unwanted thoughts and  feelings. Biofeedback. This process trains you to manage your body's response (physiological response) through breathing techniques and relaxation methods. You will work with a therapist while machines are used to monitor your physical symptoms. Stress management techniques. These include yoga, meditation, and exercise. A mental health specialist can help determine which treatment is best for you. Some people see improvement with one type of therapy. However, other people require a combination of therapies. Follow these instructions at home: Lifestyle Maintain a consistent routine and schedule. Anticipate stressful situations. Create a plan and allow extra time to work with your plan. Practice stress management or self-calming techniques that you have learned from your therapist or your health care provider. Exercise regularly and spend time outdoors. Eat a healthy diet that includes plenty of vegetables, fruits, whole grains, low-fat dairy products, and lean protein. Do not eat a lot of foods that are high in fat, added sugar, or salt (sodium). Drink plenty of water. Avoid alcohol. Alcohol can increase anxiety. Avoid caffeine and certain over-the-counter cold medicines. These may make you feel worse. Ask your pharmacist which medicines to avoid. General instructions Take over-the-counter and prescription medicines only as told by your health care provider. Understand that you are likely to have setbacks. Accept this and be kind to yourself as you persist to take better care of yourself. Anticipate stressful situations. Create a plan and allow extra time to work with your plan. Recognize and accept your accomplishments, even if you judge them as small. Spend time with people who care about you. Keep all follow-up visits. This is important. Where to find more information [place]: https://carter.com/ Substance Abuse and Mental Health Services:  ktimeonline.com Contact a health care provider if: Your symptoms do not get better. Your symptoms get worse. You have signs of depression, such as: A persistently sad or irritable mood. Loss of enjoyment in activities that used to bring you joy. Change in weight or eating. Changes in sleeping habits. Get help right away if: You have thoughts about hurting yourself or others. If you ever feel like you may hurt yourself or others, or have thoughts about taking your own life, get help right away. Go to your nearest emergency department or: Call your local emergency services (911 in the [place] Call a suicide crisis helpline, such as the [place] at 3167506441 or 988 in the [place]. This is open 24 hours a day in the [place] Text the Crisis Text Line at (386)655-0638 (in the [place].). Summary Generalized anxiety disorder (GAD) is a mental health condition that involves worry that is not triggered by a specific event. People with GAD often worry excessively about many things in their lives, such as their health and family. GAD may cause symptoms such as restlessness, trouble concentrating, sleep problems, frequent sweating, nausea, diarrhea, headaches, and trembling or muscle twitching. A mental health specialist can help determine which treatment is best for you. Some people see improvement with one type of therapy. However, other people require a combination of therapies. This information is not intended to replace advice given to you by your health care provider. Make sure you discuss any questions you have with your health care provider. Document Revised: 09/27/2020 Document Reviewed: 06/25/2020 Elsevier Patient Education  [place].

## 2022-01-15 ENCOUNTER — Inpatient Hospital Stay (HOSPITAL_COMMUNITY)
Admission: EM | Admit: 2022-01-15 | Discharge: 2022-01-21 | DRG: 312 | Disposition: A | Discharge status: Skilled Nursing Facility | Payer: Medicare Other | Attending: Internal Medicine | Admitting: Internal Medicine

## 2022-01-15 ENCOUNTER — Observation Stay (HOSPITAL_COMMUNITY): Payer: Medicare Other

## 2022-01-15 ENCOUNTER — Other Ambulatory Visit: Payer: Self-pay

## 2022-01-15 ENCOUNTER — Emergency Department (HOSPITAL_COMMUNITY): Payer: Medicare Other

## 2022-01-15 DIAGNOSIS — Z888 Allergy status to other drugs, medicaments and biological substances status: Secondary | ICD-10-CM | POA: Diagnosis not present

## 2022-01-15 DIAGNOSIS — Z803 Family history of malignant neoplasm of breast: Secondary | ICD-10-CM

## 2022-01-15 DIAGNOSIS — R4701 Aphasia: Secondary | ICD-10-CM | POA: Diagnosis present

## 2022-01-15 DIAGNOSIS — R4702 Dysphasia: Secondary | ICD-10-CM | POA: Diagnosis not present

## 2022-01-15 DIAGNOSIS — M25531 Pain in right wrist: Secondary | ICD-10-CM | POA: Diagnosis not present

## 2022-01-15 DIAGNOSIS — I639 Cerebral infarction, unspecified: Secondary | ICD-10-CM

## 2022-01-15 DIAGNOSIS — F411 Generalized anxiety disorder: Secondary | ICD-10-CM | POA: Diagnosis present

## 2022-01-15 DIAGNOSIS — S82209A Unspecified fracture of shaft of unspecified tibia, initial encounter for closed fracture: Secondary | ICD-10-CM | POA: Diagnosis not present

## 2022-01-15 DIAGNOSIS — G500 Trigeminal neuralgia: Secondary | ICD-10-CM | POA: Diagnosis present

## 2022-01-15 DIAGNOSIS — Z880 Allergy status to penicillin: Secondary | ICD-10-CM

## 2022-01-15 DIAGNOSIS — I951 Orthostatic hypotension: Principal | ICD-10-CM | POA: Diagnosis present

## 2022-01-15 DIAGNOSIS — Z79899 Other long term (current) drug therapy: Secondary | ICD-10-CM

## 2022-01-15 DIAGNOSIS — G8929 Other chronic pain: Secondary | ICD-10-CM | POA: Diagnosis present

## 2022-01-15 DIAGNOSIS — Z90711 Acquired absence of uterus with remaining cervical stump: Secondary | ICD-10-CM

## 2022-01-15 DIAGNOSIS — Z882 Allergy status to sulfonamides status: Secondary | ICD-10-CM | POA: Diagnosis not present

## 2022-01-15 DIAGNOSIS — E785 Hyperlipidemia, unspecified: Secondary | ICD-10-CM | POA: Diagnosis present

## 2022-01-15 DIAGNOSIS — E860 Dehydration: Secondary | ICD-10-CM | POA: Diagnosis not present

## 2022-01-15 DIAGNOSIS — F419 Anxiety disorder, unspecified: Secondary | ICD-10-CM | POA: Diagnosis present

## 2022-01-15 DIAGNOSIS — M542 Cervicalgia: Secondary | ICD-10-CM | POA: Diagnosis not present

## 2022-01-15 DIAGNOSIS — I6381 Other cerebral infarction due to occlusion or stenosis of small artery: Secondary | ICD-10-CM | POA: Diagnosis not present

## 2022-01-15 DIAGNOSIS — M19031 Primary osteoarthritis, right wrist: Secondary | ICD-10-CM | POA: Diagnosis not present

## 2022-01-15 DIAGNOSIS — G319 Degenerative disease of nervous system, unspecified: Secondary | ICD-10-CM | POA: Diagnosis not present

## 2022-01-15 DIAGNOSIS — M7989 Other specified soft tissue disorders: Secondary | ICD-10-CM | POA: Diagnosis not present

## 2022-01-15 DIAGNOSIS — H409 Unspecified glaucoma: Secondary | ICD-10-CM | POA: Diagnosis not present

## 2022-01-15 DIAGNOSIS — Z853 Personal history of malignant neoplasm of breast: Secondary | ICD-10-CM | POA: Diagnosis not present

## 2022-01-15 DIAGNOSIS — M47812 Spondylosis without myelopathy or radiculopathy, cervical region: Secondary | ICD-10-CM | POA: Diagnosis not present

## 2022-01-15 DIAGNOSIS — S00531A Contusion of lip, initial encounter: Secondary | ICD-10-CM | POA: Diagnosis not present

## 2022-01-15 DIAGNOSIS — K0381 Cracked tooth: Secondary | ICD-10-CM | POA: Diagnosis present

## 2022-01-15 DIAGNOSIS — Z87891 Personal history of nicotine dependence: Secondary | ICD-10-CM | POA: Diagnosis not present

## 2022-01-15 DIAGNOSIS — R404 Transient alteration of awareness: Secondary | ICD-10-CM | POA: Diagnosis not present

## 2022-01-15 DIAGNOSIS — W19XXXA Unspecified fall, initial encounter: Secondary | ICD-10-CM | POA: Diagnosis present

## 2022-01-15 DIAGNOSIS — R4781 Slurred speech: Principal | ICD-10-CM

## 2022-01-15 DIAGNOSIS — G35 Multiple sclerosis: Secondary | ICD-10-CM | POA: Diagnosis not present

## 2022-01-15 DIAGNOSIS — Z789 Other specified health status: Secondary | ICD-10-CM | POA: Diagnosis present

## 2022-01-15 DIAGNOSIS — I10 Essential (primary) hypertension: Secondary | ICD-10-CM | POA: Diagnosis not present

## 2022-01-15 DIAGNOSIS — M6281 Muscle weakness (generalized): Secondary | ICD-10-CM | POA: Diagnosis not present

## 2022-01-15 DIAGNOSIS — Z743 Need for continuous supervision: Secondary | ICD-10-CM | POA: Diagnosis not present

## 2022-01-15 DIAGNOSIS — K219 Gastro-esophageal reflux disease without esophagitis: Secondary | ICD-10-CM | POA: Diagnosis not present

## 2022-01-15 DIAGNOSIS — R261 Paralytic gait: Secondary | ICD-10-CM | POA: Diagnosis not present

## 2022-01-15 DIAGNOSIS — K08539 Fractured dental restorative material, unspecified: Secondary | ICD-10-CM

## 2022-01-15 DIAGNOSIS — I63233 Cerebral infarction due to unspecified occlusion or stenosis of bilateral carotid arteries: Secondary | ICD-10-CM | POA: Diagnosis not present

## 2022-01-15 DIAGNOSIS — R29818 Other symptoms and signs involving the nervous system: Secondary | ICD-10-CM | POA: Diagnosis not present

## 2022-01-15 DIAGNOSIS — R410 Disorientation, unspecified: Secondary | ICD-10-CM

## 2022-01-15 DIAGNOSIS — R519 Headache, unspecified: Secondary | ICD-10-CM | POA: Diagnosis not present

## 2022-01-15 DIAGNOSIS — R4182 Altered mental status, unspecified: Secondary | ICD-10-CM | POA: Diagnosis not present

## 2022-01-15 LAB — URINALYSIS, ROUTINE W REFLEX MICROSCOPIC
Bacteria, UA: NONE SEEN
Bilirubin Urine: NEGATIVE
Glucose, UA: NEGATIVE mg/dL
Hgb urine dipstick: NEGATIVE
Ketones, ur: 5 mg/dL — AB
Nitrite: NEGATIVE
Protein, ur: NEGATIVE mg/dL
Specific Gravity, Urine: 1.044 — ABNORMAL HIGH (ref 1.005–1.030)
pH: 7 (ref 5.0–8.0)

## 2022-01-15 LAB — I-STAT CHEM 8, ED
BUN: 12 mg/dL (ref 8–23)
Calcium, Ion: 1.11 mmol/L — ABNORMAL LOW (ref 1.15–1.40)
Chloride: 99 mmol/L (ref 98–111)
Creatinine, Ser: 0.8 mg/dL (ref 0.44–1.00)
Glucose, Bld: 122 mg/dL — ABNORMAL HIGH (ref 70–99)
HCT: 39 % (ref 36.0–46.0)
Hemoglobin: 13.3 g/dL (ref 12.0–15.0)
Potassium: 3.7 mmol/L (ref 3.5–5.1)
Sodium: 133 mmol/L — ABNORMAL LOW (ref 135–145)
TCO2: 25 mmol/L (ref 22–32)

## 2022-01-15 LAB — COMPREHENSIVE METABOLIC PANEL
ALT: 14 U/L (ref 0–44)
AST: 22 U/L (ref 15–41)
Albumin: 3.9 g/dL (ref 3.5–5.0)
Alkaline Phosphatase: 91 U/L (ref 38–126)
Anion gap: 11 (ref 5–15)
BUN: 12 mg/dL (ref 8–23)
CO2: 24 mmol/L (ref 22–32)
Calcium: 9.4 mg/dL (ref 8.9–10.3)
Chloride: 99 mmol/L (ref 98–111)
Creatinine, Ser: 0.92 mg/dL (ref 0.44–1.00)
GFR, Estimated: >60 mL/min (ref >60)
Glucose, Bld: 122 mg/dL — ABNORMAL HIGH (ref 70–99)
Potassium: 3.7 mmol/L (ref 3.5–5.1)
Sodium: 134 mmol/L — ABNORMAL LOW (ref 135–145)
Total Bilirubin: 0.4 mg/dL (ref 0.3–1.2)
Total Protein: 6.6 g/dL (ref 6.5–8.1)

## 2022-01-15 LAB — DIFFERENTIAL
Abs Immature Granulocytes: 0.02 10*3/uL (ref 0.00–0.07)
Basophils Absolute: 0 10*3/uL (ref 0.0–0.1)
Basophils Relative: 1 %
Eosinophils Absolute: 0.1 10*3/uL (ref 0.0–0.5)
Eosinophils Relative: 2 %
Immature Granulocytes: 0 %
Lymphocytes Relative: 26 %
Lymphs Abs: 1.3 10*3/uL (ref 0.7–4.0)
Monocytes Absolute: 0.6 10*3/uL (ref 0.1–1.0)
Monocytes Relative: 12 %
Neutro Abs: 2.9 10*3/uL (ref 1.7–7.7)
Neutrophils Relative %: 59 %

## 2022-01-15 LAB — PROTIME-INR
INR: 1 (ref 0.8–1.2)
Prothrombin Time: 13.1 seconds (ref 11.4–15.2)

## 2022-01-15 LAB — CBC
HCT: 37.4 % (ref 36.0–46.0)
Hemoglobin: 12.7 g/dL (ref 12.0–15.0)
MCH: 31.7 pg (ref 26.0–34.0)
MCHC: 34 g/dL (ref 30.0–36.0)
MCV: 93.3 fL (ref 80.0–100.0)
Platelets: 205 10*3/uL (ref 150–400)
RBC: 4.01 MIL/uL (ref 3.87–5.11)
RDW: 13.8 % (ref 11.5–15.5)
WBC: 4.8 10*3/uL (ref 4.0–10.5)
nRBC: 0 % (ref 0.0–0.2)

## 2022-01-15 LAB — ETHANOL: Alcohol, Ethyl (B): <10 mg/dL (ref <10)

## 2022-01-15 LAB — APTT: aPTT: 38 seconds — ABNORMAL HIGH (ref 24–36)

## 2022-01-15 LAB — CBG MONITORING, ED: Glucose-Capillary: 122 mg/dL — ABNORMAL HIGH (ref 70–99)

## 2022-01-15 MED ORDER — SENNOSIDES-DOCUSATE SODIUM 8.6-50 MG PO TABS: 1.0000 | ORAL_TABLET | Freq: Every evening | ORAL | Status: DC | PRN

## 2022-01-15 MED ORDER — SODIUM CHLORIDE 0.9% FLUSH
3.0000 mL | Freq: Once | INTRAVENOUS | Status: AC
  Administered 2022-01-15: 3 mL via INTRAVENOUS

## 2022-01-15 MED ORDER — GABAPENTIN 300 MG PO CAPS
300.0000 mg | ORAL_CAPSULE | Freq: Three times a day (TID) | ORAL | Status: DC
  Administered 2022-01-15 – 2022-01-16 (×2): 300 mg via ORAL
  Administered 2022-01-16: 600 mg via ORAL
  Administered 2022-01-16: 300 mg via ORAL
  Administered 2022-01-17 (×2): 600 mg via ORAL
  Administered 2022-01-17: 300 mg via ORAL
  Administered 2022-01-18 – 2022-01-19 (×4): 600 mg via ORAL
  Filled 2022-01-15 (×2): qty 2
  Filled 2022-01-15 (×2): qty 1
  Filled 2022-01-15 (×2): qty 2
  Filled 2022-01-15: qty 1
  Filled 2022-01-15: qty 2
  Filled 2022-01-15: qty 1
  Filled 2022-01-15 (×2): qty 2

## 2022-01-15 MED ORDER — ACETAMINOPHEN 325 MG PO TABS
650.0000 mg | ORAL_TABLET | ORAL | Status: DC | PRN
  Administered 2022-01-17 – 2022-01-18 (×3): 650 mg via ORAL
  Filled 2022-01-15 (×3): qty 2

## 2022-01-15 MED ORDER — ONDANSETRON HCL 4 MG/2ML IJ SOLN: 4.0000 mg | Freq: Four times a day (QID) | INTRAMUSCULAR | Status: DC | PRN

## 2022-01-15 MED ORDER — LORAZEPAM 1 MG PO TABS: 0.5000 mg | ORAL_TABLET | ORAL | Status: DC | PRN

## 2022-01-15 MED ORDER — STROKE: EARLY STAGES OF RECOVERY BOOK
Freq: Once | Status: AC
  Filled 2022-01-15 (×2): qty 1

## 2022-01-15 MED ORDER — IOHEXOL 350 MG/ML SOLN
100.0000 mL | Freq: Once | INTRAVENOUS | Status: AC | PRN
  Administered 2022-01-15: 100 mL via INTRAVENOUS

## 2022-01-15 MED ORDER — ACETAMINOPHEN 160 MG/5ML PO SOLN: 650.0000 mg | ORAL | Status: DC | PRN

## 2022-01-15 MED ORDER — ENOXAPARIN SODIUM 40 MG/0.4ML IJ SOSY
40.0000 mg | PREFILLED_SYRINGE | INTRAMUSCULAR | Status: DC
  Administered 2022-01-16 – 2022-01-21 (×6): 40 mg via SUBCUTANEOUS
  Filled 2022-01-15 (×6): qty 0.4

## 2022-01-15 MED ORDER — LORAZEPAM 2 MG/ML IJ SOLN
1.0000 mg | INTRAMUSCULAR | Status: AC | PRN
  Administered 2022-01-15: 1 mg via INTRAVENOUS
  Filled 2022-01-15: qty 1

## 2022-01-15 MED ORDER — LORAZEPAM 2 MG/ML IJ SOLN: 0.5000 mg | INTRAMUSCULAR | Status: DC | PRN

## 2022-01-15 MED ORDER — CITALOPRAM HYDROBROMIDE 10 MG PO TABS
20.0000 mg | ORAL_TABLET | Freq: Every day | ORAL | Status: DC
  Administered 2022-01-16 – 2022-01-21 (×6): 20 mg via ORAL
  Filled 2022-01-15 (×6): qty 2

## 2022-01-15 MED ORDER — ACETAMINOPHEN 650 MG RE SUPP: 650.0000 mg | RECTAL | Status: DC | PRN

## 2022-01-15 MED ORDER — LAMOTRIGINE 25 MG PO TABS
150.0000 mg | ORAL_TABLET | Freq: Three times a day (TID) | ORAL | Status: DC
  Administered 2022-01-15 – 2022-01-19 (×11): 150 mg via ORAL
  Filled 2022-01-15 (×2): qty 2
  Filled 2022-01-15: qty 6
  Filled 2022-01-15 (×4): qty 2
  Filled 2022-01-15: qty 6
  Filled 2022-01-15 (×3): qty 2

## 2022-01-15 MED ORDER — ASPIRIN 81 MG PO TBEC
81.0000 mg | DELAYED_RELEASE_TABLET | Freq: Every day | ORAL | Status: DC
  Administered 2022-01-15 – 2022-01-21 (×7): 81 mg via ORAL
  Filled 2022-01-15 (×7): qty 1

## 2022-01-15 NOTE — Assessment & Plan Note (Addendum)
Presenting with mild expressive dysphasia after a fall at home. -Neurology following -CT head negative for acute changes -CTA head/neck negative for intracranial LVO or significant stenosis -MRI brain without contrast; IV Ativan prn for anxiety/sedation -If MRI brain negative, obtain EEG per neurology recommendations -Aspirin 81 mg daily -Keep on telemetry, continue neurochecks -PT/OT/SLP eval

## 2022-01-15 NOTE — Assessment & Plan Note (Signed)
Hold antihypertensives to allow permissive hypertension for now.

## 2022-01-15 NOTE — ED Triage Notes (Signed)
Patient from home where she was LKW at 0445 this morning and now complaining of confusion.  Per EMS her roommate came home at 1600 and found her sitting on the floor.  She has a bruised lip and broken tooth. Pt has hx of heavy drinking. Pt is axox4 on arrival.

## 2022-01-15 NOTE — Hospital Course (Signed)
Crystal Huber is a 76 y.o. female with medical history significant for relapsing remitting multiple sclerosis, trigeminal neuralgia, HTN, HLD, right breast cancer, GAD, alcohol use who presented with mild expressive aphasia and admitted for further work-up.

## 2022-01-15 NOTE — ED Provider Notes (Signed)
[facility] EMERGENCY DEPARTMENT Provider Note   CSN: [id number] Arrival date & time: 01/15/22  1638  An emergency department physician performed an initial assessment on this suspected stroke patient at 1638.  History  Chief Complaint  Patient presents with   Code Stroke    Crystal Huber is a 76 y.o. female.  HPI   76 year old female with medical history significant for multiple sclerosis, breast cancer, HTN, glaucoma, trigeminal neuralgia, HLD, GERD, alcohol use who presents emergency department as a code stroke due to transient confusion and slurred speech.  Patient's last known normal was around 0445 this morning.  She was found after a fall sitting on the floor with a bruise lip and broken/chipped On the front tooth.  She had been confused after the fall.  She had some difficulty recalling words.  With EMS her CBG was 107, normotensive.  She was brought to the emergency department as a code stroke and her airway was emergently cleared at the bridge and she was taken to the [facility] for code stroke imaging with neurology bedside for evaluation.  Home Medications Prior to Admission medications   Medication Sig Start Date End Date Taking? Authorizing Provider  Biotin w/ Vitamins C & E (HAIR/SKIN/NAILS PO) Take 1 tablet by mouth daily.    [provider]  citalopram (CELEXA) 20 MG tablet Take 1 tablet (20 mg total) by mouth daily. 01/14/22   Medina-Vargas, Monina C, NP  gabapentin (NEURONTIN) 300 MG capsule Take 1 or 2 pills p.o. 3 times a day (up to 5 a day) 08/13/21   Sater, Nanine Means, MD  hydrALAZINE (APRESOLINE) 50 MG tablet TAKE ONE TABLET BY MOUTH TWICE DAILY 11/22/21   [name], NP  latanoprost (XALATAN) 0.005 % ophthalmic solution Place 1 drop into both eyes at bedtime.    [provider]  losartan (COZAAR) 50 MG tablet Take 1 tablet (50 mg total) by mouth daily. 01/07/22   [name], NP  meclizine (ANTIVERT) 25 MG tablet  Take 25 mg by mouth daily. 08/04/15   [provider]  metoprolol succinate (TOPROL-XL) 50 MG 24 hr tablet TAKE ONE TABLET BY MOUTH ONE TIME DAILY 09/03/21   [name], NP  Multiple Vitamin (MULTIVITAMIN) tablet Take 1 tablet by mouth daily.    [provider]  timolol (TIMOPTIC) 0.5 % ophthalmic solution Place 1 drop into both eyes every morning. 03/21/20   [provider]      Allergies    Carbamazepine, Imipramine, Metronidazole, Oxcarbazepine, Penicillins, Red blood cells, Statins, and Sulfa antibiotics    Review of Systems   Review of Systems  Unable to perform ROS: Acuity of condition    Physical Exam Updated Vital Signs BP (!) 146/77   Pulse 71   Temp 98.2 F (36.8 C) (Oral)   Resp 19   Ht '5\' 3"'$  (1.6 m)   Wt 56.7 kg   SpO2 95%   BMI 22.14 kg/m  Physical Exam Vitals and nursing note reviewed.  Constitutional:      General: She is not in acute distress.    Appearance: She is well-developed.  HENT:     Head: Normocephalic and atraumatic.     Mouth/Throat:     Comments: Fracture front tooth Eyes:     Conjunctiva/sclera: Conjunctivae normal.  Cardiovascular:     Rate and Rhythm: Normal rate and regular rhythm.     Heart sounds: No murmur heard. Pulmonary:     Effort: Pulmonary  effort is normal. No respiratory distress.     Breath sounds: Normal breath sounds.  Abdominal:     Palpations: Abdomen is soft.     Tenderness: There is no abdominal tenderness.  Musculoskeletal:        General: No swelling.     Cervical back: Neck supple.  Skin:    General: Skin is warm and dry.     Capillary Refill: Capillary refill takes less than 2 seconds.  Neurological:     Mental Status: She is alert.     Comments: MENTAL STATUS EXAM:    Orientation: Alert and oriented to person, place and time.  Memory: Cooperative, follows commands well.  Language: Speech is clear and language is normal.   CRANIAL NERVES:    CN 2 (Optic): Visual fields  intact to confrontation.  CN 3,4,6 (EOM): Pupils equal and reactive to light. Full extraocular eye movement without nystagmus.  CN 5 (Trigeminal): Facial sensation is normal, no weakness of masticatory muscles.  CN 7 (Facial): No facial weakness or asymmetry.  CN 8 (Auditory): Auditory acuity grossly normal.  CN 9,10 (Glossophar): The uvula is midline, the palate elevates symmetrically.  CN 11 (spinal access): Normal sternocleidomastoid and trapezius strength.  CN 12 (Hypoglossal): The tongue is midline. No atrophy or fasciculations.   MOTOR:  Muscle Strength: 5/5RUE, 5/5LUE, 5/5RLE, 5/5LLE.   COORDINATION:   Intact finger-to-nose, no tremor, no pronator drift.   SENSATION:   Intact to light touch all four extremities.  GAIT: Gait normal without ataxia   Psychiatric:        Mood and Affect: Mood normal.     ED Results / Procedures / Treatments   Labs (all labs ordered are listed, but only abnormal results are displayed) Labs Reviewed  APTT - Abnormal; Notable for the following components:      Result Value   aPTT 38 (*)    All other components within normal limits  COMPREHENSIVE METABOLIC PANEL - Abnormal; Notable for the following components:   Sodium 134 (*)    Glucose, Bld 122 (*)    All other components within normal limits  I-STAT CHEM 8, ED - Abnormal; Notable for the following components:   Sodium 133 (*)    Glucose, Bld 122 (*)    Calcium, Ion 1.11 (*)    All other components within normal limits  CBG MONITORING, ED - Abnormal; Notable for the following components:   Glucose-Capillary 122 (*)    All other components within normal limits  PROTIME-INR  CBC  DIFFERENTIAL  ETHANOL  URINALYSIS, ROUTINE W REFLEX MICROSCOPIC    EKG EKG Interpretation  Date/Time:  Tuesday January 15 2022 17:15:08 EDT Ventricular Rate:  76 PR Interval:  227 QRS Duration: 102 QT Interval:  425 QTC Calculation: 478 R Axis:   43 Text Interpretation: Sinus rhythm Prolonged PR  interval Confirmed by Regan Lemming ([id number]) on 01/15/2022 7:49:33 PM  Radiology CT ANGIO HEAD NECK W WO CM W PERF (CODE STROKE)  Result Date: 01/15/2022 CLINICAL DATA:  Acute neuro deficit.  Speech deficit aphasia. EXAM: CT ANGIOGRAPHY HEAD AND NECK CT PERFUSION HEAD TECHNIQUE: Multidetector CT imaging of the head and neck was performed using the standard protocol during bolus administration of intravenous contrast. Multiplanar CT image reconstructions and MIPs were obtained to evaluate the vascular anatomy. Carotid stenosis measurements (when applicable) are obtained utilizing NASCET criteria, using the distal internal carotid diameter as the denominator. RADIATION DOSE REDUCTION: This exam was performed according to the departmental dose-optimization program  which includes automated exposure control, adjustment of the mA and/or kV according to patient size and/or use of iterative reconstruction technique. CONTRAST:  186m OMNIPAQUE IOHEXOL 350 MG/ML SOLN COMPARISON:  CT head 01/15/2022 FINDINGS: CTA NECK FINDINGS Aortic arch: Standard branching. Imaged portion shows no evidence of aneurysm or dissection. No significant stenosis of the major arch vessel origins. Right carotid system: Mild atherosclerotic calcification right carotid bifurcation. Negative for carotid stenosis. Left carotid system: Mild atherosclerotic calcification left carotid bifurcation. Negative for stenosis. Vertebral arteries: Both vertebral arteries are patent to the skull base without significant stenosis. Skeleton: Mild degenerative change cervical spine. No acute skeletal abnormality. Other neck: Negative for mass or adenopathy in the neck. Upper chest: Lung apices clear bilaterally. Review of the MIP images confirms the above findings CTA HEAD FINDINGS Anterior circulation: Mild atherosclerotic calcification in the cavernous carotid bilaterally. Negative for stenosis. Anterior and middle cerebral arteries widely patent. No large  vessel occlusion or flow limiting stenosis. Negative for aneurysm. Posterior circulation: Both vertebral arteries patent to the basilar. Mild atherosclerotic disease distal left vertebral artery that significant stenosis. Basilar widely patent. Superior cerebellar and posterior cerebral arteries patent. No large vessel occlusion or aneurysm. Venous sinuses: Normal venous enhancement. Hypoplastic right transverse sinus. Anatomic variants: None Review of the MIP images confirms the above findings CT PERFUSION HEAD No area of cerebral infarction. Negative for delayed perfusion or ischemia. IMPRESSION: 1. Negative for intracranial large vessel occlusion or significant stenosis. 2. Mild atherosclerotic disease in the carotid bifurcation bilaterally without stenosis. Both vertebral arteries widely patent. 3. Negative CT perfusion head Electronically Signed   By: CFranchot GalloM.D.   On: 01/15/2022 17:29   CT HEAD CODE STROKE WO CONTRAST  Result Date: 01/15/2022 CLINICAL DATA:  Code stroke.  Neuro deficit, acute, stroke suspected EXAM: CT HEAD WITHOUT CONTRAST TECHNIQUE: Contiguous axial images were obtained from the base of the skull through the vertex without intravenous contrast. RADIATION DOSE REDUCTION: This exam was performed according to the departmental dose-optimization program which includes automated exposure control, adjustment of the mA and/or kV according to patient size and/or use of iterative reconstruction technique. COMPARISON:  CT head 09/27/2021. FINDINGS: Brain: No evidence of acute large vascular territory infarction, hemorrhage, hydrocephalus, extra-axial collection or mass lesion/mass effect. Similar patchy white matter hypodensities. Remote left cerebellar infarct. Vascular: No hyperdense vessel identified. Calcific atherosclerosis. Skull: No acute fracture. Sinuses/Orbits: Clear sinuses.  No acute orbital findings. Other: No mastoid effusions. ASPECTS ([facility] Early [facility] Score)  total score (0-10 with 10 being normal): 10. IMPRESSION: 1. No evidence of acute large vascular territory infarct or acute hemorrhage. ASPECTS is 10 2. Similar white matter hypodensities, which could be related to chronic microvascular ischemic disease and/or multiple sclerosis given the patient's clinical history. MRI could further characterize if clinically warranted. Electronically Signed   By: FMargaretha SheffieldM.D.   On: 01/15/2022 16:58    Procedures Procedures    Medications Ordered in ED Medications  LORazepam (ATIVAN) injection 1 mg (has no administration in time range)  lamoTRIgine (LAMICTAL) tablet 150 mg (has no administration in time range)  gabapentin (NEURONTIN) capsule 300-600 mg (has no administration in time range)  citalopram (CELEXA) tablet 20 mg (has no administration in time range)  sodium chloride flush (NS) 0.9 % injection 3 mL (3 mLs Intravenous Given 01/15/22 1734)  iohexol (OMNIPAQUE) 350 MG/ML injection 100 mL (100 mLs Intravenous Contrast Given 01/15/22 1701)    ED Course/ Medical Decision Making/ A&P  Medical Decision Making Amount and/or Complexity of Data Reviewed Labs: ordered. Radiology: ordered.  Risk Prescription drug management. Decision regarding hospitalization.    76 year old female with medical history significant for multiple sclerosis, breast cancer, HTN, glaucoma, trigeminal neuralgia, HLD, GERD, alcohol use who presents emergency department as a code stroke due to transient confusion and slurred speech.  Patient's last known normal was around 0445 this morning.  She was found after a fall sitting on the floor with a bruise lip and broken/chipped On the front tooth.  She had been confused after the fall.  She had some difficulty recalling words.  With EMS her CBG was 107, normotensive.  She was brought to the emergency department as a code stroke and her airway was emergently cleared at the bridge and she was taken to  the [facility] for code stroke imaging with neurology bedside for evaluation.  Vitally stable on arrival. Physical exam significant for a fracture front tooth.  On my evaluation, the patient was back to her baseline and neurologically intact.  Laboratory evaluation significant for CMP with mild hyponatremia to 134 otherwise unremarkable, CBC without a leukocytosis or anemia, CBG 122, urinalysis collected and pending.  CT angio head and neck and code stroke imaging was performed and resulted as follows: IMPRESSION:  1. Negative for intracranial large vessel occlusion or significant  stenosis.  2. Mild atherosclerotic disease in the carotid bifurcation  bilaterally without stenosis. Both vertebral arteries widely patent.  3. Negative CT perfusion head   Dr. Cheral Marker of neurology was contacted and recommended admission.  Neuro Recs: Admit - MRI brain  - If MRI brain is negative, will need EEG  - CIWA protocol.  - Speech evaluation to further characterize her relatively mild/subtle deficit - If EEG negative and back to baseline, most likely can be discharged home on ASA 81 mg po qd and outpatient Neurology follow up.  Dr. Posey Pronto of hospitalist medicine excepted the patient in admission.  Patient stable at time of admission.  Family members updated at bedside.   Final Clinical Impression(s) / ED Diagnoses Final diagnoses:  Slurred speech  Confusion  Fall, initial encounter  Fracture of dental restoration    Rx / DC Orders ED Discharge Orders     None         Regan Lemming, MD 01/15/22 2216

## 2022-01-15 NOTE — Consult Note (Addendum)
NEURO HOSPITALIST CONSULT NOTE   Requestig physician: Dr. Aline August  Reason for Consult: New onset of dysphasia  History obtained from:  EMS, Patient and Chart     HPI:                                                                                                                                          Crystal Huber is an 76 y.o. female with a PMHx of right breast cancer, cataract OD, glaucoma, HLD, HTN, multiple sclerosis and trigeminal neuralgia who presents as a Code Stroke to the [facility] ED via EMS afer her roommate came home at 1600 and found her sitting on the floor with a bruised lip, broken front tooth and garbled speech and apparent confusion. LKN was 0445. On arrival to the ED the patient was conversing with occasional phonemic paraphasias and difficulty recalling some less common words during interview. BP per EMS 150's/70's, CBG 107.  Past Medical History:  Diagnosis Date   Arthritis    right pinkie   Cancer ([place])    right BR  CA    Cataract    right eye   GERD (gastroesophageal reflux disease)    prn tums, mild not often   Glaucoma    Hyperlipidemia    Hypertension    Multiple sclerosis ([place])    Neuromuscular disorder ([place])    trigeminal neuralgia   Refusal of blood product    Trigeminal neuralgia of left side of face     Past Surgical History:  Procedure Laterality Date   BREAST LUMPECTOMY Right    wears prosthesis   COLONOSCOPY  12/20/2010   ganglion cyst removal Bilateral    PARTIAL HYSTERECTOMY     POLYPECTOMY      Family History  Problem Relation Age of Onset   Congestive Heart Failure Mother    Heart disease Father    Heart attack Father    Stroke Sister    Breast cancer Sister    Heart disease Sister    Heart disease Brother    Heart disease Brother    Heart disease Brother    Colon cancer Neg Hx    Colon polyps Neg Hx              Social History:  reports that she has quit smoking. Her smoking use included cigarettes. She  has never used smokeless tobacco. She reports that she does not currently use alcohol. She reports that she does not use drugs.  Allergies  Allergen Reactions   Carbamazepine     Decreased sodium level   Imipramine     Night sweats, more intense dreams   Metronidazole Nausea Only   Oxcarbazepine     Decreased sodium level   Penicillins Swelling    Yeast  infection   Red Blood Cells Other (See Comments)    Jehovah's witness, does not want blood products   Statins     Gets hyponatremia and severe muscle pain   Sulfa Antibiotics Swelling    yeast infection    HOME MEDICATIONS:                                                                                                                      No current facility-administered medications on file prior to encounter.   Current Outpatient Medications on File Prior to Encounter  Medication Sig Dispense Refill   Biotin w/ Vitamins C & E (HAIR/SKIN/NAILS PO) Take 1 tablet by mouth daily.     citalopram (CELEXA) 20 MG tablet Take 1 tablet (20 mg total) by mouth daily. 30 tablet 3   gabapentin (NEURONTIN) 300 MG capsule Take 1 or 2 pills p.o. 3 times a day (up to 5 a day) 450 capsule 3   hydrALAZINE (APRESOLINE) 50 MG tablet TAKE ONE TABLET BY MOUTH TWICE DAILY 180 tablet 1   latanoprost (XALATAN) 0.005 % ophthalmic solution Place 1 drop into both eyes at bedtime.     losartan (COZAAR) 50 MG tablet Take 1 tablet (50 mg total) by mouth daily. 30 tablet 0   meclizine (ANTIVERT) 25 MG tablet Take 25 mg by mouth daily.     metoprolol succinate (TOPROL-XL) 50 MG 24 hr tablet TAKE ONE TABLET BY MOUTH ONE TIME DAILY 90 tablet 1   Multiple Vitamin (MULTIVITAMIN) tablet Take 1 tablet by mouth daily.     timolol (TIMOPTIC) 0.5 % ophthalmic solution Place 1 drop into both eyes every morning.       ROS:                                                                                                                                       Deferred in the  context of acuity of presentation.  BP (!) 154/81   Pulse 69   Temp 98.2 F (36.8 C) (Oral)   Resp 16   Ht '5\' 3"'$  (1.6 m)   Wt 56.7 kg   SpO2 96%   BMI 22.14 kg/m  Weight 58.8 kg.   General Examination:  Physical Exam  HEENT-  Abrasions to face with broken maxillary central incisor.    Lungs- Respirations unlabored Extremities- No edema  Neurological Examination Mental Status: Alert and fully oriented. Speech is with subtle occasional phonemic paraphasias and mild naming deficit involving uncommon words, but is fluent with no errors of syntax. No dysarthria. Comprehension intact for all questions and commands. She demonstrates errors when attempting to repeat a phrase on 2 trials.  Cranial Nerves: II: Visual fields intact bilaterally in all 4 quadrants. PERRL. No extinction to DSS.   III,IV, VI: No ptosis. EOMI.  V: Temp sensation equal bilaterally VII: Smile symmetric VIII: Hearing intact to voice IX,X: No hypophonia or hoarseness XI: Head is midline XII: Midline tongue extension Motor: Right : Upper extremity   5/5    Left:     Upper extremity   5/5  Lower extremity   5/5     Lower extremity   5/5 No pronator drift.  Sensory: Temp and light touch intact throughout, bilaterally. No extinction to DSS.  Deep Tendon Reflexes: No asymmetry noted Cerebellar: No ataxia with FNF bilaterally.  Gait: Deferred  NIHSS: 1   Lab Results: Basic Metabolic Panel: No results for input(s): "NA", "K", "CL", "CO2", "GLUCOSE", "BUN", "CREATININE", "CALCIUM", "MG", "PHOS" in the last 168 hours.  CBC: No results for input(s): "WBC", "NEUTROABS", "HGB", "HCT", "MCV", "PLT" in the last 168 hours.  Cardiac Enzymes: No results for input(s): "CKTOTAL", "CKMB", "CKMBINDEX", "TROPONINI" in the last 168 hours.  Lipid Panel: No results for input(s): "CHOL", "TRIG", "HDL", "CHOLHDL", "VLDL",  "LDLCALC" in the last 168 hours.  Imaging: No results found.   Assessment: 76 year old female presenting to the ED with mild expressive aphasia after being found sitting on the floor at home with garbled speech, confusion and visible evidence of a fall involving injury to her face - Exam reveals poor insight/memory regarding her fall and confusion, as well as mild expressive dysphasia with occasional phonemic paraphasias and word finding deficit. NIHSS 1.  - CT head shows no evidence of acute large vascular territory infarct or acute hemorrhage. ASPECTS is 10. Similar white matter hypodensities relative to prior imaging study, which could be related to chronic microvascular ischemic disease and/or multiple sclerosis given the patient's clinical history.  - CTA of head and neck with HDQ:[id number] for intracranial large vessel occlusion or significant stenosis. Mild atherosclerotic disease in the carotid bifurcation bilaterally without stenosis. Both vertebral arteries widely patent. Negative CT perfusion head. Of note, CTP can yield false negative results in the case of small ischemic infarctions.  - EtOH level < 10. This does not preclude ethanol intoxication earlier today as a possible cause for her fall.  - DDx: - A small stroke is felt to be the most likely etiology for her presentation.   - DDx also includes concussion, metabolic encephalopathy or intoxication precipitating fall with ongoing confusion (has an EtOH habit per roommates interview with EMS on scene) and stroke. Seizure is also possible although she has no history of such.  - MS exacerbation is unlikely as it generally would not present this acutely and also does not usually present with confusion or dysphasia.  - Stroke risk factors: Cancer history, HLD and HTN  Recommendations: - MRI brain  - If MRI brain is negative, will need EEG - CIWA protocol.  - Speech evaluation to further characterize her relatively mild/subtle  deficit - If EEG negative and back to baseline, most likely can be discharged home on ASA 81  mg po qd and outpatient Neurology follow up.   Addendum: - MRI brain: Motion degraded exam. No acute intracranial abnormality. Scattered T2/FLAIR hyperintensity involving the supratentorial cerebral white matter, nonspecific, but presumably in large part related to history of demyelinating disease. A component of chronic microvascular ischemic disease could be contributory as well. - EEG technologist note at 2:38 AM documents completed EEG. Report pending.    Addendum: - EEG reveals continuous generalized slowing. The study is suggestive of moderate to severe diffuse encephalopathy, nonspecific to etiology. No seizures or epileptiform discharges were seen throughout the recording.     Electronically signed: Dr. Kerney Elbe 01/15/2022, 4:45 PM

## 2022-01-15 NOTE — ED Notes (Signed)
Pt refusing MRI at this time, Dr. Armandina Gemma at bedside.

## 2022-01-15 NOTE — Assessment & Plan Note (Signed)
Per documentation, patient's roommate reported to EMS about heavy alcohol use.  Patient states she has not had any alcohol since her last admission in July although provides inconsistent history.  Serum ethanol undetectable. -Continue CIWA checks

## 2022-01-15 NOTE — Assessment & Plan Note (Signed)
Continue Celexa

## 2022-01-15 NOTE — H&P (Signed)
History and Physical    Crystal Huber UXL:[id number] DOB: 1945/10/06 DOA: 01/15/2022  PCP: [name], NP  Patient coming from: Home  I have personally briefly reviewed patient's old medical records in [place]  Chief Complaint: Speech abnormality  HPI: Crystal Huber is a 76 y.o. female with medical history significant for relapsing remitting multiple sclerosis, trigeminal neuralgia, HTN, HLD, right breast cancer, GAD, alcohol use who presented to the ED for evaluation of new onset dysphasia.  Patient was last known normal around 0445 morning of 10/31.  She was found by her roommate sitting on the floor after an apparent fall with a bruised lip and knocked out crown from her front upper tooth.  She was apparently confused after the fall with difficulty recalling words.  Patient states that she was ambulating in her home with the use of a walker when she bent over and fell to the ground.  She says she hit her mouth first resulting in an abrasion to her lip and loss of her crown.  She reports chronic dizziness which she thinks was responsible for her fall.  She has not had focal weakness.  She is still having some difficulty with word finding.  She reports history of alcohol use but states that she has not had any alcohol since her last admission in July 2023 for multiple injuries related to a fall.  ED Course  Labs/Imaging on admission: I have personally reviewed following labs and imaging studies.  Initial vitals showed BP 145/71, pulse 68, RR 15, temp 98.2 F, SPO2 95% on room air.  Labs show WBC 4.8, hemoglobin 12.7, platelets 205,000, sodium 134, potassium 3.7, bicarb 24, BUN 12, creatinine 0.92, serum glucose 122, LFTs within normal limits.  Serum ethanol <10.  Urinalysis pending.  CT head without contrast negative for evidence of acute large vascular territory infarct or acute hemorrhage.  Similar white matter hypodensities noted which could be related to chronic  microvascular ischemic disease and/or multiple sclerosis.  CTA head/neck negative for intracranial LVO or significant stenosis.  CT perfusion study negative.  Neurology consulted and recommended further work-up.  The hospitalist service was consulted to admit for further evaluation and management.  Review of Systems: All systems reviewed and are negative except as documented in history of present illness above.   Past Medical History:  Diagnosis Date   Arthritis    right pinkie   Cancer ([place])    right BR  CA    Cataract    right eye   GERD (gastroesophageal reflux disease)    prn tums, mild not often   Glaucoma    Hyperlipidemia    Hypertension    Multiple sclerosis ([place])    Neuromuscular disorder ([place])    trigeminal neuralgia   Refusal of blood product    Trigeminal neuralgia of left side of face     Past Surgical History:  Procedure Laterality Date   BREAST LUMPECTOMY Right    wears prosthesis   COLONOSCOPY  12/20/2010   ganglion cyst removal Bilateral    PARTIAL HYSTERECTOMY     POLYPECTOMY      Social History:  reports that she has quit smoking. Her smoking use included cigarettes. She has never used smokeless tobacco. She reports that she does not currently use alcohol. She reports that she does not use drugs.  Allergies  Allergen Reactions   Carbamazepine     Decreased sodium level   Imipramine     Night sweats, more  intense dreams   Metronidazole Nausea Only   Oxcarbazepine     Decreased sodium level   Penicillins Swelling    Yeast infection   Red Blood Cells Other (See Comments)    Jehovah's witness, does not want blood products   Statins     Gets hyponatremia and severe muscle pain   Sulfa Antibiotics Swelling    yeast infection    Family History  Problem Relation Age of Onset   Congestive Heart Failure Mother    Heart disease Father    Heart attack Father    Stroke Sister    Breast cancer Sister    Heart disease Sister    Heart disease  Brother    Heart disease Brother    Heart disease Brother    Colon cancer Neg Hx    Colon polyps Neg Hx      Prior to Admission medications   Medication Sig Start Date End Date Taking? Authorizing Provider  Biotin w/ Vitamins C & E (HAIR/SKIN/NAILS PO) Take 1 tablet by mouth daily.    [provider]  citalopram (CELEXA) 20 MG tablet Take 1 tablet (20 mg total) by mouth daily. 01/14/22   Medina-Vargas, Monina C, NP  gabapentin (NEURONTIN) 300 MG capsule Take 1 or 2 pills p.o. 3 times a day (up to 5 a day) 08/13/21   Sater, Nanine Means, MD  hydrALAZINE (APRESOLINE) 50 MG tablet TAKE ONE TABLET BY MOUTH TWICE DAILY 11/22/21   [name], NP  latanoprost (XALATAN) 0.005 % ophthalmic solution Place 1 drop into both eyes at bedtime.    [provider]  losartan (COZAAR) 50 MG tablet Take 1 tablet (50 mg total) by mouth daily. 01/07/22   [name], NP  meclizine (ANTIVERT) 25 MG tablet Take 25 mg by mouth daily. 08/04/15   [provider]  metoprolol succinate (TOPROL-XL) 50 MG 24 hr tablet TAKE ONE TABLET BY MOUTH ONE TIME DAILY 09/03/21   [name], NP  Multiple Vitamin (MULTIVITAMIN) tablet Take 1 tablet by mouth daily.    [provider]  timolol (TIMOPTIC) 0.5 % ophthalmic solution Place 1 drop into both eyes every morning. 03/21/20   [provider]    Physical Exam: Vitals:   01/15/22 1915 01/15/22 1930 01/15/22 2015 01/15/22 2100  BP: 130/85  (!) 140/69 (!) 146/77  Pulse:  73 74 71  Resp: '18 17 19 19  '$ Temp:      TempSrc:      SpO2:  97% 96% 95%  Weight:      Height:       Constitutional: Sitting up in bed, NAD, calm, comfortable Eyes: EOMI, lids and conjunctivae normal ENMT: Abrasion at the philtrum and lower lip.  Mucous membranes are moist. Posterior pharynx clear of any exudate or lesions. Loss of crown right upper frontal tooth. Neck: normal, supple, no masses. Respiratory: clear to auscultation bilaterally, no  wheezing, no crackles. Normal respiratory effort. No accessory muscle use.  Cardiovascular: Regular rate and rhythm, no murmurs / rubs / gallops. No extremity edema. 2+ pedal pulses. Abdomen: no tenderness, no masses palpated. Musculoskeletal: no clubbing / cyanosis. No joint deformity upper and lower extremities. Good ROM, no contractures. Normal muscle tone.  Skin: Bruising lower lip and philtrum Neurologic: He exhibits some word finding difficulty.  No dysarthria.  Sensation intact. Strength 5/5 in all 4.  Psychiatric: Alert and oriented x 3.  EKG: Personally reviewed. Sinus rhythm, first-degree AV block.  Similar to prior.  Assessment/Plan Principal Problem:   Dysphasia Active Problems:   Multiple sclerosis (HCC)   Primary hypertension   Trigeminal neuralgia   Anxiety disorder   Alcohol use   Crystal Huber is a 76 y.o. female with medical history significant for relapsing remitting multiple sclerosis, trigeminal neuralgia, HTN, HLD, right breast cancer, GAD, alcohol use who presented with mild expressive aphasia and admitted for further work-up.  Assessment and Plan: * Dysphasia Presenting with mild expressive dysphasia after a fall at home. -Neurology following -CT head negative for acute changes -CTA head/neck negative for intracranial LVO or significant stenosis -MRI brain without contrast; IV Ativan prn for anxiety/sedation -If MRI brain negative, obtain EEG per neurology recommendations -Aspirin 81 mg daily -Keep on telemetry, continue neurochecks -PT/OT/SLP eval  Multiple sclerosis (HCC) Relapsing remitting MS not on active treatment.  MS exacerbation felt unlikely to cause acute presentation per neurology.  Follows with neurology, [name].  Primary hypertension Hold antihypertensives to allow permissive hypertension for now.  Trigeminal neuralgia She has been on chronic Lamictal and gabapentin for management.  For some reason Lamictal was discontinued from her med  list yesterday 10/30 with discontinue reason being completed course otherwise no documentation. -Continue chronic Lamictal 150 mg TID -Continue home gabapentin -Follow-up with neurology on 11/20 as scheduled  Alcohol use Per documentation, patient's roommate reported to EMS about heavy alcohol use.  Patient states she has not had any alcohol since her last admission in July although provides inconsistent history.  Serum ethanol undetectable. -Continue CIWA checks  Anxiety disorder Continue Celexa.  DVT prophylaxis: enoxaparin (LOVENOX) injection 40 mg Start: 01/16/22 1000 Code Status: Full code Family Communication: Discussed with patient Disposition Plan: From home, dispo pending clinical progress Consults called: Neurology Severity of Illness: The appropriate patient status for this patient is OBSERVATION. Observation status is judged to be reasonable and necessary in order to provide the required intensity of service to ensure the patient's safety. The patient's presenting symptoms, physical exam findings, and initial radiographic and laboratory data in the context of their medical condition is felt to place them at decreased risk for further clinical deterioration. Furthermore, it is anticipated that the patient will be medically stable for discharge from the hospital within 2 midnights of admission.   Zada Finders MD [facility]  If 7PM-7AM, please contact night-coverage [email or web address]  01/15/2022, 10:30 PM

## 2022-01-15 NOTE — Code Documentation (Signed)
Stroke Response Nurse Documentation Code Documentation  Crystal Huber is a 76 y.o. female arriving to [facility]  via [place] [facility] on 01-15-2022 with past medical hx of MS, anxiety,gait disorder, HTN, . On No antithrombotic. Code stroke was activated by EMS.   Patient from home where she was LKW at 0445 this morning and now complaining of confusion.  Per EMS her roommate came home at 1600 and found her sitting on the floor.  She has a bruised lip and broken tooth  Stroke team at the bedside on patient arrival. Labs drawn and patient cleared for CT by Dr. Armandina Gemma. Patient to CT with team. NIHSS 2, see documentation for details and code stroke times. Patient with Receptive aphasia  and dysarthria  on exam. The following imaging was completed:  CT Head and CTA. Patient is not a candidate for IV Thrombolytic due to being outside the TNK window. Patient is not a candidate for IR due to no LVO on CTA.   Care Plan: VS and neuro checks q 2hours.   Bedside handoff with ED RN [place].    Raliegh Ip  Stroke Response RN

## 2022-01-15 NOTE — Assessment & Plan Note (Signed)
She has been on chronic Lamictal and gabapentin for management.  For some reason Lamictal was discontinued from her med list yesterday 10/30 with discontinue reason being completed course otherwise no documentation. -Continue chronic Lamictal 150 mg TID -Continue home gabapentin -Follow-up with neurology on 11/20 as scheduled

## 2022-01-15 NOTE — Assessment & Plan Note (Signed)
Relapsing remitting MS not on active treatment.  MS exacerbation felt unlikely to cause acute presentation per neurology.  Follows with neurology, [name].

## 2022-01-16 ENCOUNTER — Observation Stay (HOSPITAL_COMMUNITY): Payer: Medicare Other

## 2022-01-16 ENCOUNTER — Encounter (HOSPITAL_COMMUNITY): Payer: Self-pay | Admitting: Internal Medicine

## 2022-01-16 DIAGNOSIS — R4182 Altered mental status, unspecified: Secondary | ICD-10-CM | POA: Diagnosis not present

## 2022-01-16 DIAGNOSIS — G35 Multiple sclerosis: Secondary | ICD-10-CM

## 2022-01-16 DIAGNOSIS — R4702 Dysphasia: Secondary | ICD-10-CM | POA: Diagnosis not present

## 2022-01-16 DIAGNOSIS — M47812 Spondylosis without myelopathy or radiculopathy, cervical region: Secondary | ICD-10-CM | POA: Diagnosis not present

## 2022-01-16 DIAGNOSIS — I951 Orthostatic hypotension: Secondary | ICD-10-CM

## 2022-01-16 DIAGNOSIS — Z789 Other specified health status: Secondary | ICD-10-CM

## 2022-01-16 DIAGNOSIS — I6381 Other cerebral infarction due to occlusion or stenosis of small artery: Secondary | ICD-10-CM | POA: Diagnosis not present

## 2022-01-16 DIAGNOSIS — I10 Essential (primary) hypertension: Secondary | ICD-10-CM

## 2022-01-16 DIAGNOSIS — M542 Cervicalgia: Secondary | ICD-10-CM | POA: Diagnosis not present

## 2022-01-16 DIAGNOSIS — R519 Headache, unspecified: Secondary | ICD-10-CM | POA: Diagnosis not present

## 2022-01-16 DIAGNOSIS — M19031 Primary osteoarthritis, right wrist: Secondary | ICD-10-CM | POA: Diagnosis not present

## 2022-01-16 DIAGNOSIS — M7989 Other specified soft tissue disorders: Secondary | ICD-10-CM | POA: Diagnosis not present

## 2022-01-16 LAB — BASIC METABOLIC PANEL
Anion gap: 9 (ref 5–15)
BUN: 9 mg/dL (ref 8–23)
CO2: 24 mmol/L (ref 22–32)
Calcium: 9.1 mg/dL (ref 8.9–10.3)
Chloride: 102 mmol/L (ref 98–111)
Creatinine, Ser: 0.86 mg/dL (ref 0.44–1.00)
GFR, Estimated: >60 mL/min (ref >60)
Glucose, Bld: 113 mg/dL — ABNORMAL HIGH (ref 70–99)
Potassium: 4 mmol/L (ref 3.5–5.1)
Sodium: 135 mmol/L (ref 135–145)

## 2022-01-16 LAB — LIPID PANEL
Cholesterol: 250 mg/dL — ABNORMAL HIGH (ref 0–200)
HDL: 104 mg/dL (ref >40)
LDL Cholesterol: 139 mg/dL — ABNORMAL HIGH (ref 0–99)
Total CHOL/HDL Ratio: 2.4 RATIO
Triglycerides: 34 mg/dL (ref <150)
VLDL: 7 mg/dL (ref 0–40)

## 2022-01-16 LAB — CBC
HCT: 37.5 % (ref 36.0–46.0)
Hemoglobin: 12.3 g/dL (ref 12.0–15.0)
MCH: 31.3 pg (ref 26.0–34.0)
MCHC: 32.8 g/dL (ref 30.0–36.0)
MCV: 95.4 fL (ref 80.0–100.0)
Platelets: 201 10*3/uL (ref 150–400)
RBC: 3.93 MIL/uL (ref 3.87–5.11)
RDW: 14 % (ref 11.5–15.5)
WBC: 7 10*3/uL (ref 4.0–10.5)
nRBC: 0 % (ref 0.0–0.2)

## 2022-01-16 LAB — HEMOGLOBIN A1C
Hgb A1c MFr Bld: 5.4 % (ref 4.8–5.6)
Mean Plasma Glucose: 108.28 mg/dL

## 2022-01-16 MED ORDER — TIMOLOL MALEATE 0.5 % OP SOLN
1.0000 [drp] | Freq: Every morning | OPHTHALMIC | Status: DC
  Administered 2022-01-17 – 2022-01-21 (×5): 1 [drp] via OPHTHALMIC
  Filled 2022-01-16: qty 5

## 2022-01-16 MED ORDER — LATANOPROST 0.005 % OP SOLN
1.0000 [drp] | Freq: Every day | OPHTHALMIC | Status: DC
  Administered 2022-01-16 – 2022-01-20 (×5): 1 [drp] via OPHTHALMIC
  Filled 2022-01-16: qty 2.5

## 2022-01-16 MED ORDER — LACTATED RINGERS IV BOLUS
250.0000 mL | Freq: Once | INTRAVENOUS | Status: AC
  Administered 2022-01-16: 250 mL via INTRAVENOUS

## 2022-01-16 MED ORDER — THIAMINE HCL 100 MG/ML IJ SOLN
500.0000 mg | INTRAVENOUS | Status: DC
  Administered 2022-01-16 – 2022-01-21 (×6): 500 mg via INTRAVENOUS
  Filled 2022-01-16 (×6): qty 5

## 2022-01-16 MED ORDER — LACTATED RINGERS IV SOLN: INTRAVENOUS | Status: DC

## 2022-01-16 NOTE — Evaluation (Signed)
Occupational Therapy Evaluation Patient Details Name: Crystal Huber MRN: [id number] DOB: September 06, 1945 Today's Date: 01/16/2022   History of Present Illness Pt is a 76 y/o female who presented with AMS s/p mechanical fall at home. Imahing negative for acute fx or infarct. EEG negative. PMH: MS, trigeminal neuralgia, HTN, HLD, breast cancer, GAD, etoh use   Clinical Impression   PTA, pt lives with a roommate, typically Modified Independent with ADLs and mobility using RW vs cane. Pt reports assistance with most IADLs. Pt presents now with deficits in cognition, strength and standing balance. Pt with speech difficulties as well, consistently using incorrect words during evaluation. Overall, pt requires Min A for bed mobility and short distance mobility with RW along with consistent DME safety cues. Pt requires Min A for UB ADL and Mod A for LB ADLs. As cognition does not appear back to baseline, pt at high risk for continued falls. Feel pt would benefit from SNF rehab at [place] but has potential to progress home with continued OOB activity. Will continue to follow acutely.     Recommendations for follow up therapy are one component of a multi-disciplinary discharge planning process, led by the attending physician.  Recommendations may be updated based on patient status, additional functional criteria and insurance authorization.   Follow Up Recommendations  Skilled nursing-short term rehab (<3 hours/day)    Assistance Recommended at Discharge Frequent or constant Supervision/Assistance  Patient can return home with the following A little help with walking and/or transfers;A lot of help with bathing/dressing/bathroom;Assistance with cooking/housework;Direct supervision/assist for medications management;Direct supervision/assist for financial management;Assist for transportation    Functional Status Assessment  Patient has had a recent decline in their functional status and demonstrates the ability to make  significant improvements in function in a reasonable and predictable amount of time.  Equipment Recommendations  None recommended by OT    Recommendations for Other Services       Precautions / Restrictions Precautions Precautions: Fall Restrictions Weight Bearing Restrictions: No      Mobility Bed Mobility Overal bed mobility: Needs Assistance Bed Mobility: Supine to Sit, Sit to Supine     Supine to sit: Min assist, HOB elevated Sit to supine: Min assist   General bed mobility comments: assist to lift trunk and get BLE back into bed    Transfers Overall transfer level: Needs assistance Equipment used: Rolling walker (2 wheels) Transfers: Sit to/from Stand Sit to Stand: Min assist           General transfer comment: steadying assist, able to take steps along bedside forwards and backwards with slight posterior bias and assist needed for RW mgmt as pt leaving it behind      Balance Overall balance assessment: Needs assistance, History of Falls Sitting-balance support: Feet supported, No upper extremity supported Sitting balance-Leahy Scale: Fair     Standing balance support: Bilateral upper extremity supported, During functional activity Standing balance-Leahy Scale: Poor                             ADL either performed or assessed with clinical judgement   ADL Overall ADL's : Needs assistance/impaired Eating/Feeding: Set up   Grooming: Min guard;Standing   Upper Body Bathing: Sitting;Supervision/ safety   Lower Body Bathing: Moderate assistance;Sit to/from stand   Upper Body Dressing : Minimal assistance;Sitting   Lower Body Dressing: Moderate assistance;Sitting/lateral leans;Sit to/from stand Lower Body Dressing Details (indicate cue type and reason): assist with brief  mgmt Toilet Transfer: Minimal assistance;Stand-pivot;Rolling walker (2 wheels);BSC/3in1   Toileting- Clothing Manipulation and Hygiene: Moderate assistance;Sitting/lateral  lean;Sit to/from stand         General ADL Comments: Noted cognitive deficits, impaired safety awareness and unsteadiness in standing     Vision Baseline Vision/History: 1 Wears glasses Ability to See in Adequate Light: 0 Adequate Patient Visual Report: No change from baseline Vision Assessment?: No apparent visual deficits     Perception     Praxis      Pertinent Vitals/Pain Pain Assessment Pain Assessment: Faces Faces Pain Scale: Hurts a little bit Pain Location: headache Pain Descriptors / Indicators: Headache Pain Intervention(s): Monitored during session     Hand Dominance Right   Extremity/Trunk Assessment Upper Extremity Assessment Upper Extremity Assessment: Generalized weakness   Lower Extremity Assessment Lower Extremity Assessment: Defer to PT evaluation   Cervical / Trunk Assessment Cervical / Trunk Assessment: Normal   Communication Communication Communication: Expressive difficulties   Cognition Arousal/Alertness: Awake/alert Behavior During Therapy: Restless Overall Cognitive Status: Impaired/Different from baseline Area of Impairment: Orientation, Attention, Memory, Following commands, Safety/judgement, Awareness, Problem solving                 Orientation Level: Disoriented to, Situation, Time Current Attention Level: Selective Memory: Decreased short-term memory Following Commands: Follows one step commands consistently Safety/Judgement: Decreased awareness of safety, Decreased awareness of deficits Awareness: Intellectual Problem Solving: Slow processing, Requires verbal cues, Requires tactile cues General Comments: pleasant, memory deficits noted as pt denies a fall in ED though was documented overnight. decreased awareness of situation, often getting words jumbled and difficulty selecting correct words for answers. decreased insight into safety with DME use requiring consistent cues     General Comments       Exercises      Shoulder Instructions      Home Living Family/patient expects to be discharged to:: Private residence Living Arrangements: Non-relatives/Friends (roommate) Available Help at Discharge: Friend(s);Family;Available PRN/intermittently Type of Home: Other(Comment) (townhouse) Home Access: Level entry     Home Layout: One level     Bathroom Shower/Tub: Occupational psychologist: Standard     Home Equipment: Grab bars - tub/shower;Grab bars - toilet;Rolling Walker (2 wheels);Cane - single point;BSC/3in1;Shower seat;Cane - quad          Prior Functioning/Environment Prior Level of Function : Independent/Modified Independent;History of Falls (last six months);Patient poor historian/Family not available             Mobility Comments: cane vs RW for mobility ADLs Comments: reports independence with ADLs. roommate handles IADLs and has assist for transportation/errands. reports managing meds        OT Problem List: Decreased strength;Decreased activity tolerance;Impaired balance (sitting and/or standing);Decreased cognition;Decreased safety awareness;Decreased knowledge of use of DME or AE      OT Treatment/Interventions: Self-care/ADL training;Therapeutic exercise;Energy conservation;DME and/or AE instruction;Therapeutic activities;Patient/family education    OT Goals(Current goals can be found in the care plan section) Acute Rehab OT Goals Patient Stated Goal: get stronger, avoid falls OT Goal Formulation: With patient Time For Goal Achievement: 01/30/22 Potential to Achieve Goals: Good  OT Frequency: Min 2X/week    Co-evaluation              AM-PAC OT "6 Clicks" Daily Activity     Outcome Measure Help from another person eating meals?: A Little Help from another person taking care of personal grooming?: A Little Help from another person toileting, which includes using toliet, bedpan, or urinal?:  A Lot Help from another person bathing (including washing,  rinsing, drying)?: A Lot Help from another person to put on and taking off regular upper body clothing?: A Little Help from another person to put on and taking off regular lower body clothing?: A Lot 6 Click Score: 15   End of Session Equipment Utilized During Treatment: Rolling walker (2 wheels);Gait belt Nurse Communication: Mobility status  Activity Tolerance: Patient tolerated treatment well Patient left: in bed;with call bell/phone within reach;with bed alarm set  OT Visit Diagnosis: Unsteadiness on feet (R26.81);Other abnormalities of gait and mobility (R26.89);Muscle weakness (generalized) (M62.81);History of falling (Z91.81);Other symptoms and signs involving cognitive function                Time: 8088-1103 OT Time Calculation (min): 23 min Charges:  OT General Charges $OT Visit: 1 Visit OT Evaluation $OT Eval Low Complexity: 1 Low  Malachy Chamber, OTR/L Acute Rehab Services Office: [phone]   Layla Maw 01/16/2022, 9:26 AM

## 2022-01-16 NOTE — Progress Notes (Signed)
EEG complete - results pending 

## 2022-01-16 NOTE — Procedures (Signed)
Patient Name: JAIDY COTTAM  MRN: [id number]  Epilepsy Attending: Lora Havens  Referring Physician/Provider: Lenore Cordia, MD Date: 01/16/2022 Duration: 23.23 mins  Patient history: 76yo F with mild expressive aphasia. EEG to evaluate for seizure.  Level of alertness: Awake  AEDs during EEG study: LTG, GBP  Technical aspects: This EEG study was done with scalp electrodes positioned according to the 10-20 International system of electrode placement. Electrical activity was reviewed with band pass filter of 1-'70Hz'$ , sensitivity of 7 uV/mm, display speed of 68m/sec with a '60Hz'$  notched filter applied as appropriate. EEG data were recorded continuously and digitally stored.  Video monitoring was available and reviewed as appropriate.  Description: EEG showed continuous generalized 3 to 5 Hz theta-delta slowing. Hyperventilation and photic stimulation were not performed.     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 No seizures or epileptiform discharges were seen throughout the recording.  Ow[place] Hara OBarbra Sarks

## 2022-01-16 NOTE — Progress Notes (Signed)
PROGRESS NOTE    LAURENCE FOLZ  OIN:[id number] DOB: [birth year] DOA: 01/15/2022 PCP: [name], NP   Chief Complaint  Patient presents with   Code Stroke    Brief Narrative:   Crystal Huber is a 76 y.o. female with medical history significant for relapsing remitting multiple sclerosis, trigeminal neuralgia, HTN, HLD, right breast cancer, GAD, alcohol use who presented to the ED for evaluation of new onset dysphasia.   Patient was last known normal around 0445 morning of 10/31.  She was found by her roommate sitting on the floor after an apparent fall with a bruised lip and knocked out crown from her front upper tooth.  She was apparently confused after the fall with difficulty recalling words.   Patient states that she was ambulating in her home with the use of a walker when she bent over and fell to the ground.  She says she hit her mouth first resulting in an abrasion to her lip and loss of her crown.  She reports chronic dizziness which she thinks was responsible for her fall.  She has not had focal weakness.  She is still having some difficulty with word finding.   She reports history of alcohol use but states that she has not had any alcohol since her last admission in July 2023 for multiple injuries related to a fall.   ED Course  Labs/Imaging on admission: I have personally reviewed following labs and imaging studies.   Initial vitals showed BP 145/71, pulse 68, RR 15, temp 98.2 F, SPO2 95% on room air.   Labs show WBC 4.8, hemoglobin 12.7, platelets 205,000, sodium 134, potassium 3.7, bicarb 24, BUN 12, creatinine 0.92, serum glucose 122, LFTs within normal limits.  Serum ethanol <10.  Urinalysis pending.   CT head without contrast negative for evidence of acute large vascular territory infarct or acute hemorrhage.  Similar white matter hypodensities noted which could be related to chronic microvascular ischemic disease and/or multiple sclerosis.   CTA head/neck negative  for intracranial LVO or significant stenosis.  CT perfusion study negative.   Neurology consulted and recommended further work-up.  The hospitalist service was consulted to admit for further evaluation and management.  Assessment & Plan:   Principal Problem:   Dysphasia Active Problems:   Multiple sclerosis ([place])   Primary hypertension   Trigeminal neuralgia   Anxiety disorder   Alcohol use   Dysphasia - Presenting with mild expressive dysphasia after a fall at home. -CT head negative for acute changes -CTA head/neck negative for intracranial LVO or significant stenosis - EEG diffuse encephalopathy, no evidence of epileptiform activity -MRI brain with no acute intracranial abnormality, but significant for scattered L2/FLAIR involving the supratentorial cerebral white matter, due to underlying demyelinating disease, will await further recommendation from neurology. -PT/OT/SLP consulted.    Multiple sclerosis ([place]) Relapsing remitting MS not on active treatment.  she Follows with neurology, [name]. -Await further recommendation from neurology regarding MRI finding.   Primary hypertension Orthostasis -Continue to hold antihypertensive medication given she is orthostatic-was orthostatic with PT today, sitting up 112/55, standing was 65/45, continue to hold meds, will give fluid bolus and IV fluids   Trigeminal neuralgia She has been on chronic Lamictal and gabapentin for management.  For some reason Lamictal was discontinued from her med list yesterday 10/30 with discontinue reason being completed course otherwise no documentation. -Continue chronic Lamictal 150 mg TID -Continue home gabapentin -Follow-up with neurology on 11/20 as scheduled   Alcohol  use Per documentation, patient's roommate reported to EMS about heavy alcohol use.  Patient states she has not had any alcohol since her last admission in July although provides inconsistent history.  Serum ethanol  undetectable. -Continue CIWA checks -Start on thiamine   Anxiety disorder Continue Celexa.   Will check right wrist x-ray  DVT prophylaxis: lovenox Code Status: (Full) Family Communication: none at bedside Disposition:   Status is: Observation    Consultants:  neurology  Subjective:  Patient is mildly confused, complains of right wrist pain, she was orthostatic during evaluation by PT today Objective: Vitals:   2022-02-04 0500 2022-02-04 0514 02/04/2022 1005 02-04-22 1021  BP: (!) 100/51  (!) 132/57   Pulse: (!) 54  (!) 58   Resp: 15  19   Temp:  (!) 97.5 F (36.4 C)  98 F (36.7 C)  TempSrc:  Oral  Oral  SpO2: 94%  93%   Weight:      Height:       No intake or output data in the 24 hours ending 04-Feb-2022 1253 Filed Weights   01/15/22 1600 01/15/22 1744  Weight: 58.8 kg 56.7 kg    Examination:  Awake Alert, frail, deconditioned female laying in bed in no apparent distress, dry cracked lips, delayed skin turgor Symmetrical Chest wall movement, Good air movement bilaterally, CTAB RRR,No Gallops,Rubs or new Murmurs, No Parasternal Heave +ve B.Sounds, Abd Soft, No tenderness, No rebound - guarding or rigidity. No Cyanosis, Clubbing or edema, No new Rash or bruise      Data Reviewed: I have personally reviewed following labs and imaging studies  CBC: Recent Labs  Lab 01/15/22 1640 01/15/22 1644 2022-02-04 0514  WBC 4.8  --  7.0  NEUTROABS 2.9  --   --   HGB 12.7 13.3 12.3  HCT 37.4 39.0 37.5  MCV 93.3  --  95.4  PLT 205  --  213    Basic Metabolic Panel: Recent Labs  Lab 01/15/22 1640 01/15/22 1644 02/04/2022 0514  NA 134* 133* 135  K 3.7 3.7 4.0  CL 99 99 102  CO2 24  --  24  GLUCOSE 122* 122* 113*  BUN '12 12 9  '$ CREATININE 0.92 0.80 0.86  CALCIUM 9.4  --  9.1    GFR: Estimated Creatinine Clearance: 46 mL/min (by C-G formula based on SCr of 0.86 mg/dL).  Liver Function Tests: Recent Labs  Lab 01/15/22 1640  AST 22  ALT 14  ALKPHOS 91   BILITOT 0.4  PROT 6.6  ALBUMIN 3.9    CBG: Recent Labs  Lab 01/15/22 1640  GLUCAP 122*     No results found for this or any previous visit (from the past 240 hour(s)).       Radiology Studies: EEG adult  Result Date: February 04, 2022 Lora Havens, MD     02-04-2022  8:59 AM Patient Name: YANELY MAST MRN: [id number] Epilepsy Attending: Lora Havens Referring Physician/Provider: Lenore Cordia, MD Date: 2022/02/04 Duration: 23.23 mins Patient history: 76yo F with mild expressive aphasia. EEG to evaluate for seizure. Level of alertness: Awake AEDs during EEG study: LTG, GBP Technical aspects: This EEG study was done with scalp electrodes positioned according to the 10-20 International system of electrode placement. Electrical activity was reviewed with band pass filter of 1-'70Hz'$ , sensitivity of 7 uV/mm, display speed of 84m/sec with a '60Hz'$  notched filter applied as appropriate. EEG data were recorded continuously and digitally stored.  Video monitoring was available and reviewed as  appropriate. Description: EEG showed continuous generalized 3 to 5 Hz theta-delta slowing. Hyperventilation and photic stimulation were not performed.   ABNORMALITY - Continuous slow, generalized IMPRESSION: This study is suggestive of moderate to severe diffuse encephalopathy, nonspecific etiology. No seizures or epileptiform discharges were seen throughout the recording. Priyanka Barbra Sarks   CT HEAD WO CONTRAST (5MM)  Result Date: 01/16/2022 CLINICAL DATA:  [place] and hit head. Now with head and neck pain. There is a known history of multiple sclerosis. EXAM: CT HEAD WITHOUT CONTRAST CT CERVICAL SPINE WITHOUT CONTRAST TECHNIQUE: Multidetector CT imaging of the head and cervical spine was performed following the standard protocol without intravenous contrast. Multiplanar CT image reconstructions of the cervical spine were also generated. RADIATION DOSE REDUCTION: This exam was performed according to the  departmental dose-optimization program which includes automated exposure control, adjustment of the mA and/or kV according to patient size and/or use of iterative reconstruction technique. COMPARISON:  Head CT 01/15/2022, head CT 09/27/2021, cervical spine CT 09/27/2021. FINDINGS: CT HEAD FINDINGS Brain: There is mild global atrophy with mild atrophic ventriculomegaly. Moderate patchy hypoattenuating cerebral white matter disease is again noted, disproportionately prominent present again in the right frontal lobe which may relate to prior history of MS. There is a small chronic lacunar infarct laterally in the left cerebellar hemisphere. No new asymmetry is seen concerning for an acute cortical based infarct, hemorrhage, mass or midline shift. Basal cisterns are clear. Vascular: Scattered calcific plaque both carotid siphons, distal left vertebral artery. No hyperdense central vessel. Skull: Negative for fractures or focal lesions. Sinuses/Orbits: No acute finding. Other: None. CT CERVICAL SPINE FINDINGS Alignment: Minimal chronic degenerative anterolisthesis is again noted at C4-5 and C7-T1 No traumatic or further listhesis is seen. Narrowing and spurring of the anterior atlantodental joint is again noted. Slight cervical levoscoliosis. Skull base and vertebrae: There is osteopenia without evidence of fractures or focal bone lesion. A nuchal ligament calcific body is again noted posteriorly at C4-5. Soft tissues and spinal canal: No prevertebral fluid or swelling. No visible canal hematoma. There are mild calcifications in the proximal cervical ICAs. Disc levels: Variable disc degeneration noted at C4-5 through C7-T1, greatest C5-6 and C6-7. The C2-3 and C3-4 discs are normal in height. There is mild cervical spondylosis. No herniated discs or cord compromise are seen. There are facet joint and uncinate spurs at most levels, with moderate foraminal stenosis noted on the right at C5-6 and C6-7. Other foramina appear  clear. Upper chest: Negative. Other: None. IMPRESSION: 1. No acute intracranial CT findings or depressed skull fractures. 2. Stable atrophy and white matter disease. 3. Osteopenia and degenerative change without evidence of cervical fractures or traumatic listhesis. 4. Carotid arthrosclerosis. Electronically Signed   By: Telford Nab M.D.   On: 01/16/2022 05:09   CT Cervical Spine Wo Contrast  Result Date: 01/16/2022 CLINICAL DATA:  [place] and hit head. Now with head and neck pain. There is a known history of multiple sclerosis. EXAM: CT HEAD WITHOUT CONTRAST CT CERVICAL SPINE WITHOUT CONTRAST TECHNIQUE: Multidetector CT imaging of the head and cervical spine was performed following the standard protocol without intravenous contrast. Multiplanar CT image reconstructions of the cervical spine were also generated. RADIATION DOSE REDUCTION: This exam was performed according to the departmental dose-optimization program which includes automated exposure control, adjustment of the mA and/or kV according to patient size and/or use of iterative reconstruction technique. COMPARISON:  Head CT 01/15/2022, head CT 09/27/2021, cervical spine CT 09/27/2021. FINDINGS: CT HEAD FINDINGS Brain: There  is mild global atrophy with mild atrophic ventriculomegaly. Moderate patchy hypoattenuating cerebral white matter disease is again noted, disproportionately prominent present again in the right frontal lobe which may relate to prior history of MS. There is a small chronic lacunar infarct laterally in the left cerebellar hemisphere. No new asymmetry is seen concerning for an acute cortical based infarct, hemorrhage, mass or midline shift. Basal cisterns are clear. Vascular: Scattered calcific plaque both carotid siphons, distal left vertebral artery. No hyperdense central vessel. Skull: Negative for fractures or focal lesions. Sinuses/Orbits: No acute finding. Other: None. CT CERVICAL SPINE FINDINGS Alignment: Minimal chronic  degenerative anterolisthesis is again noted at C4-5 and C7-T1 No traumatic or further listhesis is seen. Narrowing and spurring of the anterior atlantodental joint is again noted. Slight cervical levoscoliosis. Skull base and vertebrae: There is osteopenia without evidence of fractures or focal bone lesion. A nuchal ligament calcific body is again noted posteriorly at C4-5. Soft tissues and spinal canal: No prevertebral fluid or swelling. No visible canal hematoma. There are mild calcifications in the proximal cervical ICAs. Disc levels: Variable disc degeneration noted at C4-5 through C7-T1, greatest C5-6 and C6-7. The C2-3 and C3-4 discs are normal in height. There is mild cervical spondylosis. No herniated discs or cord compromise are seen. There are facet joint and uncinate spurs at most levels, with moderate foraminal stenosis noted on the right at C5-6 and C6-7. Other foramina appear clear. Upper chest: Negative. Other: None. IMPRESSION: 1. No acute intracranial CT findings or depressed skull fractures. 2. Stable atrophy and white matter disease. 3. Osteopenia and degenerative change without evidence of cervical fractures or traumatic listhesis. 4. Carotid arthrosclerosis. Electronically Signed   By: Telford Nab M.D.   On: 01/16/2022 05:09   MR BRAIN WO CONTRAST  Result Date: 01/16/2022 CLINICAL DATA:  Initial evaluation for mental status change, unknown cause. EXAM: MRI HEAD WITHOUT CONTRAST TECHNIQUE: Multiplanar, multiecho pulse sequences of the brain and surrounding structures were obtained without intravenous contrast. COMPARISON:  Prior CTs from earlier the same day. FINDINGS: Brain: Examination moderately to severely degraded by motion artifact. Generalized age-related cerebral atrophy. Scattered patchy and confluent T2/FLAIR hyperintensity seen involving the periventricular and deep white matter both cerebral hemispheres, nonspecific, but presumably in large part related to history of  demyelinating disease. A component of chronic microvascular ischemic disease may be contributory as well. No evidence for acute or subacute ischemia. Gray-white matter differentiation maintained. No visible acute or chronic intracranial blood products. No mass lesion, midline shift or mass effect. No hydrocephalus or extra-axial fluid collection. No visible signal changes to suggest active demyelination. Vascular: Major intracranial vascular flow voids are maintained. Skull and upper cervical spine: Craniocervical junction within normal limits. Bone marrow scan signal intensity grossly normal. No scalp soft tissue abnormality. Sinuses/Orbits: Globes orbital soft tissues within normal limits. Paranasal sinuses are largely clear. No significant mastoid effusion. Other: None. IMPRESSION: 1. Motion degraded exam. 2. No acute intracranial abnormality. 3. Scattered T2/FLAIR hyperintensity involving the supratentorial cerebral white matter, nonspecific, but presumably in large part related to history of demyelinating disease. A component of chronic microvascular ischemic disease could be contributory as well. Electronically Signed   By: Jeannine Boga M.D.   On: 01/16/2022 00:39   CT ANGIO HEAD NECK W WO CM W PERF (CODE STROKE)  Result Date: 01/15/2022 CLINICAL DATA:  Acute neuro deficit.  Speech deficit aphasia. EXAM: CT ANGIOGRAPHY HEAD AND NECK CT PERFUSION HEAD TECHNIQUE: Multidetector CT imaging of the head and neck was  performed using the standard protocol during bolus administration of intravenous contrast. Multiplanar CT image reconstructions and MIPs were obtained to evaluate the vascular anatomy. Carotid stenosis measurements (when applicable) are obtained utilizing NASCET criteria, using the distal internal carotid diameter as the denominator. RADIATION DOSE REDUCTION: This exam was performed according to the departmental dose-optimization program which includes automated exposure control, adjustment  of the mA and/or kV according to patient size and/or use of iterative reconstruction technique. CONTRAST:  125m OMNIPAQUE IOHEXOL 350 MG/ML SOLN COMPARISON:  CT head 01/15/2022 FINDINGS: CTA NECK FINDINGS Aortic arch: Standard branching. Imaged portion shows no evidence of aneurysm or dissection. No significant stenosis of the major arch vessel origins. Right carotid system: Mild atherosclerotic calcification right carotid bifurcation. Negative for carotid stenosis. Left carotid system: Mild atherosclerotic calcification left carotid bifurcation. Negative for stenosis. Vertebral arteries: Both vertebral arteries are patent to the skull base without significant stenosis. Skeleton: Mild degenerative change cervical spine. No acute skeletal abnormality. Other neck: Negative for mass or adenopathy in the neck. Upper chest: Lung apices clear bilaterally. Review of the MIP images confirms the above findings CTA HEAD FINDINGS Anterior circulation: Mild atherosclerotic calcification in the cavernous carotid bilaterally. Negative for stenosis. Anterior and middle cerebral arteries widely patent. No large vessel occlusion or flow limiting stenosis. Negative for aneurysm. Posterior circulation: Both vertebral arteries patent to the basilar. Mild atherosclerotic disease distal left vertebral artery that significant stenosis. Basilar widely patent. Superior cerebellar and posterior cerebral arteries patent. No large vessel occlusion or aneurysm. Venous sinuses: Normal venous enhancement. Hypoplastic right transverse sinus. Anatomic variants: None Review of the MIP images confirms the above findings CT PERFUSION HEAD No area of cerebral infarction. Negative for delayed perfusion or ischemia. IMPRESSION: 1. Negative for intracranial large vessel occlusion or significant stenosis. 2. Mild atherosclerotic disease in the carotid bifurcation bilaterally without stenosis. Both vertebral arteries widely patent. 3. Negative CT perfusion  head Electronically Signed   By: CFranchot GalloM.D.   On: 01/15/2022 17:29   CT HEAD CODE STROKE WO CONTRAST  Result Date: 01/15/2022 CLINICAL DATA:  Code stroke.  Neuro deficit, acute, stroke suspected EXAM: CT HEAD WITHOUT CONTRAST TECHNIQUE: Contiguous axial images were obtained from the base of the skull through the vertex without intravenous contrast. RADIATION DOSE REDUCTION: This exam was performed according to the departmental dose-optimization program which includes automated exposure control, adjustment of the mA and/or kV according to patient size and/or use of iterative reconstruction technique. COMPARISON:  CT head 09/27/2021. FINDINGS: Brain: No evidence of acute large vascular territory infarction, hemorrhage, hydrocephalus, extra-axial collection or mass lesion/mass effect. Similar patchy white matter hypodensities. Remote left cerebellar infarct. Vascular: No hyperdense vessel identified. Calcific atherosclerosis. Skull: No acute fracture. Sinuses/Orbits: Clear sinuses.  No acute orbital findings. Other: No mastoid effusions. ASPECTS ([facility] Early CT Score) total score (0-10 with 10 being normal): 10. IMPRESSION: 1. No evidence of acute large vascular territory infarct or acute hemorrhage. ASPECTS is 10 2. Similar white matter hypodensities, which could be related to chronic microvascular ischemic disease and/or multiple sclerosis given the patient's clinical history. MRI could further characterize if clinically warranted. Electronically Signed   By: FMargaretha SheffieldM.D.   On: 01/15/2022 16:58        Scheduled Meds:   stroke: early stages of recovery book   Does not apply Once   aspirin EC  81 mg Oral Daily   citalopram  20 mg Oral Daily   enoxaparin (LOVENOX) injection  40 mg Subcutaneous  Q24H   gabapentin  300-600 mg Oral TID   lamoTRIgine  150 mg Oral TID   latanoprost  1 drop Both Eyes QHS   timolol  1 drop Both Eyes q morning   Continuous Infusions:   lactated ringers     thiamine (VITAMIN B1) injection Stopped (01/16/22 1150)     LOS: 0 days       Phillips Climes, MD [facility]   To contact the attending provider between 7A-7P or the covering provider during after hours 7P-7A, please log into the web site [email or web address] and access using universal [place] password for that web site. If you do not have the password, please call the hospital operator.  01/16/2022, 12:53 PM

## 2022-01-16 NOTE — ED Notes (Signed)
Pt found on floor by ED tech, states she fell and is unsure if she hit her head, appears confused, but answering orientation questions appropriately at this time. Admitting MD paged, awaiting response and further orders.

## 2022-01-16 NOTE — Evaluation (Signed)
Physical Therapy Evaluation Patient Details Name: Crystal Huber MRN: [id number] DOB: Dec 30, 1945 Today's Date: 01/16/2022  History of Present Illness  Pt is a 76 y/o female who presented with AMS s/p mechanical fall at home. Imahing negative for acute fx or infarct. EEG negative. PMH: MS, trigeminal neuralgia, HTN, HLD, breast cancer, GAD, etoh use  Clinical Impression  Pt was seen for mobility in ED with help to stand and ck of BP due to recent symptoms with falling and light headed feelings.  Pt was down on BP:  sitting 112/51, standing 65/45, 102/51 upon return to supine.  Pt is not symptomatic but family with her report she has been at home.  Follow her to encourage mobility as her symptoms permit, and will await the results of her imaging on R wrist with pain and bruising.  Per her family member (POA) at the ED, the wrist has changed since earlier.  MD aware and will follow up to give PT parameters for mobility.  Asking for SNF to see after hosp due to her BP drops, low mobility tolerances and lack of assistance at home.  Will require close supervision for all movement for now, focusing on BP checks with PT.         Recommendations for follow up therapy are one component of a multi-disciplinary discharge planning process, led by the attending physician.  Recommendations may be updated based on patient status, additional functional criteria and insurance authorization.  Follow Up Recommendations Skilled nursing-short term rehab (<3 hours/day) Can patient physically be transported by private vehicle: No    Assistance Recommended at Discharge Frequent or constant Supervision/Assistance  Patient can return home with the following  A lot of help with walking and/or transfers;A little help with bathing/dressing/bathroom;Assistance with cooking/housework;Direct supervision/assist for medications management;Direct supervision/assist for financial management;Assist for transportation;Help with stairs or  ramp for entrance    Equipment Recommendations None recommended by PT  Recommendations for Other Services       Functional Status Assessment Patient has had a recent decline in their functional status and demonstrates the ability to make significant improvements in function in a reasonable and predictable amount of time.     Precautions / Restrictions Precautions Precautions: Fall Precaution Comments: Watch BP Restrictions Weight Bearing Restrictions: No      Mobility  Bed Mobility Overal bed mobility: Needs Assistance Bed Mobility: Supine to Sit, Sit to Supine     Supine to sit: Min assist Sit to supine: Min assist   General bed mobility comments: sequencing and safety with bed linens and lines    Transfers Overall transfer level: Needs assistance Equipment used: Rolling walker (2 wheels) Transfers: Sit to/from Stand Sit to Stand: Min assist           General transfer comment: LUE HHA to avoid R wrist pain    Ambulation/Gait               General Gait Details: delayed gait due to her standing BP  Stairs            Wheelchair Mobility    Modified Rankin (Stroke Patients Only)       Balance Overall balance assessment: History of Falls, Needs assistance Sitting-balance support: Feet supported Sitting balance-Leahy Scale: Fair     Standing balance support: Bilateral upper extremity supported, During functional activity Standing balance-Leahy Scale: Poor  Pertinent Vitals/Pain Pain Assessment Pain Assessment: Faces Faces Pain Scale: Hurts little more Pain Location: R wrist    Home Living Family/patient expects to be discharged to:: Private residence Living Arrangements: Non-relatives/Friends Available Help at Discharge: Friend(s);Family;Available PRN/intermittently Type of Home: Other(Comment) Home Access: Level entry       Home Layout: One level Home Equipment: Grab bars -  tub/shower;Grab bars - toilet;Rolling Walker (2 wheels);Cane - single point;BSC/3in1;Shower seat;Cane - quad Additional Comments: has a family member to give history    Prior Function Prior Level of Function : Independent/Modified Independent             Mobility Comments: cane vs RW for mobility       Hand Dominance   Dominant Hand: Right    Extremity/Trunk Assessment   Upper Extremity Assessment Upper Extremity Assessment: Defer to OT evaluation    Lower Extremity Assessment Lower Extremity Assessment: Generalized weakness    Cervical / Trunk Assessment Cervical / Trunk Assessment: Normal  Communication   Communication: Expressive difficulties  Cognition Arousal/Alertness: Awake/alert Behavior During Therapy: Impulsive Overall Cognitive Status: Impaired/Different from baseline Area of Impairment: Orientation, Attention, Memory, Following commands, Safety/judgement, Awareness, Problem solving                 Orientation Level: Place, Time, Situation Current Attention Level: Selective Memory: Decreased short-term memory Following Commands: Follows one step commands with increased time Safety/Judgement: Decreased awareness of safety, Decreased awareness of deficits Awareness: Intellectual Problem Solving: Slow processing, Requires verbal cues, Requires tactile cues General Comments: pt is very adamant she wants to go home and can make a few calls to arrange help.  Family wiht her says no        General Comments General comments (skin integrity, edema, etc.): pt is not symptomatic with standing today despite BP but her niece reports she is light headed before falls at home    Exercises     Assessment/Plan    PT Assessment Patient needs continued PT services  PT Problem List Cardiopulmonary status limiting activity;Decreased balance;Decreased strength;Decreased cognition;Decreased knowledge of use of DME       PT Treatment Interventions DME  instruction;Gait training;Functional mobility training;Therapeutic activities;Therapeutic exercise;Balance training;Neuromuscular re-education;Patient/family education    PT Goals (Current goals can be found in the Care Plan section)  Acute Rehab PT Goals Patient Stated Goal: to get home ASAP PT Goal Formulation: With patient/family Time For Goal Achievement: 01/30/22 Potential to Achieve Goals: Good    Frequency Min 2X/week     Co-evaluation               AM-PAC PT "6 Clicks" Mobility  Outcome Measure Help needed turning from your back to your side while in a flat bed without using bedrails?: A Little Help needed moving from lying on your back to sitting on the side of a flat bed without using bedrails?: A Little Help needed moving to and from a bed to a chair (including a wheelchair)?: A Little Help needed standing up from a chair using your arms (e.g., wheelchair or bedside chair)?: A Little Help needed to walk in hospital room?: Total Help needed climbing 3-5 steps with a railing? : Total 6 Click Score: 14    End of Session Equipment Utilized During Treatment: Gait belt Activity Tolerance: Patient limited by fatigue;Treatment limited secondary to medical complications (Comment) Patient left: in bed;with call bell/phone within reach;with family/visitor present Nurse Communication: Mobility status PT Visit Diagnosis: Unsteadiness on feet (R26.81);Muscle weakness (generalized) (M62.81);Other abnormalities of gait  and mobility (R26.89)    Time: 2481-8590 PT Time Calculation (min) (ACUTE ONLY): 28 min   Charges:   PT Evaluation $PT Eval Moderate Complexity: 1 Mod PT Treatments $Therapeutic Activity: 8-22 mins       Ramond Dial 01/16/2022, 1:14 PM  Mee Hives, PT PhD Acute Rehab Dept. Number: [place] [facility] [place]

## 2022-01-16 NOTE — Progress Notes (Signed)
Overnight progress note  Notified by RN that patient had an unwitnessed fall.  She was found on the floor by the nurse tech and was conscious.  Per RN, patient has chronic pain from trigeminal neuralgia but has not endorsed any new pain since after the fall and resting comfortably.  No bruising or deformities noted.  No change in neurologic assessment.  Vital signs stable.  Informed by RN that patient was evaluated by ED provider after the fall and CT head and C-spine were ordered.  -Stat heat CT head and C-spine pending -Fall precautions

## 2022-01-16 NOTE — ED Notes (Signed)
Pt placed on hospital bed

## 2022-01-17 DIAGNOSIS — R4702 Dysphasia: Secondary | ICD-10-CM | POA: Diagnosis not present

## 2022-01-17 DIAGNOSIS — G35 Multiple sclerosis: Secondary | ICD-10-CM | POA: Diagnosis not present

## 2022-01-17 DIAGNOSIS — I951 Orthostatic hypotension: Secondary | ICD-10-CM | POA: Diagnosis not present

## 2022-01-17 LAB — BASIC METABOLIC PANEL
Anion gap: 8 (ref 5–15)
BUN: 10 mg/dL (ref 8–23)
CO2: 26 mmol/L (ref 22–32)
Calcium: 8.8 mg/dL — ABNORMAL LOW (ref 8.9–10.3)
Chloride: 100 mmol/L (ref 98–111)
Creatinine, Ser: 0.75 mg/dL (ref 0.44–1.00)
GFR, Estimated: >60 mL/min (ref >60)
Glucose, Bld: 88 mg/dL (ref 70–99)
Potassium: 3.5 mmol/L (ref 3.5–5.1)
Sodium: 134 mmol/L — ABNORMAL LOW (ref 135–145)

## 2022-01-17 LAB — CBC
HCT: 34.8 % — ABNORMAL LOW (ref 36.0–46.0)
Hemoglobin: 11.9 g/dL — ABNORMAL LOW (ref 12.0–15.0)
MCH: 31.6 pg (ref 26.0–34.0)
MCHC: 34.2 g/dL (ref 30.0–36.0)
MCV: 92.6 fL (ref 80.0–100.0)
Platelets: 191 10*3/uL (ref 150–400)
RBC: 3.76 MIL/uL — ABNORMAL LOW (ref 3.87–5.11)
RDW: 14.1 % (ref 11.5–15.5)
WBC: 5 10*3/uL (ref 4.0–10.5)
nRBC: 0 % (ref 0.0–0.2)

## 2022-01-17 LAB — PHOSPHORUS: Phosphorus: 3.1 mg/dL (ref 2.5–4.6)

## 2022-01-17 MED ORDER — LIP MEDEX EX OINT: TOPICAL_OINTMENT | CUTANEOUS | Status: DC | PRN

## 2022-01-17 MED ORDER — MECLIZINE HCL 25 MG PO TABS
25.0000 mg | ORAL_TABLET | Freq: Every day | ORAL | Status: DC
  Administered 2022-01-17 – 2022-01-21 (×5): 25 mg via ORAL
  Filled 2022-01-17 (×5): qty 1

## 2022-01-17 NOTE — Plan of Care (Signed)
  Problem: Ischemic Stroke/TIA Tissue Perfusion: Goal: Complications of ischemic stroke/TIA will be minimized Outcome: Progressing   Problem: Coping: Goal: Will verbalize positive feelings about self Outcome: Progressing   Problem: Activity: Goal: Risk for activity intolerance will decrease Outcome: Progressing   Problem: Nutrition: Goal: Adequate nutrition will be maintained Outcome: Progressing   Problem: Coping: Goal: Level of anxiety will decrease Outcome: Progressing

## 2022-01-17 NOTE — TOC Initial Note (Signed)
Transition of Care [facility]) - Initial/Assessment Note    Patient Details  Name: Crystal Huber MRN: [id number] Date of Birth: [birth year]  [facility] CM/SW Contact:    Coralee Pesa, [place] Phone Number: 01/17/2022, 10:33 AM  Clinical Narrative:                 CSW met with pt at bedside to discuss SNF recommendation. Pt states that she does not want to go to a facility. She states she has been before and had a good experience, but feels she would be fine at home. Pt states she lives with a roommate who is a live in CNA. She works out of the home several nights a week, but is usually there. Pt states she has a rotation of friends that will be available to check on her. Pt notes she has a walker, several wheelchairs, a 3n1, an accessible shower chair with seat, and canes. Pt states she had HH PT several weeks ago, but she can't afford the out of pocket costs to have them continue. Pt states she has transportation at discharge and has no problems getting to dr. Rebeca Alert. Pt states no issues getting food or resources in the home, and feels safe at home. Plan to have PT/ mobility see her again. Permission given to speak with Niece Tammy who is POA. Tammy states she would like pt to go to SNF, but understands it is her choice to go home. Tammy notes concern that pt does not have the supports she needs. CSW notified pt had gone to rehab "off of [place]", presumably [facility]. TOC will continue to follow to assist with discharge needs. Expected Discharge Plan: Home/Self Care Barriers to Discharge: Continued Medical Work up   Patient Goals and CMS Choice Patient states their goals for this hospitalization and ongoing recovery are:: Pt wants to return home at discharge. CMS Medicare.gov Compare Post Acute Care list provided to:: Patient Choice offered to / list presented to : Patient  Expected Discharge Plan and Services Expected Discharge Plan: Home/Self Care     Post Acute Care Choice:  Resumption of Svcs/PTA Provider Living arrangements for the past 2 months: Single Family Home                                      Prior Living Arrangements/Services Living arrangements for the past 2 months: Single Family Home Lives with:: Roommate Patient language and need for interpreter reviewed:: Yes Do you feel safe going back to the place where you live?: Yes      Need for Family Participation in Patient Care: Yes (Comment) Care giver support system in place?: Yes (comment) Current home services: DME Criminal Activity/Legal Involvement Pertinent to Current Situation/Hospitalization: No - Comment as needed  Activities of Daily Living Home Assistive Devices/Equipment: Walker (specify type), Cane (specify quad or straight) ADL Screening (condition at time of admission) Patient's cognitive ability adequate to safely complete daily activities?: Yes Is the patient deaf or have difficulty hearing?: No Does the patient have difficulty seeing, even when wearing glasses/contacts?: No Does the patient have difficulty concentrating, remembering, or making decisions?: No Patient able to express need for assistance with ADLs?: Yes Does the patient have difficulty dressing or bathing?: No Independently performs ADLs?: Yes (appropriate for developmental age) Does the patient have difficulty walking or climbing stairs?: No Weakness of Legs: None Weakness of Arms/Hands: None  Permission Sought/Granted Permission sought to share information with : Family Supports Permission granted to share information with : Yes, Verbal Permission Granted  Share Information with NAME: Tammy     Permission granted to share info w Relationship: Niece/POA     Emotional Assessment Appearance:: Appears stated age Attitude/Demeanor/Rapport: Engaged Affect (typically observed): Pleasant Orientation: : Oriented to Self, Oriented to Place, Oriented to  Time, Oriented to Situation Alcohol / Substance  Use: Not Applicable Psych Involvement: No (comment)  Admission diagnosis:  Slurred speech [R47.81] Confusion [R41.0] Expressive aphasia [R47.01] Fall, initial encounter [W19.XXXA] Fracture of dental restoration [K08.539] Patient Active Problem List   Diagnosis Date Noted   Dysphasia 01/15/2022   Alcohol use    Tibial fracture 09/28/2021   Glaucoma 09/28/2021   Refusal of blood product 09/28/2021   Acute cystitis without hematuria    Closed nondisplaced fracture of right patella    Multiple rib fractures 09/27/2021   Gastroesophageal reflux disease without esophagitis 09/26/2020   Primary hypertension 09/26/2020   Tremor 11/19/2017   Vertigo 05/19/2017   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   Gait disorder 09/11/2015   Urinary frequency 07/14/2014   Trigeminal neuralgia 05/16/2014   Ingrown nail 12/13/2013   Onychomycosis 12/13/2013   Pain in lower limb 12/13/2013   Multiple sclerosis ([place]) 09/12/2013   Fatigue 09/12/2013   LBP (low back pain) 09/12/2013   Cannot sleep 09/12/2013   Cephalalgia 09/12/2013   Anxiety disorder 09/12/2013   Arthralgia of shoulder 09/12/2013   Extremity pain 09/12/2013   Fothergill's neuralgia 09/12/2013   Jerking gait 09/12/2013   Avitaminosis D 09/12/2013   Hypercholesterolemia 09/12/2013   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

## 2022-01-17 NOTE — Progress Notes (Signed)
Physical Therapy Treatment Patient Details Name: Crystal Huber MRN: [id number] DOB: Jan 25, 1946 Today's Date: 01/17/2022   History of Present Illness Pt is a 76 y/o female who presented with AMS s/p mechanical fall at home. Imahing negative for acute fx or infarct. EEG negative. PMH: MS, trigeminal neuralgia, HTN, HLD, breast cancer, GAD, etoh use    PT Comments    Patient progressing with mobility and no signs of orthostatic hypotension this session, though still with significant cognitive issues that I fear may impact safety with multiple meds that she manages and this episode of pre-syncope.  She had fall about 3 months ago as well with knee injury she reports due to rushing.  She states she has done better not rushing since and had HHPT and outpatient PT after that fall.  However, continue to feel she will need SNF level rehab prior to d/c and spoke with niece via phone regarding plans following rehab as well.  PT will continue to follow acutely.    Orthostatic VS for the past 24 hrs (Last 3 readings):  BP- Lying BP- Sitting Pulse- Sitting BP- Standing at 0 minutes BP- Standing at 3 minutes Pulse- Standing at 3 minutes  01/17/22 1400 142/69 150/88 65 153/75 142/77 65     Recommendations for follow up therapy are one component of a multi-disciplinary discharge planning process, led by the attending physician.  Recommendations may be updated based on patient status, additional functional criteria and insurance authorization.  Follow Up Recommendations  Skilled nursing-short term rehab (<3 hours/day) Can patient physically be transported by private vehicle: Yes   Assistance Recommended at Discharge Intermittent Supervision/Assistance  Patient can return home with the following A little help with walking and/or transfers;A little help with bathing/dressing/bathroom;Help with stairs or ramp for entrance;Assistance with cooking/housework;Assist for transportation   Equipment Recommendations  None  recommended by PT    Recommendations for Other Services       Precautions / Restrictions Precautions Precautions: Fall Precaution Comments: Watch BP     Mobility  Bed Mobility Overal bed mobility: Needs Assistance       Supine to sit: Supervision Sit to supine: Supervision   General bed mobility comments: assist for safety    Transfers Overall transfer level: Needs assistance Equipment used: Rolling walker (2 wheels) Transfers: Sit to/from Stand Sit to Stand: Min guard           General transfer comment: up from EOB assist for balance    Ambulation/Gait Ambulation/Gait assistance: Min guard Gait Distance (Feet): 130 Feet Assistive device: Rolling walker (2 wheels) Gait Pattern/deviations: Step-through pattern, Decreased stride length       General Gait Details: mobilizing with S/minguard for safety mild imbalance on turns and one episode hitting doorfacing on entry back into room   Stairs             Wheelchair Mobility    Modified Rankin (Stroke Patients Only)       Balance Overall balance assessment: History of Falls, Needs assistance Sitting-balance support: Feet supported Sitting balance-Leahy Scale: Good     Standing balance support: Bilateral upper extremity supported, Reliant on assistive device for balance Standing balance-Leahy Scale: Poor Standing balance comment: UE support for balance                            Cognition Arousal/Alertness: Awake/alert Behavior During Therapy: Impulsive Overall Cognitive Status: History of cognitive impairments - at baseline  Current Attention Level: Selective Memory: Decreased short-term memory   Safety/Judgement: Decreased awareness of safety   Problem Solving: Slow processing, Requires verbal cues          Exercises      General Comments General comments (skin integrity, edema, etc.): BP stable today with mobility; long discussion about  follow up plans and pt could not understand fully need for 24/7 assist for safety though she had already planned to get life alert button for home.  She states manages her own meds though had issues with weakness/fall likely due to low BP on meds at home.  Discussed concerns and she states she will defer to family.      Pertinent Vitals/Pain Pain Assessment Pain Assessment: No/denies pain    Home Living                          Prior Function            PT Goals (current goals can now be found in the care plan section) Progress towards PT goals: Progressing toward goals    Frequency    Min 2X/week      PT Plan Current plan remains appropriate    Co-evaluation              AM-PAC PT "6 Clicks" Mobility   Outcome Measure  Help needed turning from your back to your side while in a flat bed without using bedrails?: A Little Help needed moving from lying on your back to sitting on the side of a flat bed without using bedrails?: A Little Help needed moving to and from a bed to a chair (including a wheelchair)?: A Little Help needed standing up from a chair using your arms (e.g., wheelchair or bedside chair)?: A Little Help needed to walk in hospital room?: A Little Help needed climbing 3-5 steps with a railing? : Total 6 Click Score: 16    End of Session Equipment Utilized During Treatment: Gait belt Activity Tolerance: Patient tolerated treatment well Patient left: in bed;with bed alarm set   PT Visit Diagnosis: Other abnormalities of gait and mobility (R26.89);History of falling (Z91.81);Other symptoms and signs involving the nervous system (R29.898)     Time: 3244-0102 PT Time Calculation (min) (ACUTE ONLY): 43 min  Charges:  $Gait Training: 8-22 mins $Therapeutic Activity: 8-22 mins $Self Care/Home Management: 8-22                     Magda Kiel, PT Acute Rehabilitation Services [phone] 01/17/2022    Reginia Naas 01/17/2022,  6:05 PM

## 2022-01-17 NOTE — Plan of Care (Signed)

## 2022-01-17 NOTE — Progress Notes (Signed)
PROGRESS NOTE    Crystal Huber  SWF:[id number] DOB: 1945-07-06 DOA: 01/15/2022 PCP: Crystal Chandler, NP   Chief Complaint  Patient presents with   Code Stroke    Brief Narrative:   Crystal Huber is a 76 y.o. female with medical history significant for relapsing remitting multiple sclerosis, trigeminal neuralgia, HTN, HLD, right breast cancer, GAD, alcohol use who presented to the ED for evaluation of new onset dysphasia.  Concerns were for acute ischemic event versus MS flare, MRI brain was obtained, I have discussed finding on MRI brain, old chronic findings, no concern for any ischemic events or MS flare, but patient was found to be profoundly orthostatic.   Assessment & Plan:   Principal Problem:   Dysphasia Active Problems:   Multiple sclerosis ([place])   Primary hypertension   Trigeminal neuralgia   Anxiety disorder   Alcohol use   Dysphasia Generalized weakness Fall - Presenting with mild expressive dysphasia after a fall at home. -CT head negative for acute changes -CTA head/neck negative for intracranial LVO or significant stenosis - EEG diffuse encephalopathy, no evidence of epileptiform activity -MRI brain with no acute intracranial abnormality, but significant for scattered L2/FLAIR involving the supratentorial cerebral white matter, I have discussed these findings with neurology on-call Crystal Huber, neck findings, and no concern for cute ischemia or MS flare . -Findings most likely elated to orthostasis, from multiple antihypertensive medications as well from food intake, this has improved with IV fluids . -PT/OT/SLP consulted, recommendation for SNF placement    Multiple sclerosis ([place]) Relapsing remitting MS not on active treatment.  she Follows with neurology, Crystal Huber.   Primary hypertension Orthostasis -Continue to hold antihypertensive medication given she is orthostatic-was orthostatic initially, sitting up 112/55, standing was 65/45, continue to hold  meds. -Still appears to be clinically dehydrated, continue with IV fluids -Not orthostatic anymore, but blood pressure remains soft, continue to hold antihypertensive medications including hydralazine, losartan and Toprol-XL.   Trigeminal neuralgia She has been on chronic Lamictal and gabapentin for management.  For some reason Lamictal was discontinued from her med list yesterday 10/30 with discontinue reason being completed course otherwise no documentation. -Continue chronic Lamictal 150 mg TID -Continue home gabapentin -Follow-up with neurology on 11/20 as scheduled   Alcohol use Per documentation, patient's roommate reported to EMS about heavy alcohol use.  -Patient reports she quit drinking 3 months ago.  Since history, so we will continue with CIWA protocol for now and thiamine.   Anxiety disorder Continue Celexa.   Will check right wrist x-ray  DVT prophylaxis: lovenox Code Status: (Full) Family Communication: none at bedside Disposition:   Status is: Inpatient    Consultants:  neurology  Subjective:  No significant events overnight, patient reports she still feeling weak, with some dizziness, she ambulated today with no orthostasis. Objective: Vitals:   01/16/22 2317 01/17/22 0314 01/17/22 0735 01/17/22 1141  BP: (!) 143/78 (!) 165/71 (!) 155/81 116/63  Pulse: 69 (!) 58 (!) 56 63  Resp: '18 18 20 19  '$ Temp: 98.5 F (36.9 C) 98.6 F (37 C) 98 F (36.7 C) 99.1 F (37.3 C)  TempSrc: Oral  Oral Oral  SpO2: 92% 96% 96% 97%  Weight:      Height:        Intake/Output Summary (Last 24 hours) at 01/17/2022 1153 Last data filed at 01/17/2022 0736 Gross per 24 hour  Intake 1432.33 ml  Output 1200 ml  Net 232.33 ml   [employer]  01/15/22 1600 01/15/22 1744  Weight: 58.8 kg 56.7 kg    Examination:   Awake Alert, Oriented X 3, more appropriate today,small bruise  in the upper lip area, and right wrist from fall. Symmetrical Chest wall movement, Good air  movement bilaterally, CTAB RRR,No Gallops,Rubs or new Murmurs, No Parasternal Heave +ve B.Sounds, Abd Soft, No tenderness, No rebound - guarding or rigidity. No Cyanosis, Clubbing or edema, No new Rash or bruise      Data Reviewed: I have personally reviewed following labs and imaging studies  CBC: Recent Labs  Lab 01/15/22 1640 01/15/22 1644 01/16/22 0514 01/17/22 0351  WBC 4.8  --  7.0 5.0  NEUTROABS 2.9  --   --   --   HGB 12.7 13.3 12.3 11.9*  HCT 37.4 39.0 37.5 34.8*  MCV 93.3  --  95.4 92.6  PLT 205  --  201 818    Basic Metabolic Panel: Recent Labs  Lab 01/15/22 1640 01/15/22 1644 01/16/22 0514 01/17/22 0351  NA 134* 133* 135 134*  K 3.7 3.7 4.0 3.5  CL 99 99 102 100  CO2 24  --  24 26  GLUCOSE 122* 122* 113* 88  BUN '12 12 9 10  '$ CREATININE 0.92 0.80 0.86 0.75  CALCIUM 9.4  --  9.1 8.8*  PHOS  --   --   --  3.1    GFR: Estimated Creatinine Clearance: 49.5 mL/min (by C-G formula based on SCr of 0.75 mg/dL).  Liver Function Tests: Recent Labs  Lab 01/15/22 1640  AST 22  ALT 14  ALKPHOS 91  BILITOT 0.4  PROT 6.6  ALBUMIN 3.9    CBG: Recent Labs  Lab 01/15/22 1640  GLUCAP 122*     No results found for this or any previous visit (from the past 240 hour(s)).       Radiology Studies: DG Wrist 2 Views Right  Result Date: 01/16/2022 CLINICAL DATA:  Right wrist swelling EXAM: RIGHT WRIST - 2 VIEW COMPARISON:  None Available. FINDINGS: No evidence of fracture or dislocation. Partially visualized mild IP joint degenerative changes. Diffuse demineralization. Ill-defined sclerotic lesion of the distal fifth metacarpal compatible with a bone island. Swelling of the wrist. IMPRESSION: Swelling of the wrist. No evidence of acute fracture or dislocation. Electronically Signed   By: Yetta Huber M.D.   On: 01/16/2022 13:30   EEG adult  Result Date: 01/16/2022 Crystal Havens, MD     01/16/2022  8:59 AM Patient Name: Crystal Huber MRN: [id number]  Epilepsy Crystal Huber: Crystal Huber Referring Physician/Provider: Lenore Cordia, MD Date: 01/16/2022 Duration: 23.23 mins Patient history: 76yo F with mild expressive aphasia. EEG to evaluate for seizure. Level of alertness: Awake AEDs during EEG study: LTG, GBP Technical aspects: This EEG study was done with scalp electrodes positioned according to the 10-20 International system of electrode placement. Electrical activity was reviewed with band pass filter of 1-'70Hz'$ , sensitivity of 7 uV/mm, display speed of 28m/sec with a '60Hz'$  notched filter applied as appropriate. EEG data were recorded continuously and digitally stored.  Video monitoring was available and reviewed as appropriate. Description: EEG showed continuous generalized 3 to 5 Hz theta-delta slowing. Hyperventilation and photic stimulation were not performed.   ABNORMALITY - Continuous slow, generalized IMPRESSION: This study is suggestive of moderate to severe diffuse encephalopathy, nonspecific etiology. No seizures or epileptiform discharges were seen throughout the recording. Priyanka OBarbra Sarks  CT HEAD WO CONTRAST (5MM)  Result Date: 01/16/2022 CLINICAL DATA:  Fell and hit head. Now with head and neck pain. There is a known history of multiple sclerosis. EXAM: CT HEAD WITHOUT CONTRAST CT CERVICAL SPINE WITHOUT CONTRAST TECHNIQUE: Multidetector CT imaging of the head and cervical spine was performed following the standard protocol without intravenous contrast. Multiplanar CT image reconstructions of the cervical spine were also generated. RADIATION DOSE REDUCTION: This exam was performed according to the departmental dose-optimization program which includes automated exposure control, adjustment of the mA and/or kV according to patient size and/or use of iterative reconstruction technique. COMPARISON:  Head CT 01/15/2022, head CT 09/27/2021, cervical spine CT 09/27/2021. FINDINGS: CT HEAD FINDINGS Brain: There is mild global atrophy with mild  atrophic ventriculomegaly. Moderate patchy hypoattenuating cerebral white matter disease is again noted, disproportionately prominent present again in the right frontal lobe which may relate to prior history of MS. There is a small chronic lacunar infarct laterally in the left cerebellar hemisphere. No new asymmetry is seen concerning for an acute cortical based infarct, hemorrhage, mass or midline shift. Basal cisterns are clear. Vascular: Scattered calcific plaque both carotid siphons, distal left vertebral artery. No hyperdense central vessel. Skull: Negative for fractures or focal lesions. Sinuses/Orbits: No acute finding. Other: None. CT CERVICAL SPINE FINDINGS Alignment: Minimal chronic degenerative anterolisthesis is again noted at C4-5 and C7-T1 No traumatic or further listhesis is seen. Narrowing and spurring of the anterior atlantodental joint is again noted. Slight cervical levoscoliosis. Skull base and vertebrae: There is osteopenia without evidence of fractures or focal bone lesion. A nuchal ligament calcific body is again noted posteriorly at C4-5. Soft tissues and spinal canal: No prevertebral fluid or swelling. No visible canal hematoma. There are mild calcifications in the proximal cervical ICAs. Disc levels: Variable disc degeneration noted at C4-5 through C7-T1, greatest C5-6 and C6-7. The C2-3 and C3-4 discs are normal in height. There is mild cervical spondylosis. No herniated discs or cord compromise are seen. There are facet joint and uncinate spurs at most levels, with moderate foraminal stenosis noted on the right at C5-6 and C6-7. Other foramina appear clear. Upper chest: Negative. Other: None. IMPRESSION: 1. No acute intracranial CT findings or depressed skull fractures. 2. Stable atrophy and white matter disease. 3. Osteopenia and degenerative change without evidence of cervical fractures or traumatic listhesis. 4. Carotid arthrosclerosis. Electronically Signed   By: Telford Nab M.D.    On: 01/16/2022 05:09   CT Cervical Spine Wo Contrast  Result Date: 01/16/2022 CLINICAL DATA:  [place] and hit head. Now with head and neck pain. There is a known history of multiple sclerosis. EXAM: CT HEAD WITHOUT CONTRAST CT CERVICAL SPINE WITHOUT CONTRAST TECHNIQUE: Multidetector CT imaging of the head and cervical spine was performed following the standard protocol without intravenous contrast. Multiplanar CT image reconstructions of the cervical spine were also generated. RADIATION DOSE REDUCTION: This exam was performed according to the departmental dose-optimization program which includes automated exposure control, adjustment of the mA and/or kV according to patient size and/or use of iterative reconstruction technique. COMPARISON:  Head CT 01/15/2022, head CT 09/27/2021, cervical spine CT 09/27/2021. FINDINGS: CT HEAD FINDINGS Brain: There is mild global atrophy with mild atrophic ventriculomegaly. Moderate patchy hypoattenuating cerebral white matter disease is again noted, disproportionately prominent present again in the right frontal lobe which may relate to prior history of MS. There is a small chronic lacunar infarct laterally in the left cerebellar hemisphere. No new asymmetry is seen concerning for an acute cortical based infarct, hemorrhage, mass or midline shift. Basal  cisterns are clear. Vascular: Scattered calcific plaque both carotid siphons, distal left vertebral artery. No hyperdense central vessel. Skull: Negative for fractures or focal lesions. Sinuses/Orbits: No acute finding. Other: None. CT CERVICAL SPINE FINDINGS Alignment: Minimal chronic degenerative anterolisthesis is again noted at C4-5 and C7-T1 No traumatic or further listhesis is seen. Narrowing and spurring of the anterior atlantodental joint is again noted. Slight cervical levoscoliosis. Skull base and vertebrae: There is osteopenia without evidence of fractures or focal bone lesion. A nuchal ligament calcific body is again  noted posteriorly at C4-5. Soft tissues and spinal canal: No prevertebral fluid or swelling. No visible canal hematoma. There are mild calcifications in the proximal cervical ICAs. Disc levels: Variable disc degeneration noted at C4-5 through C7-T1, greatest C5-6 and C6-7. The C2-3 and C3-4 discs are normal in height. There is mild cervical spondylosis. No herniated discs or cord compromise are seen. There are facet joint and uncinate spurs at most levels, with moderate foraminal stenosis noted on the right at C5-6 and C6-7. Other foramina appear clear. Upper chest: Negative. Other: None. IMPRESSION: 1. No acute intracranial CT findings or depressed skull fractures. 2. Stable atrophy and white matter disease. 3. Osteopenia and degenerative change without evidence of cervical fractures or traumatic listhesis. 4. Carotid arthrosclerosis. Electronically Signed   By: Telford Nab M.D.   On: 01/16/2022 05:09   MR BRAIN WO CONTRAST  Result Date: 01/16/2022 CLINICAL DATA:  Initial evaluation for mental status change, unknown cause. EXAM: MRI HEAD WITHOUT CONTRAST TECHNIQUE: Multiplanar, multiecho pulse sequences of the brain and surrounding structures were obtained without intravenous contrast. COMPARISON:  Prior CTs from earlier the same day. FINDINGS: Brain: Examination moderately to severely degraded by motion artifact. Generalized age-related cerebral atrophy. Scattered patchy and confluent T2/FLAIR hyperintensity seen involving the periventricular and deep white matter both cerebral hemispheres, nonspecific, but presumably in large part related to history of demyelinating disease. A component of chronic microvascular ischemic disease may be contributory as well. No evidence for acute or subacute ischemia. Gray-white matter differentiation maintained. No visible acute or chronic intracranial blood products. No mass lesion, midline shift or mass effect. No hydrocephalus or extra-axial fluid collection. No visible  signal changes to suggest active demyelination. Vascular: Major intracranial vascular flow voids are maintained. Skull and upper cervical spine: Craniocervical junction within normal limits. Bone marrow scan signal intensity grossly normal. No scalp soft tissue abnormality. Sinuses/Orbits: Globes orbital soft tissues within normal limits. Paranasal sinuses are largely clear. No significant mastoid effusion. Other: None. IMPRESSION: 1. Motion degraded exam. 2. No acute intracranial abnormality. 3. Scattered T2/FLAIR hyperintensity involving the supratentorial cerebral white matter, nonspecific, but presumably in large part related to history of demyelinating disease. A component of chronic microvascular ischemic disease could be contributory as well. Electronically Signed   By: Jeannine Boga M.D.   On: 01/16/2022 00:39   CT ANGIO HEAD NECK W WO CM W PERF (CODE STROKE)  Result Date: 01/15/2022 CLINICAL DATA:  Acute neuro deficit.  Speech deficit aphasia. EXAM: CT ANGIOGRAPHY HEAD AND NECK CT PERFUSION HEAD TECHNIQUE: Multidetector CT imaging of the head and neck was performed using the standard protocol during bolus administration of intravenous contrast. Multiplanar CT image reconstructions and MIPs were obtained to evaluate the vascular anatomy. Carotid stenosis measurements (when applicable) are obtained utilizing NASCET criteria, using the distal internal carotid diameter as the denominator. RADIATION DOSE REDUCTION: This exam was performed according to the departmental dose-optimization program which includes automated exposure control, adjustment of the mA and/or  kV according to patient size and/or use of iterative reconstruction technique. CONTRAST:  159m OMNIPAQUE IOHEXOL 350 MG/ML SOLN COMPARISON:  CT head 01/15/2022 FINDINGS: CTA NECK FINDINGS Aortic arch: Standard branching. Imaged portion shows no evidence of aneurysm or dissection. No significant stenosis of the major arch vessel origins.  Right carotid system: Mild atherosclerotic calcification right carotid bifurcation. Negative for carotid stenosis. Left carotid system: Mild atherosclerotic calcification left carotid bifurcation. Negative for stenosis. Vertebral arteries: Both vertebral arteries are patent to the skull base without significant stenosis. Skeleton: Mild degenerative change cervical spine. No acute skeletal abnormality. Other neck: Negative for mass or adenopathy in the neck. Upper chest: Lung apices clear bilaterally. Review of the MIP images confirms the above findings CTA HEAD FINDINGS Anterior circulation: Mild atherosclerotic calcification in the cavernous carotid bilaterally. Negative for stenosis. Anterior and middle cerebral arteries widely patent. No large vessel occlusion or flow limiting stenosis. Negative for aneurysm. Posterior circulation: Both vertebral arteries patent to the basilar. Mild atherosclerotic disease distal left vertebral artery that significant stenosis. Basilar widely patent. Superior cerebellar and posterior cerebral arteries patent. No large vessel occlusion or aneurysm. Venous sinuses: Normal venous enhancement. Hypoplastic right transverse sinus. Anatomic variants: None Review of the MIP images confirms the above findings CT PERFUSION HEAD No area of cerebral infarction. Negative for delayed perfusion or ischemia. IMPRESSION: 1. Negative for intracranial large vessel occlusion or significant stenosis. 2. Mild atherosclerotic disease in the carotid bifurcation bilaterally without stenosis. Both vertebral arteries widely patent. 3. Negative CT perfusion head Electronically Signed   By: CFranchot GalloM.D.   On: 01/15/2022 17:29   CT HEAD CODE STROKE WO CONTRAST  Result Date: 01/15/2022 CLINICAL DATA:  Code stroke.  Neuro deficit, acute, stroke suspected EXAM: CT HEAD WITHOUT CONTRAST TECHNIQUE: Contiguous axial images were obtained from the base of the skull through the vertex without intravenous  contrast. RADIATION DOSE REDUCTION: This exam was performed according to the departmental dose-optimization program which includes automated exposure control, adjustment of the mA and/or kV according to patient size and/or use of iterative reconstruction technique. COMPARISON:  CT head 09/27/2021. FINDINGS: Brain: No evidence of acute large vascular territory infarction, hemorrhage, hydrocephalus, extra-axial collection or mass lesion/mass effect. Similar patchy white matter hypodensities. Remote left cerebellar infarct. Vascular: No hyperdense vessel identified. Calcific atherosclerosis. Skull: No acute fracture. Sinuses/Orbits: Clear sinuses.  No acute orbital findings. Other: No mastoid effusions. ASPECTS ([facility] Early CT Score) total score (0-10 with 10 being normal): 10. IMPRESSION: 1. No evidence of acute large vascular territory infarct or acute hemorrhage. ASPECTS is 10 2. Similar white matter hypodensities, which could be related to chronic microvascular ischemic disease and/or multiple sclerosis given the patient's clinical history. MRI could further characterize if clinically warranted. Electronically Signed   By: FMargaretha SheffieldM.D.   On: 01/15/2022 16:58        Scheduled Meds:  aspirin EC  81 mg Oral Daily   citalopram  20 mg Oral Daily   enoxaparin (LOVENOX) injection  40 mg Subcutaneous Q24H   gabapentin  300-600 mg Oral TID   lamoTRIgine  150 mg Oral TID   latanoprost  1 drop Both Eyes QHS   meclizine  25 mg Oral Daily   timolol  1 drop Both Eyes q morning   Continuous Infusions:  lactated ringers 50 mL/hr at 01/17/22 0848   thiamine (VITAMIN B1) injection 100 mL/hr at 01/17/22 0848     LOS: 0 days       DEmeline Gins  Jonel Sick, MD [facility]   To contact the Crystal Huber provider between 7A-7P or the covering provider during after hours 7P-7A, please log into the web site [email or web address] and access using universal [place] password for that web site.  If you do not have the password, please call the hospital operator.  01/17/2022, 11:53 AM

## 2022-01-17 NOTE — Care Management Obs Status (Signed)
[place] NOTIFICATION   Patient Details  Name: CAMBREY LUPI MRN: [id number] Date of Birth: [birth year]   Medicare Observation Status Notification Given:  Yes    Coralee P[place], [place] 01/17/2022, 10:05 AM

## 2022-01-17 NOTE — Progress Notes (Addendum)
Mobility Specialist: Progress Note   01/17/22 1043  Mobility  Activity Ambulated with assistance in hallway  Level of Assistance Minimal assist, patient does 75% or more  Assistive Device Front wheel walker  Distance Ambulated (ft) 300 ft  Activity Response Tolerated fair  Mobility Referral Yes  $Mobility charge 1 Mobility   Pre-Mobility:     Supine: 109/70 (79) BP    Sitting EOB: 127/65 (84) BP    Standing: 117/76 (86) BP    Standing 3 minutes (104/76 (84) BP Post-Mobility: 134/68 (87) BP  Pt received in the bed and agreeable to mobility. Assisted with pericare with help from NT. Assessed orthostatics (as seen above), c/o mild dizziness with standing and ambulation. Lateral drift L and R throughout; posterior lean x2 requiring minA for balance. Pt to the chair after session with call bell in her lap. Chair alarm is on.   [place] Crystal Huber Mobility Specialist Secure Chat Only

## 2022-01-18 DIAGNOSIS — S00531A Contusion of lip, initial encounter: Secondary | ICD-10-CM | POA: Diagnosis present

## 2022-01-18 DIAGNOSIS — G8929 Other chronic pain: Secondary | ICD-10-CM | POA: Diagnosis present

## 2022-01-18 DIAGNOSIS — I10 Essential (primary) hypertension: Secondary | ICD-10-CM | POA: Diagnosis present

## 2022-01-18 DIAGNOSIS — G500 Trigeminal neuralgia: Secondary | ICD-10-CM | POA: Diagnosis present

## 2022-01-18 DIAGNOSIS — Z880 Allergy status to penicillin: Secondary | ICD-10-CM | POA: Diagnosis not present

## 2022-01-18 DIAGNOSIS — F411 Generalized anxiety disorder: Secondary | ICD-10-CM | POA: Diagnosis present

## 2022-01-18 DIAGNOSIS — R4702 Dysphasia: Secondary | ICD-10-CM | POA: Diagnosis present

## 2022-01-18 DIAGNOSIS — Z803 Family history of malignant neoplasm of breast: Secondary | ICD-10-CM | POA: Diagnosis not present

## 2022-01-18 DIAGNOSIS — R4701 Aphasia: Secondary | ICD-10-CM | POA: Diagnosis present

## 2022-01-18 DIAGNOSIS — E860 Dehydration: Secondary | ICD-10-CM | POA: Diagnosis present

## 2022-01-18 DIAGNOSIS — Z90711 Acquired absence of uterus with remaining cervical stump: Secondary | ICD-10-CM | POA: Diagnosis not present

## 2022-01-18 DIAGNOSIS — Z882 Allergy status to sulfonamides status: Secondary | ICD-10-CM | POA: Diagnosis not present

## 2022-01-18 DIAGNOSIS — H409 Unspecified glaucoma: Secondary | ICD-10-CM | POA: Diagnosis present

## 2022-01-18 DIAGNOSIS — G35 Multiple sclerosis: Secondary | ICD-10-CM | POA: Diagnosis present

## 2022-01-18 DIAGNOSIS — R4781 Slurred speech: Secondary | ICD-10-CM | POA: Diagnosis present

## 2022-01-18 DIAGNOSIS — K219 Gastro-esophageal reflux disease without esophagitis: Secondary | ICD-10-CM | POA: Diagnosis present

## 2022-01-18 DIAGNOSIS — E785 Hyperlipidemia, unspecified: Secondary | ICD-10-CM | POA: Diagnosis present

## 2022-01-18 DIAGNOSIS — Z79899 Other long term (current) drug therapy: Secondary | ICD-10-CM | POA: Diagnosis not present

## 2022-01-18 DIAGNOSIS — Z888 Allergy status to other drugs, medicaments and biological substances status: Secondary | ICD-10-CM | POA: Diagnosis not present

## 2022-01-18 DIAGNOSIS — W19XXXA Unspecified fall, initial encounter: Secondary | ICD-10-CM | POA: Diagnosis present

## 2022-01-18 DIAGNOSIS — I951 Orthostatic hypotension: Secondary | ICD-10-CM | POA: Diagnosis present

## 2022-01-18 DIAGNOSIS — Z87891 Personal history of nicotine dependence: Secondary | ICD-10-CM | POA: Diagnosis not present

## 2022-01-18 DIAGNOSIS — Z853 Personal history of malignant neoplasm of breast: Secondary | ICD-10-CM | POA: Diagnosis not present

## 2022-01-18 DIAGNOSIS — K0381 Cracked tooth: Secondary | ICD-10-CM | POA: Diagnosis present

## 2022-01-18 DIAGNOSIS — M25531 Pain in right wrist: Secondary | ICD-10-CM | POA: Diagnosis not present

## 2022-01-18 LAB — GLUCOSE, CAPILLARY: Glucose-Capillary: 95 mg/dL (ref 70–99)

## 2022-01-18 MED ORDER — ALUM & MAG HYDROXIDE-SIMETH 200-200-20 MG/5ML PO SUSP
30.0000 mL | Freq: Four times a day (QID) | ORAL | Status: DC | PRN
  Administered 2022-01-18: 30 mL via ORAL
  Filled 2022-01-18: qty 30

## 2022-01-18 MED ORDER — METOPROLOL SUCCINATE ER 50 MG PO TB24
50.0000 mg | ORAL_TABLET | Freq: Every day | ORAL | Status: DC
  Administered 2022-01-18 – 2022-01-21 (×4): 50 mg via ORAL
  Filled 2022-01-18 (×4): qty 1

## 2022-01-18 MED ORDER — POTASSIUM CHLORIDE CRYS ER 20 MEQ PO TBCR
20.0000 meq | EXTENDED_RELEASE_TABLET | Freq: Once | ORAL | Status: AC
  Administered 2022-01-18: 20 meq via ORAL
  Filled 2022-01-18: qty 1

## 2022-01-18 NOTE — Plan of Care (Signed)
  Problem: Education: Goal: Knowledge of disease or condition will improve Outcome: Not Applicable Goal: Knowledge of secondary prevention will improve (MUST DOCUMENT ALL) Outcome: Not Applicable Goal: Knowledge of patient specific risk factors will improve [place] N/A or DELETE if not current risk factor) Outcome: Not Applicable   Problem: Ischemic Stroke/TIA Tissue Perfusion: Goal: Complications of ischemic stroke/TIA will be minimized Outcome: Not Applicable   Problem: Coping: Goal: Will verbalize positive feelings about self Outcome: Progressing Goal: Will identify appropriate support needs Outcome: Not Applicable   Problem: Health Behavior/Discharge Planning: Goal: Ability to manage health-related needs will improve Outcome: Progressing   Problem: Self-Care: Goal: Ability to participate in self-care as condition permits will improve Outcome: Progressing Goal: Verbalization of feelings and concerns over difficulty with self-care will improve Outcome: Not Applicable Goal: Ability to communicate needs accurately will improve Outcome: Progressing   Problem: Pain Managment: Goal: General experience of comfort will improve Outcome: Progressing   Problem: Safety: Goal: Ability to remain free from injury will improve Outcome: Progressing   Problem: Skin Integrity: Goal: Risk for impaired skin integrity will decrease Outcome: Progressing

## 2022-01-18 NOTE — Evaluation (Signed)
Speech Language Pathology Evaluation Patient Details Name: Crystal Huber MRN: [id number] DOB: [birth year] Today's Date: 01/18/2022 Time: 5852-7782 SLP Time Calculation (min) (ACUTE ONLY): 27 min  Problem List:  Patient Active Problem List   Diagnosis Date Noted   Aphasia 01/18/2022   Dysphasia 01/15/2022   Alcohol use    Tibial fracture 09/28/2021   Glaucoma 09/28/2021   Refusal of blood product 09/28/2021   Acute cystitis without hematuria    Closed nondisplaced fracture of right patella    Multiple rib fractures 09/27/2021   Gastroesophageal reflux disease without esophagitis 09/26/2020   Primary hypertension 09/26/2020   Tremor 11/19/2017   Vertigo 05/19/2017   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   Gait disorder 09/11/2015   Urinary frequency 07/14/2014   Trigeminal neuralgia 05/16/2014   Ingrown nail 12/13/2013   Onychomycosis 12/13/2013   Pain in lower limb 12/13/2013   Multiple sclerosis ([place]) 09/12/2013   Fatigue 09/12/2013   LBP (low back pain) 09/12/2013   Cannot sleep 09/12/2013   Cephalalgia 09/12/2013   Anxiety disorder 09/12/2013   Arthralgia of shoulder 09/12/2013   Extremity pain 09/12/2013   Fothergill's neuralgia 09/12/2013   Jerking gait 09/12/2013   Avitaminosis D 09/12/2013   Hypercholesterolemia 09/12/2013   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   Past Medical History:  Past Medical History:  Diagnosis Date   Arthritis    right pinkie   Cancer ([place])    right BR  CA    Cataract    right eye   GERD (gastroesophageal reflux disease)    prn tums, mild not often   Glaucoma    Hyperlipidemia    Hypertension    Multiple sclerosis ([place])    Neuromuscular disorder ([place])    trigeminal neuralgia   Refusal of blood product    Trigeminal neuralgia of left side of face    Past Surgical History:  Past Surgical History:  Procedure Laterality Date   BREAST LUMPECTOMY Right    wears  prosthesis   COLONOSCOPY  12/20/2010   ganglion cyst removal Bilateral    PARTIAL HYSTERECTOMY     POLYPECTOMY     HPI:  Pt is a 76 y/o female who presented with AMS s/p mechanical fall at home. Imaging negative for acute fx or infarct. EEG negative. PMH: MS, trigeminal neuralgia, HTN, HLD, breast cancer, GAD, etoh use.   Assessment / Plan / Recommendation Clinical Impression  Pt was seen for a cognitive-linguistic evaluation and she presents with mild-moderate cognitive deficits in the areas of short-term memory, sustained attention, problem solving, and executive functioning.  Pt completed portions of the [place] Mental Status Examination (SLUMS) in addition to informal evaluation measures.  She was unable to complete the full SLUMS secondary to pain.   RN was notified.  Pt reported that she lives at home with a roommate who will be able to intermittently to assist her with IADLs.  No family or friends present to establish a cognitive baseline.  Recommend continued ST and assistance with IADLs at time of discharge.    SLP Assessment  SLP Recommendation/Assessment: Patient needs continued Speech [place] Pathology Services SLP Visit Diagnosis: Cognitive communication deficit (R41.841)    Recommendations for follow up therapy are one component of a multi-disciplinary discharge planning process, led by the attending physician.  Recommendations may be updated based on patient status, additional functional criteria and insurance authorization.    Follow Up Recommendations  Skilled nursing-short term rehab (<3 hours/day)  Assistance Recommended at Discharge  Intermittent Supervision/Assistance  Functional Status Assessment Patient has had a recent decline in their functional status and demonstrates the ability to make significant improvements in function in a reasonable and predictable amount of time.  Frequency and Duration min 2x/week  2 weeks      SLP  Evaluation Cognition  Overall Cognitive Status: No family/caregiver present to determine baseline cognitive functioning Arousal/Alertness: Awake/alert Orientation Level: Oriented X4 Attention: Focused;Sustained Focused Attention: Appears intact Sustained Attention: Impaired Sustained Attention Impairment: Verbal complex Memory: Impaired Memory Impairment: Decreased short term memory Decreased Short Term Memory: Verbal basic Awareness: Impaired Awareness Impairment: Intellectual impairment Problem Solving: Impaired Problem Solving Impairment: Verbal complex Executive Function: Organizing Organizing: Impaired Organizing Impairment: Functional complex       Comprehension  Auditory Comprehension Overall Auditory Comprehension: Appears within functional limits for tasks assessed    Expression Expression Primary Mode of Expression: Verbal Verbal Expression Overall Verbal Expression: Appears within functional limits for tasks assessed Written Expression Dominant Hand: Right   Oral / Motor  Motor Speech Overall Motor Speech: Appears within functional limits for tasks assessed           Bretta Bang, M.S., [place] [facility] Office: [phone]  [place] 01/18/2022, 9:50 AM

## 2022-01-18 NOTE — Progress Notes (Signed)
PROGRESS NOTE    Crystal Huber  YIF:[id number] DOB: Mar 20, 1945 DOA: 01/15/2022 PCP: [name], NP   Chief Complaint  Patient presents with   Code Stroke    Brief Narrative:   Crystal Huber is a 76 y.o. female with medical history significant for relapsing remitting multiple sclerosis, trigeminal neuralgia, HTN, HLD, right breast cancer, GAD, alcohol use who presented to the ED for evaluation of new onset dysphasia.  Concerns were for acute ischemic event versus MS flare, MRI brain was obtained, I have discussed finding on MRI brain, old chronic findings, no concern for any ischemic events or MS flare, but patient was found to be profoundly orthostatic.   Assessment & Plan:   Principal Problem:   Dysphasia Active Problems:   Multiple sclerosis ([place])   Primary hypertension   Trigeminal neuralgia   Anxiety disorder   Alcohol use   Aphasia   Aphasia Generalized weakness Fall - Presenting with mild expressive aphasia after a fall at home. -CT head negative for acute changes -CTA head/neck negative for intracranial LVO or significant stenosis - EEG diffuse encephalopathy, no evidence of epileptiform activity -MRI brain with no acute intracranial abnormality, but significant for scattered L2/FLAIR involving the supratentorial cerebral white matter, I have discussed these findings with neurology on-call Dr. Cheral Marker, he did review imaging, there is no concern for any acute ischemia or MS flare, her presentation most likely related to orthostasis. . -Findings most likely elated to orthostasis, from multiple antihypertensive medications as well from food intake, this has improved with IV fluids . -PT/OT/SLP consulted, recommendation for SNF placement    Multiple sclerosis ([place]) Relapsing remitting MS not on active treatment.  she Follows with neurology, [name].   Primary hypertension Orthostasis -Holding her antihypertensive medication given she is orthostatic-was  orthostatic initially, sitting up 112/55, standing was 65/45, continue to hold meds.  He did appear to be clinically dehydrated on presentation, this has significantly improved with IV fluids. -Her blood pressure started to increase, will resume back on Toprol-XL for now, and will continue to monitor, if needed can resume her back on her home meds including hydralazine and losartan if remains elevated. -We will add as needed hydralazine   Trigeminal neuralgia She has been on chronic Lamictal and gabapentin for management.  For some reason Lamictal was discontinued from her med list yesterday 10/30 with discontinue reason being completed course otherwise no documentation. -Continue chronic Lamictal 150 mg TID -Continue home gabapentin -Follow-up with neurology on 11/20 as scheduled   Alcohol use Per documentation, patient's roommate reported to EMS about heavy alcohol use.  -Patient reports she quit drinking 3 months ago.  Since history, so we will continue with CIWA protocol for now and thiamine.   Anxiety disorder Continue Celexa.  Trigeminal neuralgia -Continue with home gabapentin    DVT prophylaxis: lovenox Code Status: (Full) Family Communication: none at bedside Disposition: Need SNF placement, she can be discharged when SNF bed is available  Status is: Inpatient    Consultants:  neurology  Subjective:  She is complaining of left cheek pain due to trigeminal neuralgia Objective: Vitals:   01/18/22 0531 01/18/22 0730 01/18/22 0824 01/18/22 1133  BP: 137/68 (!) 180/85 (!) 159/79 (!) 153/76  Pulse: 62 63 (!) 57 61  Resp: '16 18 18 17  '$ Temp:  98.3 F (36.8 C) 97.8 F (36.6 C)   TempSrc:  Oral Oral   SpO2: 93% 98% 97% 93%  Weight:      Height:  Intake/Output Summary (Last 24 hours) at 01/18/2022 1329 Last data filed at 01/18/2022 0300 Gross per 24 hour  Intake 738.99 ml  Output --  Net 738.99 ml   Filed Weights   01/15/22 1600 01/15/22 1744  Weight: 58.8  kg 56.7 kg    Examination:   Awake Alert, Oriented X 3,frail  Symmetrical Chest wall movement, Good air movement bilaterally, CTAB RRR,No Gallops,Rubs or new Murmurs, No Parasternal Heave +ve B.Sounds, Abd Soft, No tenderness, No rebound - guarding or rigidity. No Cyanosis, Clubbing or edema, No new Rash or bruise       Data Reviewed: I have personally reviewed following labs and imaging studies  CBC: Recent Labs  Lab 01/15/22 1640 01/15/22 1644 01/16/22 0514 01/17/22 0351  WBC 4.8  --  7.0 5.0  NEUTROABS 2.9  --   --   --   HGB 12.7 13.3 12.3 11.9*  HCT 37.4 39.0 37.5 34.8*  MCV 93.3  --  95.4 92.6  PLT 205  --  201 680    Basic Metabolic Panel: Recent Labs  Lab 01/15/22 1640 01/15/22 1644 01/16/22 0514 01/17/22 0351  NA 134* 133* 135 134*  K 3.7 3.7 4.0 3.5  CL 99 99 102 100  CO2 24  --  24 26  GLUCOSE 122* 122* 113* 88  BUN '12 12 9 10  '$ CREATININE 0.92 0.80 0.86 0.75  CALCIUM 9.4  --  9.1 8.8*  PHOS  --   --   --  3.1    GFR: Estimated Creatinine Clearance: 49.5 mL/min (by C-G formula based on SCr of 0.75 mg/dL).  Liver Function Tests: Recent Labs  Lab 01/15/22 1640  AST 22  ALT 14  ALKPHOS 91  BILITOT 0.4  PROT 6.6  ALBUMIN 3.9    CBG: Recent Labs  Lab 01/15/22 1640  GLUCAP 122*     No results found for this or any previous visit (from the past 240 hour(s)).       Radiology Studies: No results found.      Scheduled Meds:  aspirin EC  81 mg Oral Daily   citalopram  20 mg Oral Daily   enoxaparin (LOVENOX) injection  40 mg Subcutaneous Q24H   gabapentin  300-600 mg Oral TID   lamoTRIgine  150 mg Oral TID   latanoprost  1 drop Both Eyes QHS   meclizine  25 mg Oral Daily   timolol  1 drop Both Eyes q morning   Continuous Infusions:  lactated ringers 50 mL/hr at 01/17/22 2100   thiamine (VITAMIN B1) injection 500 mg (01/18/22 0951)     LOS: 0 days       Phillips Climes, MD [facility]   To contact the  attending provider between 7A-7P or the covering provider during after hours 7P-7A, please log into the web site [email or web address] and access using universal [place] password for that web site. If you do not have the password, please call the hospital operator.  01/18/2022, 1:29 PM

## 2022-01-18 NOTE — NC FL2 (Signed)
[place] LEVEL OF CARE SCREENING TOOL     IDENTIFICATION  Patient Name: Crystal Huber Birthdate: 1945-06-01 Sex: female Admission Date (Current Location): 01/15/2022  [facility] and [place] Number:  Herbalist and Address:  [facility], [place] [address] [place], [place] [address]      Provider Number: [id number]  Attending Physician Name and Address:  Albertine Patricia, MD  Relative Name and Phone Number:  Mina Marble,  [phone]    Current Level of Care: Hospital Recommended Level of Care: [place] Prior Approval Number:    Date Approved/Denied:   PASRR Number: [id number]  Discharge Plan: SNF    Current Diagnoses: Patient Active Problem List   Diagnosis Date Noted   Aphasia 01/18/2022   Dysphasia 01/15/2022   Alcohol use    Tibial fracture 09/28/2021   Glaucoma 09/28/2021   Refusal of blood product 09/28/2021   Acute cystitis without hematuria    Closed nondisplaced fracture of right patella    Multiple rib fractures 09/27/2021   Gastroesophageal reflux disease without esophagitis 09/26/2020   Primary hypertension 09/26/2020   Tremor 11/19/2017   Vertigo 05/19/2017   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   Gait disorder 09/11/2015   Urinary frequency 07/14/2014   Trigeminal neuralgia 05/16/2014   Ingrown nail 12/13/2013   Onychomycosis 12/13/2013   Pain in lower limb 12/13/2013   Multiple sclerosis ([place]) 09/12/2013   Fatigue 09/12/2013   LBP (low back pain) 09/12/2013   Cannot sleep 09/12/2013   Cephalalgia 09/12/2013   Anxiety disorder 09/12/2013   Arthralgia of shoulder 09/12/2013   Extremity pain 09/12/2013   Fothergill's neuralgia 09/12/2013   Jerking gait 09/12/2013   Avitaminosis D 09/12/2013   Hypercholesterolemia 09/12/2013   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    Orientation RESPIRATION BLADDER Height &  Weight     Self, Time, Situation, Place  Normal Continent Weight: 125 lb (56.7 kg) Height:  '5\' 3"'$  (160 cm)  BEHAVIORAL SYMPTOMS/MOOD NEUROLOGICAL BOWEL NUTRITION STATUS      Continent Diet (See DC summary)  AMBULATORY STATUS COMMUNICATION OF NEEDS Skin   Limited Assist Verbally Skin abrasions (Face abrasions)                       Personal Care Assistance Level of Assistance  Bathing, Feeding, Dressing Bathing Assistance: Limited assistance Feeding assistance: Independent Dressing Assistance: Limited assistance     Functional Limitations Info  Sight, Hearing, Speech Sight Info: Impaired Hearing Info: Impaired Speech Info: Impaired    SPECIAL CARE FACTORS FREQUENCY  PT (By licensed PT), OT (By licensed OT)     PT Frequency: 5x week OT Frequency: 5x week            Contractures Contractures Info: Not present    Additional Factors Info  Code Status, Allergies, Psychotropic Code Status Info: Full Allergies Info: Carbamazepine  Imipramine  Metronidazole  Oxcarbazepine  Penicillins  Red Blood Cells  Statins  Sulfa Antibiotics Psychotropic Info: Lorazepam         Current Medications (01/18/2022):  This is the current hospital active medication list Current Facility-Administered Medications  Medication Dose Route Frequency Provider Last Rate Last Admin   acetaminophen (TYLENOL) tablet 650 mg  650 mg Oral Q4H PRN Lenore Cordia, MD   650 mg at 01/18/22 0945   Or   acetaminophen (TYLENOL) 160 MG/5ML solution 650 mg  650 mg Per Tube Q4H  PRN Lenore Cordia, MD       Or   acetaminophen (TYLENOL) suppository 650 mg  650 mg Rectal Q4H PRN Lenore Cordia, MD       aspirin EC tablet 81 mg  81 mg Oral Daily Lenore Cordia, MD   81 mg at 01/18/22 0909   citalopram (CELEXA) tablet 20 mg  20 mg Oral Daily Zada Finders R, MD   20 mg at 01/18/22 0908   enoxaparin (LOVENOX) injection 40 mg  40 mg Subcutaneous Q24H Zada Finders R, MD   40 mg at 01/18/22 3276   gabapentin  (NEURONTIN) capsule 300-600 mg  300-600 mg Oral TID Lenore Cordia, MD   600 mg at 01/18/22 1470   lactated ringers infusion   Intravenous Continuous Elgergawy, Silver Huguenin, MD 50 mL/hr at 01/17/22 2100 New Bag at 01/17/22 2100   lamoTRIgine (LAMICTAL) tablet 150 mg  150 mg Oral TID Lenore Cordia, MD   150 mg at 01/18/22 0908   latanoprost (XALATAN) 0.005 % ophthalmic solution 1 drop  1 drop Both Eyes QHS Elgergawy, Silver Huguenin, MD   1 drop at 01/17/22 2227   lip balm (CARMEX) ointment   Topical PRN Elgergawy, Silver Huguenin, MD       meclizine (ANTIVERT) tablet 25 mg  25 mg Oral Daily Elgergawy, Silver Huguenin, MD   25 mg at 01/18/22 0909   ondansetron (ZOFRAN) injection 4 mg  4 mg Intravenous Q6H PRN Lenore Cordia, MD       senna-docusate (Senokot-S) tablet 1 tablet  1 tablet Oral QHS PRN Lenore Cordia, MD       thiamine (VITAMIN B1) 500 mg in normal saline (50 mL) IVPB  500 mg Intravenous Q24H Elgergawy, Silver Huguenin, MD 100 mL/hr at 01/18/22 0951 500 mg at 01/18/22 0951   timolol (TIMOPTIC) 0.5 % ophthalmic solution 1 drop  1 drop Both Eyes q morning Elgergawy, Silver Huguenin, MD   1 drop at 01/18/22 9295     Discharge Medications: Please see discharge summary for a list of discharge medications.  Relevant Imaging Results:  Relevant Lab Results:   Additional Information SSN: [id number]  Coralee Pesa, LCSWA

## 2022-01-18 NOTE — Progress Notes (Signed)
Mobility Specialist: Progress Note   01/18/22 1007  Mobility  Activity Ambulated with assistance in hallway  Level of Assistance Standby assist, set-up cues, supervision of patient - no hands on  Assistive Device Front wheel walker  Distance Ambulated (ft) 300 ft  Activity Response Tolerated well  Mobility Referral Yes  $Mobility charge 1 Mobility   Pt received in the bed and agreeable to mobility. C/o mouth pain, RN present in the room giving pain medication. No c/o dizziness or SOB throughout. Pt to the chair after session with call bell in her lap. Chair alarm is on.   [place] [employer] Mobility Specialist Secure Chat Only

## 2022-01-18 NOTE — TOC Progression Note (Addendum)
Transition of Care [facility]) - Progression Note    Patient Details  Name: Crystal Huber MRN: [id number] Date of Birth: 04-Oct-1945  Transition of Care [facility]) CM/SW [place], [place] Phone Number: 01/18/2022, 2:37 PM  Clinical Narrative:    CSW was advised that pt had decided to got to rehab and would like to speak with CSW. CSW also received a phone call from pt's niece and POA. CSW spoke with pt and confirmed her interest and that she would like to return to [facility]. Process was described in detail to ensure understanding and consent. Pt stated CSW can update Tammy, CSW provided information to [place] as well. CSW sent out referral's.   CSW was notified a pt advocate from [facility] was here and requested to speak with CSW. Advocate, Dyann Ruddle [phone]), states her firm has been hired by the pt to help with her healthcare and advanced care planning needs, and requested an update on pt's discharge plan. CSW met with pt and advocate in room and gained consent to share information, and supplied a consent form from Advocate, will be placed in chart. Discharge plan explained and questions answered. Pt requests CSW continue updating Advocate, as well as herself and Tammy POA.   CSW stated pt may be ready for discharge over the weekend and pt will work on having someone to give her a ride. [facility] is able to offer a bed tomorrow, authorization started. MD notified. TOC will continue to follow for DC needs.  Authorization pending  If pt is able to discharge tomorrow call to report is [phone], Rm# [id number].  Expected Discharge Plan: Home/Self Care Barriers to Discharge: Continued Medical Work up  Expected Discharge Plan and Services Expected Discharge Plan: Home/Self Care     Post Acute Care Choice: Resumption of Svcs/PTA Provider Living arrangements for the past 2 months: Single Family Home                                       Social Determinants of Health (SDOH)  Interventions    Readmission Risk Interventions     No data to display

## 2022-01-19 DIAGNOSIS — R4701 Aphasia: Secondary | ICD-10-CM

## 2022-01-19 DIAGNOSIS — G500 Trigeminal neuralgia: Secondary | ICD-10-CM

## 2022-01-19 LAB — BASIC METABOLIC PANEL
Anion gap: 9 (ref 5–15)
BUN: 12 mg/dL (ref 8–23)
CO2: 26 mmol/L (ref 22–32)
Calcium: 9.1 mg/dL (ref 8.9–10.3)
Chloride: 102 mmol/L (ref 98–111)
Creatinine, Ser: 0.72 mg/dL (ref 0.44–1.00)
GFR, Estimated: >60 mL/min (ref >60)
Glucose, Bld: 85 mg/dL (ref 70–99)
Potassium: 4.2 mmol/L (ref 3.5–5.1)
Sodium: 137 mmol/L (ref 135–145)

## 2022-01-19 LAB — PHOSPHORUS: Phosphorus: 3.4 mg/dL (ref 2.5–4.6)

## 2022-01-19 MED ORDER — ACETAMINOPHEN 650 MG RE SUPP: 650.0000 mg | RECTAL | Status: DC | PRN

## 2022-01-19 MED ORDER — GABAPENTIN 300 MG PO CAPS
300.0000 mg | ORAL_CAPSULE | Freq: Three times a day (TID) | ORAL | Status: DC
  Administered 2022-01-19 – 2022-01-21 (×7): 600 mg via ORAL
  Filled 2022-01-19 (×7): qty 2

## 2022-01-19 MED ORDER — DIPHENHYDRAMINE HCL 25 MG PO CAPS
25.0000 mg | ORAL_CAPSULE | Freq: Four times a day (QID) | ORAL | Status: DC | PRN
  Administered 2022-01-19 – 2022-01-20 (×3): 25 mg via ORAL
  Filled 2022-01-19 (×3): qty 1

## 2022-01-19 MED ORDER — LAMOTRIGINE 25 MG PO TABS
150.0000 mg | ORAL_TABLET | Freq: Three times a day (TID) | ORAL | Status: DC
  Administered 2022-01-19 – 2022-01-21 (×7): 150 mg via ORAL
  Filled 2022-01-19 (×7): qty 2

## 2022-01-19 MED ORDER — ACETAMINOPHEN 325 MG PO TABS: 650.0000 mg | ORAL_TABLET | ORAL | Status: DC | PRN

## 2022-01-19 MED ORDER — ACETAMINOPHEN 160 MG/5ML PO SOLN
650.0000 mg | ORAL | Status: DC | PRN
  Administered 2022-01-19 – 2022-01-20 (×2): 650 mg via ORAL
  Filled 2022-01-19 (×2): qty 20.3

## 2022-01-19 NOTE — Progress Notes (Signed)
PROGRESS NOTE    Crystal Huber  HDQ:[id number] DOB: [birth year] DOA: 01/15/2022 PCP: [name], NP   Chief Complaint  Patient presents with   Code Stroke    Brief Narrative:   Crystal Huber is a 76 y.o. female with medical history significant for relapsing remitting multiple sclerosis, trigeminal neuralgia, HTN, HLD, right breast cancer, GAD, alcohol use who presented to the ED for evaluation of new onset dysphasia.  Concerns were for acute ischemic event versus MS flare, MRI brain was obtained, I have discussed finding on MRI brain, old chronic findings, no concern for any ischemic events or MS flare, but patient was found to be profoundly orthostatic.   Assessment & Plan:   Principal Problem:   Dysphasia Active Problems:   Multiple sclerosis ([place])   Primary hypertension   Trigeminal neuralgia   Anxiety disorder   Alcohol use   Aphasia   Aphasia Generalized weakness Fall - Presenting with mild expressive aphasia after a fall at home. -CT head negative for acute changes -CTA head/neck negative for intracranial LVO or significant stenosis - EEG diffuse encephalopathy, no evidence of epileptiform activity -MRI brain with no acute intracranial abnormality, but significant for scattered L2/FLAIR involving the supratentorial cerebral white matter, I have discussed these findings with neurology on-call Dr. Cheral Marker, he did review imaging, there is no concern for any acute ischemia or MS flare, her presentation most likely related to orthostasis. . -Findings most likely elated to orthostasis, from multiple antihypertensive medications as well from food intake, this has improved with IV fluids . -PT/OT/SLP consulted, recommendation for SNF placement -as well he aphasia most likely related to her trigeminal neuralgia as when it exacerbates it becomes difficult for her to talk.   Multiple sclerosis ([place]) Relapsing remitting MS not on active treatment.  she Follows with neurology,  [name].   Primary hypertension Orthostasis -Holding her antihypertensive medication given she is orthostatic-was orthostatic initially, sitting up 112/55, standing was 65/45, continue to hold meds.  He did appear to be clinically dehydrated on presentation, this has significantly improved with IV fluids. -Her blood pressure started to increase, will resume back on Toprol-XL for now, and will continue to monitor, if needed can resume her back on her home meds including hydralazine and losartan if remains elevated. -We will add as needed hydralazine   Trigeminal neuralgia She has been on chronic Lamictal and gabapentin for management.  For some reason Lamictal was discontinued from her med list yesterday 10/30 with discontinue reason being completed course otherwise no documentation. -Continue chronic Lamictal 150 mg TID -Continue home gabapentin -Follow-up with neurology on 11/20 as scheduled   Alcohol use Per documentation, patient's roommate reported to EMS about heavy alcohol use.  -Patient reports she quit drinking 3 months ago.    Anxiety disorder Continue Celexa.  Trigeminal neuralgia -Continue with home gabapentin    DVT prophylaxis: lovenox Code Status: (Full) Family Communication: none at bedside Disposition: Need SNF placement, she can be discharged when SNF bed is available  Status is: Inpatient    Consultants:  neurology  Subjective:  Again complaining of left facial pain from her trigeminal neuralgia Objective: Vitals:   01/19/22 0035 01/19/22 0411 01/19/22 0914 01/19/22 1203  BP: (!) 120/101 (!) 155/74 (!) 174/83 (!) 158/82  Pulse: (!) 58 (!) 57 (!) 57 (!) 49  Resp: '13 16  20  '$ Temp: 98.1 F (36.7 C) 98.2 F (36.8 C) 97.6 F (36.4 C) 98.6 F (37 C)  TempSrc: Axillary Axillary  Oral Oral  SpO2: 100% 98% 97% 96%  Weight:      Height:        Intake/Output Summary (Last 24 hours) at 01/19/2022 1237 Last data filed at 01/19/2022 1200 Gross per 24 hour   Intake 1248.24 ml  Output --  Net 1248.24 ml   Filed Weights   01/15/22 1600 01/15/22 1744  Weight: 58.8 kg 56.7 kg    Examination:   Awake Alert, Oriented X 3, No new F.N deficits, Normal affect Symmetrical Chest wall movement, Good air movement bilaterally, CTAB RRR,No Gallops,Rubs or new Murmurs, No Parasternal Heave +ve B.Sounds, Abd Soft, No tenderness, No rebound - guarding or rigidity. No Cyanosis, Clubbing or edema, No new Rash or bruise        Data Reviewed: I have personally reviewed following labs and imaging studies  CBC: Recent Labs  Lab 01/15/22 1640 01/15/22 1644 01/16/22 0514 01/17/22 0351  WBC 4.8  --  7.0 5.0  NEUTROABS 2.9  --   --   --   HGB 12.7 13.3 12.3 11.9*  HCT 37.4 39.0 37.5 34.8*  MCV 93.3  --  95.4 92.6  PLT 205  --  201 916    Basic Metabolic Panel: Recent Labs  Lab 01/15/22 1640 01/15/22 1644 01/16/22 0514 01/17/22 0351 01/19/22 0229  NA 134* 133* 135 134* 137  K 3.7 3.7 4.0 3.5 4.2  CL 99 99 102 100 102  CO2 24  --  '24 26 26  '$ GLUCOSE 122* 122* 113* 88 85  BUN '12 12 9 10 12  '$ CREATININE 0.92 0.80 0.86 0.75 0.72  CALCIUM 9.4  --  9.1 8.8* 9.1  PHOS  --   --   --  3.1 3.4    GFR: Estimated Creatinine Clearance: 49.5 mL/min (by C-G formula based on SCr of 0.72 mg/dL).  Liver Function Tests: Recent Labs  Lab 01/15/22 1640  AST 22  ALT 14  ALKPHOS 91  BILITOT 0.4  PROT 6.6  ALBUMIN 3.9    CBG: Recent Labs  Lab 01/15/22 1640 01/18/22 2136  GLUCAP 122* 95     No results found for this or any previous visit (from the past 240 hour(s)).       Radiology Studies: No results found.      Scheduled Meds:  aspirin EC  81 mg Oral Daily   citalopram  20 mg Oral Daily   enoxaparin (LOVENOX) injection  40 mg Subcutaneous Q24H   gabapentin  300-600 mg Oral TID   lamoTRIgine  150 mg Oral TID   latanoprost  1 drop Both Eyes QHS   meclizine  25 mg Oral Daily   metoprolol succinate  50 mg Oral Daily    timolol  1 drop Both Eyes q morning   Continuous Infusions:  lactated ringers 50 mL/hr at 01/19/22 0040   thiamine (VITAMIN B1) injection 500 mg (01/19/22 0835)     LOS: 1 day       Phillips Climes, MD [facility]   To contact the attending provider between 7A-7P or the covering provider during after hours 7P-7A, please log into the web site [email or web address] and access using universal [place] password for that web site. If you do not have the password, please call the hospital operator.  01/19/2022, 12:37 PM

## 2022-01-19 NOTE — Progress Notes (Signed)
Mobility Specialist Progress Note:   01/19/22 0955  Mobility  Activity Ambulated with assistance in hallway  Level of Assistance Standby assist, set-up cues, supervision of patient - no hands on  Assistive Device Front wheel walker  Distance Ambulated (ft) 200 ft  Activity Response Tolerated well  Mobility Referral Yes  $Mobility charge 1 Mobility   Pt received in bed and eager! No complaints. Pt left in bed with all needs met, call bell in reach, and bed alarm on.   Audri Kozub Mobility Specialist-Acute Rehab Secure Chat only

## 2022-01-19 NOTE — TOC Progression Note (Signed)
Transition of Care [facility]) - Progression Note    Patient Details  Name: Crystal Huber MRN: [id number] Date of Birth: 07-27-1945  Transition of Care [facility]) CM/SW [place], [place] Phone Number: 01/19/2022, 3:54 PM  Clinical Narrative:    CSW continued to monitor pt's insurance authorization, it is still pending, pt is not able to discharge without it. Medical team, pt, and pt advocate updated to barrier to discharge. TOC will continue to follow for DC needs.   Expected Discharge Plan: Home/Self Care Barriers to Discharge: Continued Medical Work up  Expected Discharge Plan and Services Expected Discharge Plan: Home/Self Care     Post Acute Care Choice: Resumption of Svcs/PTA Provider Living arrangements for the past 2 months: Single Family Home                                       Social Determinants of Health (SDOH) Interventions    Readmission Risk Interventions     No data to display

## 2022-01-20 DIAGNOSIS — I951 Orthostatic hypotension: Secondary | ICD-10-CM | POA: Diagnosis not present

## 2022-01-20 MED ORDER — LOSARTAN POTASSIUM 50 MG PO TABS
50.0000 mg | ORAL_TABLET | Freq: Every day | ORAL | Status: DC
  Administered 2022-01-20 – 2022-01-21 (×2): 50 mg via ORAL
  Filled 2022-01-20 (×2): qty 1

## 2022-01-20 MED ORDER — HYDRALAZINE HCL 20 MG/ML IJ SOLN: 5.0000 mg | Freq: Four times a day (QID) | INTRAMUSCULAR | Status: DC | PRN

## 2022-01-20 MED ORDER — HYDRALAZINE HCL 50 MG PO TABS
50.0000 mg | ORAL_TABLET | Freq: Two times a day (BID) | ORAL | Status: DC
  Administered 2022-01-20 – 2022-01-21 (×3): 50 mg via ORAL
  Filled 2022-01-20 (×3): qty 1

## 2022-01-20 NOTE — Progress Notes (Signed)
PROGRESS NOTE    [name]  TDD:[id number] DOB: 23-Sep-1945 DOA: 01/15/2022 PCP: [name], NP   Chief Complaint  Patient presents with   Code Stroke    Brief Narrative:   Crystal Huber is a 76 y.o. female with medical history significant for relapsing remitting multiple sclerosis, trigeminal neuralgia, HTN, HLD, right breast cancer, GAD, alcohol use who presented to the ED for evaluation of new onset dysphasia.  Concerns were for acute ischemic event versus MS flare, MRI brain was obtained, I have discussed finding on MRI brain, old chronic findings, no concern for any ischemic events or MS flare, but patient was found to be profoundly orthostatic.   Assessment & Plan:   Principal Problem:   Dysphasia Active Problems:   Multiple sclerosis ([place])   Primary hypertension   Trigeminal neuralgia   Anxiety disorder   Alcohol use   Aphasia   Aphasia Generalized weakness Fall - Presenting with mild expressive aphasia after a fall at home. -CT head negative for acute changes -CTA head/neck negative for intracranial LVO or significant stenosis - EEG diffuse encephalopathy, no evidence of epileptiform activity -MRI brain with no acute intracranial abnormality, but significant for scattered L2/FLAIR involving the supratentorial cerebral white matter, I have discussed these findings with neurology on-call Dr. Cheral Marker, he did review imaging, there is no concern for any acute ischemia or MS flare, her presentation most likely related to orthostasis. . -Findings most likely elated to orthostasis, from multiple antihypertensive medications as well from food intake, this has improved with IV fluids . -PT/OT/SLP consulted, recommendation for SNF placement -as well he aphasia most likely related to her trigeminal neuralgia as when it exacerbates it becomes difficult for her to talk.   Multiple sclerosis ([place]) Relapsing remitting MS not on active treatment.  she Follows with neurology,  [name].   Primary hypertension Orthostasis -Holding her antihypertensive medication given she is orthostatic-was orthostatic initially, sitting up 112/55, standing was 65/45, continue to hold meds.  He did appear to be clinically dehydrated on presentation, this has significantly improved with IV fluids. -Her blood pressure started to increase, will resume back on Toprol-XL for now, and will continue to monitor, if needed can resume her back on her home meds including hydralazine and losartan if remains elevated. -We will add as needed hydralazine   Trigeminal neuralgia She has been on chronic Lamictal and gabapentin for management.  For some reason Lamictal was discontinued from her med list yesterday 10/30 with discontinue reason being completed course otherwise no documentation. -Continue chronic Lamictal 150 mg TID -Continue home gabapentin -Follow-up with neurology on 11/20 as scheduled -Patient reports of neuralgia pain, this has resolved after given her gabapentin and Lamictal dose in early morning.   Alcohol use Per documentation, patient's roommate reported to EMS about heavy alcohol use.  -Patient reports she quit drinking 3 months ago.    Anxiety disorder Continue Celexa.  Trigeminal neuralgia -Continue with home gabapentin    DVT prophylaxis: lovenox Code Status: (Full) Family Communication: none at bedside Disposition: Need SNF placement, she can be discharged when SNF bed is available  Status is: Inpatient    Consultants:  neurology  Subjective:  She denies any complaints today, no further trigeminal neuralgia pain since her meds timing has been adjusted Objective: Vitals:   01/19/22 2321 01/20/22 0346 01/20/22 0757 01/20/22 1026  BP: (!) 155/80 (!) 169/66 (!) 148/68 (!) 164/79  Pulse: (!) 51 (!) 57 (!) 52 (!) 57  Resp: 17  20 20   Temp: 98 F (36.7 C) 98.2 F (36.8 C) (!) 97.5 F (36.4 C) 98 F (36.7 C)  TempSrc: Oral Oral Oral Oral  SpO2: 94% 94%  100% 97%  Weight:      Height:        Intake/Output Summary (Last 24 hours) at 01/20/2022 1207 Last data filed at 01/20/2022 0700 Gross per 24 hour  Intake 1020.63 ml  Output --  Net 1020.63 ml   Filed Weights   01/15/22 1600 01/15/22 1744  Weight: 58.8 kg 56.7 kg    Examination:   Awake Alert, Oriented X 3, No new F.N deficits, Normal affect Symmetrical Chest wall movement, Good air movement bilaterally, CTAB RRR,No Gallops,Rubs or new Murmurs, No Parasternal Heave +ve B.Sounds, Abd Soft, No tenderness, No rebound - guarding or rigidity. No Cyanosis, Clubbing or edema, No new Rash or bruise         Data Reviewed: I have personally reviewed following labs and imaging studies  CBC: Recent Labs  Lab 01/15/22 1640 01/15/22 1644 01/16/22 0514 01/17/22 0351  WBC 4.8  --  7.0 5.0  NEUTROABS 2.9  --   --   --   HGB 12.7 13.3 12.3 11.9*  HCT 37.4 39.0 37.5 34.8*  MCV 93.3  --  95.4 92.6  PLT 205  --  201 244    Basic Metabolic Panel: Recent Labs  Lab 01/15/22 1640 01/15/22 1644 01/16/22 0514 01/17/22 0351 01/19/22 0229  NA 134* 133* 135 134* 137  K 3.7 3.7 4.0 3.5 4.2  CL 99 99 102 100 102  CO2 24  --  '24 26 26  '$ GLUCOSE 122* 122* 113* 88 85  BUN '12 12 9 10 12  '$ CREATININE 0.92 0.80 0.86 0.75 0.72  CALCIUM 9.4  --  9.1 8.8* 9.1  PHOS  --   --   --  3.1 3.4    GFR: Estimated Creatinine Clearance: 49.5 mL/min (by C-G formula based on SCr of 0.72 mg/dL).  Liver Function Tests: Recent Labs  Lab 01/15/22 1640  AST 22  ALT 14  ALKPHOS 91  BILITOT 0.4  PROT 6.6  ALBUMIN 3.9    CBG: Recent Labs  Lab 01/15/22 1640 01/18/22 2136  GLUCAP 122* 95     No results found for this or any previous visit (from the past 240 hour(s)).       Radiology Studies: No results found.      Scheduled Meds:  aspirin EC  81 mg Oral Daily   citalopram  20 mg Oral Daily   enoxaparin (LOVENOX) injection  40 mg Subcutaneous Q24H   gabapentin  300-600 mg  Oral TID   hydrALAZINE  50 mg Oral BID   lamoTRIgine  150 mg Oral TID   latanoprost  1 drop Both Eyes QHS   losartan  50 mg Oral Daily   meclizine  25 mg Oral Daily   metoprolol succinate  50 mg Oral Daily   timolol  1 drop Both Eyes q morning   Continuous Infusions:  lactated ringers 50 mL/hr at 01/19/22 2241   thiamine (VITAMIN B1) injection 500 mg (01/20/22 1022)     LOS: 2 days       Phillips Climes, MD [facility]   To contact the attending provider between 7A-7P or the covering provider during after hours 7P-7A, please log into the web site [email or web address] and access using universal [place] password for that web site. If you do not have the password,  please call the hospital operator.  01/20/2022, 12:07 PM

## 2022-01-20 NOTE — Progress Notes (Signed)
Mobility Specialist Progress Note:   01/20/22 0902  Mobility  Activity Ambulated with assistance in hallway  Level of Assistance Standby assist, set-up cues, supervision of patient - no hands on  Assistive Device Front wheel walker  Distance Ambulated (ft) 350 ft  Activity Response Tolerated well  Mobility Referral Yes  $Mobility charge 1 Mobility   Pt received in bed and agreeable. No complaints. Pt left in bed with all needs met and call bell in reach.   Akila Batta Mobility Specialist-Acute Rehab Secure Chat only

## 2022-01-20 NOTE — Plan of Care (Signed)
  Problem: Coping: Goal: Will verbalize positive feelings about self Outcome: Progressing   Problem: Self-Care: Goal: Ability to participate in self-care as condition permits will improve Outcome: Progressing Goal: Ability to communicate needs accurately will improve Outcome: Progressing   Problem: Clinical Measurements: Goal: Will remain free from infection Outcome: Progressing Goal: Respiratory complications will improve Outcome: Progressing   Problem: Elimination: Goal: Will not experience complications related to bowel motility Outcome: Progressing Goal: Will not experience complications related to urinary retention Outcome: Progressing   Problem: Safety: Goal: Ability to remain free from injury will improve Outcome: Progressing

## 2022-01-21 DIAGNOSIS — R269 Unspecified abnormalities of gait and mobility: Secondary | ICD-10-CM | POA: Diagnosis not present

## 2022-01-21 DIAGNOSIS — Z9011 Acquired absence of right breast and nipple: Secondary | ICD-10-CM | POA: Diagnosis not present

## 2022-01-21 DIAGNOSIS — R4702 Dysphasia: Secondary | ICD-10-CM | POA: Diagnosis not present

## 2022-01-21 DIAGNOSIS — Z882 Allergy status to sulfonamides status: Secondary | ICD-10-CM | POA: Diagnosis not present

## 2022-01-21 DIAGNOSIS — R4781 Slurred speech: Secondary | ICD-10-CM | POA: Diagnosis not present

## 2022-01-21 DIAGNOSIS — N179 Acute kidney failure, unspecified: Secondary | ICD-10-CM | POA: Diagnosis not present

## 2022-01-21 DIAGNOSIS — B370 Candidal stomatitis: Secondary | ICD-10-CM | POA: Diagnosis not present

## 2022-01-21 DIAGNOSIS — R296 Repeated falls: Secondary | ICD-10-CM | POA: Diagnosis not present

## 2022-01-21 DIAGNOSIS — Z880 Allergy status to penicillin: Secondary | ICD-10-CM | POA: Diagnosis not present

## 2022-01-21 DIAGNOSIS — G35 Multiple sclerosis: Secondary | ICD-10-CM | POA: Diagnosis not present

## 2022-01-21 DIAGNOSIS — I951 Orthostatic hypotension: Secondary | ICD-10-CM | POA: Diagnosis not present

## 2022-01-21 DIAGNOSIS — R42 Dizziness and giddiness: Secondary | ICD-10-CM | POA: Diagnosis not present

## 2022-01-21 DIAGNOSIS — R4701 Aphasia: Secondary | ICD-10-CM | POA: Diagnosis not present

## 2022-01-21 DIAGNOSIS — Z79899 Other long term (current) drug therapy: Secondary | ICD-10-CM | POA: Diagnosis not present

## 2022-01-21 DIAGNOSIS — S2239XA Fracture of one rib, unspecified side, initial encounter for closed fracture: Secondary | ICD-10-CM | POA: Diagnosis not present

## 2022-01-21 DIAGNOSIS — R261 Paralytic gait: Secondary | ICD-10-CM | POA: Diagnosis not present

## 2022-01-21 DIAGNOSIS — M6281 Muscle weakness (generalized): Secondary | ICD-10-CM | POA: Diagnosis not present

## 2022-01-21 DIAGNOSIS — G500 Trigeminal neuralgia: Secondary | ICD-10-CM | POA: Diagnosis not present

## 2022-01-21 DIAGNOSIS — I10 Essential (primary) hypertension: Secondary | ICD-10-CM | POA: Diagnosis not present

## 2022-01-21 DIAGNOSIS — Z881 Allergy status to other antibiotic agents status: Secondary | ICD-10-CM | POA: Diagnosis not present

## 2022-01-21 DIAGNOSIS — Z888 Allergy status to other drugs, medicaments and biological substances status: Secondary | ICD-10-CM | POA: Diagnosis not present

## 2022-01-21 MED ORDER — METOPROLOL SUCCINATE ER 50 MG PO TB24: 50.0000 mg | ORAL_TABLET | Freq: Every day | ORAL | 1 refills | Status: DC

## 2022-01-21 MED ORDER — LOSARTAN POTASSIUM 25 MG PO TABS: 25.0000 mg | ORAL_TABLET | Freq: Every day | ORAL | Status: DC

## 2022-01-21 MED ORDER — LAMOTRIGINE 150 MG PO TABS: 150.0000 mg | ORAL_TABLET | Freq: Three times a day (TID) | ORAL | Status: DC

## 2022-01-21 MED ORDER — METOPROLOL SUCCINATE ER 25 MG PO TB24: 25.0000 mg | ORAL_TABLET | Freq: Every day | ORAL | 0 refills | Status: DC

## 2022-01-21 MED ORDER — ASPIRIN 81 MG PO TBEC: 81.0000 mg | DELAYED_RELEASE_TABLET | Freq: Every day | ORAL | 12 refills | Status: AC

## 2022-01-21 NOTE — Discharge Instructions (Signed)
Follow with Primary MD [name], NP in 7 days   Get CBC, CMP, checked  by Primary MD next visit.    Activity: As tolerated with Full fall precautions use walker/cane & assistance as needed   Disposition SNF   Diet: DYS 2 with thin liquid( due to tooth issue, advance if tolerates)   On your next visit with your primary care physician please Get Medicines reviewed and adjusted.   Please request your Prim.MD to go over all Hospital Tests and Procedure/Radiological results at the follow up, please get all Hospital records sent to your Prim MD by signing hospital release before you go home.   If you experience worsening of your admission symptoms, develop shortness of breath, life threatening emergency, suicidal or homicidal thoughts you must seek medical attention immediately by calling 911 or calling your MD immediately  if symptoms less severe.  You Must read complete instructions/literature along with all the possible adverse reactions/side effects for all the Medicines you take and that have been prescribed to you. Take any new Medicines after you have completely understood and accpet all the possible adverse reactions/side effects.   Do not drive, operating heavy machinery, perform activities at heights, swimming or participation in water activities or provide baby sitting services if your were admitted for syncope or siezures until you have seen by Primary MD or a Neurologist and advised to do so again.  Do not drive when taking Pain medications.    Do not take more than prescribed Pain, Sleep and Anxiety Medications  Special Instructions: If you have smoked or chewed Tobacco  in the last 2 yrs please stop smoking, stop any regular Alcohol  and or any Recreational drug use.  Wear Seat belts while driving.   Please note  You were cared for by a hospitalist during your hospital stay. If you have any questions about your discharge medications or the care you received  while you were in the hospital after you are discharged, you can call the unit and asked to speak with the hospitalist on call if the hospitalist that took care of you is not available. Once you are discharged, your primary care physician will handle any further medical issues. Please note that NO REFILLS for any discharge medications will be authorized once you are discharged, as it is imperative that you return to your primary care physician (or establish a relationship with a primary care physician if you do not have one) for your aftercare needs so that they can reassess your need for medications and monitor your lab values.

## 2022-01-21 NOTE — Progress Notes (Signed)
AVS given and reviewed with pt and niece. Report called and given to [place], LPN at [facility]. All questions answered to satisfaction. [facility] at bedside to transport pt with all belongings.

## 2022-01-21 NOTE — Care Management Important Message (Signed)
Important Message  Patient Details  Name: Crystal Huber MRN: [id number] Date of Birth: [birth year]   Medicare Important Message Given:  Yes     Rollie Hynek [address] 01/21/2022, 4:04 PM

## 2022-01-21 NOTE — TOC Transition Note (Signed)
Transition of Care [facility]) - CM/SW Discharge Note   Patient Details  Name: Crystal Huber MRN: [id number] Date of Birth: 07-30-1945  [facility]) CM/SW Contact:  Coralee Pesa, [place] Phone Number: 01/21/2022, 12:21 PM   Clinical Narrative:     Pt to be transported to [facility] via [facility]. Nurse to call report to [phone] Rm # [phone]  Final next level of care: Skilled Nursing Facility Barriers to Discharge: Barriers Resolved   Patient Goals and CMS Choice Patient states their goals for this hospitalization and ongoing recovery are:: Pt wants to return home at discharge. CMS Medicare.gov Compare Post Acute Care list provided to:: Patient Choice offered to / list presented to : Patient  Discharge Placement              Patient chooses bed at: [facility] Patient to be transferred to facility by: [facility]/ HCPOA Name of family member notified: Tammy Patient and family notified of of transfer: 01/21/22  Discharge Plan and Services     Post Acute Care Choice: Resumption of Svcs/PTA Provider                               Social Determinants of Health (SDOH) Interventions     Readmission Risk Interventions     No data to display

## 2022-01-21 NOTE — Plan of Care (Signed)
  Problem: Pain Managment: Goal: General experience of comfort will improve Outcome: Progressing   Problem: Safety: Goal: Ability to remain free from injury will improve Outcome: Progressing   Problem: Skin Integrity: Goal: Risk for impaired skin integrity will decrease Outcome: Progressing   

## 2022-01-21 NOTE — Discharge Summary (Signed)
Physician Discharge Summary  Crystal Huber WSF:[id number] DOB: 10/07/1945 DOA: 01/15/2022  PCP: [name], NP  Admit date: 01/15/2022 Discharge date: 01/21/2022  Admitted From: (Home) Disposition:  SNF  Recommendations for Outpatient Follow-up:  She is on dysphagia 2 diet for loose tooth, this can be advanced if she tolerates Please check CBC, BMP in 3 days   Discharge Condition: (Stable) CODE STATUS: (FULL) Diet recommendation: She is on dysphagia 2 diet for loose tooth, this can be advanced if she tolerates  Brief/Interim Summary:  Crystal Huber is a 76 y.o. female with medical history significant for relapsing remitting multiple sclerosis, trigeminal neuralgia, HTN, HLD, right breast cancer, GAD, alcohol use who presented to the ED for evaluation of new onset aphasia.  Concerns were for acute ischemic event versus MS flare, MRI brain was obtained, I have discussed finding on MRI brain, old chronic findings, no concern for any ischemic events or MS flare, but patient was found to be profoundly orthostatic.  She was appropriately hydrated and her blood pressure meds were titrated,  Aphasia Generalized weakness Fall (CVA/TIA ruled out) - Presenting with mild expressive aphasia after a fall at home. -CT head negative for acute changes -CTA head/neck negative for intracranial LVO or significant stenosis - EEG diffuse encephalopathy, no evidence of epileptiform activity -MRI brain with no acute intracranial abnormality, but significant for scattered L2/FLAIR involving the supratentorial cerebral white matter, I have discussed these findings with neurology on-call Dr. Cheral Marker, he did review imaging, there is no concern for any acute ischemia or MS flare, her presentation most likely related to orthostasis. . -Findings most likely elated to orthostasis, from multiple antihypertensive medications as well from food intake, this has improved with IV fluids . -PT/OT/SLP consulted,  recommendation for SNF placement -as well he aphasia most likely related to her trigeminal neuralgia as when it exacerbates it becomes difficult for her to talk.   Multiple sclerosis ([place]) Relapsing remitting MS not on active treatment.  she Follows with neurology, [name].   Primary hypertension Orthostasis -on admission holding her antihypertensive medication given she is orthostatic-was orthostatic initially, sitting up 112/55, standing was 65/45, so her antihypertensive medications has been held, she was treated with IV fluids, orthostasis has resolved, blood pressure started to increase, home regimen has been resumed, she was mildly bradycardic on home dose Toprol-XL, so it was decreased on discharge from 50 to 25 mg oral daily.    Trigeminal neuralgia She has been on chronic Lamictal and gabapentin for management. -Continue chronic Lamictal 150 mg TID -Continue home gabapentin -Follow-up with neurology on 11/20 as scheduled -Patient reports of neuralgia pain, this has resolved after given her gabapentin and Lamictal dose in early morning.   Alcohol use Per documentation, patient's roommate reported to EMS about heavy alcohol use.  -Patient reports she quit drinking 3 months ago.  No evidence of withdrawals, no alcohol use for last 3 months.   Anxiety disorder Continue Celexa.    Discharge Diagnoses:  Active Problems:   Multiple sclerosis ([place])   Primary hypertension   Trigeminal neuralgia   Anxiety disorder   Alcohol use   Aphasia    Discharge Instructions  Discharge Instructions     Discharge instructions   Complete by: As directed    Follow with Primary MD [name], NP in 7 days   Get CBC, CMP, checked  by Primary MD next visit.    Activity: As tolerated with Full fall precautions use walker/cane & assistance as  needed   Disposition SNF   Diet: DYS 2 with thin liquid( due to tooth issue, advance if tolerates)   On your next visit with your  primary care physician please Get Medicines reviewed and adjusted.   Please request your Prim.MD to go over all Hospital Tests and Procedure/Radiological results at the follow up, please get all Hospital records sent to your Prim MD by signing hospital release before you go home.   If you experience worsening of your admission symptoms, develop shortness of breath, life threatening emergency, suicidal or homicidal thoughts you must seek medical attention immediately by calling 911 or calling your MD immediately  if symptoms less severe.  You Must read complete instructions/literature along with all the possible adverse reactions/side effects for all the Medicines you take and that have been prescribed to you. Take any new Medicines after you have completely understood and accpet all the possible adverse reactions/side effects.   Do not drive, operating heavy machinery, perform activities at heights, swimming or participation in water activities or provide baby sitting services if your were admitted for syncope or siezures until you have seen by Primary MD or a Neurologist and advised to do so again.  Do not drive when taking Pain medications.    Do not take more than prescribed Pain, Sleep and Anxiety Medications  Special Instructions: If you have smoked or chewed Tobacco  in the last 2 yrs please stop smoking, stop any regular Alcohol  and or any Recreational drug use.  Wear Seat belts while driving.   Please note  You were cared for by a hospitalist during your hospital stay. If you have any questions about your discharge medications or the care you received while you were in the hospital after you are discharged, you can call the unit and asked to speak with the hospitalist on call if the hospitalist that took care of you is not available. Once you are discharged, your primary care physician will handle any further medical issues. Please note that NO REFILLS for any discharge medications  will be authorized once you are discharged, as it is imperative that you return to your primary care physician (or establish a relationship with a primary care physician if you do not have one) for your aftercare needs so that they can reassess your need for medications and monitor your lab values.   Increase activity slowly   Complete by: As directed       Allergies as of 01/21/2022       Reactions   Carbamazepine    Decreased sodium level   Imipramine    Night sweats, more intense dreams   Metronidazole Nausea Only   Oxcarbazepine    Decreased sodium level   Penicillins Swelling   Yeast infection   Red Blood Cells Other (See Comments)   Jehovah's witness, does not want blood products   Statins    Gets hyponatremia and severe muscle pain   Sulfa Antibiotics Swelling   yeast infection        Medication List     TAKE these medications    aspirin EC 81 MG tablet Take 1 tablet (81 mg total) by mouth daily. Swallow whole. Start taking on: January 22, 2022   citalopram 20 MG tablet Commonly known as: CELEXA Take 1 tablet (20 mg total) by mouth daily. What changed: how much to take   gabapentin 300 MG capsule Commonly known as: NEURONTIN Take 1 or 2 pills p.o. 3 times a day (up  to 5 a day) What changed:  how much to take when to take this additional instructions   HAIR/SKIN/NAILS PO Take 1 tablet by mouth daily.   hydrALAZINE 50 MG tablet Commonly known as: APRESOLINE TAKE ONE TABLET BY MOUTH TWICE DAILY What changed: when to take this   lamoTRIgine 150 MG tablet Commonly known as: LAMICTAL Take 1 tablet (150 mg total) by mouth 3 (three) times daily.   latanoprost 0.005 % ophthalmic solution Commonly known as: XALATAN Place 1 drop into both eyes at bedtime.   losartan 25 MG tablet Commonly known as: Cozaar Take 1 tablet (25 mg total) by mouth daily. What changed:  medication strength how much to take   meclizine 25 MG tablet Commonly known as:  ANTIVERT Take 25 mg by mouth daily.   metoprolol succinate 25 MG 24 hr tablet Commonly known as: Toprol XL Take 1 tablet (25 mg total) by mouth daily. What changed:  medication strength how much to take   multivitamin tablet Take 1 tablet by mouth daily.   timolol 0.5 % ophthalmic solution Commonly known as: TIMOPTIC Place 1 drop into both eyes every morning.        Contact information for after-discharge care     Destination     [facility] .   Service: Skilled Nursing Contact information: [address] [place] [phone]                    Allergies  Allergen Reactions   Carbamazepine     Decreased sodium level   Imipramine     Night sweats, more intense dreams   Metronidazole Nausea Only   Oxcarbazepine     Decreased sodium level   Penicillins Swelling    Yeast infection   Red Blood Cells Other (See Comments)    Jehovah's witness, does not want blood products   Statins     Gets hyponatremia and severe muscle pain   Sulfa Antibiotics Swelling    yeast infection    Consultations: neurology   Procedures/Studies: DG Wrist 2 Views Right  Result Date: 01/16/2022 CLINICAL DATA:  Right wrist swelling EXAM: RIGHT WRIST - 2 VIEW COMPARISON:  None Available. FINDINGS: No evidence of fracture or dislocation. Partially visualized mild IP joint degenerative changes. Diffuse demineralization. Ill-defined sclerotic lesion of the distal fifth metacarpal compatible with a bone island. Swelling of the wrist. IMPRESSION: Swelling of the wrist. No evidence of acute fracture or dislocation. Electronically Signed   By: Yetta Glassman M.D.   On: 01/16/2022 13:30   EEG adult  Result Date: 01/16/2022 Lora Havens, MD     01/16/2022  8:59 AM Patient Name: Crystal Huber MRN: [id number] Epilepsy Attending: Lora Havens Referring Physician/Provider: Lenore Cordia, MD Date: 01/16/2022 Duration: 23.23 mins Patient history:  76yo F with mild expressive aphasia. EEG to evaluate for seizure. Level of alertness: Awake AEDs during EEG study: LTG, GBP Technical aspects: This EEG study was done with scalp electrodes positioned according to the 10-20 International system of electrode placement. Electrical activity was reviewed with band pass filter of 1-'70Hz'$ , sensitivity of 7 uV/mm, display speed of 26m/sec with a '60Hz'$  notched filter applied as appropriate. EEG data were recorded continuously and digitally stored.  Video monitoring was available and reviewed as appropriate. Description: EEG showed continuous generalized 3 to 5 Hz theta-delta slowing. Hyperventilation and photic stimulation were not performed.   ABNORMALITY - Continuous slow, generalized IMPRESSION: This study is suggestive  of moderate to severe diffuse encephalopathy, nonspecific etiology. No seizures or epileptiform discharges were seen throughout the recording. Priyanka Barbra Sarks   CT HEAD WO CONTRAST (5MM)  Result Date: 01/16/2022 CLINICAL DATA:  [place] and hit head. Now with head and neck pain. There is a known history of multiple sclerosis. EXAM: CT HEAD WITHOUT CONTRAST CT CERVICAL SPINE WITHOUT CONTRAST TECHNIQUE: Multidetector CT imaging of the head and cervical spine was performed following the standard protocol without intravenous contrast. Multiplanar CT image reconstructions of the cervical spine were also generated. RADIATION DOSE REDUCTION: This exam was performed according to the departmental dose-optimization program which includes automated exposure control, adjustment of the mA and/or kV according to patient size and/or use of iterative reconstruction technique. COMPARISON:  Head CT 01/15/2022, head CT 09/27/2021, cervical spine CT 09/27/2021. FINDINGS: CT HEAD FINDINGS Brain: There is mild global atrophy with mild atrophic ventriculomegaly. Moderate patchy hypoattenuating cerebral white matter disease is again noted, disproportionately prominent present  again in the right frontal lobe which may relate to prior history of MS. There is a small chronic lacunar infarct laterally in the left cerebellar hemisphere. No new asymmetry is seen concerning for an acute cortical based infarct, hemorrhage, mass or midline shift. Basal cisterns are clear. Vascular: Scattered calcific plaque both carotid siphons, distal left vertebral artery. No hyperdense central vessel. Skull: Negative for fractures or focal lesions. Sinuses/Orbits: No acute finding. Other: None. CT CERVICAL SPINE FINDINGS Alignment: Minimal chronic degenerative anterolisthesis is again noted at C4-5 and C7-T1 No traumatic or further listhesis is seen. Narrowing and spurring of the anterior atlantodental joint is again noted. Slight cervical levoscoliosis. Skull base and vertebrae: There is osteopenia without evidence of fractures or focal bone lesion. A nuchal ligament calcific body is again noted posteriorly at C4-5. Soft tissues and spinal canal: No prevertebral fluid or swelling. No visible canal hematoma. There are mild calcifications in the proximal cervical ICAs. Disc levels: Variable disc degeneration noted at C4-5 through C7-T1, greatest C5-6 and C6-7. The C2-3 and C3-4 discs are normal in height. There is mild cervical spondylosis. No herniated discs or cord compromise are seen. There are facet joint and uncinate spurs at most levels, with moderate foraminal stenosis noted on the right at C5-6 and C6-7. Other foramina appear clear. Upper chest: Negative. Other: None. IMPRESSION: 1. No acute intracranial CT findings or depressed skull fractures. 2. Stable atrophy and white matter disease. 3. Osteopenia and degenerative change without evidence of cervical fractures or traumatic listhesis. 4. Carotid arthrosclerosis. Electronically Signed   By: Telford Nab M.D.   On: 01/16/2022 05:09   CT Cervical Spine Wo Contrast  Result Date: 01/16/2022 CLINICAL DATA:  [place] and hit head. Now with head and neck  pain. There is a known history of multiple sclerosis. EXAM: CT HEAD WITHOUT CONTRAST CT CERVICAL SPINE WITHOUT CONTRAST TECHNIQUE: Multidetector CT imaging of the head and cervical spine was performed following the standard protocol without intravenous contrast. Multiplanar CT image reconstructions of the cervical spine were also generated. RADIATION DOSE REDUCTION: This exam was performed according to the departmental dose-optimization program which includes automated exposure control, adjustment of the mA and/or kV according to patient size and/or use of iterative reconstruction technique. COMPARISON:  Head CT 01/15/2022, head CT 09/27/2021, cervical spine CT 09/27/2021. FINDINGS: CT HEAD FINDINGS Brain: There is mild global atrophy with mild atrophic ventriculomegaly. Moderate patchy hypoattenuating cerebral white matter disease is again noted, disproportionately prominent present again in the right frontal lobe which may relate to  prior history of MS. There is a small chronic lacunar infarct laterally in the left cerebellar hemisphere. No new asymmetry is seen concerning for an acute cortical based infarct, hemorrhage, mass or midline shift. Basal cisterns are clear. Vascular: Scattered calcific plaque both carotid siphons, distal left vertebral artery. No hyperdense central vessel. Skull: Negative for fractures or focal lesions. Sinuses/Orbits: No acute finding. Other: None. CT CERVICAL SPINE FINDINGS Alignment: Minimal chronic degenerative anterolisthesis is again noted at C4-5 and C7-T1 No traumatic or further listhesis is seen. Narrowing and spurring of the anterior atlantodental joint is again noted. Slight cervical levoscoliosis. Skull base and vertebrae: There is osteopenia without evidence of fractures or focal bone lesion. A nuchal ligament calcific body is again noted posteriorly at C4-5. Soft tissues and spinal canal: No prevertebral fluid or swelling. No visible canal hematoma. There are mild  calcifications in the proximal cervical ICAs. Disc levels: Variable disc degeneration noted at C4-5 through C7-T1, greatest C5-6 and C6-7. The C2-3 and C3-4 discs are normal in height. There is mild cervical spondylosis. No herniated discs or cord compromise are seen. There are facet joint and uncinate spurs at most levels, with moderate foraminal stenosis noted on the right at C5-6 and C6-7. Other foramina appear clear. Upper chest: Negative. Other: None. IMPRESSION: 1. No acute intracranial CT findings or depressed skull fractures. 2. Stable atrophy and white matter disease. 3. Osteopenia and degenerative change without evidence of cervical fractures or traumatic listhesis. 4. Carotid arthrosclerosis. Electronically Signed   By: Telford Nab M.D.   On: 01/16/2022 05:09   MR BRAIN WO CONTRAST  Result Date: 01/16/2022 CLINICAL DATA:  Initial evaluation for mental status change, unknown cause. EXAM: MRI HEAD WITHOUT CONTRAST TECHNIQUE: Multiplanar, multiecho pulse sequences of the brain and surrounding structures were obtained without intravenous contrast. COMPARISON:  Prior CTs from earlier the same day. FINDINGS: Brain: Examination moderately to severely degraded by motion artifact. Generalized age-related cerebral atrophy. Scattered patchy and confluent T2/FLAIR hyperintensity seen involving the periventricular and deep white matter both cerebral hemispheres, nonspecific, but presumably in large part related to history of demyelinating disease. A component of chronic microvascular ischemic disease may be contributory as well. No evidence for acute or subacute ischemia. Gray-white matter differentiation maintained. No visible acute or chronic intracranial blood products. No mass lesion, midline shift or mass effect. No hydrocephalus or extra-axial fluid collection. No visible signal changes to suggest active demyelination. Vascular: Major intracranial vascular flow voids are maintained. Skull and upper  cervical spine: Craniocervical junction within normal limits. Bone marrow scan signal intensity grossly normal. No scalp soft tissue abnormality. Sinuses/Orbits: Globes orbital soft tissues within normal limits. Paranasal sinuses are largely clear. No significant mastoid effusion. Other: None. IMPRESSION: 1. Motion degraded exam. 2. No acute intracranial abnormality. 3. Scattered T2/FLAIR hyperintensity involving the supratentorial cerebral white matter, nonspecific, but presumably in large part related to history of demyelinating disease. A component of chronic microvascular ischemic disease could be contributory as well. Electronically Signed   By: Jeannine Boga M.D.   On: 01/16/2022 00:39   CT ANGIO HEAD NECK W WO CM W PERF (CODE STROKE)  Result Date: 01/15/2022 CLINICAL DATA:  Acute neuro deficit.  Speech deficit aphasia. EXAM: CT ANGIOGRAPHY HEAD AND NECK CT PERFUSION HEAD TECHNIQUE: Multidetector CT imaging of the head and neck was performed using the standard protocol during bolus administration of intravenous contrast. Multiplanar CT image reconstructions and MIPs were obtained to evaluate the vascular anatomy. Carotid stenosis measurements (when applicable) are obtained  utilizing NASCET criteria, using the distal internal carotid diameter as the denominator. RADIATION DOSE REDUCTION: This exam was performed according to the departmental dose-optimization program which includes automated exposure control, adjustment of the mA and/or kV according to patient size and/or use of iterative reconstruction technique. CONTRAST:  121m OMNIPAQUE IOHEXOL 350 MG/ML SOLN COMPARISON:  CT head 01/15/2022 FINDINGS: CTA NECK FINDINGS Aortic arch: Standard branching. Imaged portion shows no evidence of aneurysm or dissection. No significant stenosis of the major arch vessel origins. Right carotid system: Mild atherosclerotic calcification right carotid bifurcation. Negative for carotid stenosis. Left carotid  system: Mild atherosclerotic calcification left carotid bifurcation. Negative for stenosis. Vertebral arteries: Both vertebral arteries are patent to the skull base without significant stenosis. Skeleton: Mild degenerative change cervical spine. No acute skeletal abnormality. Other neck: Negative for mass or adenopathy in the neck. Upper chest: Lung apices clear bilaterally. Review of the MIP images confirms the above findings CTA HEAD FINDINGS Anterior circulation: Mild atherosclerotic calcification in the cavernous carotid bilaterally. Negative for stenosis. Anterior and middle cerebral arteries widely patent. No large vessel occlusion or flow limiting stenosis. Negative for aneurysm. Posterior circulation: Both vertebral arteries patent to the basilar. Mild atherosclerotic disease distal left vertebral artery that significant stenosis. Basilar widely patent. Superior cerebellar and posterior cerebral arteries patent. No large vessel occlusion or aneurysm. Venous sinuses: Normal venous enhancement. Hypoplastic right transverse sinus. Anatomic variants: None Review of the MIP images confirms the above findings CT PERFUSION HEAD No area of cerebral infarction. Negative for delayed perfusion or ischemia. IMPRESSION: 1. Negative for intracranial large vessel occlusion or significant stenosis. 2. Mild atherosclerotic disease in the carotid bifurcation bilaterally without stenosis. Both vertebral arteries widely patent. 3. Negative CT perfusion head Electronically Signed   By: CFranchot GalloM.D.   On: 01/15/2022 17:29   CT HEAD CODE STROKE WO CONTRAST  Result Date: 01/15/2022 CLINICAL DATA:  Code stroke.  Neuro deficit, acute, stroke suspected EXAM: CT HEAD WITHOUT CONTRAST TECHNIQUE: Contiguous axial images were obtained from the base of the skull through the vertex without intravenous contrast. RADIATION DOSE REDUCTION: This exam was performed according to the departmental dose-optimization program which  includes automated exposure control, adjustment of the mA and/or kV according to patient size and/or use of iterative reconstruction technique. COMPARISON:  CT head 09/27/2021. FINDINGS: Brain: No evidence of acute large vascular territory infarction, hemorrhage, hydrocephalus, extra-axial collection or mass lesion/mass effect. Similar patchy white matter hypodensities. Remote left cerebellar infarct. Vascular: No hyperdense vessel identified. Calcific atherosclerosis. Skull: No acute fracture. Sinuses/Orbits: Clear sinuses.  No acute orbital findings. Other: No mastoid effusions. ASPECTS ([facility] Early CT Score) total score (0-10 with 10 being normal): 10. IMPRESSION: 1. No evidence of acute large vascular territory infarct or acute hemorrhage. ASPECTS is 10 2. Similar white matter hypodensities, which could be related to chronic microvascular ischemic disease and/or multiple sclerosis given the patient's clinical history. MRI could further characterize if clinically warranted. Electronically Signed   By: FMargaretha SheffieldM.D.   On: 01/15/2022 16:58      Subjective:  No significant events overnight, she denies any complaints today. Discharge Exam: Vitals:   01/21/22 0727 01/21/22 0803  BP: (!) 198/93 (!) 154/62  Pulse: (!) 59 60  Resp: 18   Temp: 98.8 F (37.1 C)   SpO2: 95%    Vitals:   01/20/22 2346 01/21/22 0352 01/21/22 0727 01/21/22 0803  BP: (!) 146/64 (!) 165/81 (!) 198/93 (!) 154/62  Pulse: (!) 53 (!) 55 (Marland Kitchen  59 60  Resp: '18 16 18   '$ Temp: 98.2 F (36.8 C) 98.7 F (37.1 C) 98.8 F (37.1 C)   TempSrc: Oral Oral Oral   SpO2: 97% 93% 95%   Weight:      Height:        General: Pt is alert, awake, not in acute distress Cardiovascular: RRR, S1/S2 +, no rubs, no gallops Respiratory: CTA bilaterally, no wheezing, no rhonchi Abdominal: Soft, NT, ND, bowel sounds + Extremities: no edema, no cyanosis    The results of significant diagnostics from this  hospitalization (including imaging, microbiology, ancillary and laboratory) are listed below for reference.     Microbiology: No results found for this or any previous visit (from the past 240 hour(s)).   Labs: BNP (last 3 results) No results for input(s): "BNP" in the last 8760 hours. Basic Metabolic Panel: Recent Labs  Lab 01/15/22 1640 01/15/22 1644 01/16/22 0514 01/17/22 0351 01/19/22 0229  NA 134* 133* 135 134* 137  K 3.7 3.7 4.0 3.5 4.2  CL 99 99 102 100 102  CO2 24  --  '24 26 26  '$ GLUCOSE 122* 122* 113* 88 85  BUN '12 12 9 10 12  '$ CREATININE 0.92 0.80 0.86 0.75 0.72  CALCIUM 9.4  --  9.1 8.8* 9.1  PHOS  --   --   --  3.1 3.4   Liver Function Tests: Recent Labs  Lab 01/15/22 1640  AST 22  ALT 14  ALKPHOS 91  BILITOT 0.4  PROT 6.6  ALBUMIN 3.9   No results for input(s): "LIPASE", "AMYLASE" in the last 168 hours. No results for input(s): "AMMONIA" in the last 168 hours. CBC: Recent Labs  Lab 01/15/22 1640 01/15/22 1644 01/16/22 0514 01/17/22 0351  WBC 4.8  --  7.0 5.0  NEUTROABS 2.9  --   --   --   HGB 12.7 13.3 12.3 11.9*  HCT 37.4 39.0 37.5 34.8*  MCV 93.3  --  95.4 92.6  PLT 205  --  201 191   Cardiac Enzymes: No results for input(s): "CKTOTAL", "CKMB", "CKMBINDEX", "TROPONINI" in the last 168 hours. BNP: Invalid input(s): "POCBNP" CBG: Recent Labs  Lab 01/15/22 1640 01/18/22 2136  GLUCAP 122* 95   D-Dimer No results for input(s): "DDIMER" in the last 72 hours. Hgb A1c No results for input(s): "HGBA1C" in the last 72 hours. Lipid Profile No results for input(s): "CHOL", "HDL", "LDLCALC", "TRIG", "CHOLHDL", "LDLDIRECT" in the last 72 hours. Thyroid function studies No results for input(s): "TSH", "T4TOTAL", "T3FREE", "THYROIDAB" in the last 72 hours.  Invalid input(s): "FREET3" Anemia work up No results for input(s): "VITAMINB12", "FOLATE", "FERRITIN", "TIBC", "IRON", "RETICCTPCT" in the last 72 hours. Urinalysis    Component Value  Date/Time   COLORURINE YELLOW 01/15/2022 2036   APPEARANCEUR CLEAR 01/15/2022 2036   LABSPEC 1.044 (H) 01/15/2022 2036   PHURINE 7.0 01/15/2022 2036   GLUCOSEU NEGATIVE 01/15/2022 2036   HGBUR NEGATIVE 01/15/2022 2036   BILIRUBINUR NEGATIVE 01/15/2022 2036   KETONESUR 5 (A) 01/15/2022 2036   PROTEINUR NEGATIVE 01/15/2022 2036   NITRITE NEGATIVE 01/15/2022 2036   LEUKOCYTESUR TRACE (A) 01/15/2022 2036   Sepsis Labs Recent Labs  Lab 01/15/22 1640 01/16/22 0514 01/17/22 0351  WBC 4.8 7.0 5.0   Microbiology No results found for this or any previous visit (from the past 240 hour(s)).   Time coordinating discharge: Over 30 minutes  SIGNED:   Phillips Climes, MD  [facility] 01/21/2022, 10:22 AM Pager   If  7PM-7AM, please contact night-coverage [email or web address]

## 2022-01-22 DIAGNOSIS — R4701 Aphasia: Secondary | ICD-10-CM | POA: Diagnosis not present

## 2022-01-22 DIAGNOSIS — B370 Candidal stomatitis: Secondary | ICD-10-CM | POA: Diagnosis not present

## 2022-01-22 DIAGNOSIS — I951 Orthostatic hypotension: Secondary | ICD-10-CM | POA: Diagnosis not present

## 2022-01-22 DIAGNOSIS — I10 Essential (primary) hypertension: Secondary | ICD-10-CM | POA: Diagnosis not present

## 2022-01-22 DIAGNOSIS — G35 Multiple sclerosis: Secondary | ICD-10-CM | POA: Diagnosis not present

## 2022-01-23 DIAGNOSIS — G35 Multiple sclerosis: Secondary | ICD-10-CM | POA: Diagnosis not present

## 2022-01-23 DIAGNOSIS — S2239XA Fracture of one rib, unspecified side, initial encounter for closed fracture: Secondary | ICD-10-CM | POA: Diagnosis not present

## 2022-01-23 DIAGNOSIS — R296 Repeated falls: Secondary | ICD-10-CM | POA: Diagnosis not present

## 2022-01-23 DIAGNOSIS — B370 Candidal stomatitis: Secondary | ICD-10-CM | POA: Diagnosis not present

## 2022-01-23 DIAGNOSIS — N179 Acute kidney failure, unspecified: Secondary | ICD-10-CM | POA: Diagnosis not present

## 2022-01-24 ENCOUNTER — Encounter: Payer: Self-pay | Admitting: Gastroenterology

## 2022-01-24 DIAGNOSIS — Z79899 Other long term (current) drug therapy: Secondary | ICD-10-CM | POA: Diagnosis not present

## 2022-01-28 DIAGNOSIS — Z888 Allergy status to other drugs, medicaments and biological substances status: Secondary | ICD-10-CM | POA: Diagnosis not present

## 2022-01-28 DIAGNOSIS — Z882 Allergy status to sulfonamides status: Secondary | ICD-10-CM | POA: Diagnosis not present

## 2022-01-28 DIAGNOSIS — Z880 Allergy status to penicillin: Secondary | ICD-10-CM | POA: Diagnosis not present

## 2022-01-28 DIAGNOSIS — G500 Trigeminal neuralgia: Secondary | ICD-10-CM | POA: Diagnosis not present

## 2022-01-28 DIAGNOSIS — G35 Multiple sclerosis: Secondary | ICD-10-CM | POA: Diagnosis not present

## 2022-01-28 DIAGNOSIS — Z881 Allergy status to other antibiotic agents status: Secondary | ICD-10-CM | POA: Diagnosis not present

## 2022-02-01 DIAGNOSIS — G35 Multiple sclerosis: Secondary | ICD-10-CM | POA: Diagnosis not present

## 2022-02-01 DIAGNOSIS — I10 Essential (primary) hypertension: Secondary | ICD-10-CM | POA: Diagnosis not present

## 2022-02-01 DIAGNOSIS — I951 Orthostatic hypotension: Secondary | ICD-10-CM | POA: Diagnosis not present

## 2022-02-01 DIAGNOSIS — B370 Candidal stomatitis: Secondary | ICD-10-CM | POA: Diagnosis not present

## 2022-02-01 DIAGNOSIS — G500 Trigeminal neuralgia: Secondary | ICD-10-CM | POA: Diagnosis not present

## 2022-02-04 ENCOUNTER — Encounter: Payer: Self-pay | Admitting: Neurology

## 2022-02-04 ENCOUNTER — Ambulatory Visit: Payer: Medicare Other | Admitting: Neurology

## 2022-02-04 VITALS — BP 155/78 | HR 66 | Ht 63.0 in | Wt 130.0 lb

## 2022-02-04 DIAGNOSIS — G35 Multiple sclerosis: Secondary | ICD-10-CM | POA: Diagnosis not present

## 2022-02-04 DIAGNOSIS — R42 Dizziness and giddiness: Secondary | ICD-10-CM

## 2022-02-04 DIAGNOSIS — G500 Trigeminal neuralgia: Secondary | ICD-10-CM

## 2022-02-04 DIAGNOSIS — R269 Unspecified abnormalities of gait and mobility: Secondary | ICD-10-CM

## 2022-02-04 DIAGNOSIS — F413 Other mixed anxiety disorders: Secondary | ICD-10-CM

## 2022-02-04 NOTE — Progress Notes (Addendum)
[facility]  PATIENT: Crystal Huber DOB: Jul 10, 1945  REFERRING DOCTOR OR PCP:  Vedia Coffer SOURCE: Patient  _________________________________   HISTORICAL  CHIEF COMPLAINT:  Chief Complaint  Patient presents with   Follow-up    Pt in room #16 with kim a family friend. Pt here today for f/u on Memory loss and MS.    HISTORY OF PRESENT ILLNESS:  Crystal Huber is a 76 y.o. woman with relapsing remitting MS diagnosed in 2002.  Update 02/04/2022 A few months ago, she fell and hurt her knee.   In October she fell and hit her face and lost a tooth.  She was dizzy after bending over before the fall.    She was felt to possibly have orthostatic hypotension.  She is having more pain from trigeminal neuralgia x several weeks She is on Lamotrigine 150 mg po tid and gabapentin 300 mg po tid usually but up to 5 during a flare (she just started higher dose).     In the past, she had an [facility] [place] with  benefit x 1 month, last done about 1 years ago (Dr. Darral Dash).   She takes baclofen at night as needed as she is sleepy/groggy in the morning if she takes daily.   Carbamezapine caused hyponatremia.   She met with Dr. Reatha Armour and then Dr. Salomon Fick and is planning on doing the gamma knife in a few weeks.    In 2022, we stopped the Rebif and she feels better off the medication.  She feels her MS is mostly stable.    She is having more vertigo.   Bending over and back up may trigger a spell so she is careful.   She sometimes takes meclizine but not sure if it helps.     Her gait is off balanced, She uses a cane more the past year.   On a good day, she can walk a few hundred feet.    She denies much weakness or numbness.  She has mild cognitive issues similar to earlier this year.   She scored.  Short-term memory issues bother her the most..  Usually she does better with hints.   She is not driving. .   Mood is doing well.  Notes  feeling sad but no definite depression. She has some anxiety.   She feels less anxiety now that she has a roommate.  She sleeps well most nights.    Labs 03/03/2020 showed low Vit D (24) and she takes supplements.   TSH was fine.  B12 was normal.   LFT and Lymphocytes were fine   `     02/04/2022    1:37 PM 03/27/2020    2:03 PM 03/27/2020    1:40 PM  MMSE - Mini Mental State Exam  Orientation to time _0 Orientation to Place _1 Registration _2 Attention/ Calculation _3 Recall _4 Language- name 2 objects _5 Language- repeat _6 Language- follow 3 step command _7 Language- read & follow direction _8 Write a sentence _9 Copy design _10 Total score _11 02/04/22: She lost the 4 points due to serial sevens.  She would have scored 30/30 spelling world backwards.    MS History:   She had MRI and LP consistent with the  diagnosis of MS in 2002 after presenting with trigeminal neuralgia.   She was started on Rebif.   She is on Rebif 22 mcg, tolerates it well and has had no definite exacerbation since.       MRI of the brain 09/19/2016 shows classic MS lesions.  There were a few foci not present in 2012.  MRI of the brain 12/21/2020 showed no new lesions.   REVIEW OF SYSTEMS: Constitutional: No fevers, chills, sweats, or change in appetite.   Fatigue, worse as the day goes on. Eyes: No visual changes, double vision, eye pain Ear, nose and throat: No hearing loss, ear pain, nasal congestion, sore throat Cardiovascular: No chest pain, palpitations Respiratory:  No shortness of breath at rest or with exertion.   No wheezes GastrointestinaI: No nausea, vomiting, diarrhea, abdominal pain, fecal incontinence Genitourinary:  No dysuria, urinary retention or frequency.  No nocturia. Musculoskeletal:  No neck pain, back pain Integumentary: No rash, pruritus, skin lesions Neurological: as above Psychiatric: Some depression and anxiety. Endocrine: No  palpitations, diaphoresis, change in appetite, change in weigh or increased thirst Hematologic/Lymphatic:  No anemia, purpura, petechiae..   Bruises easily Allergic/Immunologic: No itchy/runny eyes, nasal congestion, recent allergic reactions, rashes  ALLERGIES: Allergies  Allergen Reactions   Carbamazepine     Decreased sodium level   Imipramine     Night sweats, more intense dreams   Metronidazole Nausea Only   Oxcarbazepine     Decreased sodium level   Penicillins Swelling    Yeast infection   Red Blood Cells Other (See Comments)    Jehovah's witness, does not want blood products   Statins     Gets hyponatremia and severe muscle pain   Sulfa Antibiotics Swelling    yeast infection    HOME MEDICATIONS:  Current Outpatient Medications:    aspirin EC 81 MG tablet, Take 1 tablet (81 mg total) by mouth daily. Swallow whole., Disp: 30 tablet, Rfl: 12   Biotin w/ Vitamins C & E (HAIR/SKIN/NAILS PO), Take 1 tablet by mouth daily., Disp: , Rfl:    citalopram (CELEXA) 20 MG tablet, Take 1 tablet (20 mg total) by mouth daily. (Patient taking differently: Take 30 mg by mouth daily.), Disp: 30 tablet, Rfl: 3   gabapentin (NEURONTIN) 300 MG capsule, Take 1 or 2 pills p.o. 3 times a day (up to 5 a day) (Patient taking differently: 300 mg 3 (three) times daily.), Disp: 450 capsule, Rfl: 3   hydrALAZINE (APRESOLINE) 50 MG tablet, TAKE ONE TABLET BY MOUTH TWICE DAILY (Patient taking differently: Take 50 mg by mouth in the morning and at bedtime.), Disp: 180 tablet, Rfl: 1   lamoTRIgine (LAMICTAL) 150 MG tablet, Take 1 tablet (150 mg total) by mouth 3 (three) times daily., Disp: , Rfl:    latanoprost (XALATAN) 0.005 % ophthalmic solution, Place 1 drop into both eyes at bedtime., Disp: , Rfl:    losartan (COZAAR) 25 MG tablet, Take 1 tablet (25 mg total) by mouth daily., Disp: , Rfl:    meclizine (ANTIVERT) 25 MG tablet, Take 25 mg by mouth daily., Disp: , Rfl:    metoprolol succinate (TOPROL XL)  25 MG 24 hr tablet, Take 1 tablet (25 mg total) by mouth daily., Disp: , Rfl: 0   Multiple Vitamin (MULTIVITAMIN) tablet, Take 1 tablet by mouth daily., Disp: , Rfl:    timolol (TIMOPTIC) 0.5 % ophthalmic solution, Place 1 drop into both eyes every morning., Disp: , Rfl:   PAST MEDICAL HISTORY: Past  Medical History:  Diagnosis Date   Arthritis    right pinkie   Cancer (HCC)    right BR  CA    Cataract    right eye   GERD (gastroesophageal reflux disease)    prn tums, mild not often   Glaucoma    Hyperlipidemia    Hypertension    Multiple sclerosis ([place])    Neuromuscular disorder ([place])    trigeminal neuralgia   Refusal of blood product    Trigeminal neuralgia of left side of face     PAST SURGICAL HISTORY: Past Surgical History:  Procedure Laterality Date   BREAST LUMPECTOMY Right    wears prosthesis   COLONOSCOPY  12/20/2010   ganglion cyst removal Bilateral    PARTIAL HYSTERECTOMY     POLYPECTOMY      FAMILY HISTORY: Family History  Problem Relation Age of Onset   Congestive Heart Failure Mother    Heart disease Father    Heart attack Father    Stroke Sister    Breast cancer Sister    Heart disease Sister    Heart disease Brother    Heart disease Brother    Heart disease Brother    Colon cancer Neg Hx    Colon polyps Neg Hx     SOCIAL HISTORY:  Social History   Socioeconomic History   Marital status: Widowed    Spouse name: Not on file   Number of children: Not on file   Years of education: Not on file   Highest education level: Not on file  Occupational History   Not on file  Tobacco Use   Smoking status: Former    Years: 1.50    Types: Cigarettes   Smokeless tobacco: Never   Tobacco comments:    Quit smoking at age 33 and only smoke occasional x 1.5 years   Vaping Use   Vaping Use: Never used  Substance and Sexual Activity   Alcohol use: Not Currently   Drug use: No   Sexual activity: Not on file  Other Topics Concern   Not on file   Social History Narrative   Diet      Do you drink/eat things with caffeine      Marital Status     What year were you married?      Do you live in a house, apartment, assisted living, condo, trailer, etc.?      Is it one or more stories?      How many persons live in your home?         Do you have any pets in your home?(please list)      Highest level of education completed:      Current or past profession:      Do you exercise?:    Type and how often:      Do you have a Living Will? (Form that indicates scenarios where you would not want your life prolonged)      Do you have a DNR form?         If not, would you like to discuss one?      Do you have signed POA/HPOA forms? Yes      Do you have difficulty bathing or dressing yourself?      Do you have difficulty preparing food or eating?      Do you have difficulty managing medications?      Do you have difficulty managing your finances?  Do you have difficulty affording your medications?                     Social Determinants of Health   Financial Resource Strain: Not on file  Food Insecurity: No Food Insecurity (01/16/2022)   Hunger Vital Sign    Worried About Running Out of Food in the Last Year: Never true    Ran Out of Food in the Last Year: Never true  Transportation Needs: No Transportation Needs (01/16/2022)   PRAPARE - Hydrologist (Medical): No    Lack of Transportation (Non-Medical): No  Physical Activity: Not on file  Stress: Not on file  Social Connections: Not on file  Intimate Partner Violence: Not At Risk (01/16/2022)   Humiliation, Afraid, Rape, and Kick questionnaire    Fear of Current or Ex-Partner: No    Emotionally Abused: No    Physically Abused: No    Sexually Abused: No     PHYSICAL EXAM  Vitals:   02/04/22 1332  BP: (!) 155/78  Pulse: 66  Weight: 130 lb (59 kg)  Height: _0  (1.6 m)    Body mass index is 23.03 kg/m.    General: The  patient is well-developed and well-nourished and in no acute distress  Neck:  The neck has good range of motion.  Neurologic Exam  Mental status: The patient is alert and oriented x 3 at the time of the examination. The patient has apparent normal recent and remote memory, with an apparently normal attention span and concentration ability.   Speech is normal.  Cranial nerves: Extraocular movements are full. No nystagmus.  Facial strength and sensation was normal.  She has reduced left-sided hearing.. The Weber does not lateralize.   Motor: Tremor not notable. Muscle bulk is normal.    normal muscle tone in the arms strength is  5 / 5 in all 4 extremities.   Sensory: She has normal symmetric sensation to touch and vibration.  Coordination: Cerebellar testing shows good finger nose finger and heel to shin.   Gait and station: Station is normal.  She has a walker but is able to walk around the room with mildly wide gait.  The tandem is wide..   Romberg is negative...    Reflexes: Deep tendon reflexes are symmetric and normal bilaterally.        ASSESSMENT AND PLAN  Multiple sclerosis (HCC)  Trigeminal neuralgia  Gait disorder  Vertigo  Other mixed anxiety disorders   1.   Remain off the Rebif.  Sometime next year we will check another MRI of the brain to ensure that there has been stability.  If progression has occurred we would need to reconsider restarting a disease modifying therapy. 2.   Continue Lamotrigine 150 mg po tid and gabapentin at higher dose for Trigeminal neuralgia.     Likely due to the MS as no definite vascular loop seen on MRI.   She will have a gamma knife procedure ([facility]/Dr. Salomon Fick) and if she gets a significant improvement in pain intensity we could consider reducing some of the medications 3.   Cognition is stable.    Advised not to drive 4.   Continue to be active.  Gait is stable.   Continue vitamin D supplements  Rtc 6 months, sooner if problems     Hermena Swint A. Felecia Shelling, MD, PhD 26/83/4196, 2:22 PM Certified in Neurology, Clinical Neurophysiology, Sleep Medicine, Pain Medicine and Neuroimaging  [facility]  [place], [address] [place], [place] [address] [phone]

## 2022-02-06 ENCOUNTER — Other Ambulatory Visit: Payer: Self-pay | Admitting: Nurse Practitioner

## 2022-02-11 DIAGNOSIS — Z789 Other specified health status: Secondary | ICD-10-CM | POA: Diagnosis not present

## 2022-02-11 DIAGNOSIS — G35 Multiple sclerosis: Secondary | ICD-10-CM | POA: Diagnosis not present

## 2022-02-11 DIAGNOSIS — R251 Tremor, unspecified: Secondary | ICD-10-CM | POA: Diagnosis not present

## 2022-02-11 DIAGNOSIS — R4701 Aphasia: Secondary | ICD-10-CM | POA: Diagnosis not present

## 2022-02-11 DIAGNOSIS — S82091S Other fracture of right patella, sequela: Secondary | ICD-10-CM | POA: Diagnosis not present

## 2022-02-11 DIAGNOSIS — I10 Essential (primary) hypertension: Secondary | ICD-10-CM | POA: Diagnosis not present

## 2022-02-12 ENCOUNTER — Telehealth: Payer: Self-pay

## 2022-02-12 DIAGNOSIS — R251 Tremor, unspecified: Secondary | ICD-10-CM | POA: Diagnosis not present

## 2022-02-12 DIAGNOSIS — R4701 Aphasia: Secondary | ICD-10-CM | POA: Diagnosis not present

## 2022-02-12 DIAGNOSIS — I10 Essential (primary) hypertension: Secondary | ICD-10-CM | POA: Diagnosis not present

## 2022-02-12 DIAGNOSIS — G35 Multiple sclerosis: Secondary | ICD-10-CM | POA: Diagnosis not present

## 2022-02-12 DIAGNOSIS — Z789 Other specified health status: Secondary | ICD-10-CM | POA: Diagnosis not present

## 2022-02-12 DIAGNOSIS — S82091S Other fracture of right patella, sequela: Secondary | ICD-10-CM | POA: Diagnosis not present

## 2022-02-12 NOTE — Telephone Encounter (Signed)
Okay per [facility] protocol

## 2022-02-12 NOTE — Telephone Encounter (Signed)
Sonal speech therapist was advised and verbalized understanding.

## 2022-02-12 NOTE — Telephone Encounter (Signed)
Sonal speech therapist called requesting orders for speech therapy: Twice a week for 3 weeks.

## 2022-02-13 ENCOUNTER — Telehealth: Payer: Self-pay

## 2022-02-13 NOTE — Telephone Encounter (Signed)
Was contacted by patients home health to get and extension for pt for the next 2 weeks gave verbal verification as we have seen the patient within the last 30 days

## 2022-02-18 DIAGNOSIS — I10 Essential (primary) hypertension: Secondary | ICD-10-CM | POA: Diagnosis not present

## 2022-02-18 DIAGNOSIS — S82209A Unspecified fracture of shaft of unspecified tibia, initial encounter for closed fracture: Secondary | ICD-10-CM | POA: Diagnosis not present

## 2022-02-18 DIAGNOSIS — R4701 Aphasia: Secondary | ICD-10-CM | POA: Diagnosis not present

## 2022-02-18 DIAGNOSIS — I951 Orthostatic hypotension: Secondary | ICD-10-CM | POA: Diagnosis not present

## 2022-02-18 DIAGNOSIS — R251 Tremor, unspecified: Secondary | ICD-10-CM | POA: Diagnosis not present

## 2022-02-18 DIAGNOSIS — G35 Multiple sclerosis: Secondary | ICD-10-CM | POA: Diagnosis not present

## 2022-02-18 DIAGNOSIS — S82091D Other fracture of right patella, subsequent encounter for closed fracture with routine healing: Secondary | ICD-10-CM | POA: Diagnosis not present

## 2022-02-20 DIAGNOSIS — R4701 Aphasia: Secondary | ICD-10-CM | POA: Diagnosis not present

## 2022-02-20 DIAGNOSIS — I951 Orthostatic hypotension: Secondary | ICD-10-CM | POA: Diagnosis not present

## 2022-02-20 DIAGNOSIS — G35 Multiple sclerosis: Secondary | ICD-10-CM | POA: Diagnosis not present

## 2022-02-20 DIAGNOSIS — S82091D Other fracture of right patella, subsequent encounter for closed fracture with routine healing: Secondary | ICD-10-CM | POA: Diagnosis not present

## 2022-02-20 DIAGNOSIS — I10 Essential (primary) hypertension: Secondary | ICD-10-CM | POA: Diagnosis not present

## 2022-02-20 DIAGNOSIS — R251 Tremor, unspecified: Secondary | ICD-10-CM | POA: Diagnosis not present

## 2022-02-22 DIAGNOSIS — R4701 Aphasia: Secondary | ICD-10-CM | POA: Diagnosis not present

## 2022-02-22 DIAGNOSIS — G35 Multiple sclerosis: Secondary | ICD-10-CM | POA: Diagnosis not present

## 2022-02-22 DIAGNOSIS — I10 Essential (primary) hypertension: Secondary | ICD-10-CM | POA: Diagnosis not present

## 2022-02-22 DIAGNOSIS — I951 Orthostatic hypotension: Secondary | ICD-10-CM | POA: Diagnosis not present

## 2022-02-22 DIAGNOSIS — S82091D Other fracture of right patella, subsequent encounter for closed fracture with routine healing: Secondary | ICD-10-CM | POA: Diagnosis not present

## 2022-02-22 DIAGNOSIS — R251 Tremor, unspecified: Secondary | ICD-10-CM | POA: Diagnosis not present

## 2022-02-27 DIAGNOSIS — I10 Essential (primary) hypertension: Secondary | ICD-10-CM | POA: Diagnosis not present

## 2022-02-27 DIAGNOSIS — I951 Orthostatic hypotension: Secondary | ICD-10-CM | POA: Diagnosis not present

## 2022-02-27 DIAGNOSIS — S82091D Other fracture of right patella, subsequent encounter for closed fracture with routine healing: Secondary | ICD-10-CM | POA: Diagnosis not present

## 2022-02-27 DIAGNOSIS — R4701 Aphasia: Secondary | ICD-10-CM | POA: Diagnosis not present

## 2022-02-27 DIAGNOSIS — R251 Tremor, unspecified: Secondary | ICD-10-CM | POA: Diagnosis not present

## 2022-02-27 DIAGNOSIS — G35 Multiple sclerosis: Secondary | ICD-10-CM | POA: Diagnosis not present

## 2022-02-28 DIAGNOSIS — G35 Multiple sclerosis: Secondary | ICD-10-CM | POA: Diagnosis not present

## 2022-02-28 DIAGNOSIS — S82091D Other fracture of right patella, subsequent encounter for closed fracture with routine healing: Secondary | ICD-10-CM | POA: Diagnosis not present

## 2022-02-28 DIAGNOSIS — I951 Orthostatic hypotension: Secondary | ICD-10-CM | POA: Diagnosis not present

## 2022-02-28 DIAGNOSIS — R251 Tremor, unspecified: Secondary | ICD-10-CM | POA: Diagnosis not present

## 2022-02-28 DIAGNOSIS — I10 Essential (primary) hypertension: Secondary | ICD-10-CM | POA: Diagnosis not present

## 2022-02-28 DIAGNOSIS — R4701 Aphasia: Secondary | ICD-10-CM | POA: Diagnosis not present

## 2022-03-01 ENCOUNTER — Telehealth: Payer: Self-pay | Admitting: Neurology

## 2022-03-01 NOTE — Telephone Encounter (Signed)
Pt states she now resides at [facility] and she is needing a sent to Fax [phone] stating that she takes her gabapentin (NEURONTIN) 300 MG capsule  as needed up to 5 a day Please advise.

## 2022-03-04 ENCOUNTER — Other Ambulatory Visit: Payer: Self-pay | Admitting: *Deleted

## 2022-03-04 DIAGNOSIS — R4701 Aphasia: Secondary | ICD-10-CM | POA: Diagnosis not present

## 2022-03-04 DIAGNOSIS — I951 Orthostatic hypotension: Secondary | ICD-10-CM | POA: Diagnosis not present

## 2022-03-04 DIAGNOSIS — G35 Multiple sclerosis: Secondary | ICD-10-CM | POA: Diagnosis not present

## 2022-03-04 DIAGNOSIS — I10 Essential (primary) hypertension: Secondary | ICD-10-CM | POA: Diagnosis not present

## 2022-03-04 DIAGNOSIS — S82091D Other fracture of right patella, subsequent encounter for closed fracture with routine healing: Secondary | ICD-10-CM | POA: Diagnosis not present

## 2022-03-04 DIAGNOSIS — R251 Tremor, unspecified: Secondary | ICD-10-CM | POA: Diagnosis not present

## 2022-03-04 MED ORDER — GABAPENTIN 300 MG PO CAPS: ORAL_CAPSULE | ORAL | 3 refills | Status: DC

## 2022-03-04 NOTE — Telephone Encounter (Signed)
Rx printed, waiting on MD signature and then will fax

## 2022-03-05 DIAGNOSIS — S82091D Other fracture of right patella, subsequent encounter for closed fracture with routine healing: Secondary | ICD-10-CM | POA: Diagnosis not present

## 2022-03-05 DIAGNOSIS — G35 Multiple sclerosis: Secondary | ICD-10-CM | POA: Diagnosis not present

## 2022-03-05 DIAGNOSIS — I951 Orthostatic hypotension: Secondary | ICD-10-CM | POA: Diagnosis not present

## 2022-03-05 DIAGNOSIS — R251 Tremor, unspecified: Secondary | ICD-10-CM | POA: Diagnosis not present

## 2022-03-05 DIAGNOSIS — I10 Essential (primary) hypertension: Secondary | ICD-10-CM | POA: Diagnosis not present

## 2022-03-05 DIAGNOSIS — R4701 Aphasia: Secondary | ICD-10-CM | POA: Diagnosis not present

## 2022-03-05 NOTE — Telephone Encounter (Signed)
Late entry from 03/04/2022: rx faxed to number provided by pt. Received fax confirmation.

## 2022-03-06 DIAGNOSIS — R251 Tremor, unspecified: Secondary | ICD-10-CM | POA: Diagnosis not present

## 2022-03-06 DIAGNOSIS — G35 Multiple sclerosis: Secondary | ICD-10-CM | POA: Diagnosis not present

## 2022-03-06 DIAGNOSIS — I10 Essential (primary) hypertension: Secondary | ICD-10-CM | POA: Diagnosis not present

## 2022-03-06 DIAGNOSIS — I951 Orthostatic hypotension: Secondary | ICD-10-CM | POA: Diagnosis not present

## 2022-03-06 DIAGNOSIS — R4701 Aphasia: Secondary | ICD-10-CM | POA: Diagnosis not present

## 2022-03-06 DIAGNOSIS — S82091D Other fracture of right patella, subsequent encounter for closed fracture with routine healing: Secondary | ICD-10-CM | POA: Diagnosis not present

## 2022-03-07 DIAGNOSIS — R251 Tremor, unspecified: Secondary | ICD-10-CM | POA: Diagnosis not present

## 2022-03-07 DIAGNOSIS — G35 Multiple sclerosis: Secondary | ICD-10-CM | POA: Diagnosis not present

## 2022-03-07 DIAGNOSIS — I10 Essential (primary) hypertension: Secondary | ICD-10-CM | POA: Diagnosis not present

## 2022-03-07 DIAGNOSIS — S82091D Other fracture of right patella, subsequent encounter for closed fracture with routine healing: Secondary | ICD-10-CM | POA: Diagnosis not present

## 2022-03-07 DIAGNOSIS — R4701 Aphasia: Secondary | ICD-10-CM | POA: Diagnosis not present

## 2022-03-07 DIAGNOSIS — I951 Orthostatic hypotension: Secondary | ICD-10-CM | POA: Diagnosis not present

## 2022-03-13 DIAGNOSIS — I951 Orthostatic hypotension: Secondary | ICD-10-CM | POA: Diagnosis not present

## 2022-03-13 DIAGNOSIS — G35 Multiple sclerosis: Secondary | ICD-10-CM | POA: Diagnosis not present

## 2022-03-13 DIAGNOSIS — R4701 Aphasia: Secondary | ICD-10-CM | POA: Diagnosis not present

## 2022-03-13 DIAGNOSIS — I10 Essential (primary) hypertension: Secondary | ICD-10-CM | POA: Diagnosis not present

## 2022-03-13 DIAGNOSIS — R251 Tremor, unspecified: Secondary | ICD-10-CM | POA: Diagnosis not present

## 2022-03-13 DIAGNOSIS — S82091D Other fracture of right patella, subsequent encounter for closed fracture with routine healing: Secondary | ICD-10-CM | POA: Diagnosis not present

## 2022-03-14 DIAGNOSIS — I951 Orthostatic hypotension: Secondary | ICD-10-CM | POA: Diagnosis not present

## 2022-03-14 DIAGNOSIS — G35 Multiple sclerosis: Secondary | ICD-10-CM | POA: Diagnosis not present

## 2022-03-14 DIAGNOSIS — I10 Essential (primary) hypertension: Secondary | ICD-10-CM | POA: Diagnosis not present

## 2022-03-14 DIAGNOSIS — R251 Tremor, unspecified: Secondary | ICD-10-CM | POA: Diagnosis not present

## 2022-03-14 DIAGNOSIS — S82091D Other fracture of right patella, subsequent encounter for closed fracture with routine healing: Secondary | ICD-10-CM | POA: Diagnosis not present

## 2022-03-14 DIAGNOSIS — R4701 Aphasia: Secondary | ICD-10-CM | POA: Diagnosis not present

## 2022-03-19 DIAGNOSIS — R251 Tremor, unspecified: Secondary | ICD-10-CM | POA: Diagnosis not present

## 2022-03-19 DIAGNOSIS — S82091D Other fracture of right patella, subsequent encounter for closed fracture with routine healing: Secondary | ICD-10-CM | POA: Diagnosis not present

## 2022-03-19 DIAGNOSIS — R4701 Aphasia: Secondary | ICD-10-CM | POA: Diagnosis not present

## 2022-03-19 DIAGNOSIS — I10 Essential (primary) hypertension: Secondary | ICD-10-CM | POA: Diagnosis not present

## 2022-03-19 DIAGNOSIS — I951 Orthostatic hypotension: Secondary | ICD-10-CM | POA: Diagnosis not present

## 2022-03-19 DIAGNOSIS — G35 Multiple sclerosis: Secondary | ICD-10-CM | POA: Diagnosis not present

## 2022-03-21 DIAGNOSIS — S82209A Unspecified fracture of shaft of unspecified tibia, initial encounter for closed fracture: Secondary | ICD-10-CM | POA: Diagnosis not present

## 2022-03-28 DIAGNOSIS — R4701 Aphasia: Secondary | ICD-10-CM | POA: Diagnosis not present

## 2022-03-28 DIAGNOSIS — R251 Tremor, unspecified: Secondary | ICD-10-CM | POA: Diagnosis not present

## 2022-03-28 DIAGNOSIS — G35 Multiple sclerosis: Secondary | ICD-10-CM | POA: Diagnosis not present

## 2022-03-28 DIAGNOSIS — I10 Essential (primary) hypertension: Secondary | ICD-10-CM | POA: Diagnosis not present

## 2022-03-28 DIAGNOSIS — I951 Orthostatic hypotension: Secondary | ICD-10-CM | POA: Diagnosis not present

## 2022-03-28 DIAGNOSIS — S82091D Other fracture of right patella, subsequent encounter for closed fracture with routine healing: Secondary | ICD-10-CM | POA: Diagnosis not present

## 2022-04-01 ENCOUNTER — Telehealth: Payer: Self-pay | Admitting: Neurology

## 2022-04-01 NOTE — Telephone Encounter (Signed)
Noted  

## 2022-04-01 NOTE — Telephone Encounter (Signed)
Pt is asking if the fax has been received having to do with her waiver of premium for medication Rebif, please call.

## 2022-04-01 NOTE — Telephone Encounter (Signed)
Pt is having the form faxed to the medical records dept.

## 2022-04-01 NOTE — Telephone Encounter (Signed)
Crystal Huber, we have not received this. Can you f/u with pt to get a copy? Thank you

## 2022-04-02 DIAGNOSIS — S82091D Other fracture of right patella, subsequent encounter for closed fracture with routine healing: Secondary | ICD-10-CM | POA: Diagnosis not present

## 2022-04-02 DIAGNOSIS — R251 Tremor, unspecified: Secondary | ICD-10-CM | POA: Diagnosis not present

## 2022-04-02 DIAGNOSIS — I10 Essential (primary) hypertension: Secondary | ICD-10-CM | POA: Diagnosis not present

## 2022-04-02 DIAGNOSIS — I951 Orthostatic hypotension: Secondary | ICD-10-CM | POA: Diagnosis not present

## 2022-04-02 DIAGNOSIS — R4701 Aphasia: Secondary | ICD-10-CM | POA: Diagnosis not present

## 2022-04-02 DIAGNOSIS — G35 Multiple sclerosis: Secondary | ICD-10-CM | POA: Diagnosis not present

## 2022-04-17 ENCOUNTER — Other Ambulatory Visit: Payer: Self-pay

## 2022-04-17 MED ORDER — LOSARTAN POTASSIUM 50 MG PO TABS: 25.0000 mg | ORAL_TABLET | Freq: Every day | ORAL | 1 refills | Status: DC

## 2022-04-19 DIAGNOSIS — H524 Presbyopia: Secondary | ICD-10-CM | POA: Diagnosis not present

## 2022-04-19 DIAGNOSIS — H04123 Dry eye syndrome of bilateral lacrimal glands: Secondary | ICD-10-CM | POA: Diagnosis not present

## 2022-04-19 DIAGNOSIS — H43813 Vitreous degeneration, bilateral: Secondary | ICD-10-CM | POA: Diagnosis not present

## 2022-04-19 DIAGNOSIS — H401131 Primary open-angle glaucoma, bilateral, mild stage: Secondary | ICD-10-CM | POA: Diagnosis not present

## 2022-04-19 DIAGNOSIS — H5213 Myopia, bilateral: Secondary | ICD-10-CM | POA: Diagnosis not present

## 2022-04-19 DIAGNOSIS — H2513 Age-related nuclear cataract, bilateral: Secondary | ICD-10-CM | POA: Diagnosis not present

## 2022-04-21 DIAGNOSIS — S82209A Unspecified fracture of shaft of unspecified tibia, initial encounter for closed fracture: Secondary | ICD-10-CM | POA: Diagnosis not present

## 2022-04-22 DIAGNOSIS — H2512 Age-related nuclear cataract, left eye: Secondary | ICD-10-CM | POA: Insufficient documentation

## 2022-04-25 DIAGNOSIS — G500 Trigeminal neuralgia: Secondary | ICD-10-CM | POA: Diagnosis not present

## 2022-04-25 DIAGNOSIS — Z510 Encounter for antineoplastic radiation therapy: Secondary | ICD-10-CM | POA: Diagnosis not present

## 2022-04-25 DIAGNOSIS — G35 Multiple sclerosis: Secondary | ICD-10-CM | POA: Diagnosis not present

## 2022-04-29 ENCOUNTER — Other Ambulatory Visit: Payer: Self-pay | Admitting: Nurse Practitioner

## 2022-04-29 DIAGNOSIS — F413 Other mixed anxiety disorders: Secondary | ICD-10-CM

## 2022-05-17 HISTORY — PX: NERVE SURGERY: SHX1016

## 2022-05-17 HISTORY — PX: CATARACT EXTRACTION: SUR2

## 2022-05-20 DIAGNOSIS — S82209A Unspecified fracture of shaft of unspecified tibia, initial encounter for closed fracture: Secondary | ICD-10-CM | POA: Diagnosis not present

## 2022-05-22 DIAGNOSIS — R2689 Other abnormalities of gait and mobility: Secondary | ICD-10-CM | POA: Diagnosis not present

## 2022-05-22 DIAGNOSIS — H52223 Regular astigmatism, bilateral: Secondary | ICD-10-CM | POA: Diagnosis not present

## 2022-05-22 DIAGNOSIS — R42 Dizziness and giddiness: Secondary | ICD-10-CM | POA: Diagnosis not present

## 2022-05-22 DIAGNOSIS — H2513 Age-related nuclear cataract, bilateral: Secondary | ICD-10-CM | POA: Diagnosis not present

## 2022-05-22 DIAGNOSIS — H401132 Primary open-angle glaucoma, bilateral, moderate stage: Secondary | ICD-10-CM | POA: Diagnosis not present

## 2022-05-22 DIAGNOSIS — G500 Trigeminal neuralgia: Secondary | ICD-10-CM | POA: Diagnosis not present

## 2022-05-22 DIAGNOSIS — H5213 Myopia, bilateral: Secondary | ICD-10-CM | POA: Diagnosis not present

## 2022-05-22 DIAGNOSIS — G35 Multiple sclerosis: Secondary | ICD-10-CM | POA: Diagnosis not present

## 2022-05-22 DIAGNOSIS — H524 Presbyopia: Secondary | ICD-10-CM | POA: Diagnosis not present

## 2022-05-22 DIAGNOSIS — H5371 Glare sensitivity: Secondary | ICD-10-CM | POA: Diagnosis not present

## 2022-05-23 ENCOUNTER — Telehealth: Payer: Self-pay | Admitting: *Deleted

## 2022-05-23 NOTE — Telephone Encounter (Signed)
[phone] form fax number.

## 2022-05-23 NOTE — Telephone Encounter (Signed)
Gave completed/signed attending physicians statement of disability form back to medical records to process for pt.

## 2022-05-23 NOTE — Telephone Encounter (Signed)
Pt attending physician form faxed on 05/23/2022 copy mail to pt.

## 2022-05-27 ENCOUNTER — Other Ambulatory Visit: Payer: Self-pay | Admitting: Nurse Practitioner

## 2022-05-27 DIAGNOSIS — F413 Other mixed anxiety disorders: Secondary | ICD-10-CM

## 2022-05-29 ENCOUNTER — Other Ambulatory Visit: Payer: Self-pay | Admitting: Nurse Practitioner

## 2022-05-29 DIAGNOSIS — F413 Other mixed anxiety disorders: Secondary | ICD-10-CM

## 2022-06-03 DIAGNOSIS — H401122 Primary open-angle glaucoma, left eye, moderate stage: Secondary | ICD-10-CM | POA: Diagnosis not present

## 2022-06-03 DIAGNOSIS — H2512 Age-related nuclear cataract, left eye: Secondary | ICD-10-CM | POA: Diagnosis not present

## 2022-06-04 DIAGNOSIS — Z961 Presence of intraocular lens: Secondary | ICD-10-CM | POA: Diagnosis not present

## 2022-06-04 DIAGNOSIS — L233 Allergic contact dermatitis due to drugs in contact with skin: Secondary | ICD-10-CM | POA: Diagnosis not present

## 2022-06-11 DIAGNOSIS — H401132 Primary open-angle glaucoma, bilateral, moderate stage: Secondary | ICD-10-CM | POA: Diagnosis not present

## 2022-06-11 DIAGNOSIS — H2511 Age-related nuclear cataract, right eye: Secondary | ICD-10-CM | POA: Diagnosis not present

## 2022-06-12 ENCOUNTER — Other Ambulatory Visit: Payer: Self-pay

## 2022-06-12 DIAGNOSIS — F413 Other mixed anxiety disorders: Secondary | ICD-10-CM

## 2022-06-12 NOTE — Telephone Encounter (Signed)
Patient was calling to see why her citalopram was refused. Patient last had medication refilled 04/29/2022 with 0 refills  Medication pended and sent to Sherrie Mustache to refill if appropriate

## 2022-06-13 MED ORDER — CITALOPRAM HYDROBROMIDE 20 MG PO TABS: 20.0000 mg | ORAL_TABLET | Freq: Every day | ORAL | 1 refills | Status: DC

## 2022-06-17 ENCOUNTER — Encounter: Payer: Self-pay | Admitting: Nurse Practitioner

## 2022-06-17 ENCOUNTER — Ambulatory Visit (INDEPENDENT_AMBULATORY_CARE_PROVIDER_SITE_OTHER): Payer: Medicare Other | Admitting: Nurse Practitioner

## 2022-06-17 VITALS — BP 122/78 | HR 55 | Temp 97.5°F | Ht 63.0 in | Wt 133.0 lb

## 2022-06-17 DIAGNOSIS — R300 Dysuria: Secondary | ICD-10-CM | POA: Diagnosis not present

## 2022-06-17 DIAGNOSIS — G35 Multiple sclerosis: Secondary | ICD-10-CM | POA: Diagnosis not present

## 2022-06-17 DIAGNOSIS — E7800 Pure hypercholesterolemia, unspecified: Secondary | ICD-10-CM

## 2022-06-17 DIAGNOSIS — G500 Trigeminal neuralgia: Secondary | ICD-10-CM

## 2022-06-17 DIAGNOSIS — I700 Atherosclerosis of aorta: Secondary | ICD-10-CM

## 2022-06-17 DIAGNOSIS — I10 Essential (primary) hypertension: Secondary | ICD-10-CM | POA: Diagnosis not present

## 2022-06-17 DIAGNOSIS — F419 Anxiety disorder, unspecified: Secondary | ICD-10-CM

## 2022-06-17 LAB — POCT URINALYSIS DIPSTICK
Bilirubin, UA: NEGATIVE
Blood, UA: NEGATIVE
Glucose, UA: NEGATIVE
Ketones, UA: NEGATIVE
Nitrite, UA: NEGATIVE
Protein, UA: NEGATIVE
Spec Grav, UA: <=1.005 — AB (ref 1.010–1.025)
Urobilinogen, UA: 0.2 E.U./dL
pH, UA: 7 (ref 5.0–8.0)

## 2022-06-17 MED ORDER — CITALOPRAM HYDROBROMIDE 10 MG PO TABS: 10.0000 mg | ORAL_TABLET | Freq: Every day | ORAL | 3 refills | Status: DC

## 2022-06-17 NOTE — Patient Instructions (Addendum)
Restart celexa- start with half tablet for 2 weeks then increase to 1 tablet daily   To call [phone] to schedule bone density  Recommended to take calcium 600 mg twice daily with Vitamin D 2000 units daily and weight bearing activity 30 mins/5 days a week   Let us know who did your last colonoscopy- call to schedule follow up

## 2022-06-17 NOTE — Progress Notes (Signed)
Careteam: Patient Care Team: [name], NP as PCP - General (Geriatric Medicine)  PLACE OF SERVICE:  [facility] Directive information Does Patient Have a Medical Advance Directive?: Yes, Type of Advance Directive: [place];Living will, Does patient want to make changes to medical advance directive?: No - Patient declined  Allergies  Allergen Reactions   Carbamazepine     Decreased sodium level   Imipramine     Night sweats, more intense dreams   Metronidazole Nausea Only   Oxcarbazepine     Decreased sodium level   Penicillins Swelling    Yeast infection   Red Blood Cells Other (See Comments)    Jehovah's witness, does not want blood products   Statins     Gets hyponatremia and severe muscle pain   Sulfa Antibiotics Swelling    yeast infection    Chief Complaint  Patient presents with   Follow-up    Follow-up on anxiety medication. Discuss ASA,  one a day vitamin vs Hair skin and nails, and losartan dose. Patient went off citalopram cold [place] due to increased crying and feeling blue. Patient c/o discolored urine x 1-2 weeks and would like urine checked. Patient is requesting disability placard.      HPI: Patient is a 77 y.o. female here for f/u of anxiety medication.  She fell in July 2023 and was hospitalized for a few days then went to rehab. She has broken some ribs. In Nov she was hospitalized again post-fall with related aphasia. She went to SNF for rehab and was really pleased with the therapy she had.   She has a caregiver 4 hours in the morning M-F. She used to have a roommate that was with her all the time but the roommate moved out. She does not have any family locally.  She wants to stay home as long as she can and as long as she is safe. The therapist at the ALF helped her navigate her house at home. Her husband was disabled and her house is set up for handicap use.   The patient denies dysuria, frequency, urgency.  She says she remembers being treated for a UTI during her hospitalization and she feels her urine is still 'discolored and orangey looking' so she wants to make sure the infection is gone. She drinks about 18 oz of water a day and several cups of coffee.    She has stopped the celexa that another provider had prescribed because she ran out of refills. She's been having a lot of tearful episodes since she ran out. She feels sad about her siblings passing away. She is the 13th sibling out of 59 and she feels sad about watching them all pass away with the remaining 4 not doing very well.   She is wondering about her Vitamin D, calcium, multivitamin and DEXA scan.   She is going for her colonoscopy after her cataract surgery.   She will follow-up with the neurologist for MS.  She is not driving any longer due to the vertigo but she wants a renewal for her parking pass for when her caregiver is driving.    Review of Systems:  Review of Systems  Constitutional:  Negative for chills, fever, malaise/fatigue and weight loss.  HENT:  Negative for congestion and sore throat.   Eyes:  Negative for blurred vision.  Respiratory:  Negative for cough, shortness of breath and wheezing.   Cardiovascular:  Negative for chest pain, palpitations  and leg swelling.  Gastrointestinal:  Negative for abdominal pain, blood in stool, constipation, diarrhea, heartburn, nausea and vomiting.  Genitourinary:  Negative for dysuria, frequency, hematuria and urgency.  Musculoskeletal:  Negative for falls and joint pain.  Skin:  Negative for rash.  Neurological:  Negative for dizziness, tingling and headaches.  Endo/Heme/Allergies:  Negative for polydipsia.  Psychiatric/Behavioral:  Positive for depression and memory loss. The patient is not nervous/anxious.        No changes in mild memory loss, stable at this time   Past Medical History:  Diagnosis Date   Arthritis    right pinkie   Cancer    right BR  CA     Cataract    right eye   GERD (gastroesophageal reflux disease)    prn tums, mild not often   Glaucoma    Hyperlipidemia    Hypertension    Multiple sclerosis    Neuromuscular disorder    trigeminal neuralgia   Refusal of blood product    Trigeminal neuralgia of left side of face    Past Surgical History:  Procedure Laterality Date   BREAST LUMPECTOMY Right    wears prosthesis   CATARACT EXTRACTION Left 05/2022   COLONOSCOPY  12/20/2010   ganglion cyst removal Bilateral    NERVE SURGERY  05/2022   PARTIAL HYSTERECTOMY     POLYPECTOMY     Social History:   reports that she has quit smoking. Her smoking use included cigarettes. She has never used smokeless tobacco. She reports that she does not currently use alcohol. She reports that she does not use drugs.  Family History  Problem Relation Age of Onset   Congestive Heart Failure Mother    Heart disease Father    Heart attack Father    Stroke Sister    Breast cancer Sister    Heart disease Sister    Heart disease Brother    Heart disease Brother    Heart disease Brother    Colon cancer Neg Hx    Colon polyps Neg Hx     Medications: Patient's Medications  New Prescriptions   CITALOPRAM (CELEXA) 10 MG TABLET    Take 1 tablet (10 mg total) by mouth daily.  Previous Medications   ASPIRIN EC 81 MG TABLET    Take 1 tablet (81 mg total) by mouth daily. Swallow whole.   BIOTIN W/ VITAMINS C & E (HAIR/SKIN/NAILS PO)    Take 1 tablet by mouth daily.   GABAPENTIN (NEURONTIN) 300 MG CAPSULE    Take 1 or 2 pills p.o. 3 times a day (up to 5 a day)   HYDRALAZINE (APRESOLINE) 50 MG TABLET    TAKE ONE TABLET BY MOUTH TWICE DAILY   LAMOTRIGINE (LAMICTAL) 150 MG TABLET    Take 1 tablet (150 mg total) by mouth 3 (three) times daily.   LATANOPROST (XALATAN) 0.005 % OPHTHALMIC SOLUTION    Place 1 drop into both eyes at bedtime.   LOSARTAN (COZAAR) 50 MG TABLET    Take 0.5 tablets (25 mg total) by mouth daily.   MECLIZINE (ANTIVERT) 25 MG  TABLET    Take 25 mg by mouth daily.   METOPROLOL SUCCINATE (TOPROL XL) 25 MG 24 HR TABLET    Take 1 tablet (25 mg total) by mouth daily.   MULTIPLE VITAMIN (MULTIVITAMIN) TABLET    Take 1 tablet by mouth daily.   TIMOLOL (TIMOPTIC) 0.5 % OPHTHALMIC SOLUTION    Place 1 drop into both eyes 2 (  two) times daily.  Modified Medications   No medications on file  Discontinued Medications   CITALOPRAM (CELEXA) 20 MG TABLET    Take 1 tablet (20 mg total) by mouth daily.    Physical Exam:  Vitals:   06/17/22 0945  BP: 122/78  Pulse: (!) 55  Temp: (!) 97.5 F (36.4 C)  TempSrc: Temporal  SpO2: 96%  Weight: 133 lb (60.3 kg)  Height: 5\' 3"  (1.6 m)   Body mass index is 23.56 kg/m. Wt Readings from Last 3 Encounters:  06/17/22 133 lb (60.3 kg)  02/04/22 130 lb (59 kg)  01/15/22 125 lb (56.7 kg)    Physical Exam Vitals reviewed.  Constitutional:      General: She is not in acute distress.    Appearance: Normal appearance.  Cardiovascular:     Rate and Rhythm: Normal rate and regular rhythm.  Pulmonary:     Effort: No respiratory distress.     Breath sounds: Normal breath sounds.  Abdominal:     General: Bowel sounds are normal. There is no distension.     Palpations: Abdomen is soft. There is no mass.     Tenderness: There is no abdominal tenderness. There is no guarding.  Musculoskeletal:     Cervical back: Neck supple.  Lymphadenopathy:     Cervical: No cervical adenopathy.  Skin:    General: Skin is warm and dry.  Neurological:     Mental Status: She is alert and oriented to person, place, and time.  Psychiatric:        Mood and Affect: Mood normal.     Labs reviewed: Basic Metabolic Panel: Recent Labs    10/01/21 0200 10/02/21 0253 10/03/21 0222 10/04/21 0352 01/16/22 0514 01/17/22 0351 01/19/22 0229  NA 134* 132* 134*   < > 135 134* 137  K 3.9 3.9 3.8   < > 4.0 3.5 4.2  CL 96* 99 101   < > 102 100 102  CO2 24 28 26    < > 24 26 26   GLUCOSE 81 98 98   < >  113* 88 85  BUN 18 14 15    < > 9 10 12   CREATININE 0.84 0.76 0.68   < > 0.86 0.75 0.72  CALCIUM 9.3 9.2 9.1   < > 9.1 8.8* 9.1  MG 1.9 2.1 1.9  --   --   --   --   PHOS 3.3 3.0 3.1  --   --  3.1 3.4   < > = values in this interval not displayed.   Liver Function Tests: Recent Labs    10/02/21 0253 10/03/21 0222 01/15/22 1640  AST 28 34 22  ALT 21 29 14   ALKPHOS 106 106 91  BILITOT 0.6 0.6 0.4  PROT 6.4* 6.3* 6.6  ALBUMIN 3.6 3.4* 3.9   Recent Labs    09/27/21 1300  LIPASE 26   No results for input(s): "AMMONIA" in the last 8760 hours. CBC: Recent Labs    10/02/21 0253 10/03/21 0222 01/15/22 1640 01/15/22 1644 01/16/22 0514 01/17/22 0351  WBC 6.6 6.3 4.8  --  7.0 5.0  NEUTROABS 3.3 3.4 2.9  --   --   --   HGB 12.8 12.6 12.7 13.3 12.3 11.9*  HCT 38.1 37.2 37.4 39.0 37.5 34.8*  MCV 94.8 94.9 93.3  --  95.4 92.6  PLT 289 278 205  --  201 191   Lipid Panel: Recent Labs    01/16/22 0514  CHOL  250*  HDL 104  LDLCALC 139*  TRIG 34  CHOLHDL 2.4   TSH: No results for input(s): "TSH" in the last 8760 hours. A1C: Lab Results  Component Value Date   HGBA1C 5.4 01/16/2022     Assessment/Plan 1. Dysuria Pt denies current symptoms but was concerned about the color of her urine. Reports mild dysuria months ago.  - POC Urinalysis Dipstick showing leukocytosis noted to urine, will send for urine.  To increase hydration  - Urine Culture  2. Primary hypertension Stable on hydralazine, losartan, and metoprolol. Encourage low-sodium diet.  Continue current regimen with dietary modifications.  - CBC with Differential/Platelet - Complete Metabolic Panel with eGFR  3. Trigeminal neuralgia Stable. Following with neurology  4. Hypercholesterolemia Elevated in November while she was hospitalized. F/u today - Lipid panel  5. Anxiety Restart celexa, patient was doing well with this medication Start half tablet for 2 weeks then increase to whole  - citalopram  (CELEXA) 10 MG tablet; Take 1 tablet (10 mg total) by mouth daily.  Dispense: 30 tablet; Refill: 3  6. Aortic atherosclerosis Noted on imaging. Continues ASA   7. Multiple sclerosis Stable. Follows with neurology.   Return in about 4 months (around 10/17/2022) for routine follow up .  Student- Archer Asa O'Berry ACPCNP-S  I personally was present during the history, physical exam and medical decision-making activities of this service and have verified that the service and findings are accurately documented in the student's note Crystal Huber K. [place], [facility] [phone]

## 2022-06-18 ENCOUNTER — Other Ambulatory Visit: Payer: Self-pay | Admitting: Nurse Practitioner

## 2022-06-18 DIAGNOSIS — E7800 Pure hypercholesterolemia, unspecified: Secondary | ICD-10-CM

## 2022-06-18 LAB — COMPLETE METABOLIC PANEL WITH GFR
AG Ratio: 1.5 (calc) (ref 1.0–2.5)
ALT: 17 U/L (ref 6–29)
AST: 21 U/L (ref 10–35)
Albumin: 4.3 g/dL (ref 3.6–5.1)
Alkaline phosphatase (APISO): 93 U/L (ref 37–153)
BUN: 15 mg/dL (ref 7–25)
CO2: 30 mmol/L (ref 20–32)
Calcium: 10.1 mg/dL (ref 8.6–10.4)
Chloride: 100 mmol/L (ref 98–110)
Creat: 0.73 mg/dL (ref 0.60–1.00)
Globulin: 2.8 g/dL (calc) (ref 1.9–3.7)
Glucose, Bld: 86 mg/dL (ref 65–139)
Potassium: 4.5 mmol/L (ref 3.5–5.3)
Sodium: 137 mmol/L (ref 135–146)
Total Bilirubin: 0.4 mg/dL (ref 0.2–1.2)
Total Protein: 7.1 g/dL (ref 6.1–8.1)
eGFR: 85 mL/min/{1.73_m2} (ref >=60)

## 2022-06-18 LAB — CBC WITH DIFFERENTIAL/PLATELET
Absolute Monocytes: 653 cells/uL (ref 200–950)
Basophils Absolute: 61 cells/uL (ref 0–200)
Basophils Relative: 1.2 %
Eosinophils Absolute: 92 cells/uL (ref 15–500)
Eosinophils Relative: 1.8 %
HCT: 39.7 % (ref 35.0–45.0)
Hemoglobin: 13.2 g/dL (ref 11.7–15.5)
Lymphs Abs: 1591.2 cells/uL (ref 850–3900)
MCH: 31 pg (ref 27.0–33.0)
MCHC: 33.2 g/dL (ref 32.0–36.0)
MCV: 93.2 fL (ref 80.0–100.0)
MPV: 10.2 fL (ref 7.5–12.5)
Monocytes Relative: 12.8 %
Neutro Abs: 2703 cells/uL (ref 1500–7800)
Neutrophils Relative %: 53 %
Platelets: 263 10*3/uL (ref 140–400)
RBC: 4.26 10*6/uL (ref 3.80–5.10)
RDW: 12.8 % (ref 11.0–15.0)
Total Lymphocyte: 31.2 %
WBC: 5.1 10*3/uL (ref 3.8–10.8)

## 2022-06-18 LAB — URINE CULTURE
MICRO NUMBER:: 14767058
Result:: NO GROWTH
SPECIMEN QUALITY:: ADEQUATE

## 2022-06-18 LAB — LIPID PANEL
Cholesterol: 273 mg/dL — ABNORMAL HIGH (ref <200)
HDL: 106 mg/dL (ref >=50)
LDL Cholesterol (Calc): 151 mg/dL (calc) — ABNORMAL HIGH
Non-HDL Cholesterol (Calc): 167 mg/dL (calc) — ABNORMAL HIGH (ref <130)
Total CHOL/HDL Ratio: 2.6 (calc) (ref <5.0)
Triglycerides: 56 mg/dL (ref <150)

## 2022-06-18 MED ORDER — EZETIMIBE 10 MG PO TABS: 10.0000 mg | ORAL_TABLET | Freq: Every day | ORAL | 3 refills | Status: DC

## 2022-06-19 ENCOUNTER — Telehealth: Payer: Self-pay

## 2022-06-19 NOTE — Telephone Encounter (Signed)
Patient called to say she was recently seen and forgot to tell [name], NP about tingling in foot when setting. Patient states tingling is more noticeable in the evening. Patient has a pending appointment with her MS doctor in May however she did not know if Janett Billow could make a recommendation in the interim. Patient states " I hope I don't need to be seen again to discuss this."

## 2022-06-20 DIAGNOSIS — S82209A Unspecified fracture of shaft of unspecified tibia, initial encounter for closed fracture: Secondary | ICD-10-CM | POA: Diagnosis not present

## 2022-06-20 NOTE — Telephone Encounter (Signed)
Detailed message left on voicemail with Jessica's reply. Patient was instructed to call the office for an appointment.

## 2022-06-20 NOTE — Telephone Encounter (Signed)
Could be due to several things, MS included. Would be best to have a visit with a provider to discuss. If causing a lot of pain she can follow up prior to her neurology appt.

## 2022-06-24 ENCOUNTER — Ambulatory Visit: Payer: Medicare Other | Admitting: Neurology

## 2022-06-24 DIAGNOSIS — H59211 Accidental puncture and laceration of right eye and adnexa during an ophthalmic procedure: Secondary | ICD-10-CM | POA: Diagnosis not present

## 2022-06-24 DIAGNOSIS — H2511 Age-related nuclear cataract, right eye: Secondary | ICD-10-CM | POA: Diagnosis not present

## 2022-06-24 DIAGNOSIS — H401112 Primary open-angle glaucoma, right eye, moderate stage: Secondary | ICD-10-CM | POA: Diagnosis not present

## 2022-06-25 DIAGNOSIS — Z961 Presence of intraocular lens: Secondary | ICD-10-CM | POA: Diagnosis not present

## 2022-06-25 DIAGNOSIS — H401132 Primary open-angle glaucoma, bilateral, moderate stage: Secondary | ICD-10-CM | POA: Diagnosis not present

## 2022-07-02 DIAGNOSIS — Z961 Presence of intraocular lens: Secondary | ICD-10-CM | POA: Diagnosis not present

## 2022-07-02 DIAGNOSIS — H401132 Primary open-angle glaucoma, bilateral, moderate stage: Secondary | ICD-10-CM | POA: Diagnosis not present

## 2022-07-02 DIAGNOSIS — H4311 Vitreous hemorrhage, right eye: Secondary | ICD-10-CM | POA: Diagnosis not present

## 2022-07-02 DIAGNOSIS — T8529XS Other mechanical complication of intraocular lens, sequela: Secondary | ICD-10-CM | POA: Diagnosis not present

## 2022-07-16 ENCOUNTER — Other Ambulatory Visit: Payer: Self-pay | Admitting: Neurology

## 2022-07-17 NOTE — Telephone Encounter (Signed)
Last seen 02/04/22 per note "Continue Lamotrigine 150 mg po tid and gabapentin at higher dose for Trigeminal neuralgia. "  Follow up visit scheduled on 08/07/22  Last filled on 02/15/22 #84 tablets (28 day supply)

## 2022-07-20 DIAGNOSIS — S82209A Unspecified fracture of shaft of unspecified tibia, initial encounter for closed fracture: Secondary | ICD-10-CM | POA: Diagnosis not present

## 2022-07-25 DIAGNOSIS — H35361 Drusen (degenerative) of macula, right eye: Secondary | ICD-10-CM | POA: Diagnosis not present

## 2022-07-25 DIAGNOSIS — T8529XS Other mechanical complication of intraocular lens, sequela: Secondary | ICD-10-CM | POA: Diagnosis not present

## 2022-07-25 DIAGNOSIS — H4311 Vitreous hemorrhage, right eye: Secondary | ICD-10-CM | POA: Diagnosis not present

## 2022-08-07 ENCOUNTER — Encounter: Payer: Self-pay | Admitting: Neurology

## 2022-08-07 ENCOUNTER — Ambulatory Visit: Payer: Medicare Other | Admitting: Neurology

## 2022-08-07 VITALS — BP 119/60 | HR 52 | Ht 63.5 in | Wt 135.0 lb

## 2022-08-07 DIAGNOSIS — G35 Multiple sclerosis: Secondary | ICD-10-CM | POA: Diagnosis not present

## 2022-08-07 DIAGNOSIS — F413 Other mixed anxiety disorders: Secondary | ICD-10-CM

## 2022-08-07 DIAGNOSIS — R269 Unspecified abnormalities of gait and mobility: Secondary | ICD-10-CM

## 2022-08-07 DIAGNOSIS — G35D Multiple sclerosis, unspecified: Secondary | ICD-10-CM

## 2022-08-07 DIAGNOSIS — G500 Trigeminal neuralgia: Secondary | ICD-10-CM

## 2022-08-07 NOTE — Progress Notes (Signed)
[facility]  PATIENT: Crystal Huber DOB: Dec 22, 1945  REFERRING DOCTOR OR PCP:  Burnett Kanaris SOURCE: Patient  _________________________________   HISTORICAL  CHIEF COMPLAINT:  Chief Complaint  Patient presents with   Follow-up    Pt in room 10, CNA in room pt.. Here MS follow up, pt reports doing good, uses cane, balance is still off some. No recent falls.  Still has dizziness at times, pt mainly noticed when using eye drops looking up. Has life alert when cna is not around.     HISTORY OF PRESENT ILLNESS:  Crystal Huber is a 77 y.o. woman with relapsing remitting MS diagnosed in 2002.  Update  08/07/2022 Her MS is stable.   In 2022, we stopped the Rebif and she feels better off the medication.  She feels her MS is mostly stable.    She has vertigo - no worse and maybe a little better than last visit.  .Bending over and back up may trigger a spell so she is careful.   She was doing exercises learned in PT but has not done lately.  She sometimes takes meclizine but not sure if it helps.     Her shower has grab bar and is a walk in.  Her gait is off balanced, She uses a cane more the past year.   On a good day, she can walk a few hundred feet maybe up to a quarter mile.    She denies much weakness or numbness.  Trigeminal neuralgia  much better since gamma knife.  She is on Lamotrigine 150 mg po tid and gabapentin 300 mg po tid usually but up to 5 during a flare (she just started higher dose).     In the past, she had an [facility] [place]) with  benefit x 1 month, last done about 1 years ago (Dr. Marca Ancona).   She takes baclofen at night as needed as she is sleepy/groggy in the morning if she takes daily.   Carbamezapine caused hyponatremia.   She had gamma knife at [facility] in February 2024 (Dr. Angelyn Punt and Dr. Johny Drilling) and she feels a lot of benefit.  Before the gamma knife she had a thin section MRI showing a T1 hypodensity near  the left trigeminal entry zone.  She has mild cognitive issues similar to earlier this year.   She scored.  Short-term memory issues bother her the most..  Usually she does better with hints.   She is not driving. .   Mood is doing well.  Notes feeling sad but no definite depression. She has some anxiety.   She feels less anxiety now that she has a roommate.  She sleeps well most nights.    Labs 03/03/2020 showed low Vit D (24) and she takes supplements.   TSH was fine.  B12 was normal.   LFT and Lymphocytes were fine   `     02/04/2022    1:37 PM 03/27/2020    2:03 PM 03/27/2020    1:40 PM  MMSE - Mini Mental State Exam  Orientation to time 5 3 3   Orientation to Place 5 5 5   Registration 3 3 3   Attention/ Calculation 1 2 2   Recall 3 3 3   Language- name 2 objects 2 2 2   Language- repeat 1 1 1   Language- follow 3 step command 3 3 3   Language- read & follow direction 1 1 1   Write a sentence 1 1 1  Copy design 1 1 1   Total score 26 25 25    02/04/22: She lost the 4 points due to serial sevens.  She would have scored 30/30 spelling world backwards.    MS History:   She had MRI and LP consistent with the diagnosis of MS in 2002 after presenting with trigeminal neuralgia.   She was started on Rebif.   She is on Rebif 22 mcg, tolerates it well and has had no definite exacerbation since.       MRI of the brain 09/19/2016 shows classic MS lesions.  There were a few foci not present in 2012.  MRI of the brain 12/21/2020 showed no new lesions.   REVIEW OF SYSTEMS: Constitutional: No fevers, chills, sweats, or change in appetite.   Fatigue, worse as the day goes on. Eyes: No visual changes, double vision, eye pain Ear, nose and throat: Has vertigo, No hearing loss, ear pain, nasal congestion, sore throat Cardiovascular: No chest pain, palpitations Respiratory:  No shortness of breath at rest or with exertion.   No wheezes GastrointestinaI: No nausea, vomiting, diarrhea, abdominal pain, fecal  incontinence Genitourinary:  No dysuria, urinary retention or frequency.  No nocturia. Musculoskeletal:  No neck pain, back pain Integumentary: No rash, pruritus, skin lesions Neurological: as above Psychiatric: Some depression and anxiety. Endocrine: No palpitations, diaphoresis, change in appetite, change in weigh or increased thirst Hematologic/Lymphatic:  No anemia, purpura, petechiae..   Bruises easily Allergic/Immunologic: No itchy/runny eyes, nasal congestion, recent allergic reactions, rashes  ALLERGIES: Allergies  Allergen Reactions   Carbamazepine     Decreased sodium level   Imipramine     Night sweats, more intense dreams   Metronidazole Nausea Only   Oxcarbazepine     Decreased sodium level   Penicillins Swelling    Yeast infection   Red Blood Cells Other (See Comments)    Jehovah's witness, does not want blood products   Statins     Gets hyponatremia and severe muscle pain   Sulfa Antibiotics Swelling    yeast infection    HOME MEDICATIONS:  Current Outpatient Medications:    Biotin w/ Vitamins C & E (HAIR/SKIN/NAILS PO), Take 1 tablet by mouth daily., Disp: , Rfl:    citalopram (CELEXA) 10 MG tablet, Take 1 tablet (10 mg total) by mouth daily., Disp: 30 tablet, Rfl: 3   ezetimibe (ZETIA) 10 MG tablet, Take 1 tablet (10 mg total) by mouth daily., Disp: 90 tablet, Rfl: 3   gabapentin (NEURONTIN) 300 MG capsule, Take 1 or 2 pills p.o. 3 times a day (up to 5 a day), Disp: 450 capsule, Rfl: 3   hydrALAZINE (APRESOLINE) 50 MG tablet, TAKE ONE TABLET BY MOUTH TWICE DAILY, Disp: 180 tablet, Rfl: 1   lamoTRIgine (LAMICTAL) 150 MG tablet, TAKE ONE TABLET BY MOUTH THREE TIMES DAILY, Disp: 270 tablet, Rfl: 0   latanoprost (XALATAN) 0.005 % ophthalmic solution, Place 1 drop into both eyes at bedtime., Disp: , Rfl:    losartan (COZAAR) 50 MG tablet, Take 0.5 tablets (25 mg total) by mouth daily., Disp: 45 tablet, Rfl: 1   meclizine (ANTIVERT) 25 MG tablet, Take 25 mg by  mouth daily., Disp: , Rfl:    metoprolol succinate (TOPROL XL) 25 MG 24 hr tablet, Take 1 tablet (25 mg total) by mouth daily., Disp: , Rfl: 0   Multiple Vitamin (MULTIVITAMIN) tablet, Take 1 tablet by mouth daily., Disp: , Rfl:    timolol (TIMOPTIC) 0.5 % ophthalmic solution, Place 1 drop into  both eyes 2 (two) times daily., Disp: , Rfl:    aspirin EC 81 MG tablet, Take 1 tablet (81 mg total) by mouth daily. Swallow whole. (Patient not taking: Reported on 08/07/2022), Disp: 30 tablet, Rfl: 12  PAST MEDICAL HISTORY: Past Medical History:  Diagnosis Date   Arthritis    right pinkie   Cancer (HCC)    right BR  CA    Cataract    right eye   GERD (gastroesophageal reflux disease)    prn tums, mild not often   Glaucoma    Hyperlipidemia    Hypertension    Multiple sclerosis (HCC)    Neuromuscular disorder (HCC)    trigeminal neuralgia   Refusal of blood product    Trigeminal neuralgia of left side of face     PAST SURGICAL HISTORY: Past Surgical History:  Procedure Laterality Date   BREAST LUMPECTOMY Right    wears prosthesis   CATARACT EXTRACTION Left 05/2022   COLONOSCOPY  12/20/2010   ganglion cyst removal Bilateral    NERVE SURGERY  05/2022   PARTIAL HYSTERECTOMY     POLYPECTOMY      FAMILY HISTORY: Family History  Problem Relation Age of Onset   Congestive Heart Failure Mother    Heart disease Father    Heart attack Father    Stroke Sister    Breast cancer Sister    Heart disease Sister    Heart disease Brother    Heart disease Brother    Heart disease Brother    Colon cancer Neg Hx    Colon polyps Neg Hx     SOCIAL HISTORY:  Social History   Socioeconomic History   Marital status: Widowed    Spouse name: Not on file   Number of children: Not on file   Years of education: Not on file   Highest education level: Not on file  Occupational History   Not on file  Tobacco Use   Smoking status: Former    Years: 1.5    Types: Cigarettes   Smokeless  tobacco: Never   Tobacco comments:    Quit smoking at age 30 and only smoke occasional x 1.5 years   Vaping Use   Vaping Use: Never used  Substance and Sexual Activity   Alcohol use: Not Currently   Drug use: No   Sexual activity: Not on file  Other Topics Concern   Not on file  Social History Narrative   Diet      Do you drink/eat things with caffeine      Marital Status     What year were you married?      Do you live in a house, apartment, assisted living, condo, trailer, etc.?      Is it one or more stories?      How many persons live in your home?         Do you have any pets in your home?(please list)      Highest level of education completed:      Current or past profession:      Do you exercise?:    Type and how often:      Do you have a Living Will? (Form that indicates scenarios where you would not want your life prolonged)      Do you have a DNR form?         If not, would you like to discuss one?      Do you have  signed POA/HPOA forms? Yes      Do you have difficulty bathing or dressing yourself?      Do you have difficulty preparing food or eating?      Do you have difficulty managing medications?      Do you have difficulty managing your finances?      Do you have difficulty affording your medications?                     Social Determinants of Health   Financial Resource Strain: Not on file  Food Insecurity: No Food Insecurity (01/16/2022)   Hunger Vital Sign    Worried About Running Out of Food in the Last Year: Never true    Ran Out of Food in the Last Year: Never true  Transportation Needs: No Transportation Needs (01/16/2022)   PRAPARE - Administrator, Civil Service (Medical): No    Lack of Transportation (Non-Medical): No  Physical Activity: Not on file  Stress: Not on file  Social Connections: Not on file  Intimate Partner Violence: Not At Risk (01/16/2022)   Humiliation, Afraid, Rape, and Kick questionnaire    Fear  of Current or Ex-Partner: No    Emotionally Abused: No    Physically Abused: No    Sexually Abused: No     PHYSICAL EXAM  Vitals:   08/07/22 1121  BP: 119/60  Pulse: (!) 52  Weight: 135 lb (61.2 kg)  Height: 5' 3.5" (1.613 m)    Body mass index is 23.54 kg/m.    General: The patient is well-developed and well-nourished and in no acute distress  Neck:  The neck has good range of motion.  Neurologic Exam  Mental status: The patient is alert and oriented x 3 at the time of the examination. The patient has apparent normal recent and remote memory, with an apparently normal attention span and concentration ability.   Speech is normal.  Cranial nerves: Extraocular movements are full. No nystagmus.  Facial strength and sensation was normal.  She has reduced left-sided hearing.. The Weber does not lateralize.   Motor: Tremor not notable. Muscle bulk is normal.    normal muscle tone in the arms strength is  5 / 5 in all 4 extremities.   Sensory: She has normal symmetric sensation to touch and vibration.  Coordination: Cerebellar testing shows good finger nose finger and heel to shin.   Gait and station: Station is normal.  She has a wide gait without cane, better with cane.  The tandem is wide but probably better than last visit..   Romberg is negative...    Reflexes: Deep tendon reflexes are symmetric and normal bilaterally.        ASSESSMENT AND PLAN  Multiple sclerosis (HCC)  Trigeminal neuralgia  Gait disorder  Other mixed anxiety disorders   1.   Remain off the Rebif.  Sometime next year we will check another MRI of the brain to ensure that there has been stability.  If progression has occurred we would need to reconsider restarting a disease modifying therapy. 2.   TN pain is better since gamma knife and we should be able to reduce the medications.   Reduce Lamotrigine to 150 mg po bid and gabapentin 300 mg tid  for Trigeminal neuralgia.     Likely due to the MS as  no definite vascular loop seen on MRI.    Might be able to drop further 3.   Cognition is stable.  Advised not to drive.  She has an aide 4.   Continue to be active.  Gait is stable.   Continue vitamin D supplements  Rtc 6 months, sooner if problems   This visit is part of a comprehensive longitudinal care medical relationship regarding the patients primary diagnosis of multiple sclerosis and related concerns.   [name] A. Epimenio Foot, MD, PhD 08/07/2022, 2:27 PM Certified in Neurology, Clinical Neurophysiology, Sleep Medicine, Pain Medicine and Neuroimaging  [facility] [address] [place], [place] [address] [phone]

## 2022-08-07 NOTE — Patient Instructions (Signed)
Reduce lamotrigine to one pill twice a day  Reduce gabapentin to one pill three times a day.

## 2022-08-14 DIAGNOSIS — H35361 Drusen (degenerative) of macula, right eye: Secondary | ICD-10-CM | POA: Diagnosis not present

## 2022-08-20 DIAGNOSIS — S82209A Unspecified fracture of shaft of unspecified tibia, initial encounter for closed fracture: Secondary | ICD-10-CM | POA: Diagnosis not present

## 2022-08-28 ENCOUNTER — Other Ambulatory Visit: Payer: Self-pay | Admitting: Nurse Practitioner

## 2022-08-28 ENCOUNTER — Other Ambulatory Visit: Payer: Self-pay | Admitting: Neurology

## 2022-08-29 ENCOUNTER — Other Ambulatory Visit: Payer: Self-pay

## 2022-08-30 DIAGNOSIS — G500 Trigeminal neuralgia: Secondary | ICD-10-CM | POA: Diagnosis not present

## 2022-09-06 ENCOUNTER — Other Ambulatory Visit: Payer: Self-pay | Admitting: Nurse Practitioner

## 2022-09-06 NOTE — Telephone Encounter (Signed)
Patient called and stated she have been taking losartan 50 mg and not 25 mg daily because she didn't realize it was half a pill until needing a refill. She have not checked blood pressure and stated that she feels fine but want to know if she should just start taking half tablet or continue with the whole tablet. Please advise

## 2022-09-06 NOTE — Telephone Encounter (Signed)
Patient was advised and verbalized understanding. 

## 2022-09-06 NOTE — Telephone Encounter (Signed)
Take medication as prescribed.

## 2022-09-26 ENCOUNTER — Other Ambulatory Visit: Payer: Self-pay

## 2022-09-26 MED ORDER — METOPROLOL SUCCINATE ER 25 MG PO TB24: 25.0000 mg | ORAL_TABLET | Freq: Every day | ORAL | Status: DC

## 2022-09-26 NOTE — Telephone Encounter (Signed)
Patient called requesting refill on metoprolol. Medication was last refilled 01/23/22 for a 30 day supply with no refills by a different provider. Patient states that she has about 5 pills left.  Medication has been pended and sent to Abbey Chatters, NP  to approve/refuse.

## 2022-09-27 DIAGNOSIS — Z961 Presence of intraocular lens: Secondary | ICD-10-CM | POA: Diagnosis not present

## 2022-09-27 DIAGNOSIS — H401132 Primary open-angle glaucoma, bilateral, moderate stage: Secondary | ICD-10-CM | POA: Diagnosis not present

## 2022-10-14 ENCOUNTER — Ambulatory Visit (INDEPENDENT_AMBULATORY_CARE_PROVIDER_SITE_OTHER): Payer: Medicare Other | Admitting: Nurse Practitioner

## 2022-10-14 ENCOUNTER — Encounter: Payer: Self-pay | Admitting: Nurse Practitioner

## 2022-10-14 VITALS — BP 118/74 | HR 56 | Temp 97.1°F | Ht 63.5 in | Wt 138.0 lb

## 2022-10-14 DIAGNOSIS — I10 Essential (primary) hypertension: Secondary | ICD-10-CM

## 2022-10-14 DIAGNOSIS — Z1212 Encounter for screening for malignant neoplasm of rectum: Secondary | ICD-10-CM | POA: Diagnosis not present

## 2022-10-14 DIAGNOSIS — G35 Multiple sclerosis: Secondary | ICD-10-CM | POA: Diagnosis not present

## 2022-10-14 DIAGNOSIS — E7800 Pure hypercholesterolemia, unspecified: Secondary | ICD-10-CM

## 2022-10-14 DIAGNOSIS — Z1211 Encounter for screening for malignant neoplasm of colon: Secondary | ICD-10-CM | POA: Diagnosis not present

## 2022-10-14 DIAGNOSIS — F419 Anxiety disorder, unspecified: Secondary | ICD-10-CM

## 2022-10-14 DIAGNOSIS — G500 Trigeminal neuralgia: Secondary | ICD-10-CM | POA: Diagnosis not present

## 2022-10-14 DIAGNOSIS — I700 Atherosclerosis of aorta: Secondary | ICD-10-CM | POA: Diagnosis not present

## 2022-10-14 NOTE — Patient Instructions (Addendum)
To go to the pharmacy and get shingrix vaccine- have them fax [place] when you have [place].  TDAP vaccine is also due which you can get at the pharmacy.    To take ASA EC 81 mg daily (baby aspirin)    Vit d 2000 untis daily  Calcium 600 mg by mouth twice daily

## 2022-10-14 NOTE — Progress Notes (Signed)
Careteam: Patient Care Team: Sharon Seller, NP as PCP - General (Geriatric Medicine)  PLACE OF SERVICE:  [facility]  Advanced Directive information Does Patient Have a Medical Advance Directive?: Yes, Type of Advance Directive: Healthcare Power of [place];Living will, Does patient want to make changes to medical advance directive?: No - Patient declined  Allergies  Allergen Reactions   Carbamazepine     Decreased sodium level   Imipramine     Night sweats, more intense dreams   Metronidazole Nausea Only   Oxcarbazepine     Decreased sodium level   Penicillins Swelling    Yeast infection   Red Blood Cells Other (See Comments)    Jehovah's witness, does not want blood products   Statins     Gets hyponatremia and severe muscle pain   Sulfa Antibiotics Swelling    yeast infection    Chief Complaint  Patient presents with   Medical Management of Chronic Issues    4 month follow-up. Discus need for shingrix, td/tdap, colonoscopy, and covid booster. Discuss ASA, patient is no longer taking. Patient thinks the zetia is making her hungry. Discuss need for multivitamin. Moderate fall risk      HPI: Patient is a 77 y.o. female for routine follow up.   She was previously eating a lot of ice cream. She has been off ice cream for about 2 weeks.   Needs to make appt to follow up for colonoscopy due to hx of polyps   Stopped her ASA- unsure if she needed  She has had family hx of CAD and death from MI.   She is taking celexa 10 mg daily- doing well on this dose.  Will occasionally have a blue day but then she will move past it.   No recent falls  Has help 2 days a week  Review of Systems:  Review of Systems  Constitutional:  Negative for chills, fever and weight loss.  HENT:  Negative for tinnitus.   Respiratory:  Negative for cough, sputum production and shortness of breath.   Cardiovascular:  Negative for chest pain, palpitations and leg swelling.   Gastrointestinal:  Negative for abdominal pain, constipation, diarrhea and heartburn.  Genitourinary:  Negative for dysuria, frequency and urgency.  Musculoskeletal:  Negative for back pain, falls, joint pain and myalgias.  Skin: Negative.   Neurological:  Negative for dizziness and headaches.  Psychiatric/Behavioral:  Negative for depression and memory loss. The patient does not have insomnia.     Past Medical History:  Diagnosis Date   Arthritis    right pinkie   Cancer (HCC)    right BR  CA    Cataract    right eye   GERD (gastroesophageal reflux disease)    prn tums, mild not often   Glaucoma    Hyperlipidemia    Hypertension    Multiple sclerosis (HCC)    Neuromuscular disorder (HCC)    trigeminal neuralgia   Refusal of blood product    Trigeminal neuralgia of left side of face    Past Surgical History:  Procedure Laterality Date   BREAST LUMPECTOMY Right    wears prosthesis   CATARACT EXTRACTION Left 05/2022   COLONOSCOPY  12/20/2010   ganglion cyst removal Bilateral    NERVE SURGERY  05/2022   PARTIAL HYSTERECTOMY     POLYPECTOMY     Social History:   reports that she has quit smoking. Her smoking use included cigarettes. She has never used smokeless tobacco.  She reports current alcohol use. She reports that she does not use drugs.  Family History  Problem Relation Age of Onset   Congestive Heart Failure Mother    Heart disease Father    Heart attack Father    Stroke Sister    Breast cancer Sister    Heart disease Sister    Heart disease Brother    Heart disease Brother    Heart disease Brother    Colon cancer Neg Hx    Colon polyps Neg Hx     Medications: Patient's Medications  New Prescriptions   No medications on file  Previous Medications   ASPIRIN EC 81 MG TABLET    Take 1 tablet (81 mg total) by mouth daily. Swallow whole.   BIOTIN W/ VITAMINS C & E (HAIR/SKIN/NAILS PO)    Take 1 tablet by mouth daily.   CITALOPRAM (CELEXA) 10 MG TABLET     Take 1 tablet (10 mg total) by mouth daily.   EZETIMIBE (ZETIA) 10 MG TABLET    Take 1 tablet (10 mg total) by mouth daily.   GABAPENTIN (NEURONTIN) 300 MG CAPSULE    TAKE ONE OR TWO CAPSULES BY MOUTH THREE TIMES DAILY (UP TO 5 CAPSULES DAILY   HYDRALAZINE (APRESOLINE) 50 MG TABLET    TAKE ONE TABLET BY MOUTH TWICE DAILY   LAMOTRIGINE (LAMICTAL) 150 MG TABLET    TAKE ONE TABLET BY MOUTH THREE TIMES DAILY   LATANOPROST (XALATAN) 0.005 % OPHTHALMIC SOLUTION    Place 1 drop into both eyes at bedtime.   LOSARTAN (COZAAR) 50 MG TABLET    Take one-half tablet by mouth daily   MECLIZINE (ANTIVERT) 25 MG TABLET    Take 25 mg by mouth daily.   METOPROLOL SUCCINATE (TOPROL XL) 25 MG 24 HR TABLET    Take 1 tablet (25 mg total) by mouth daily.   MULTIPLE VITAMIN (MULTIVITAMIN) TABLET    Take 1 tablet by mouth daily.   TIMOLOL (TIMOPTIC) 0.5 % OPHTHALMIC SOLUTION    Place 1 drop into both eyes 2 (two) times daily.  Modified Medications   No medications on file  Discontinued Medications   No medications on file    Physical Exam:  Vitals:   10/14/22 0926  BP: 118/74  Pulse: (!) 56  Temp: (!) 97.1 F (36.2 C)  TempSrc: Temporal  SpO2: 94%  Weight: 138 lb (62.6 kg)  Height: 5' 3.5" (1.613 m)   Body mass index is 24.06 kg/m. Wt Readings from Last 3 Encounters:  10/14/22 138 lb (62.6 kg)  08/07/22 135 lb (61.2 kg)  06/17/22 133 lb (60.3 kg)    Physical Exam Constitutional:      General: She is not in acute distress.    Appearance: She is well-developed. She is not diaphoretic.  HENT:     Head: Normocephalic and atraumatic.     Mouth/Throat:     Pharynx: No oropharyngeal exudate.  Eyes:     Conjunctiva/sclera: Conjunctivae normal.     Pupils: Pupils are equal, round, and reactive to light.  Cardiovascular:     Rate and Rhythm: Normal rate and regular rhythm.     Heart sounds: Normal heart sounds.  Pulmonary:     Effort: Pulmonary effort is normal.     Breath sounds: Normal breath  sounds.  Abdominal:     General: Bowel sounds are normal.     Palpations: Abdomen is soft.  Musculoskeletal:     Cervical back: Normal range of motion and  neck supple.     Right lower leg: No edema.     Left lower leg: No edema.  Skin:    General: Skin is warm and dry.  Neurological:     Mental Status: She is alert and oriented to person, place, and time. Mental status is at baseline.     Gait: Gait abnormal (slow, uses walker).  Psychiatric:        Mood and Affect: Mood normal.     Labs reviewed: Basic Metabolic Panel: Recent Labs    01/17/22 0351 01/19/22 0229 06/17/22 1220  NA 134* 137 137  K 3.5 4.2 4.5  CL 100 102 100  CO2 26 26 30   GLUCOSE 88 85 86  BUN 10 12 15   CREATININE 0.75 0.72 0.73  CALCIUM 8.8* 9.1 10.1  PHOS 3.1 3.4  --    Liver Function Tests: Recent Labs    01/15/22 1640 06/17/22 1220  AST 22 21  ALT 14 17  ALKPHOS 91  --   BILITOT 0.4 0.4  PROT 6.6 7.1  ALBUMIN 3.9  --    No results for input(s): "LIPASE", "AMYLASE" in the last 8760 hours. No results for input(s): "AMMONIA" in the last 8760 hours. CBC: Recent Labs    01/15/22 1640 01/15/22 1644 01/16/22 0514 01/17/22 0351 06/17/22 1220  WBC 4.8  --  7.0 5.0 5.1  NEUTROABS 2.9  --   --   --  2,703  HGB 12.7   < > 12.3 11.9* 13.2  HCT 37.4   < > 37.5 34.8* 39.7  MCV 93.3  --  95.4 92.6 93.2  PLT 205  --  201 191 263   < > = values in this interval not displayed.   Lipid Panel: Recent Labs    01/16/22 0514 06/17/22 1220  CHOL 250* 273*  HDL 104 106  LDLCALC 139* 161*  TRIG 34 56  CHOLHDL 2.4 2.6   TSH: No results for input(s): "TSH" in the last 8760 hours. A1C: Lab Results  Component Value Date   HGBA1C 5.4 01/16/2022     Assessment/Plan 1. Encounter for colorectal cancer screening - Ambulatory referral to Gastroenterology  2. Hypercholesterolemia Has been on zetia, tolerating without adverse effects - Complete Metabolic Panel with eGFR - Lipid panel  3.  Primary hypertension -Blood pressure well controlled, goal bp <140/90 Continue current medications and dietary modifications follow metabolic panel  4. Trigeminal neuralgia -stable, continues to follow up with PT/OT  5. Aortic atherosclerosis (HCC) To restart ASA 81 mg daily Unable to tolerate statin Taking zetia at this time  6. Multiple sclerosis (HCC) Stable, followed by neurology  7. Anxiety -improved mood on celexa, continue at current dose.    Return in about 4 months (around 02/14/2023) for routine follow up .  Janene Harvey. Biagio Borg [facility] [phone]

## 2022-10-15 ENCOUNTER — Telehealth: Payer: Self-pay | Admitting: Gastroenterology

## 2022-10-15 NOTE — Telephone Encounter (Signed)
Inbound call from patient requesting to speak with a nurse in regards to scheduling for her procedure. Patient inquiring if it is still necessary for her to have procedure due to age and also states she has not had any complications over the years. Please advise.

## 2022-10-15 NOTE — Telephone Encounter (Signed)
Last colonoscopy was in 2018.

## 2022-10-15 NOTE — Telephone Encounter (Signed)
She was recommended to undergo surveillance colonoscopy due to history of colon adenomatous polyps.  Okay to schedule direct colonoscopy if patient is willing to proceed.  Thank you

## 2022-10-16 NOTE — Telephone Encounter (Signed)
Spoke with the patient and scheduled her screening colonoscopy

## 2022-10-21 ENCOUNTER — Other Ambulatory Visit: Payer: Self-pay | Admitting: Neurology

## 2022-10-21 ENCOUNTER — Telehealth: Payer: Self-pay | Admitting: Neurology

## 2022-10-21 NOTE — Telephone Encounter (Signed)
Called and left VM for CB.

## 2022-10-21 NOTE — Telephone Encounter (Signed)
Patient left a voicemail on my phone stating she wants a refill on lamotrigine but she wants it sent to a different pharmacy she did not leave that pharmacy information. She just requested a call back.

## 2022-10-21 NOTE — Telephone Encounter (Signed)
Last seen on 5/224/24 Follow up scheduled on 02/24/23

## 2022-10-22 ENCOUNTER — Other Ambulatory Visit: Payer: Self-pay

## 2022-10-22 MED ORDER — LAMOTRIGINE 150 MG PO TABS: 150.0000 mg | ORAL_TABLET | Freq: Two times a day (BID) | ORAL | 1 refills | Status: DC

## 2022-10-22 NOTE — Telephone Encounter (Signed)
Called patient and left vm informing her of medication refill being sent and to call with any other questions

## 2022-10-22 NOTE — Telephone Encounter (Signed)
Patient called back with pharmacy name.  [facility]  [address] [place], [place] [address] [phone]  Please give patient a call once rx is sent in. Patient also needs a call back regarding medication. States she is out of pills, took her last one this morning and normally takes three a day . Needs refill sent in to pick up today

## 2022-10-22 NOTE — Telephone Encounter (Signed)
I have called and lvm for CB, please see what pharmacy is needed if available later today

## 2022-10-23 ENCOUNTER — Telehealth: Payer: Self-pay | Admitting: Neurology

## 2022-10-23 NOTE — Telephone Encounter (Signed)
Pt is asking for a call to discuss going back to 3 a day on her  lamoTRIgine (LAMICTAL) 150 MG tablet , versus the 2 a day that Dr Epimenio Foot has taken her down to.

## 2022-10-23 NOTE — Telephone Encounter (Signed)
Return call to patient to discuss lamotrigine, no answer. Left message to call office

## 2022-10-24 ENCOUNTER — Encounter: Payer: Self-pay | Admitting: Neurology

## 2022-10-24 NOTE — Telephone Encounter (Signed)
Pt said, need information about Lamotrigine was filled for 2 times day but orginallly filled for 3 times day.

## 2022-10-24 NOTE — Telephone Encounter (Signed)
At 4:27 pm yesterday pt left a vm apologizing for missing Florentina Addison, RN call yesterday. Pt stated that she does not believe she can go down to 2 a day, please call pt back to discuss.

## 2022-10-24 NOTE — Telephone Encounter (Signed)
Call to patient, left message to call back and also stated she could send a mychart message due to playing phone tag. Will send to Dr. Epimenio Foot in the meantime.

## 2022-10-28 ENCOUNTER — Telehealth: Payer: Medicare Other | Admitting: *Deleted

## 2022-10-28 NOTE — Telephone Encounter (Signed)
Patient called requesting her Cholesterol Results she had back in July. Stated that she does not understand them on MyChart.  Lab results discuss with patient Patient agreed.

## 2022-10-29 DIAGNOSIS — H35361 Drusen (degenerative) of macula, right eye: Secondary | ICD-10-CM | POA: Diagnosis not present

## 2022-10-30 MED ORDER — LAMOTRIGINE 150 MG PO TABS: 150.0000 mg | ORAL_TABLET | Freq: Three times a day (TID) | ORAL | 0 refills | Status: DC

## 2022-10-30 NOTE — Telephone Encounter (Signed)
Pt said, would like Lamotrigine 3 times a day, can send prescription to [facility] [phone]

## 2022-11-12 IMAGING — CT CT CERVICAL SPINE W/O CM
3 of 4 series · 12 of 33 positions shown, 14 images · non-contrast
Comparison: 07/16/2020

CLINICAL DATA: Fall, history of multiple sclerosis



[Series 4: sagittal bone · sagittal · 0.23mm/px · 5 of 61 slices shown, 6 images]
[im 21/61  bone]
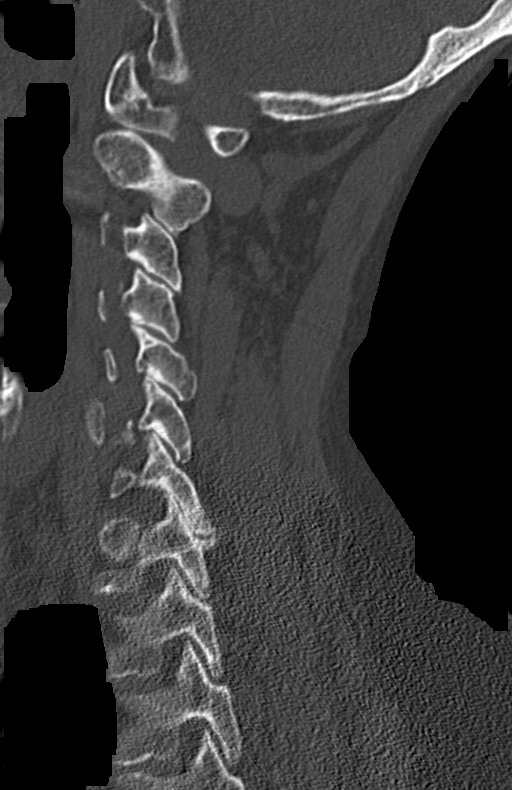
[im 26/61  bone]
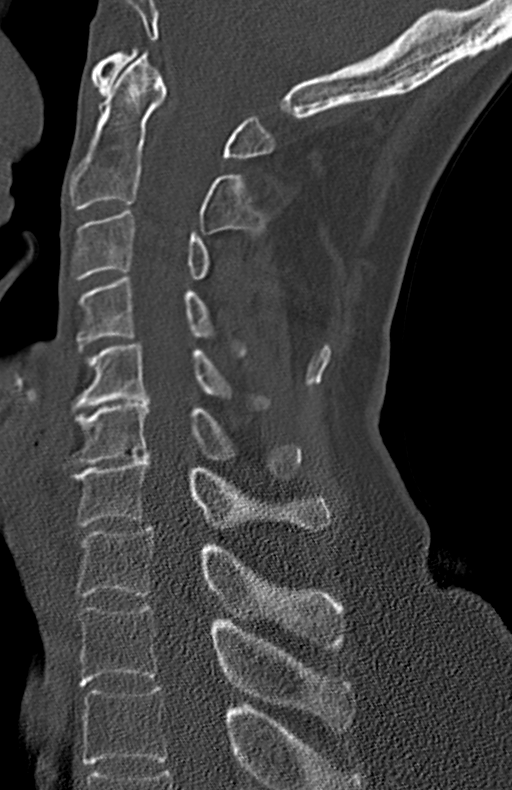
[im 31/61  soft-tissue]
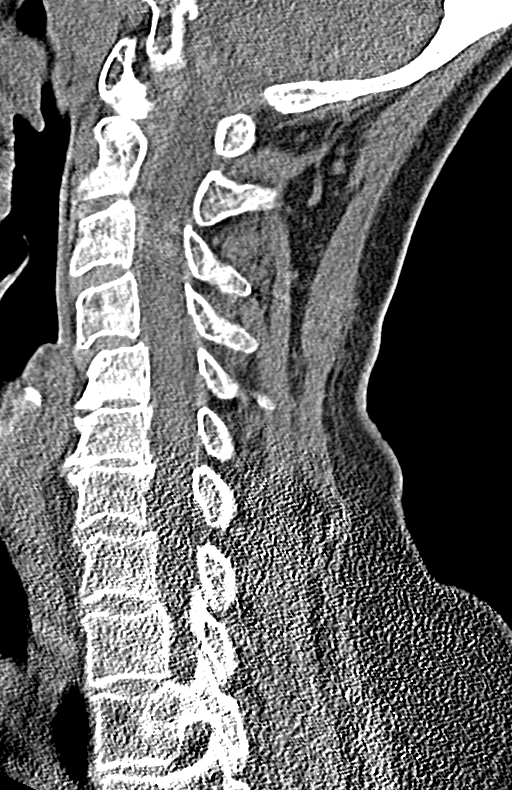
[im 31/61  bone]
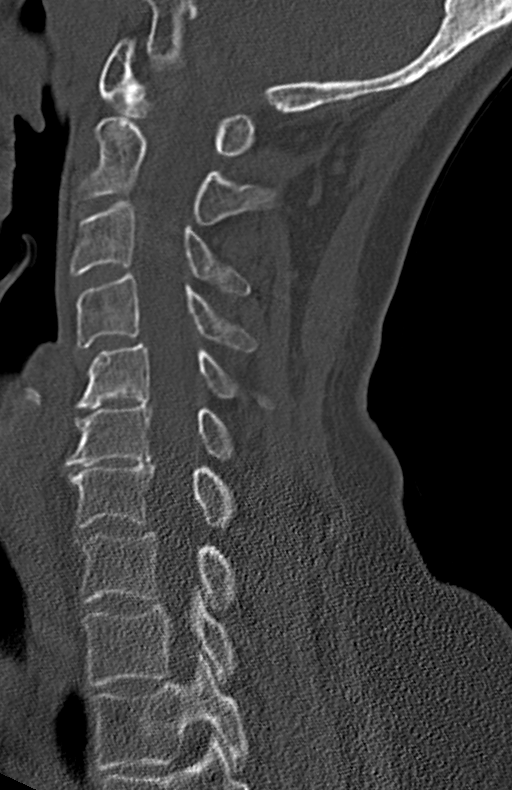
[im 36/61  bone]
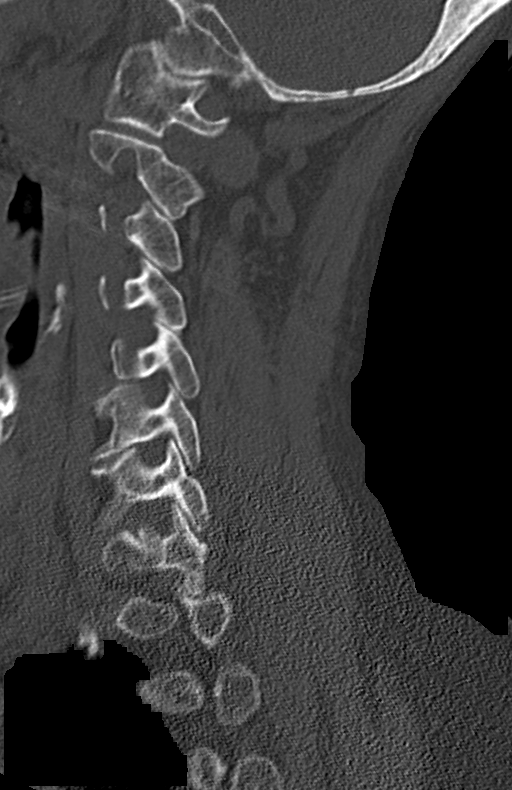
[im 41/61  bone]
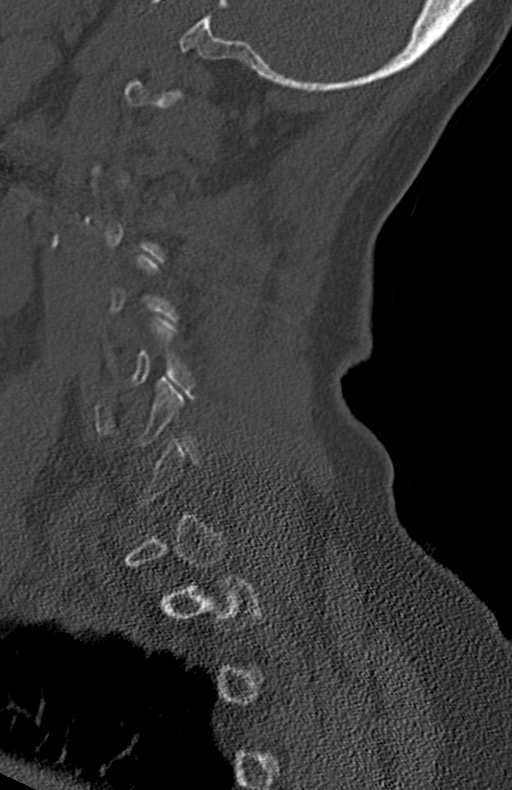

[Series 5: coronal bone · coronal · 0.25mm/px · 3 of 61 slices shown]
[im 13/61  bone]
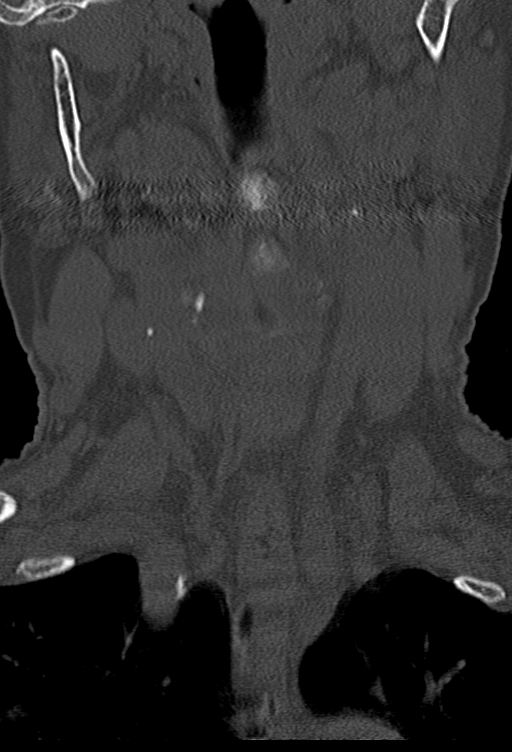
[im 25/61  bone]
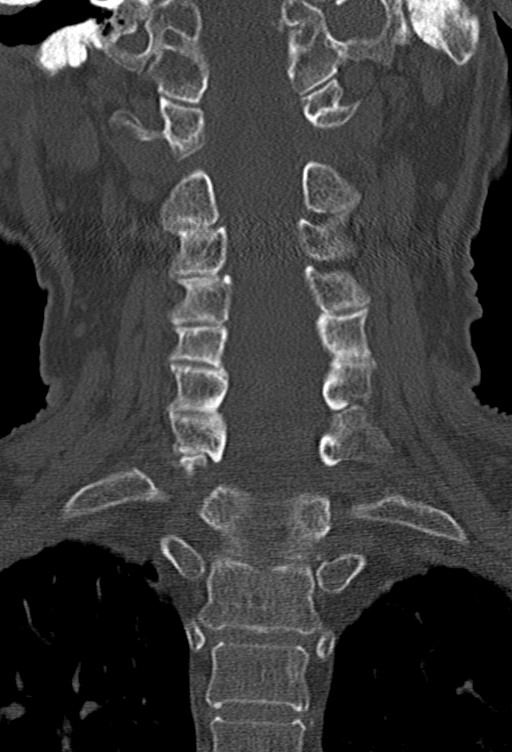
[im 37/61  bone]
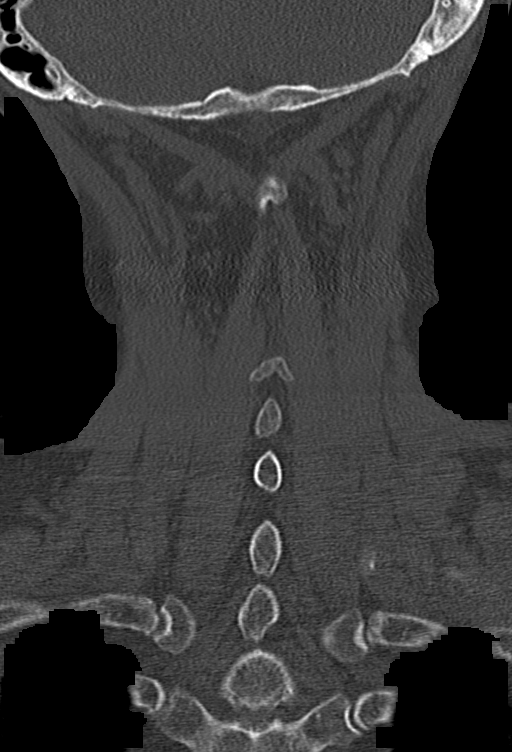

[Series 6: orthogonal axials · axial · 0.23mm/px · z∈[+834,+949]mm · 4 of 95 slices shown, 5 images]
[im 16/95  soft-tissue]
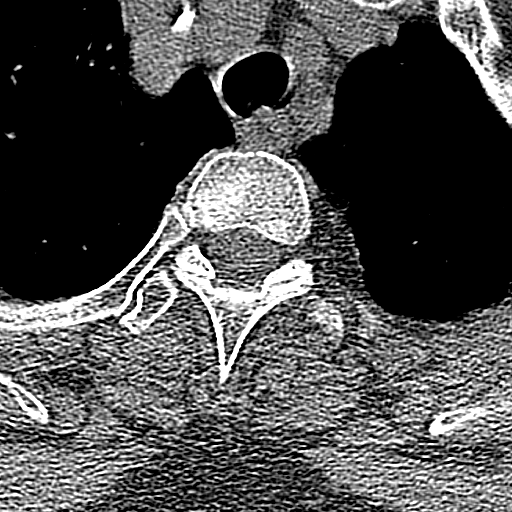
[im 16/95  bone]
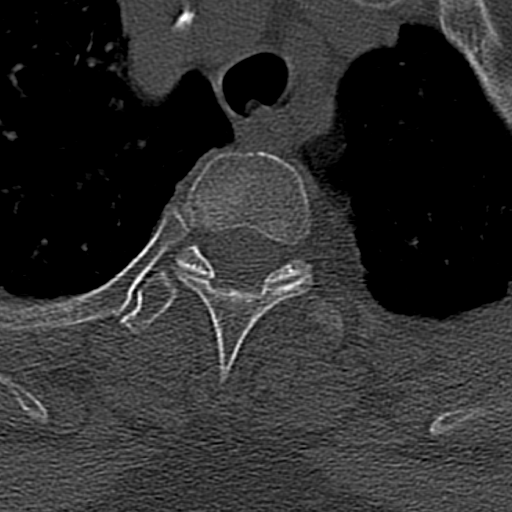
[im 32/95  bone]
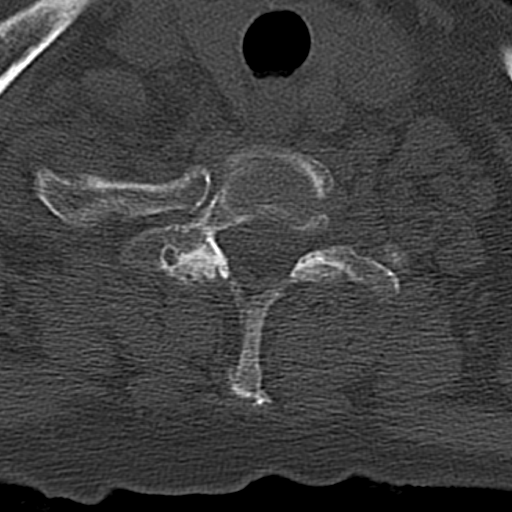
[im 63/95  bone]
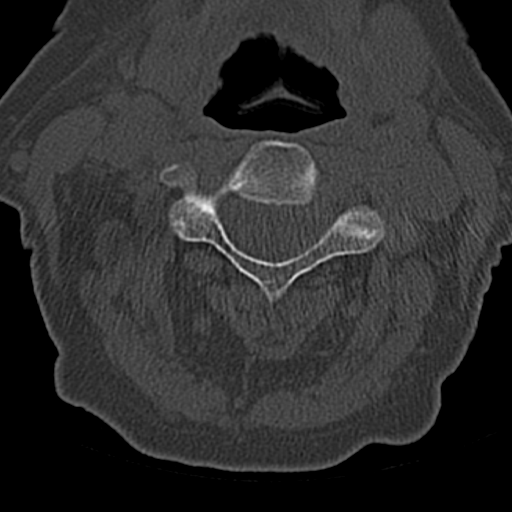
[im 79/95  bone]
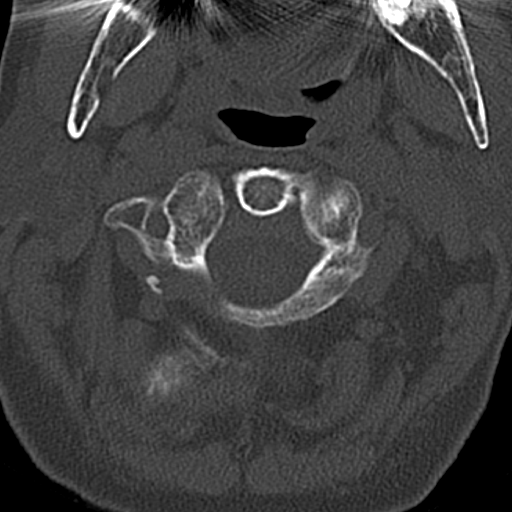

[12 of 33 positions shown; findings below may reference images not displayed]

FINDINGS: CT HEAD FINDINGS

Brain: No evidence of acute infarction, hemorrhage, hydrocephalus,
extra-axial collection or mass lesion/mass effect. Periventricular
and deep white matter hypodensity.

Vascular: No hyperdense vessel or unexpected calcification.

CT FACIAL BONES FINDINGS

Skull: Normal. Negative for fracture or focal lesion.

Facial bones: No displaced fractures or dislocations.

Sinuses/Orbits: No acute finding.

Other: None.

CT CERVICAL SPINE FINDINGS

Alignment: Degenerative straightening of the normal cervical
lordosis.

Skull base and vertebrae: No acute fracture. No primary bone lesion
or focal pathologic process.

Soft tissues and spinal canal: No prevertebral fluid or swelling. No
visible canal hematoma.

Disc levels: Focally moderate disc space height loss and
osteophytosis of the lower cervical levels from C5 through C7.

Upper chest: Negative.

Other: None.
IMPRESSION: 1. No acute intracranial pathology. Periventricular and deep white
matter hypodensity, in keeping with reported multiple sclerosis.
2. No displaced fractures or dislocations of the facial bones.
3. No fracture or static subluxation of the cervical spine. Focally
moderate disc degenerative disease of the lower cervical spine.

## 2022-11-12 IMAGING — DX DG HAND COMPLETE 3+V*L*
3 series · 3 of 3 positions shown · non-contrast
Comparison: None Available.

CLINICAL DATA: Fall.

EXAM:
LEFT HAND - COMPLETE 3+ VIEW

[hand pa]
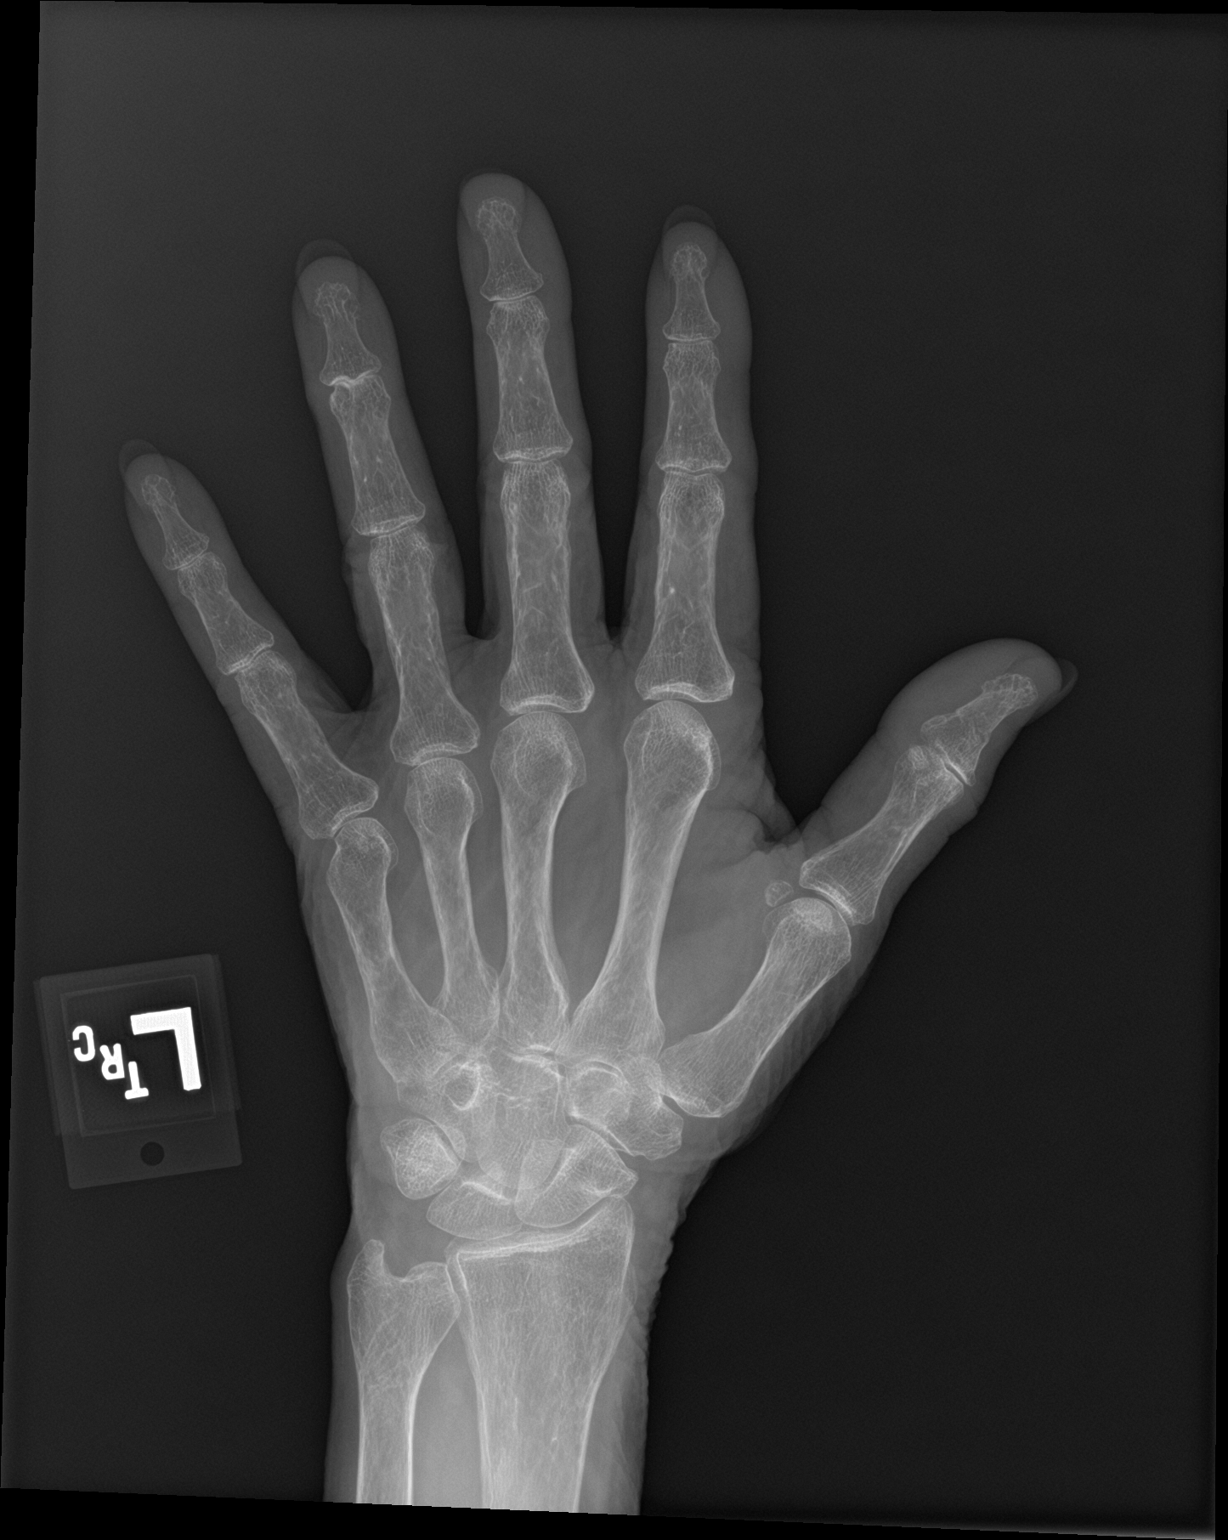

[hand obl]
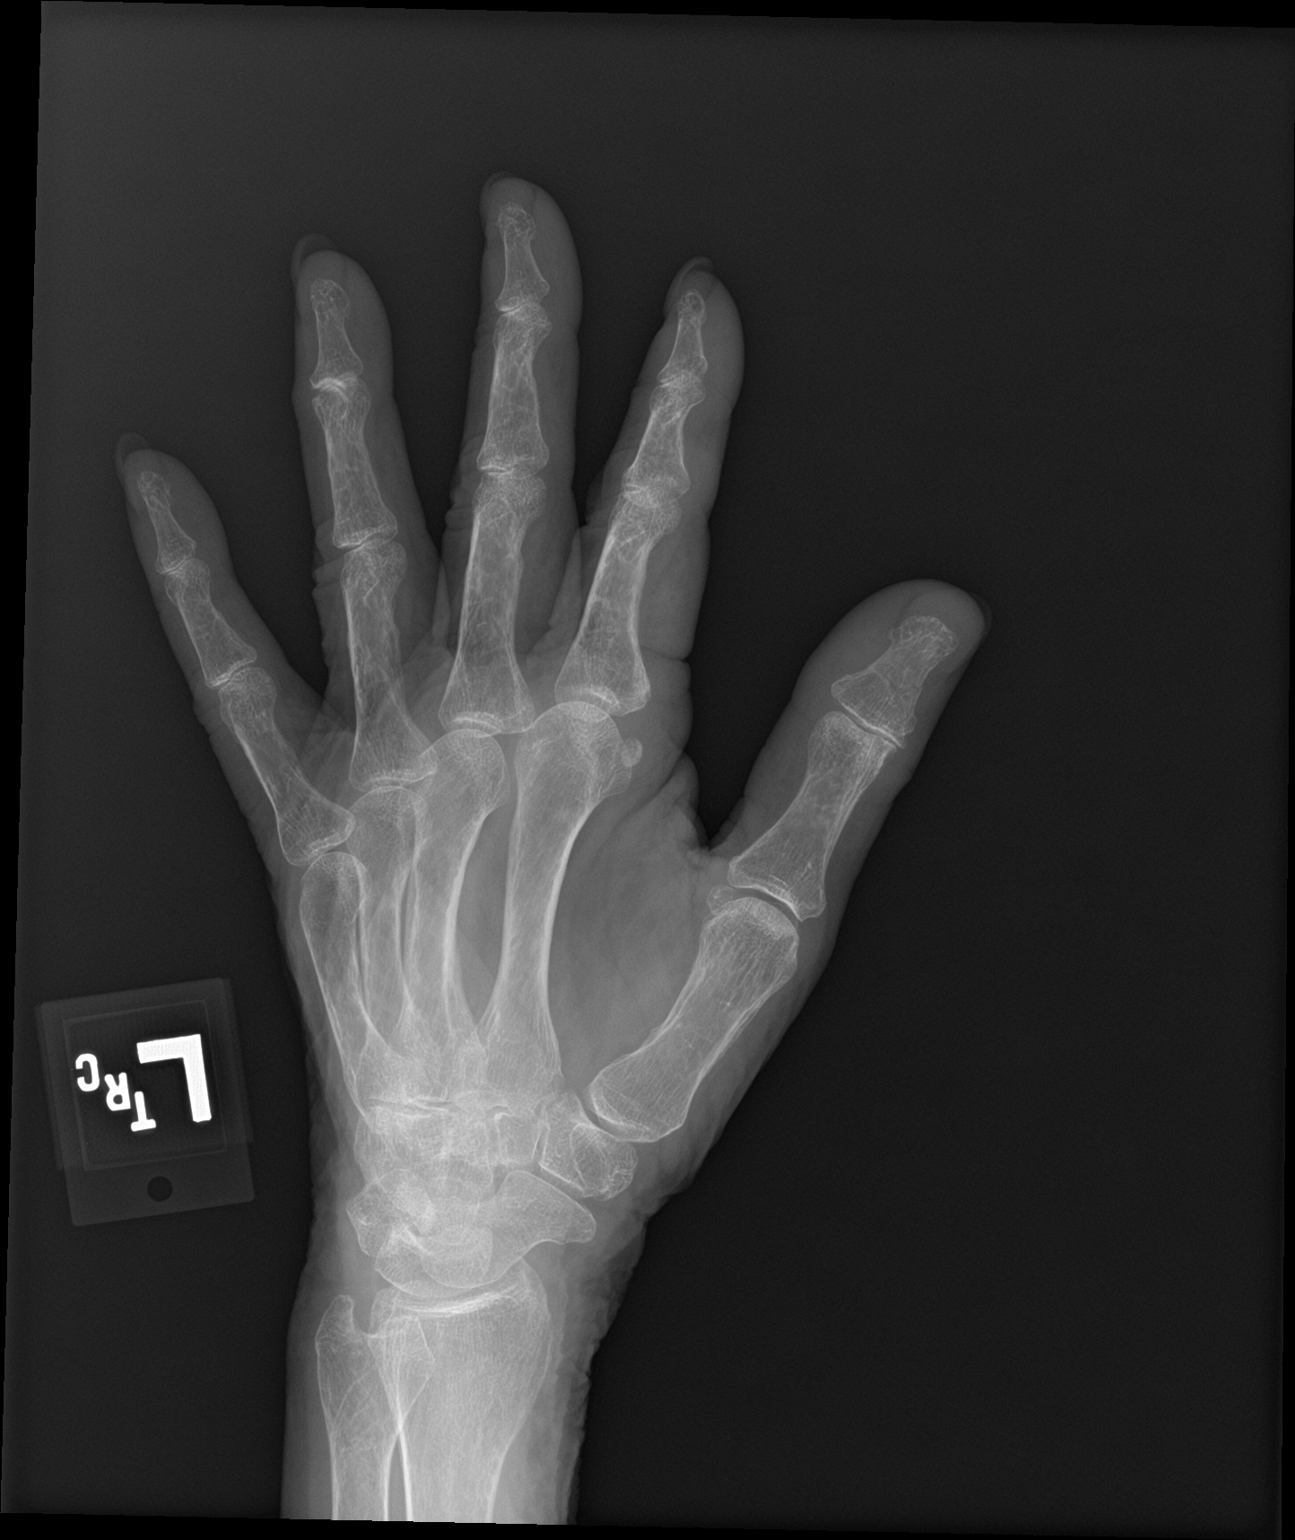

[hand lat]
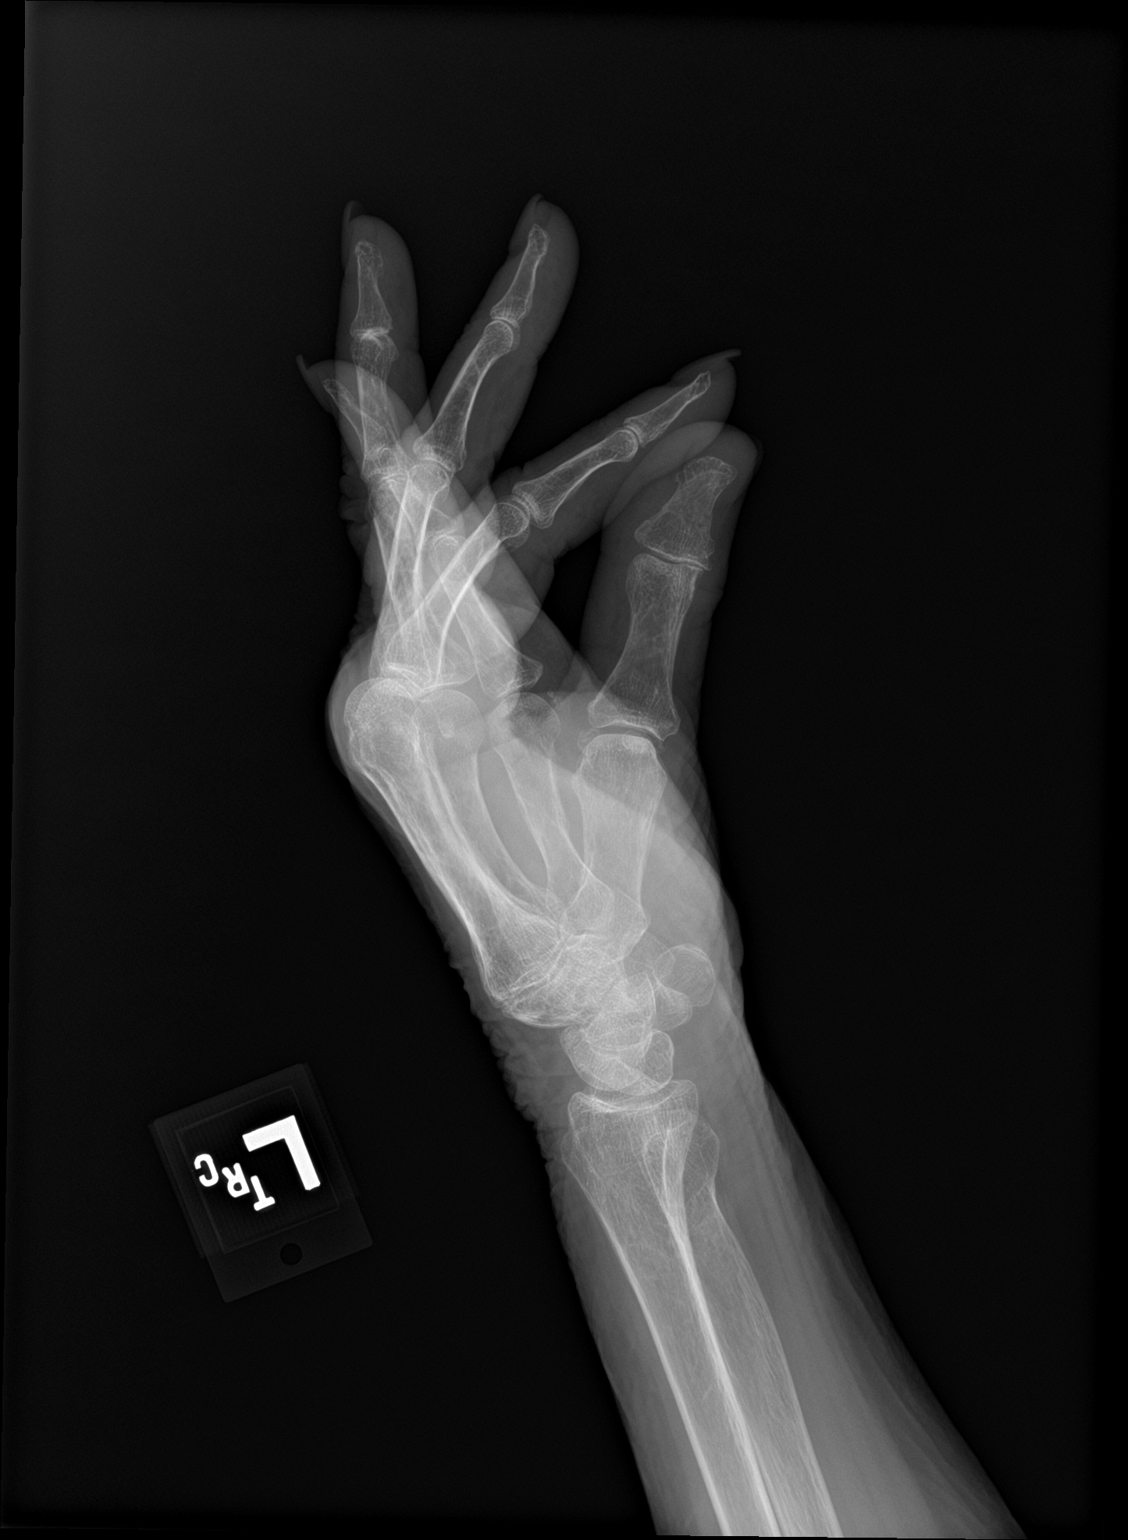

[3 of 3 positions shown; findings below may reference images not displayed]

FINDINGS: Marked diffuse osteopenia. A nondisplaced, age indeterminate
fracture deformity is noted involving the mid shaft of the first
distal phalanx. No additional signs of fracture or dislocation
identified. Mild D IP joint osteoarthritis noted.
IMPRESSION: 1. Age-indeterminate fracture involves the mid shaft of the first
distal phalanx.
2. Marked diffuse osteopenia.

## 2022-11-17 ENCOUNTER — Other Ambulatory Visit: Payer: Self-pay | Admitting: Nurse Practitioner

## 2022-11-19 ENCOUNTER — Telehealth: Payer: Medicare Other

## 2022-11-19 NOTE — Telephone Encounter (Signed)
Patient called about citalopram. She states that the citalopram is suppose to be for one-half tablet. Please advise how patient is to take medication.  Message sent to Abbey Chatters, NP

## 2022-11-19 NOTE — Telephone Encounter (Signed)
We have that she is taking a whole 10 mg tablet.  If she is only taking 5 mg (half of a 10 mg tablet) okay to update medication list.

## 2022-11-20 NOTE — Telephone Encounter (Signed)
Spoke again with patient she is taking 10 mg.

## 2022-11-25 ENCOUNTER — Encounter: Payer: Self-pay | Admitting: Gastroenterology

## 2022-11-25 ENCOUNTER — Ambulatory Visit (AMBULATORY_SURGERY_CENTER): Payer: Medicare Other

## 2022-11-25 VITALS — Ht 63.5 in | Wt 137.0 lb

## 2022-11-25 DIAGNOSIS — Z1211 Encounter for screening for malignant neoplasm of colon: Secondary | ICD-10-CM

## 2022-11-25 MED ORDER — NA SULFATE-K SULFATE-MG SULF 17.5-3.13-1.6 GM/177ML PO SOLN
1.0000 | Freq: Once | ORAL | 0 refills | Status: AC
Start: 2022-11-25 — End: 2022-11-25

## 2022-11-25 NOTE — Progress Notes (Signed)

## 2022-11-26 ENCOUNTER — Other Ambulatory Visit: Payer: Self-pay

## 2022-11-26 MED ORDER — HYDRALAZINE HCL 50 MG PO TABS: 50.0000 mg | ORAL_TABLET | Freq: Two times a day (BID) | ORAL | 3 refills | Status: DC

## 2022-11-27 ENCOUNTER — Telehealth: Payer: Self-pay | Admitting: Gastroenterology

## 2022-11-27 NOTE — Telephone Encounter (Signed)
Pt was wanting a new procedure date closer than the 9/23 scheduled one. She was confused on ab;e what she was confused on what she can have to eat so reviewed intstructions with pt who statedd seh understood but will also have a friend review them with her.

## 2022-11-27 NOTE — Telephone Encounter (Signed)
Patient rescheduled to 12/11/22 requested new prep instructions to be sent to her. Thanks

## 2022-12-03 ENCOUNTER — Other Ambulatory Visit: Payer: Self-pay | Admitting: Nurse Practitioner

## 2022-12-03 ENCOUNTER — Telehealth: Payer: Self-pay

## 2022-12-03 NOTE — Telephone Encounter (Signed)
Message left on clinical intake voicemail:  Thayer Ohm with [facility] called on behalf of the patient suggestion that the provider prescribe losartan 25 mg once daily versus 50 mg 1/2 tablet daily and send rx to [facility] in [place].   Left message on voicemail for patient to return call when available

## 2022-12-03 NOTE — Telephone Encounter (Signed)
I spoke with patient and she stated that she is ok if Shanda Bumps sends in RX as [facility] suggested 25 mg once daily. I asked patient to verify the pharmacy as we sent in a rx yesterday to [facility] and Thayer Ohm with [facility] suggested that we send to [facility].  Patient states she does not use [facility] anymore. I called [facility] and left a detailed message to VOID rx for losartan.   Message will be sent to Sharon Seller, NP to confirm medication change

## 2022-12-03 NOTE — Telephone Encounter (Signed)
1.) Patient has 50 mg tablet , 1/2 tablet daily.   2.) Her insurance company suggested that instead of patient halving a tablet that you rx 25 mg once daily.   3.) I called patient and she is in agreement with the insurance company.  4.) Request is for 25 mg once daily to avoid halving a 50 mg tablet

## 2022-12-03 NOTE — Telephone Encounter (Signed)
She wants to reduce medication or take 2 of the 25 mg tablet of losartan to equal 50 mg- it looks like we have her taking 50 mg daily

## 2022-12-04 MED ORDER — LOSARTAN POTASSIUM 25 MG PO TABS: 25.0000 mg | ORAL_TABLET | Freq: Every day | ORAL | 1 refills | Status: DC

## 2022-12-04 NOTE — Telephone Encounter (Signed)
In agreement, Rx sent

## 2022-12-08 ENCOUNTER — Telehealth: Payer: Self-pay | Admitting: Physician Assistant

## 2022-12-08 NOTE — Telephone Encounter (Signed)
Patient called today, multiple questions about diet preprep for colonoscopy tomorrow afternoon. All of her questions were answered

## 2022-12-09 ENCOUNTER — Encounter: Payer: Self-pay | Admitting: Gastroenterology

## 2022-12-09 ENCOUNTER — Ambulatory Visit: Payer: Medicare Other | Admitting: Gastroenterology

## 2022-12-09 VITALS — BP 146/63 | HR 53 | Temp 97.8°F | Resp 14 | Ht 63.5 in | Wt 135.0 lb

## 2022-12-09 DIAGNOSIS — D120 Benign neoplasm of cecum: Secondary | ICD-10-CM

## 2022-12-09 DIAGNOSIS — Z09 Encounter for follow-up examination after completed treatment for conditions other than malignant neoplasm: Secondary | ICD-10-CM

## 2022-12-09 DIAGNOSIS — Z86010 Personal history of colonic polyps: Secondary | ICD-10-CM

## 2022-12-09 DIAGNOSIS — Z1211 Encounter for screening for malignant neoplasm of colon: Secondary | ICD-10-CM

## 2022-12-09 DIAGNOSIS — G35 Multiple sclerosis: Secondary | ICD-10-CM | POA: Diagnosis not present

## 2022-12-09 MED ORDER — SODIUM CHLORIDE 0.9 % IV SOLN: 500.0000 mL | INTRAVENOUS | Status: DC

## 2022-12-09 NOTE — Progress Notes (Signed)
Pt's states no medical or surgical changes since previsit or office visit. 

## 2022-12-09 NOTE — Progress Notes (Signed)
Called to room to assist during endoscopic procedure.  Patient ID and intended procedure confirmed with present staff. Received instructions for my participation in the procedure from the performing physician.  

## 2022-12-09 NOTE — Progress Notes (Unsigned)
Vss nad trans to pacu 

## 2022-12-09 NOTE — Op Note (Signed)
[facility] Patient Name: Crystal Huber Procedure Date: 12/09/2022 3:17 PM MRN: [id number] Endoscopist: Napoleon Form , MD, [id number] Age: 77 Referring MD:  Date of Birth: Jan 30, 1946 Gender: Female Account #: [id number] Procedure:                Colonoscopy Indications:              High risk colon cancer surveillance: Personal                            history of colonic polyps, High risk colon cancer                            surveillance: Personal history of adenoma (10 mm or                            greater in size) Medicines:                Monitored Anesthesia Care Procedure:                Pre-Anesthesia Assessment:                           - Prior to the procedure, a History and Physical                            was performed, and patient medications and                            allergies were reviewed. The patient's tolerance of                            previous anesthesia was also reviewed. The risks                            and benefits of the procedure and the sedation                            options and risks were discussed with the patient.                            All questions were answered, and informed consent                            was obtained. Prior Anticoagulants: The patient has                            taken no anticoagulant or antiplatelet agents. ASA                            Grade Assessment: III - A patient with severe                            systemic disease. After reviewing the risks and  benefits, the patient was deemed in satisfactory                            condition to undergo the procedure.                           After obtaining informed consent, the colonoscope                            was passed under direct vision. Throughout the                            procedure, the patient's blood pressure, pulse, and                            oxygen saturations were monitored  continuously. The                            [id number]-HQ190L Colonoscope [id number] was introduced                            through the anus and advanced to the the cecum,                            identified by appendiceal orifice and ileocecal                            valve. The colonoscopy was performed without                            difficulty. The patient tolerated the procedure                            well. The quality of the bowel preparation was                            adequate. The ileocecal valve, appendiceal orifice,                            and rectum were photographed. Scope In: 3:39:17 PM Scope Out: 4:05:30 PM Scope Withdrawal Time: 0 hours 11 minutes 48 seconds  Total Procedure Duration: 0 hours 26 minutes 13 seconds  Findings:                 The perianal and digital rectal examinations were                            normal.                           A 15 mm polyp was found in the cecum. The polyp was                            sessile. The polyp was removed with a cold snare.  Resection and retrieval were complete.                           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                           Scattered large-mouthed, medium-mouthed and                            small-mouthed diverticula were found in the sigmoid                            colon, descending colon, transverse colon and                            ascending colon. There was narrowing of the colon                            in association with the diverticular opening. There                            was evidence of diverticular spasm.                           Non-bleeding external and internal hemorrhoids were                            found during retroflexion. The hemorrhoids were                            medium-sized. Complications:            No immediate complications. Estimated Blood Loss:     Estimated blood loss was minimal. Impression:                - One 15 mm polyp in the cecum, removed with a cold                            snare. Resected and retrieved.                           - Moderate diverticulosis in the sigmoid colon, in                            the descending colon, in the transverse colon and                            in the ascending colon. There was narrowing of the                            colon in association with the diverticular opening.                            There was evidence of diverticular spasm.                           -  Non-bleeding external and internal hemorrhoids. Recommendation:           - Patient has a contact number available for                            emergencies. The signs and symptoms of potential                            delayed complications were discussed with the                            patient. Return to normal activities tomorrow.                            Written discharge instructions were provided to the                            patient.                           - Resume previous diet.                           - Continue present medications.                           - Await pathology results.                           - No repeat colonoscopy due to age. Napoleon Form, MD 12/09/2022 4:17:34 PM This report has been signed electronically.

## 2022-12-09 NOTE — Patient Instructions (Addendum)
NO Repeat routine colonoscopy due to age.  Handouts Provided:  Diverticulosis and Polyps  YOU HAD AN ENDOSCOPIC PROCEDURE TODAY AT THE [facility]:   Refer to the procedure report that was given to you for any specific questions about what was found during the examination.  If the procedure report does not answer your questions, please call your gastroenterologist to clarify.  If you requested that your care partner not be given the details of your procedure findings, then the procedure report has been included in a sealed envelope for you to review at your convenience later.  YOU SHOULD EXPECT: Some feelings of bloating in the abdomen. Passage of more gas than usual.  Walking can help get rid of the air that was put into your GI tract during the procedure and reduce the bloating. If you had a lower endoscopy (such as a colonoscopy or flexible sigmoidoscopy) you may notice spotting of blood in your stool or on the toilet paper. If you underwent a bowel prep for your procedure, you may not have a normal bowel movement for a few days.  Please Note:  You might notice some irritation and congestion in your nose or some drainage.  This is from the oxygen used during your procedure.  There is no need for concern and it should clear up in a day or so.  SYMPTOMS TO REPORT IMMEDIATELY:  Following lower endoscopy (colonoscopy or flexible sigmoidoscopy):  Excessive amounts of blood in the stool  Significant tenderness or worsening of abdominal pains  Swelling of the abdomen that is new, acute  Fever of 100F or higher  For urgent or emergent issues, a gastroenterologist can be reached at any hour by calling [phone]. Do not use MyChart messaging for urgent concerns.    DIET:  We do recommend a small meal at first, but then you may proceed to your regular diet.  Drink plenty of fluids but you should avoid alcoholic beverages for 24 hours.  ACTIVITY:  You should plan to take it easy for  the rest of today and you should NOT DRIVE or use heavy machinery until tomorrow (because of the sedation medicines used during the test).    FOLLOW UP: Our staff will call the number listed on your records the next business day following your procedure.  We will call around 7:15- 8:00 am to check on you and address any questions or concerns that you may have regarding the information given to you following your procedure. If we do not reach you, we will leave a message.     If any biopsies were taken you will be contacted by phone or by letter within the next 1-3 weeks.  Please call us at [phone] if you have not heard about the biopsies in 3 weeks.    SIGNATURES/CONFIDENTIALITY: You and/or your care partner have signed paperwork which will be entered into your electronic medical record.  These signatures attest to the fact that that the information above on your After Visit Summary has been reviewed and is understood.  Full responsibility of the confidentiality of this discharge information lies with you and/or your care-partner.

## 2022-12-09 NOTE — Progress Notes (Unsigned)
[facility] History and Physical   Primary Care Physician:  Sharon Seller, NP   Reason for Procedure:  History of adenomatous colon polyps  Plan:    Surveillance colonoscopy with possible interventions as needed     HPI: Crystal Huber is a very pleasant 77 y.o. female here for surveillance colonoscopy. Denies any nausea, vomiting, abdominal pain, melena or bright red blood per rectum  The risks and benefits as well as alternatives of endoscopic procedure(s) have been discussed and reviewed. All questions answered. The patient agrees to proceed.    Past Medical History:  Diagnosis Date   Arthritis    right pinkie   Cancer (HCC)    right BR  CA    Cataract    right eye   GERD (gastroesophageal reflux disease)    prn tums, mild not often   Glaucoma    Hyperlipidemia    Hypertension    Multiple sclerosis (HCC)    Neuromuscular disorder (HCC)    trigeminal neuralgia   Refusal of blood product    Trigeminal neuralgia of left side of face     Past Surgical History:  Procedure Laterality Date   BREAST LUMPECTOMY Right    wears prosthesis   CATARACT EXTRACTION Left 05/2022   COLONOSCOPY  12/20/2010   ganglion cyst removal Bilateral    NERVE SURGERY  05/2022   PARTIAL HYSTERECTOMY     POLYPECTOMY      Prior to Admission medications   Medication Sig Start Date End Date Taking? Authorizing Provider  aspirin EC 81 MG tablet Take 1 tablet (81 mg total) by mouth daily. Swallow whole. 01/22/22  Yes Elgergawy, Leana Roe, MD  Biotin w/ Vitamins C & E (HAIR/SKIN/NAILS PO) Take 1 tablet by mouth daily.   Yes [provider]  Calcium Carb-Cholecalciferol (CALCIUM 500 + D PO) Take by mouth.   Yes [provider]  citalopram (CELEXA) 10 MG tablet Take 1 tablet (10 mg total) by mouth daily. 06/17/22  Yes Sharon Seller, NP  dorzolamide-timolol (COSOPT) 2-0.5 % ophthalmic solution Place into the right eye. 11/04/22  Yes [provider]   ezetimibe (ZETIA) 10 MG tablet Take 1 tablet (10 mg total) by mouth daily. 06/18/22  Yes Sharon Seller, NP  gabapentin (NEURONTIN) 300 MG capsule TAKE ONE OR TWO CAPSULES BY MOUTH THREE TIMES DAILY (UP TO 5 CAPSULES DAILY 08/29/22  Yes Sater, Pearletha Furl, MD  hydrALAZINE (APRESOLINE) 50 MG tablet Take 1 tablet (50 mg total) by mouth 2 (two) times daily. 11/26/22  Yes Sharon Seller, NP  lamoTRIgine (LAMICTAL) 150 MG tablet Take 1 tablet (150 mg total) by mouth 3 (three) times daily. 10/30/22  Yes Sater, Pearletha Furl, MD  latanoprost (XALATAN) 0.005 % ophthalmic solution Place 2 drops into both eyes at bedtime.   Yes [provider]  losartan (COZAAR) 25 MG tablet Take 1 tablet (25 mg total) by mouth daily. 12/04/22  Yes Sharon Seller, NP  meclizine (ANTIVERT) 25 MG tablet Take 25 mg by mouth daily. 08/04/15  Yes [provider]  metoprolol succinate (TOPROL XL) 25 MG 24 hr tablet Take 1 tablet (25 mg total) by mouth daily. 09/26/22 09/26/23 Yes Sharon Seller, NP  Multiple Vitamin (MULTIVITAMIN) tablet Take 1 tablet by mouth daily.   Yes [provider]  timolol (TIMOPTIC) 0.5 % ophthalmic solution Place 1 drop into both eyes 2 (two) times daily. 03/21/20  Yes [provider]    Current Outpatient Medications  Medication Sig Dispense Refill   aspirin EC 81 MG tablet Take 1 tablet (81 mg total) by mouth daily. Swallow whole. 30 tablet 12   Biotin w/ Vitamins C & E (HAIR/SKIN/NAILS PO) Take 1 tablet by mouth daily.     Calcium Carb-Cholecalciferol (CALCIUM 500 + D PO) Take by mouth.     citalopram (CELEXA) 10 MG tablet Take 1 tablet (10 mg total) by mouth daily. 30 tablet 3   dorzolamide-timolol (COSOPT) 2-0.5 % ophthalmic solution Place into the right eye.     ezetimibe (ZETIA) 10 MG tablet Take 1 tablet (10 mg total) by mouth daily. 90 tablet 3   gabapentin (NEURONTIN) 300 MG capsule TAKE ONE OR TWO CAPSULES BY MOUTH THREE TIMES DAILY (UP TO 5 CAPSULES  DAILY 450 capsule 0   hydrALAZINE (APRESOLINE) 50 MG tablet Take 1 tablet (50 mg total) by mouth 2 (two) times daily. 180 tablet 3   lamoTRIgine (LAMICTAL) 150 MG tablet Take 1 tablet (150 mg total) by mouth 3 (three) times daily. 270 tablet 0   latanoprost (XALATAN) 0.005 % ophthalmic solution Place 2 drops into both eyes at bedtime.     losartan (COZAAR) 25 MG tablet Take 1 tablet (25 mg total) by mouth daily. 90 tablet 1   meclizine (ANTIVERT) 25 MG tablet Take 25 mg by mouth daily.     metoprolol succinate (TOPROL XL) 25 MG 24 hr tablet Take 1 tablet (25 mg total) by mouth daily.     Multiple Vitamin (MULTIVITAMIN) tablet Take 1 tablet by mouth daily.     timolol (TIMOPTIC) 0.5 % ophthalmic solution Place 1 drop into both eyes 2 (two) times daily.     Current Facility-Administered Medications  Medication Dose Route Frequency Provider Last Rate Last Admin   0.9 %  sodium chloride infusion  500 mL Intravenous Continuous Daivik Overley, Eleonore Chiquito, MD        Allergies as of 12/09/2022 - Review Complete 12/09/2022  Allergen Reaction Noted   Carbamazepine  11/02/2019   Imipramine  07/22/2019   Metronidazole Nausea Only 12/13/2013   Oxcarbazepine  11/02/2019   Penicillins Swelling 12/13/2013   Red blood cells Other (See Comments) 09/28/2021   Statins  07/29/2016   Sulfa antibiotics Swelling 12/13/2013    Family History  Problem Relation Age of Onset   Congestive Heart Failure Mother    Heart disease Father    Heart attack Father    Stroke Sister    Breast cancer Sister    Heart disease Sister    Heart disease Brother    Heart disease Brother    Heart disease Brother    Colon cancer Neg Hx    Colon polyps Neg Hx    Esophageal cancer Neg Hx    Rectal cancer Neg Hx    Stomach cancer Neg Hx     Social History   Socioeconomic History   Marital status: Widowed    Spouse name: Not on file   Number of children: Not on file   Years of education: Not on file   Highest education  level: Not on file  Occupational History   Not on file  Tobacco Use   Smoking status: Former    Types: Cigarettes   Smokeless tobacco: Never   Tobacco comments:    Quit smoking at age 23 and only smoke occasional x 1.5 years   Vaping Use   Vaping status: Never Used  Substance and Sexual Activity   Alcohol use: Yes  Comment: 1 glass of wine a night   Drug use: No   Sexual activity: Not on file  Other Topics Concern   Not on file  Social History Narrative   Diet      Do you drink/eat things with caffeine      Marital Status     What year were you married?      Do you live in a house, apartment, assisted living, condo, trailer, etc.?      Is it one or more stories?      How many persons live in your home?         Do you have any pets in your home?(please list)      Highest level of education completed:      Current or past profession:      Do you exercise?:    Type and how often:      Do you have a Living Will? (Form that indicates scenarios where you would not want your life prolonged)      Do you have a DNR form?         If not, would you like to discuss one?      Do you have signed POA/HPOA forms? Yes      Do you have difficulty bathing or dressing yourself?      Do you have difficulty preparing food or eating?      Do you have difficulty managing medications?      Do you have difficulty managing your finances?      Do you have difficulty affording your medications?                     Social Determinants of Health   Financial Resource Strain: Not on file  Food Insecurity: No Food Insecurity (01/16/2022)   Hunger Vital Sign    Worried About Running Out of Food in the Last Year: Never true    Ran Out of Food in the Last Year: Never true  Transportation Needs: No Transportation Needs (01/16/2022)   PRAPARE - Administrator, Civil Service (Medical): No    Lack of Transportation (Non-Medical): No  Physical Activity: Not on file  Stress:  Not on file  Social Connections: Unknown (07/29/2021)   Received from [facility]   Social Network    Social Network: Not on file  Intimate Partner Violence: Not At Risk (01/16/2022)   Humiliation, Afraid, Rape, and Kick questionnaire    Fear of Current or Ex-Partner: No    Emotionally Abused: No    Physically Abused: No    Sexually Abused: No    Review of Systems:  All other review of systems negative except as mentioned in the HPI.  Physical Exam: Vital signs in last 24 hours: BP (!) 147/68   Pulse (!) 49   Temp 97.8 F (36.6 C)   Resp 12   Ht 5' 3.5" (1.613 m)   Wt 135 lb (61.2 kg)   SpO2 99%   BMI 23.54 kg/m  General:   Alert, NAD Lungs:  Clear .   Heart:  Regular rate and rhythm Abdomen:  Soft, nontender and nondistended. Neuro/Psych:  Alert and cooperative. Normal mood and affect. A and O x 3  Reviewed labs, radiology imaging, old records and pertinent past GI work up  Patient is appropriate for planned procedure(s) and anesthesia in an ambulatory setting   K. Scherry Ran , MD [phone]

## 2022-12-10 ENCOUNTER — Telehealth: Payer: Self-pay

## 2022-12-10 NOTE — Telephone Encounter (Signed)
  Follow up Call-     12/09/2022    3:15 PM  Call back number  Post procedure Call Back phone  # [phone]  Permission to leave phone message Yes     Patient questions:  Do you have a fever, pain , or abdominal swelling? No. Pain Score  0 *  Have you tolerated food without any problems? Yes.    Have you been able to return to your normal activities? Yes.    Do you have any questions about your discharge instructions: Diet   No. Medications  No. Follow up visit  No.  Do you have questions or concerns about your Care? No.  Actions: * If pain score is 4 or above: No action needed, pain <4.

## 2022-12-11 ENCOUNTER — Telehealth: Payer: Self-pay | Admitting: Gastroenterology

## 2022-12-11 NOTE — Telephone Encounter (Signed)
PT had colonoscopy 9/23 and is experiencing a lot of itching around the rectum. Please advise.

## 2022-12-12 LAB — SURGICAL PATHOLOGY

## 2022-12-12 NOTE — Telephone Encounter (Signed)
Called the patient back. Left voicemail of my call.

## 2022-12-12 NOTE — Telephone Encounter (Signed)
Patient reports rectal itching that has not improved with application of Vasoline or the application of "baby corn starch." She does not notice any blood, rash, bumps or bulges.  Please advise.

## 2022-12-13 ENCOUNTER — Encounter: Payer: Self-pay | Admitting: Gastroenterology

## 2022-12-13 NOTE — Telephone Encounter (Signed)
Inbound call from patient calling in regards to note below,  advised her that the pathology has not been yet reviewed by the doctor. And as soon as it was reviewed , a nurse would call her with the recommendations and results.

## 2022-12-13 NOTE — Telephone Encounter (Signed)
Inbound call from patient patient stating she received a mychart message yesterday 9/26 but is unable to read it. Patient requesting a call back. Please advise, thank you.

## 2022-12-13 NOTE — Telephone Encounter (Signed)
Benign polyp, was removed. Usually we stop routine colonoscopy for surveillance around age 77, hence do not recommend follow up recall. Result letter was sent through [facility]

## 2022-12-16 NOTE — Telephone Encounter (Signed)
The pt has been advised via My Chart- she does view her messages

## 2022-12-23 ENCOUNTER — Ambulatory Visit (INDEPENDENT_AMBULATORY_CARE_PROVIDER_SITE_OTHER): Payer: Medicare Other | Admitting: Nurse Practitioner

## 2022-12-23 ENCOUNTER — Encounter: Payer: Self-pay | Admitting: Nurse Practitioner

## 2022-12-23 VITALS — BP 100/64 | HR 48 | Temp 97.5°F | Ht 63.5 in | Wt 136.0 lb

## 2022-12-23 DIAGNOSIS — R42 Dizziness and giddiness: Secondary | ICD-10-CM | POA: Diagnosis not present

## 2022-12-23 DIAGNOSIS — F419 Anxiety disorder, unspecified: Secondary | ICD-10-CM

## 2022-12-23 DIAGNOSIS — E7800 Pure hypercholesterolemia, unspecified: Secondary | ICD-10-CM | POA: Diagnosis not present

## 2022-12-23 DIAGNOSIS — Z23 Encounter for immunization: Secondary | ICD-10-CM

## 2022-12-23 DIAGNOSIS — R001 Bradycardia, unspecified: Secondary | ICD-10-CM | POA: Diagnosis not present

## 2022-12-23 DIAGNOSIS — Z0000 Encounter for general adult medical examination without abnormal findings: Secondary | ICD-10-CM

## 2022-12-23 MED ORDER — MECLIZINE HCL 25 MG PO TABS: 25.0000 mg | ORAL_TABLET | Freq: Every day | ORAL | 0 refills | Status: DC | PRN

## 2022-12-23 MED ORDER — EZETIMIBE 10 MG PO TABS
10.0000 mg | ORAL_TABLET | Freq: Every day | ORAL | 3 refills | Status: DC
Start: 2022-12-23 — End: 2024-02-05

## 2022-12-23 MED ORDER — CITALOPRAM HYDROBROMIDE 10 MG PO TABS
10.0000 mg | ORAL_TABLET | Freq: Every day | ORAL | 3 refills | Status: DC
Start: 2022-12-23 — End: 2023-04-30

## 2022-12-23 NOTE — Patient Instructions (Addendum)
To stop metoprolol due to low HR  Increase fluid intake- more water  Continue to monitor HR and BP daily   ONLY use meclizine as needed for dizziness  To get TDAP, shingles, COVID booster at local pharmacy

## 2022-12-23 NOTE — Progress Notes (Signed)
Careteam: Patient Care Team: Sharon Seller, NP as PCP - General (Geriatric Medicine)  PLACE OF SERVICE:  [facility]  Advanced Directive information Does Patient Have a Medical Advance Directive?: Yes, Type of Advance Directive: Healthcare Power of [place];Living will, Does patient want to make changes to medical advance directive?: No - Patient declined  Allergies  Allergen Reactions   Carbamazepine     Decreased sodium level   Imipramine     Night sweats, more intense dreams   Metronidazole Nausea Only   Oxcarbazepine     Decreased sodium level   Penicillins Swelling    Yeast infection   Red Blood Cells Other (See Comments)    Jehovah's witness, does not want blood products   Statins     Gets hyponatremia and severe muscle pain   Sulfa Antibiotics Swelling    yeast infection    Chief Complaint  Patient presents with   Acute Visit    Discuss rx for meclizine, patient with vertigo and MS which makes her light-headed. Discuss metoprolol, possible dose change due to light headedness.      HPI: Patient is a 77 y.o. female due to being dizzy She is reports she has been more dizzy since she has been cutting her metoprolol in half.  (This was over a year ago when she was hospitalized)  She reports it is worse over the last few months.  Off and on but worse recently Reports sometimes happens when she stands up.  Other times when she is walking Just feels off.  HR 48 today.   Takes meclizine daily for dizziness.   Review of Systems:  Review of Systems  Constitutional:  Negative for chills, fever and weight loss.  HENT:  Negative for tinnitus.   Respiratory:  Negative for cough, sputum production and shortness of breath.   Cardiovascular:  Negative for chest pain, palpitations and leg swelling.  Gastrointestinal:  Negative for abdominal pain, constipation, diarrhea and heartburn.  Genitourinary:  Negative for dysuria, frequency and urgency.  Musculoskeletal:   Negative for back pain, falls, joint pain and myalgias.  Skin: Negative.   Neurological:  Positive for dizziness. Negative for headaches.  Psychiatric/Behavioral:  Negative for memory loss.     Past Medical History:  Diagnosis Date   Arthritis    right pinkie   Cancer (HCC)    right BR  CA    Cataract    right eye   GERD (gastroesophageal reflux disease)    prn tums, mild not often   Glaucoma    Hyperlipidemia    Hypertension    Multiple sclerosis (HCC)    Neuromuscular disorder (HCC)    trigeminal neuralgia   Refusal of blood product    Trigeminal neuralgia of left side of face    Past Surgical History:  Procedure Laterality Date   BREAST LUMPECTOMY Right    wears prosthesis   CATARACT EXTRACTION Left 05/2022   COLONOSCOPY  12/20/2010   ganglion cyst removal Bilateral    NERVE SURGERY  05/2022   PARTIAL HYSTERECTOMY     POLYPECTOMY     Social History:   reports that she has quit smoking. Her smoking use included cigarettes. She has never used smokeless tobacco. She reports current alcohol use. She reports that she does not use drugs.  Family History  Problem Relation Age of Onset   Congestive Heart Failure Mother    Heart disease Father    Heart attack Father    Stroke Sister  Breast cancer Sister    Heart disease Sister    Heart disease Brother    Heart disease Brother    Heart disease Brother    Colon cancer Neg Hx    Colon polyps Neg Hx    Esophageal cancer Neg Hx    Rectal cancer Neg Hx    Stomach cancer Neg Hx     Medications: Patient's Medications  New Prescriptions   No medications on file  Previous Medications   ASPIRIN EC 81 MG TABLET    Take 1 tablet (81 mg total) by mouth daily. Swallow whole.   BIOTIN W/ VITAMINS C & E (HAIR/SKIN/NAILS PO)    Take 1 tablet by mouth daily.   CALCIUM CARB-CHOLECALCIFEROL (CALCIUM 500 + D PO)    Take by mouth.   CITALOPRAM (CELEXA) 10 MG TABLET    Take 1 tablet (10 mg total) by mouth daily.    DORZOLAMIDE-TIMOLOL (COSOPT) 2-0.5 % OPHTHALMIC SOLUTION    Place into the right eye.   EZETIMIBE (ZETIA) 10 MG TABLET    Take 1 tablet (10 mg total) by mouth daily.   GABAPENTIN (NEURONTIN) 300 MG CAPSULE    TAKE ONE OR TWO CAPSULES BY MOUTH THREE TIMES DAILY (UP TO 5 CAPSULES DAILY   HYDRALAZINE (APRESOLINE) 50 MG TABLET    Take 1 tablet (50 mg total) by mouth 2 (two) times daily.   LAMOTRIGINE (LAMICTAL) 150 MG TABLET    Take 1 tablet (150 mg total) by mouth 3 (three) times daily.   LATANOPROST (XALATAN) 0.005 % OPHTHALMIC SOLUTION    Place 2 drops into both eyes at bedtime.   LOSARTAN (COZAAR) 25 MG TABLET    Take 1 tablet (25 mg total) by mouth daily.   MECLIZINE (ANTIVERT) 25 MG TABLET    Take 25 mg by mouth daily.   METOPROLOL SUCCINATE (TOPROL XL) 25 MG 24 HR TABLET    Take 1 tablet (25 mg total) by mouth daily.   MULTIPLE VITAMIN (MULTIVITAMIN) TABLET    Take 1 tablet by mouth daily.   TIMOLOL (TIMOPTIC) 0.5 % OPHTHALMIC SOLUTION    Place 1 drop into both eyes 2 (two) times daily.  Modified Medications   No medications on file  Discontinued Medications   No medications on file    Physical Exam:  Vitals:   12/23/22 1113  BP: 100/64  Pulse: (!) 45  Temp: (!) 97.5 F (36.4 C)  TempSrc: Temporal  SpO2: 97%  Weight: 136 lb (61.7 kg)  Height: 5' 3.5" (1.613 m)   Body mass index is 23.71 kg/m. Wt Readings from Last 3 Encounters:  12/23/22 136 lb (61.7 kg)  12/09/22 135 lb (61.2 kg)  11/25/22 137 lb (62.1 kg)    Physical Exam Constitutional:      General: She is not in acute distress.    Appearance: She is well-developed. She is not diaphoretic.  HENT:     Head: Normocephalic and atraumatic.  Eyes:     Conjunctiva/sclera: Conjunctivae normal.     Pupils: Pupils are equal, round, and reactive to light.  Cardiovascular:     Rate and Rhythm: Regular rhythm. Bradycardia present.     Heart sounds: Normal heart sounds.  Pulmonary:     Effort: Pulmonary effort is normal.      Breath sounds: Normal breath sounds.  Musculoskeletal:     Cervical back: Normal range of motion and neck supple.     Right lower leg: No edema.     Left lower  leg: No edema.  Skin:    General: Skin is warm and dry.  Neurological:     Mental Status: She is alert.  Psychiatric:        Mood and Affect: Mood normal.     Labs reviewed: Basic Metabolic Panel: Recent Labs    01/17/22 0351 01/19/22 0229 06/17/22 1220 10/14/22 1134  NA 134* 137 137 135  K 3.5 4.2 4.5 4.6  CL 100 102 100 100  CO2 26 26 30 29   GLUCOSE 88 85 86 75  BUN 10 12 15 13   CREATININE 0.75 0.72 0.73 0.77  CALCIUM 8.8* 9.1 10.1 9.5  PHOS 3.1 3.4  --   --    Liver Function Tests: Recent Labs    01/15/22 1640 06/17/22 1220 10/14/22 1134  AST 22 21 19   ALT 14 17 14   ALKPHOS 91  --   --   BILITOT 0.4 0.4 0.5  PROT 6.6 7.1 6.6  ALBUMIN 3.9  --   --    No results for input(s): "LIPASE", "AMYLASE" in the last 8760 hours. No results for input(s): "AMMONIA" in the last 8760 hours. CBC: Recent Labs    01/15/22 1640 01/15/22 1644 01/16/22 0514 01/17/22 0351 06/17/22 1220  WBC 4.8  --  7.0 5.0 5.1  NEUTROABS 2.9  --   --   --  2,703  HGB 12.7   < > 12.3 11.9* 13.2  HCT 37.4   < > 37.5 34.8* 39.7  MCV 93.3  --  95.4 92.6 93.2  PLT 205  --  201 191 263   < > = values in this interval not displayed.   Lipid Panel: Recent Labs    01/16/22 0514 06/17/22 1220 10/14/22 1134  CHOL 250* 273* 216*  HDL 104 106 115  LDLCALC 139* 151* 87  TRIG 34 56 54  CHOLHDL 2.4 2.6 1.9   TSH: No results for input(s): "TSH" in the last 8760 hours. A1C: Lab Results  Component Value Date   HGBA1C 5.4 01/16/2022     Assessment/Plan 1. Hypercholesterolemia Refill provided  - ezetimibe (ZETIA) 10 MG tablet; Take 1 tablet (10 mg total) by mouth daily.  Dispense: 90 tablet; Refill: 3  2. Anxiety Refill provided - citalopram (CELEXA) 10 MG tablet; Take 1 tablet (10 mg total) by mouth daily.  Dispense: 90  tablet; Refill: 3  3. Need for influenza vaccination - Flu Vaccine Trivalent High Dose (Fluad)  4. Bradycardia - EKG 12-Lead HR 50  Will stop metoprolol at this time Have her ck bp and HR at home and bring to follow up appt.   5. Dizziness Likely due to low HR, bp also low so may need to adjust medications as well Will have her monitor.  BP and HR at home.  - meclizine (ANTIVERT) 25 MG tablet; Take 1 tablet (25 mg total) by mouth daily as needed for dizziness.  Dispense: 30 tablet; Refill: 0  Return in 4 weeks (on 01/20/2023) for bp and HR and dizziness . Will need to reschedule AWV  Kaheem Halleck K. Biagio Borg [facility] [phone]

## 2023-01-20 ENCOUNTER — Ambulatory Visit (INDEPENDENT_AMBULATORY_CARE_PROVIDER_SITE_OTHER): Payer: Medicare Other | Admitting: Nurse Practitioner

## 2023-01-20 ENCOUNTER — Encounter: Payer: Self-pay | Admitting: Nurse Practitioner

## 2023-01-20 VITALS — BP 122/80 | HR 76 | Temp 97.9°F | Ht 63.5 in | Wt 135.0 lb

## 2023-01-20 DIAGNOSIS — I10 Essential (primary) hypertension: Secondary | ICD-10-CM

## 2023-01-20 DIAGNOSIS — F32A Depression, unspecified: Secondary | ICD-10-CM

## 2023-01-20 DIAGNOSIS — R001 Bradycardia, unspecified: Secondary | ICD-10-CM

## 2023-01-20 DIAGNOSIS — R42 Dizziness and giddiness: Secondary | ICD-10-CM

## 2023-01-20 NOTE — Progress Notes (Unsigned)
Careteam: Patient Care Team: Sharon Seller, NP as PCP - General (Geriatric Medicine)  PLACE OF SERVICE:  [facility]  Advanced Directive information Does Patient Have a Medical Advance Directive?: Yes, Type of Advance Directive: Healthcare Power of [place];Living will, Does patient want to make changes to medical advance directive?: No - Patient declined  Allergies  Allergen Reactions   Carbamazepine     Decreased sodium level   Imipramine     Night sweats, more intense dreams   Metronidazole Nausea Only   Oxcarbazepine     Decreased sodium level   Penicillins Swelling    Yeast infection   Red Blood Cells Other (See Comments)    Jehovah's witness, does not want blood products   Statins     Gets hyponatremia and severe muscle pain   Sulfa Antibiotics Swelling    yeast infection    Chief Complaint  Patient presents with   Follow-up    4 week follow-up on dizziness, blood pressure, and heart rate. Patient states " I feel good."  Discuss krill oil, patient restarted within the last week.      HPI: Patient is a 77 y.o. female for follow up on dizziness.   Reports she overall feels better now with stopping of metoprolol.  She was taking 25 mg daily but HR was low and she was dizzy. Overall dizziness has improve.   Reports she occasionally will have the blues- some days she is very happy but other days will feel alone or lonely.  She will feel down or upset and it will not last long and she will go to sleep at night and then the next day will feel better She is taking citalopram 10 mg tablet - she states it was cut back.   Review of Systems:  Review of Systems  Constitutional:  Negative for chills, fever and weight loss.  HENT:  Negative for tinnitus.   Respiratory:  Negative for cough, sputum production and shortness of breath.   Cardiovascular:  Negative for chest pain, palpitations and leg swelling.  Gastrointestinal:  Negative for abdominal pain,  constipation, diarrhea and heartburn.  Genitourinary:  Negative for dysuria, frequency and urgency.  Musculoskeletal:  Negative for back pain, falls, joint pain and myalgias.  Skin: Negative.   Neurological:  Negative for dizziness and headaches.  Psychiatric/Behavioral:  Negative for memory loss. The patient does not have insomnia.        Has some episodes of feeling down    Past Medical History:  Diagnosis Date   Arthritis    right pinkie   Cancer (HCC)    right BR  CA    Cataract    right eye   GERD (gastroesophageal reflux disease)    prn tums, mild not often   Glaucoma    Hyperlipidemia    Hypertension    Multiple sclerosis (HCC)    Neuromuscular disorder (HCC)    trigeminal neuralgia   Refusal of blood product    Trigeminal neuralgia of left side of face    Past Surgical History:  Procedure Laterality Date   BREAST LUMPECTOMY Right    wears prosthesis   CATARACT EXTRACTION Left 05/2022   COLONOSCOPY  12/20/2010   ganglion cyst removal Bilateral    NERVE SURGERY  05/2022   PARTIAL HYSTERECTOMY     POLYPECTOMY     Social History:   reports that she has quit smoking. Her smoking use included cigarettes. She has never used smokeless tobacco. She  reports current alcohol use. She reports that she does not use drugs.  Family History  Problem Relation Age of Onset   Congestive Heart Failure Mother    Heart disease Father    Heart attack Father    Stroke Sister    Breast cancer Sister    Heart disease Sister    Heart disease Brother    Heart disease Brother    Heart disease Brother    Colon cancer Neg Hx    Colon polyps Neg Hx    Esophageal cancer Neg Hx    Rectal cancer Neg Hx    Stomach cancer Neg Hx     Medications: Patient's Medications  New Prescriptions   No medications on file  Previous Medications   ASPIRIN EC 81 MG TABLET    Take 1 tablet (81 mg total) by mouth daily. Swallow whole.   BIOTIN W/ VITAMINS C & E (HAIR/SKIN/NAILS PO)    Take 1  tablet by mouth daily.   CALCIUM CARB-CHOLECALCIFEROL (CALCIUM 500 + D PO)    Take by mouth.   CITALOPRAM (CELEXA) 10 MG TABLET    Take 1 tablet (10 mg total) by mouth daily.   DORZOLAMIDE-TIMOLOL (COSOPT) 2-0.5 % OPHTHALMIC SOLUTION    Place into the right eye.   EZETIMIBE (ZETIA) 10 MG TABLET    Take 1 tablet (10 mg total) by mouth daily.   GABAPENTIN (NEURONTIN) 300 MG CAPSULE    TAKE ONE OR TWO CAPSULES BY MOUTH THREE TIMES DAILY (UP TO 5 CAPSULES DAILY   HYDRALAZINE (APRESOLINE) 50 MG TABLET    Take 1 tablet (50 mg total) by mouth 2 (two) times daily.   KRILL OIL 350 MG CAPS    Take 1 capsule by mouth daily.   LAMOTRIGINE (LAMICTAL) 150 MG TABLET    Take 1 tablet (150 mg total) by mouth 3 (three) times daily.   LATANOPROST (XALATAN) 0.005 % OPHTHALMIC SOLUTION    Place 2 drops into both eyes at bedtime.   LOSARTAN (COZAAR) 25 MG TABLET    Take 1 tablet (25 mg total) by mouth daily.   MECLIZINE (ANTIVERT) 25 MG TABLET    Take 1 tablet (25 mg total) by mouth daily as needed for dizziness.   MULTIPLE VITAMIN (MULTIVITAMIN) TABLET    Take 1 tablet by mouth daily.   TIMOLOL (TIMOPTIC) 0.5 % OPHTHALMIC SOLUTION    Place 1 drop into both eyes 2 (two) times daily.  Modified Medications   No medications on file  Discontinued Medications   No medications on file    Physical Exam:  Vitals:   01/20/23 1052  BP: 122/80  Pulse: 76  Temp: 97.9 F (36.6 C)  TempSrc: Temporal  SpO2: 94%  Weight: 135 lb (61.2 kg)  Height: 5' 3.5" (1.613 m)   Body mass index is 23.54 kg/m. Wt Readings from Last 3 Encounters:  01/20/23 135 lb (61.2 kg)  12/23/22 136 lb (61.7 kg)  12/09/22 135 lb (61.2 kg)    Physical Exam Constitutional:      General: She is not in acute distress.    Appearance: She is well-developed. She is not diaphoretic.  HENT:     Head: Normocephalic and atraumatic.     Mouth/Throat:     Pharynx: No oropharyngeal exudate.  Eyes:     Conjunctiva/sclera: Conjunctivae normal.      Pupils: Pupils are equal, round, and reactive to light.  Cardiovascular:     Rate and Rhythm: Normal rate and regular rhythm.  Heart sounds: Normal heart sounds.  Pulmonary:     Effort: Pulmonary effort is normal.     Breath sounds: Normal breath sounds.  Abdominal:     General: Bowel sounds are normal.     Palpations: Abdomen is soft.  Musculoskeletal:     Cervical back: Normal range of motion and neck supple.     Right lower leg: No edema.     Left lower leg: No edema.  Skin:    General: Skin is warm and dry.  Neurological:     Mental Status: She is alert.  Psychiatric:        Mood and Affect: Mood normal.     Labs reviewed: Basic Metabolic Panel: Recent Labs    06/17/22 1220 10/14/22 1134  NA 137 135  K 4.5 4.6  CL 100 100  CO2 30 29  GLUCOSE 86 75  BUN 15 13  CREATININE 0.73 0.77  CALCIUM 10.1 9.5   Liver Function Tests: Recent Labs    06/17/22 1220 10/14/22 1134  AST 21 19  ALT 17 14  BILITOT 0.4 0.5  PROT 7.1 6.6   No results for input(s): "LIPASE", "AMYLASE" in the last 8760 hours. No results for input(s): "AMMONIA" in the last 8760 hours. CBC: Recent Labs    06/17/22 1220  WBC 5.1  NEUTROABS 2,703  HGB 13.2  HCT 39.7  MCV 93.2  PLT 263   Lipid Panel: Recent Labs    06/17/22 1220 10/14/22 1134  CHOL 273* 216*  HDL 106 115  LDLCALC 151* 87  TRIG 56 54  CHOLHDL 2.6 1.9   TSH: No results for input(s): "TSH" in the last 8760 hours. A1C: Lab Results  Component Value Date   HGBA1C 5.4 01/16/2022     Assessment/Plan 1. Bradycardia Improved off metoprolol  2. Primary hypertension -controlled off metoprolol, will continue losartan and dietary modifications at this time   3. Dizziness Has improved with stopping metoprolol. Educated to use meclizine only as needed   4. Mild depression -she has been on lexapro 10 mg for several months per chart, unsure if she decreased dose but she will plan on checking when she gets home.   Will continue lifestyle modifications- increasing physical activity and getting outside. Has sitter that helps her maintain activity level as well.  Follow up as scheduled- sooner if symptoms worsen.   Janene Harvey. [facility] [phone]

## 2023-01-20 NOTE — Patient Instructions (Addendum)
To check to see if you are taking citalopram 10 mg daily when you get home.    Only take meclizine if you are having dizziness. - take this medication as needed   Recommend to increase physical activity.  Get outside every day.

## 2023-01-24 DIAGNOSIS — H52203 Unspecified astigmatism, bilateral: Secondary | ICD-10-CM | POA: Diagnosis not present

## 2023-01-24 DIAGNOSIS — H401132 Primary open-angle glaucoma, bilateral, moderate stage: Secondary | ICD-10-CM | POA: Diagnosis not present

## 2023-01-24 DIAGNOSIS — G500 Trigeminal neuralgia: Secondary | ICD-10-CM | POA: Diagnosis not present

## 2023-01-24 DIAGNOSIS — H35361 Drusen (degenerative) of macula, right eye: Secondary | ICD-10-CM | POA: Diagnosis not present

## 2023-01-24 DIAGNOSIS — Z961 Presence of intraocular lens: Secondary | ICD-10-CM | POA: Diagnosis not present

## 2023-01-24 DIAGNOSIS — H524 Presbyopia: Secondary | ICD-10-CM | POA: Diagnosis not present

## 2023-01-24 DIAGNOSIS — H5211 Myopia, right eye: Secondary | ICD-10-CM | POA: Diagnosis not present

## 2023-01-25 ENCOUNTER — Other Ambulatory Visit: Payer: Self-pay | Admitting: Neurology

## 2023-01-27 ENCOUNTER — Telehealth: Payer: Self-pay | Admitting: Neurology

## 2023-01-27 ENCOUNTER — Other Ambulatory Visit: Payer: Self-pay

## 2023-01-27 MED ORDER — GABAPENTIN 300 MG PO CAPS: ORAL_CAPSULE | ORAL | 0 refills | Status: DC

## 2023-01-27 MED ORDER — LAMOTRIGINE 150 MG PO TABS: 150.0000 mg | ORAL_TABLET | Freq: Three times a day (TID) | ORAL | 0 refills | Status: DC

## 2023-01-27 NOTE — Telephone Encounter (Signed)
Last seen on 08/07/22 per note " Reduce gabapentin to one pill three times a day." Follow up scheduled on 02/24/23  Per 10/24/22 my chart message " if your facial symptoms are getting worse you can go back up to 3 times a day for the lamotrigine. "  Rx's sent.

## 2023-01-27 NOTE — Telephone Encounter (Signed)
Pt called and LVM stating that she is almost out of her lamoTRIgine (LAMICTAL) 150 MG tablet and her gabapentin (NEURONTIN) 300 MG capsule and she is needing a refill request sent in to the [facility] on [address] [place]

## 2023-01-31 DIAGNOSIS — G500 Trigeminal neuralgia: Secondary | ICD-10-CM | POA: Diagnosis not present

## 2023-01-31 DIAGNOSIS — Z923 Personal history of irradiation: Secondary | ICD-10-CM | POA: Diagnosis not present

## 2023-02-10 ENCOUNTER — Ambulatory Visit (INDEPENDENT_AMBULATORY_CARE_PROVIDER_SITE_OTHER): Payer: Medicare Other | Admitting: Nurse Practitioner

## 2023-02-10 ENCOUNTER — Encounter: Payer: Self-pay | Admitting: Nurse Practitioner

## 2023-02-10 VITALS — BP 110/70 | HR 69 | Temp 97.9°F | Ht 63.5 in | Wt 135.0 lb

## 2023-02-10 DIAGNOSIS — E2839 Other primary ovarian failure: Secondary | ICD-10-CM | POA: Diagnosis not present

## 2023-02-10 DIAGNOSIS — Z0000 Encounter for general adult medical examination without abnormal findings: Secondary | ICD-10-CM

## 2023-02-10 NOTE — Progress Notes (Signed)
Subjective:   Crystal Huber is a 77 y.o. female who presents for Medicare Annual (Subsequent) preventive examination.  Visit Complete: In person at [facility]         Objective:    Today's Vitals   02/10/23 1057  BP: 110/70  Pulse: 69  Temp: 97.9 F (36.6 C)  SpO2: 98%  Weight: 135 lb (61.2 kg)  Height: 5' 3.5" (1.613 m)   Body mass index is 23.54 kg/m.     02/10/2023   11:15 AM 01/20/2023   10:53 AM 12/23/2022   11:13 AM 10/14/2022    9:27 AM 06/17/2022    9:46 AM 01/16/2022    6:00 PM 01/14/2022   11:47 AM  Advanced Directives  Does Patient Have a Medical Advance Directive? Yes Yes Yes Yes Yes Yes Yes  Type of Estate agent of [place];Living will Healthcare Power of [place];Living will Healthcare Power of [place];Living will Healthcare Power of [place];Living will Healthcare Power of [place];Living will Healthcare Power of [employer] of [place];Living will  Does patient want to make changes to medical advance directive? No - Patient declined No - Patient declined No - Patient declined No - Patient declined No - Patient declined No - Patient declined No - Patient declined  Copy of Healthcare Power of Attorney in Chart? Yes - validated most recent copy scanned in chart (See row information) Yes - validated most recent copy scanned in chart (See row information) Yes - validated most recent copy scanned in chart (See row information) Yes - validated most recent copy scanned in chart (See row information) Yes - validated most recent copy scanned in chart (See row information) No - copy requested Yes - validated most recent copy scanned in chart (See row information)    Current Medications (verified) Outpatient Encounter Medications as of 02/10/2023  Medication Sig   aspirin EC 81 MG tablet Take 1 tablet (81 mg total) by mouth daily. Swallow whole.   Biotin w/ Vitamins C & E (HAIR/SKIN/NAILS PO) Take 1 tablet by mouth daily.   Calcium  Carb-Cholecalciferol (CALCIUM 500 + D PO) Take by mouth.   citalopram (CELEXA) 10 MG tablet Take 1 tablet (10 mg total) by mouth daily.   ezetimibe (ZETIA) 10 MG tablet Take 1 tablet (10 mg total) by mouth daily.   gabapentin (NEURONTIN) 300 MG capsule TAKE ONE CAPSULE BY MOUTH THREE TIMES DAILY   hydrALAZINE (APRESOLINE) 50 MG tablet Take 1 tablet (50 mg total) by mouth 2 (two) times daily.   Krill Oil 350 MG CAPS Take 1 capsule by mouth daily.   lamoTRIgine (LAMICTAL) 150 MG tablet Take 1 tablet (150 mg total) by mouth 3 (three) times daily.   latanoprost (XALATAN) 0.005 % ophthalmic solution Place 2 drops into both eyes at bedtime.   losartan (COZAAR) 25 MG tablet Take 1 tablet (25 mg total) by mouth daily.   meclizine (ANTIVERT) 25 MG tablet Take 1 tablet (25 mg total) by mouth daily as needed for dizziness.   Multiple Vitamin (MULTIVITAMIN) tablet Take 1 tablet by mouth daily.   timolol (TIMOPTIC) 0.5 % ophthalmic solution Place 1 drop into both eyes 2 (two) times daily.   dorzolamide-timolol (COSOPT) 2-0.5 % ophthalmic solution Place into the right eye. (Patient not taking: Reported on 02/10/2023)   No facility-administered encounter medications on file as of 02/10/2023.    Allergies (verified) Carbamazepine, Imipramine, Metronidazole, Oxcarbazepine, Penicillins, Red blood cells, Statins, and Sulfa antibiotics   History: Past Medical History:  Diagnosis  Date   Arthritis    right pinkie   Cancer (HCC)    right BR  CA    Cataract    right eye   GERD (gastroesophageal reflux disease)    prn tums, mild not often   Glaucoma    Hyperlipidemia    Hypertension    Multiple sclerosis (HCC)    Neuromuscular disorder (HCC)    trigeminal neuralgia   Refusal of blood product    Trigeminal neuralgia of left side of face    Past Surgical History:  Procedure Laterality Date   BREAST LUMPECTOMY Right    wears prosthesis   CATARACT EXTRACTION Left 05/2022   COLONOSCOPY  12/20/2010    ganglion cyst removal Bilateral    NERVE SURGERY  05/2022   PARTIAL HYSTERECTOMY     POLYPECTOMY     Family History  Problem Relation Age of Onset   Congestive Heart Failure Mother    Heart disease Father    Heart attack Father    Stroke Sister    Breast cancer Sister    Heart disease Sister    Heart disease Brother    Heart disease Brother    Heart disease Brother    Colon cancer Neg Hx    Colon polyps Neg Hx    Esophageal cancer Neg Hx    Rectal cancer Neg Hx    Stomach cancer Neg Hx    Social History   Socioeconomic History   Marital status: Widowed    Spouse name: Not on file   Number of children: Not on file   Years of education: Not on file   Highest education level: Not on file  Occupational History   Not on file  Tobacco Use   Smoking status: Former    Types: Cigarettes   Smokeless tobacco: Never   Tobacco comments:    Quit smoking at age 73 and only smoke occasional x 1.5 years   Vaping Use   Vaping status: Never Used  Substance and Sexual Activity   Alcohol use: Yes    Comment: 1 glass of wine a night   Drug use: No   Sexual activity: Not on file  Other Topics Concern   Not on file  Social History Narrative   Diet      Do you drink/eat things with caffeine      Marital Status     What year were you married?      Do you live in a house, apartment, assisted living, condo, trailer, etc.?      Is it one or more stories?      How many persons live in your home?         Do you have any pets in your home?(please list)      Highest level of education completed:      Current or past profession:      Do you exercise?:    Type and how often:      Do you have a Living Will? (Form that indicates scenarios where you would not want your life prolonged)      Do you have a DNR form?         If not, would you like to discuss one?      Do you have signed POA/HPOA forms? Yes      Do you have difficulty bathing or dressing yourself?      Do you have  difficulty preparing food or eating?  Do you have difficulty managing medications?      Do you have difficulty managing your finances?      Do you have difficulty affording your medications?                     Social Determinants of Health   Financial Resource Strain: Not on file  Food Insecurity: No Food Insecurity (01/16/2022)   Hunger Vital Sign    Worried About Running Out of Food in the Last Year: Never true    Ran Out of Food in the Last Year: Never true  Transportation Needs: No Transportation Needs (01/16/2022)   PRAPARE - Administrator, Civil Service (Medical): No    Lack of Transportation (Non-Medical): No  Physical Activity: Not on file  Stress: Not on file  Social Connections: Unknown (07/29/2021)   [facility]   Social Network    Social Network: Not on file    Tobacco Counseling Counseling given: Not Answered Tobacco comments: Quit smoking at age 83 and only smoke occasional x 1.5 years    Clinical Intake:                        Activities of Daily Living     No data to display          Patient Care Team: Sharon Seller, NP as PCP - General (Geriatric Medicine)  Indicate any recent Medical Services you may have received from other than [facility] providers in the past year (date may be approximate).     Assessment:   This is a routine wellness examination for Haaniya.  Hearing/Vision screen Hearing Screening - Comments:: No hearing concerns.  Vision Screening - Comments:: Some vision concerns. Patient last eye exam November 2024. Patient wears prescription glasses.    Goals Addressed   None    Depression Screen    02/10/2023   11:09 AM 12/20/2021    9:37 AM 06/25/2021    1:07 PM 12/12/2020   10:17 AM  PHQ 2/9 Scores  PHQ - 2 Score 0 0 0 0    Fall Risk    02/10/2023   11:09 AM 10/14/2022   11:06 AM 01/14/2022   11:47 AM 12/20/2021    9:37 AM 06/25/2021    1:06 PM  Fall Risk    Falls in the past year? 0 1 1 1  0  Number falls in past yr: 0 1 0 0 0  Injury with Fall? 0 0 1 1 0  Risk for fall due to : No Fall Risks History of fall(s);Impaired balance/gait History of fall(s);Impaired balance/gait History of fall(s) No Fall Risks  Follow up Falls evaluation completed;Education provided;Falls prevention discussed Falls evaluation completed Falls evaluation completed Falls evaluation completed Falls evaluation completed    MEDICARE RISK AT HOME:    TIMED UP AND GO:  Was the test performed?  No    Cognitive Function:    02/10/2023   11:10 AM 02/04/2022    1:37 PM 03/27/2020    2:03 PM 03/27/2020    1:40 PM  MMSE - Mini Mental State Exam  Orientation to time 4 5 3 3   Orientation to Place 5 5 5 5   Registration 3 3 3 3   Attention/ Calculation 5 1 2 2   Recall 3 3 3 3   Language- name 2 objects 2 2 2 2   Language- repeat 1 1 1 1   Language- follow 3 step command  2 3 3 3   Language- read & follow direction 1 1 1 1   Write a sentence 1 1 1 1   Copy design 1 1 1 1   Total score 28 26 25 25         12/20/2021    9:41 AM 12/12/2020   10:20 AM  6CIT Screen  What Year? 0 points 0 points  What month? 0 points 0 points  What time? 0 points 0 points  Count back from 20 0 points 0 points  Months in reverse 2 points 0 points  Repeat phrase 0 points 0 points  Total Score 2 points 0 points    Immunizations Immunization History  Administered Date(s) Administered   Fluad Quad(high Dose 65+) 12/18/2020   Fluad Trivalent(High Dose 65+) 12/23/2022   Influenza Split 02/25/2013, 12/20/2013, 01/16/2016   Influenza, High Dose Seasonal PF 01/22/2017, 12/18/2018, 01/26/2020, 12/19/2021   Influenza,inj,Quad PF,6+ Mos 01/16/2016   Influenza,inj,quad, With Preservative 12/20/2013   Influenza-Unspecified 01/10/2010, 02/04/2011, 03/17/2012   Moderna Covid-19 Fall Seasonal Vaccine 76yrs & older 12/30/2022   PFIZER Comirnaty(Gray Top)Covid-19 Tri-Sucrose Vaccine 09/07/2020    PFIZER(Purple Top)SARS-COV-2 Vaccination 07/14/2019, 09/23/2019, 12/28/2019   Pneumococcal Conjugate-13 11/11/2016, 12/26/2021   Pneumococcal Polysaccharide-23 11/11/2011   Td 10/16/2010   Tdap 10/16/2010   Unspecified SARS-COV-2 Vaccination 12/26/2021    TDAP status: Due, Education has been provided regarding the importance of this vaccine. Advised may receive this vaccine at local pharmacy or Health Dept. Aware to provide a copy of the vaccination record if obtained from local pharmacy or Health Dept. Verbalized acceptance and understanding.  Flu Vaccine status: Up to date  Pneumococcal vaccine status: Up to date  Covid-19 vaccine status: Information provided on how to obtain vaccines.   Qualifies for Shingles Vaccine? Yes   Zostavax completed No   Shingrix Completed?: No.    Education has been provided regarding the importance of this vaccine. Patient has been advised to call insurance company to determine out of pocket expense if they have not yet received this vaccine. Advised may also receive vaccine at local pharmacy or Health Dept. Verbalized acceptance and understanding.  Screening Tests Health Maintenance  Topic Date Due   Zoster Vaccines- Shingrix (1 of 2) Never done   DEXA SCAN  Never done   DTaP/Tdap/Td (3 - Td or Tdap) 10/15/2020   Medicare Annual Wellness (AWV)  02/10/2024   Pneumonia Vaccine 4+ Years old  Completed   INFLUENZA VACCINE  Completed   COVID-19 Vaccine  Completed   Hepatitis C Screening  Completed   HPV VACCINES  Aged Out   Colonoscopy  Discontinued    Health Maintenance  Health Maintenance Due  Topic Date Due   Zoster Vaccines- Shingrix (1 of 2) Never done   DEXA SCAN  Never done   DTaP/Tdap/Td (3 - Td or Tdap) 10/15/2020    Colorectal cancer screening: No longer required.   Mammogram status: No longer required due to age.  Bone Density status: Ordered today. Pt provided with contact info and advised to call to schedule appt.  Lung  Cancer Screening: (Low Dose CT Chest recommended if Age 62-80 years, 20 pack-year currently smoking OR have quit w/in 15years.) does not qualify.   Lung Cancer Screening Referral: na  Additional Screening:  Hepatitis C Screening: does qualify; Completed   Vision Screening: Recommended annual ophthalmology exams for early detection of glaucoma and other disorders of the eye. Is the patient up to date with their annual eye exam?  Yes  Who is the  provider or what is the name of the office in which the patient attends annual eye exams? [facility] If pt is not established with a provider, would they like to be referred to a provider to establish care? No .   Dental Screening: Recommended annual dental exams for proper oral hygiene  Community Resource Referral / Chronic Care Management: CRR required this visit?  No   CCM required this visit?  No     Plan:     I have personally reviewed and noted the following in the patient's chart:   Medical and social history Use of alcohol, tobacco or illicit drugs  Current medications and supplements including opioid prescriptions. Patient is not currently taking opioid prescriptions. Functional ability and status Nutritional status Physical activity Advanced directives List of other physicians Hospitalizations, surgeries, and ER visits in previous 12 months Vitals Screenings to include cognitive, depression, and falls Referrals and appointments  In addition, I have reviewed and discussed with patient certain preventive protocols, quality metrics, and best practice recommendations. A written personalized care plan for preventive services as well as general preventive health recommendations were provided to patient.     Sharon Seller, NP   02/10/2023

## 2023-02-10 NOTE — Patient Instructions (Addendum)
  Ms. Pean , Thank you for taking time to come for your Medicare Wellness Visit. I appreciate your ongoing commitment to your health goals. Please review the following plan we discussed and let me know if I can assist you in the future.   Please call [phone] to schedule bone density  To get TDAP and shingles series at local pharmacy- tell them to send [place] your records.     This is a list of the screening recommended for you and due dates:  Health Maintenance  Topic Date Due   Zoster (Shingles) Vaccine (1 of 2) Never done   DEXA scan (bone density measurement)  Never done   DTaP/Tdap/Td vaccine (3 - Td or Tdap) 10/15/2020   Medicare Annual Wellness Visit  02/10/2024   Pneumonia Vaccine  Completed   Flu Shot  Completed   COVID-19 Vaccine  Completed   Hepatitis C Screening  Completed   HPV Vaccine  Aged Out   Colon Cancer Screening  Discontinued

## 2023-02-24 ENCOUNTER — Ambulatory Visit: Payer: Medicare Other | Admitting: Neurology

## 2023-02-24 ENCOUNTER — Encounter: Payer: Self-pay | Admitting: Neurology

## 2023-02-24 VITALS — Ht 63.5 in | Wt 134.5 lb

## 2023-02-24 DIAGNOSIS — R269 Unspecified abnormalities of gait and mobility: Secondary | ICD-10-CM | POA: Diagnosis not present

## 2023-02-24 DIAGNOSIS — F413 Other mixed anxiety disorders: Secondary | ICD-10-CM

## 2023-02-24 DIAGNOSIS — G35 Multiple sclerosis: Secondary | ICD-10-CM | POA: Diagnosis not present

## 2023-02-24 DIAGNOSIS — G500 Trigeminal neuralgia: Secondary | ICD-10-CM | POA: Diagnosis not present

## 2023-02-24 NOTE — Progress Notes (Signed)
[facility]  PATIENT: Crystal Huber DOB: [birth year]  REFERRING DOCTOR OR PCP:  Burnett Kanaris SOURCE: Patient  _________________________________   HISTORICAL  CHIEF COMPLAINT:  Chief Complaint  Patient presents with   Follow-up    Pt in room 10, caregiver in room.  Here for MS follow up. Patient reports doing well, patient reports she has dizziness but takes Meclizine which helps. Patient said she may need eye surgery per eye doctor.    HISTORY OF PRESENT ILLNESS:  Crystal Huber is a 77 y.o. woman with relapsing remitting MS diagnosed in 2002.  Update  02/24/2023 She feels her MS is stable.  She has been off Rebif since 2022.  She has had no exacerbations or significant new neurologic symptoms.   She continues to experience some vertigo -similar to the last visit.  .Bending over and back up may trigger a spell so she is careful.   She was doing exercises learned in PT but has not done lately.  She is taking meclizine once a day     Her shower has grab bar and is a walk in.  Her gait is off balanced, She uses a cane more the past year.   On a good day, she can walk a few hundred feet maybe up to a quarter mile.    She denies much weakness or numbness.  Her left trigeminal neuralgia  did better after gamma knife.  However, it still flares up at times.   She is on Lamotrigine 150 mg po tid and gabapentin 300 mg po tid usually but up to 5 during a flare (she just started higher dose).  On this regimen she has generally done well.  Before the gamma knofe, she had an [facility] [place] with  benefit x 1 month, last done about 1 years ago (Dr. Marca Ancona).   Baclofen caused sleepiness.   Carbamezapine caused hyponatremia.   She had gamma knife at [facility] in February 2024 (Dr. Angelyn Punt and Dr. Johny Drilling) and she feels a lot of benefit.  Before the gamma knife she had a thin section MRI showing a T1 hypodensity near the left trigeminal entry  zone.  She has mild cognitive but feels stable and MMSE was 28/30.  Short-term memory issues make her forgetful at times..  Usually she does better with hints.   She is not driving. .   Mood is doing well.  Notes feeling sad but no definite depression. She has some anxiety.   She feels less anxiety now that she has a roommate.  She sleeps well most nights.    She tries to eat well and exercise some.    Labs 03/03/2020 showed low Vit D (24) and she takes supplements.   TSH was fine.  B12 was normal.   LFT and Lymphocytes were fine   `     02/10/2023   11:10 AM 02/04/2022    1:37 PM 03/27/2020    2:03 PM  MMSE - Mini Mental State Exam  Orientation to time 4 5 3   Orientation to Place 5 5 5   Registration 3 3 3   Attention/ Calculation 5 1 2   Recall 3 3 3   Language- name 2 objects 2 2 2   Language- repeat 1 1 1   Language- follow 3 step command 2 3 3   Language- read & follow direction 1 1 1   Write a sentence 1 1 1   Copy design 1 1 1   Total score 28 26 25  02/04/22: She lost the 4 points due to serial sevens.  She would have scored 30/30 spelling world backwards.    MS History:   She had MRI and LP consistent with the diagnosis of MS in 2002 after presenting with trigeminal neuralgia.   She was started on Rebif.   She is on Rebif 22 mcg, tolerates it well and has had no definite exacerbation since.       MRI of the brain 09/19/2016 shows classic MS lesions.  There were a few foci not present in 2012.  MRI of the brain 12/21/2020 showed no new lesions.   REVIEW OF SYSTEMS: Constitutional: No fevers, chills, sweats, or change in appetite.   Fatigue, worse as the day goes on. Eyes: No visual changes, double vision, eye pain Ear, nose and throat: Has vertigo, No hearing loss, ear pain, nasal congestion, sore throat Cardiovascular: No chest pain, palpitations Respiratory:  No shortness of breath at rest or with exertion.   No wheezes GastrointestinaI: No nausea, vomiting, diarrhea,  abdominal pain, fecal incontinence Genitourinary:  No dysuria, urinary retention or frequency.  No nocturia. Musculoskeletal:  No neck pain, back pain Integumentary: No rash, pruritus, skin lesions Neurological: as above Psychiatric: Some depression and anxiety. Endocrine: No palpitations, diaphoresis, change in appetite, change in weigh or increased thirst Hematologic/Lymphatic:  No anemia, purpura, petechiae..   Bruises easily Allergic/Immunologic: No itchy/runny eyes, nasal congestion, recent allergic reactions, rashes  ALLERGIES: Allergies  Allergen Reactions   Carbamazepine     Decreased sodium level   Imipramine     Night sweats, more intense dreams   Metronidazole Nausea Only   Oxcarbazepine     Decreased sodium level   Penicillins Swelling    Yeast infection   Red Blood Cells Other (See Comments)    Jehovah's witness, does not want blood products   Statins     Gets hyponatremia and severe muscle pain   Sulfa Antibiotics Swelling    yeast infection    HOME MEDICATIONS:  Current Outpatient Medications:    aspirin EC 81 MG tablet, Take 1 tablet (81 mg total) by mouth daily. Swallow whole., Disp: 30 tablet, Rfl: 12   Biotin w/ Vitamins C & E (HAIR/SKIN/NAILS PO), Take 1 tablet by mouth daily., Disp: , Rfl:    Calcium Carb-Cholecalciferol (CALCIUM 500 + D PO), Take by mouth., Disp: , Rfl:    citalopram (CELEXA) 10 MG tablet, Take 1 tablet (10 mg total) by mouth daily., Disp: 90 tablet, Rfl: 3   ezetimibe (ZETIA) 10 MG tablet, Take 1 tablet (10 mg total) by mouth daily., Disp: 90 tablet, Rfl: 3   gabapentin (NEURONTIN) 300 MG capsule, TAKE ONE CAPSULE BY MOUTH THREE TIMES DAILY, Disp: 270 capsule, Rfl: 0   hydrALAZINE (APRESOLINE) 50 MG tablet, Take 1 tablet (50 mg total) by mouth 2 (two) times daily., Disp: 180 tablet, Rfl: 3   Krill Oil 350 MG CAPS, Take 1 capsule by mouth daily., Disp: , Rfl:    lamoTRIgine (LAMICTAL) 150 MG tablet, Take 1 tablet (150 mg total) by mouth  3 (three) times daily., Disp: 270 tablet, Rfl: 0   latanoprost (XALATAN) 0.005 % ophthalmic solution, Place 2 drops into both eyes at bedtime., Disp: , Rfl:    meclizine (ANTIVERT) 25 MG tablet, Take 1 tablet (25 mg total) by mouth daily as needed for dizziness., Disp: 30 tablet, Rfl: 0   Multiple Vitamin (MULTIVITAMIN) tablet, Take 1 tablet by mouth daily., Disp: , Rfl:    timolol (TIMOPTIC)  0.5 % ophthalmic solution, Place 1 drop into both eyes 2 (two) times daily., Disp: , Rfl:    dorzolamide-timolol (COSOPT) 2-0.5 % ophthalmic solution, Place into the right eye. (Patient not taking: Reported on 02/10/2023), Disp: , Rfl:    losartan (COZAAR) 25 MG tablet, Take 1 tablet (25 mg total) by mouth daily. (Patient not taking: Reported on 02/24/2023), Disp: 90 tablet, Rfl: 1  PAST MEDICAL HISTORY: Past Medical History:  Diagnosis Date   Arthritis    right pinkie   Cancer (HCC)    right BR  CA    Cataract    right eye   GERD (gastroesophageal reflux disease)    prn tums, mild not often   Glaucoma    Hyperlipidemia    Hypertension    Multiple sclerosis (HCC)    Neuromuscular disorder (HCC)    trigeminal neuralgia   Refusal of blood product    Trigeminal neuralgia of left side of face     PAST SURGICAL HISTORY: Past Surgical History:  Procedure Laterality Date   BREAST LUMPECTOMY Right    wears prosthesis   CATARACT EXTRACTION Left 05/2022   COLONOSCOPY  12/20/2010   ganglion cyst removal Bilateral    NERVE SURGERY  05/2022   PARTIAL HYSTERECTOMY     POLYPECTOMY      FAMILY HISTORY: Family History  Problem Relation Age of Onset   Congestive Heart Failure Mother    Heart disease Father    Heart attack Father    Stroke Sister    Breast cancer Sister    Heart disease Sister    Heart disease Brother    Heart disease Brother    Heart disease Brother    Colon cancer Neg Hx    Colon polyps Neg Hx    Esophageal cancer Neg Hx    Rectal cancer Neg Hx    Stomach cancer Neg Hx      SOCIAL HISTORY:  Social History   Socioeconomic History   Marital status: Widowed    Spouse name: Not on file   Number of children: Not on file   Years of education: Not on file   Highest education level: Not on file  Occupational History   Not on file  Tobacco Use   Smoking status: Former    Types: Cigarettes   Smokeless tobacco: Never   Tobacco comments:    Quit smoking at age 82 and only smoke occasional x 1.5 years   Vaping Use   Vaping status: Never Used  Substance and Sexual Activity   Alcohol use: Yes    Comment: 1 glass of wine a night   Drug use: No   Sexual activity: Not on file  Other Topics Concern   Not on file  Social History Narrative   Diet      Do you drink/eat things with caffeine      Marital Status     What year were you married?      Do you live in a house, apartment, assisted living, condo, trailer, etc.?      Is it one or more stories?      How many persons live in your home?         Do you have any pets in your home?(please list)      Highest level of education completed:      Current or past profession:      Do you exercise?:    Type and how often:  Do you have a Living Will? (Form that indicates scenarios where you would not want your life prolonged)      Do you have a DNR form?         If not, would you like to discuss one?      Do you have signed POA/HPOA forms? Yes      Do you have difficulty bathing or dressing yourself?      Do you have difficulty preparing food or eating?      Do you have difficulty managing medications?      Do you have difficulty managing your finances?      Do you have difficulty affording your medications?                     Social Determinants of Health   Financial Resource Strain: Not on file  Food Insecurity: No Food Insecurity (01/16/2022)   Hunger Vital Sign    Worried About Running Out of Food in the Last Year: Never true    Ran Out of Food in the Last Year: Never true   Transportation Needs: No Transportation Needs (01/16/2022)   PRAPARE - Administrator, Civil Service (Medical): No    Lack of Transportation (Non-Medical): No  Physical Activity: Not on file  Stress: Not on file  Social Connections: Unknown (07/29/2021)   Received from [facility]   Social Network    Social Network: Not on file  Intimate Partner Violence: Not At Risk (01/16/2022)   Humiliation, Afraid, Rape, and Kick questionnaire    Fear of Current or Ex-Partner: No    Emotionally Abused: No    Physically Abused: No    Sexually Abused: No     PHYSICAL EXAM  Vitals:   02/24/23 1059  Weight: 134 lb 8 oz (61 kg)  Height: 5' 3.5" (1.613 m)    Body mass index is 23.45 kg/m.    General: The patient is well-developed and well-nourished and in no acute distress  Neck:  The neck has good range of motion.  Neurologic Exam  Mental status: The patient is alert and oriented x 3 at the time of the examination. The patient has apparent normal recent and remote memory, with an apparently normal attention span and concentration ability.   Speech is normal.  Cranial nerves: Extraocular movements are full.  There is no nystagmus.  Facial strength and sensation was normal.  She has reduced left-sided hearing.. The Weber does not lateralize.   Motor: Tremor not notable. Muscle bulk is normal.    normal muscle tone in the arms strength is  5 / 5 in all 4 extremities.   Sensory: She has normal symmetric sensation to touch and vibration.  Coordination: Cerebellar testing shows good finger nose finger and heel to shin.   Gait and station: Station is normal.  She has a wide gait without cane, better with cane.  The tandem is wide but probably better than last visit..  Romberg was negative.  Reflexes: Deep tendon reflexes are symmetric and normal bilaterally.        ASSESSMENT AND PLAN  Multiple sclerosis (HCC)  Trigeminal neuralgia  Gait disorder  Other  mixed anxiety disorders   1.   She will remain off the Rebif.  In 2025, we will check another MRI of the brain to ensure that there has been stability.  If progression has occurred we would need to reconsider restarting a disease modifying therapy. 2.  TN pain is better since gamma knife and we should be able to reduce the medications.   Reduce Lamotrigine to 150 mg po bid and gabapentin 300 mg bid to tid (can go up to 4/day if flare)  for Trigeminal neuralgia.     Likely due to the MS as no definite vascular loop seen on MRI.    Might be able to drop further 3.   Although she has noted some mild memory loss, cognition is stable.    Advised not to drive.  She has an aide 4.   Continue to be active.  Gait is stable.   Continue vitamin D supplements  Rtc 6 months, sooner if problems   This visit is part of a comprehensive longitudinal care medical relationship regarding the patients primary diagnosis of multiple sclerosis and related concerns.   Shirlie Enck A. Epimenio Foot, MD, PhD 02/24/2023, 11:40 AM Certified in Neurology, Clinical Neurophysiology, Sleep Medicine, Pain Medicine and Neuroimaging  [facility] [address], [address] [place], [place] [address] [phone]

## 2023-02-26 ENCOUNTER — Other Ambulatory Visit: Payer: Medicare Other

## 2023-03-03 ENCOUNTER — Inpatient Hospital Stay: Admission: RE | Admit: 2023-03-03 | Payer: Medicare Other | Source: Ambulatory Visit

## 2023-03-25 ENCOUNTER — Other Ambulatory Visit: Payer: Self-pay | Admitting: Nurse Practitioner

## 2023-03-25 DIAGNOSIS — R42 Dizziness and giddiness: Secondary | ICD-10-CM

## 2023-03-25 NOTE — Telephone Encounter (Signed)
 Patient only given 30 day supply for last refill. Medication pend and sent to PCP Janyth Contes Janene Harvey, NP

## 2023-04-02 ENCOUNTER — Ambulatory Visit (HOSPITAL_BASED_OUTPATIENT_CLINIC_OR_DEPARTMENT_OTHER): Payer: Medicare Other

## 2023-04-16 ENCOUNTER — Other Ambulatory Visit: Payer: Self-pay | Admitting: Neurology

## 2023-04-25 ENCOUNTER — Other Ambulatory Visit (HOSPITAL_COMMUNITY): Payer: Self-pay

## 2023-04-25 ENCOUNTER — Other Ambulatory Visit: Payer: Self-pay

## 2023-04-25 ENCOUNTER — Ambulatory Visit: Payer: Medicare Other | Admitting: Nurse Practitioner

## 2023-04-25 MED ORDER — LATANOPROST 0.005 % OP SOLN: 2.0000 [drp] | Freq: Every day | OPHTHALMIC | 0 refills | Status: DC

## 2023-04-25 NOTE — Telephone Encounter (Signed)
 Patient wants a refill for medication, But there is a high risk warning

## 2023-04-29 ENCOUNTER — Other Ambulatory Visit: Payer: Self-pay

## 2023-04-30 ENCOUNTER — Encounter: Payer: Self-pay | Admitting: Nurse Practitioner

## 2023-04-30 ENCOUNTER — Ambulatory Visit (INDEPENDENT_AMBULATORY_CARE_PROVIDER_SITE_OTHER): Payer: Medicare Other | Admitting: Nurse Practitioner

## 2023-04-30 VITALS — BP 116/74 | HR 66 | Temp 97.6°F | Resp 17 | Ht 63.5 in | Wt 134.0 lb

## 2023-04-30 DIAGNOSIS — F32A Depression, unspecified: Secondary | ICD-10-CM | POA: Diagnosis not present

## 2023-04-30 DIAGNOSIS — D225 Melanocytic nevi of trunk: Secondary | ICD-10-CM | POA: Diagnosis not present

## 2023-04-30 DIAGNOSIS — I10 Essential (primary) hypertension: Secondary | ICD-10-CM

## 2023-04-30 DIAGNOSIS — I700 Atherosclerosis of aorta: Secondary | ICD-10-CM | POA: Diagnosis not present

## 2023-04-30 DIAGNOSIS — R42 Dizziness and giddiness: Secondary | ICD-10-CM

## 2023-04-30 DIAGNOSIS — Z1283 Encounter for screening for malignant neoplasm of skin: Secondary | ICD-10-CM | POA: Diagnosis not present

## 2023-04-30 DIAGNOSIS — F419 Anxiety disorder, unspecified: Secondary | ICD-10-CM

## 2023-04-30 MED ORDER — SERTRALINE HCL 50 MG PO TABS: 50.0000 mg | ORAL_TABLET | Freq: Every day | ORAL | 1 refills | Status: DC

## 2023-04-30 NOTE — Progress Notes (Signed)
Careteam: Patient Care Team: Crystal Seller, NP as PCP - General (Geriatric Medicine)   PLACE OF SERVICE: [facility]  Advanced Directive information Does Patient Have a Medical Advance Directive?: Yes, Type of Advance Directive: Healthcare Power of [place];Living will, Does patient want to make changes to medical advance directive?: No - Patient declined   Allergies  Allergen Reactions   Carbamazepine     Decreased sodium level   Imipramine     Night sweats, more intense dreams   Metronidazole Nausea Only   Oxcarbazepine     Decreased sodium level   Penicillins Swelling    Yeast infection   Red Blood Cells Other (See Comments)    Jehovah's witness, does not want blood products   Statins     Gets hyponatremia and severe muscle pain   Sulfa Antibiotics Swelling    yeast infection     Chief Complaint  Patient presents with   Medical Management of Chronic Issues    3 month follow up and discuss dexa scan,shingles,and tdap vaccines.      HPI: Patient is a 78 y.o. female presents for a 47-month follow-up.  Reports occasionally feeling her "heart flutter" and some shortness of breath with exertion. States this has been going on for a while. Denies chest pain. Also feeling more fatigued and like she has no energy some days.  Reports still feeling dizzy, takes meclizine 1x/day as needed; uses a cane for safety. Feels "blue" but doesn't think she's depressed. She goes out with her friends often.  Does report anxiety and reports she has a lot of this.   Trigeminal myalgia, takes lamotrigine and gabapentin.  Review of Systems:  Review of Systems  Constitutional:  Positive for malaise/fatigue. Negative for chills, fever and weight loss.  Respiratory:  Positive for shortness of breath. Negative for cough.   Cardiovascular:  Positive for palpitations. Negative for chest pain and leg swelling.  Gastrointestinal:  Negative for constipation, diarrhea, nausea and vomiting.   Genitourinary:  Negative for dysuria, frequency and urgency.  Musculoskeletal:  Positive for myalgias. Negative for falls.  Neurological:  Positive for dizziness. Negative for weakness and headaches.  Psychiatric/Behavioral:  Positive for depression. Negative for memory loss. The patient is nervous/anxious.        Sleep disturbances, has bad sleep hygiene    Past Medical History:  Diagnosis Date   Arthritis    right pinkie   Cancer (HCC)    right BR  CA    Cataract    right eye   GERD (gastroesophageal reflux disease)    prn tums, mild not often   Glaucoma    Hyperlipidemia    Hypertension    Multiple sclerosis (HCC)    Neuromuscular disorder (HCC)    trigeminal neuralgia   Refusal of blood product    Trigeminal neuralgia of left side of face     Past Surgical History:  Procedure Laterality Date   BREAST LUMPECTOMY Right    wears prosthesis   CATARACT EXTRACTION Left 05/2022   COLONOSCOPY  12/20/2010   ganglion cyst removal Bilateral    NERVE SURGERY  05/2022   PARTIAL HYSTERECTOMY     POLYPECTOMY       Social History:   reports that she has quit smoking. Her smoking use included cigarettes. She has never used smokeless tobacco. She reports current alcohol use. She reports that she does not use drugs.  Family History  Problem Relation Age of Onset   Congestive Heart Failure Mother  Heart disease Father    Heart attack Father    Stroke Sister    Breast cancer Sister    Heart disease Sister    Heart disease Brother    Heart disease Brother    Heart disease Brother    Colon cancer Neg Hx    Colon polyps Neg Hx    Esophageal cancer Neg Hx    Rectal cancer Neg Hx    Stomach cancer Neg Hx      Medications:  Patient's Medications  New Prescriptions   SERTRALINE (ZOLOFT) 50 MG TABLET    Take 1 tablet (50 mg total) by mouth daily.  Previous Medications   ASPIRIN EC 81 MG TABLET    Take 1 tablet (81 mg total) by mouth daily. Swallow whole.   BIOTIN W/ VITAMINS  C & E (HAIR/SKIN/NAILS PO)    Take 1 tablet by mouth daily.   CALCIUM CARB-CHOLECALCIFEROL (CALCIUM 500 + D PO)    Take by mouth.   EZETIMIBE (ZETIA) 10 MG TABLET    Take 1 tablet (10 mg total) by mouth daily.   GABAPENTIN (NEURONTIN) 300 MG CAPSULE    TAKE 1 CAPSULE BY MOUTH THREE TIMES DAILY   HYDRALAZINE (APRESOLINE) 50 MG TABLET    Take 1 tablet (50 mg total) by mouth 2 (two) times daily.   KRILL OIL 350 MG CAPS    Take 1 capsule by mouth daily.   LAMOTRIGINE (LAMICTAL) 150 MG TABLET    Take 1 tablet (150 mg total) by mouth 3 (three) times daily.   LATANOPROST (XALATAN) 0.005 % OPHTHALMIC SOLUTION    Place 2 drops into both eyes at bedtime.   MECLIZINE (ANTIVERT) 25 MG TABLET    TAKE 1 TABLET(25 MG) BY MOUTH DAILY AS NEEDED FOR DIZZINESS   MULTIPLE VITAMIN (MULTIVITAMIN) TABLET    Take 1 tablet by mouth daily.   TIMOLOL (TIMOPTIC) 0.5 % OPHTHALMIC SOLUTION    Place 1 drop into both eyes 2 (two) times daily.  Modified Medications   No medications on file  Discontinued Medications   CITALOPRAM (CELEXA) 10 MG TABLET    Take 1 tablet (10 mg total) by mouth daily.   DORZOLAMIDE-TIMOLOL (COSOPT) 2-0.5 % OPHTHALMIC SOLUTION    Place into the right eye.   LOSARTAN (COZAAR) 25 MG TABLET    Take 1 tablet (25 mg total) by mouth daily.     Physical Exam:  Vitals:   04/30/23 1348  BP: 116/74  Pulse: 66  Resp: 17  Temp: 97.6 F (36.4 C)  SpO2: 96%  Weight: 134 lb (60.8 kg)  Height: 5' 3.5" (1.613 m)   Body mass index is 23.36 kg/m.  Wt Readings from Last 3 Encounters:  04/30/23 134 lb (60.8 kg)  02/24/23 134 lb 8 oz (61 kg)  02/10/23 135 lb (61.2 kg)     Physical Exam Constitutional:      Appearance: Normal appearance.  Cardiovascular:     Rate and Rhythm: Normal rate and regular rhythm.     Pulses: Normal pulses.     Heart sounds: Normal heart sounds.  Pulmonary:     Effort: Pulmonary effort is normal.     Breath sounds: Normal breath sounds.  Abdominal:     General: Bowel  sounds are normal.     Palpations: Abdomen is soft.  Skin:    General: Skin is warm and dry.  Neurological:     General: No focal deficit present.     Mental Status: She is  alert and oriented to person, place, and time.  Psychiatric:        Mood and Affect: Mood normal.        Behavior: Behavior normal.     Labs reviewed: Basic Metabolic Panel:  Recent Labs    06/17/22 1220 10/14/22 1134  NA 137 135  K 4.5 4.6  CL 100 100  CO2 30 29  GLUCOSE 86 75  BUN 15 13  CREATININE 0.73 0.77  CALCIUM 10.1 9.5   Liver Function Tests:  Recent Labs    06/17/22 1220 10/14/22 1134  AST 21 19  ALT 17 14  BILITOT 0.4 0.5  PROT 7.1 6.6   No results for input(s): "LIPASE", "AMYLASE" in the last 8760 hours. No results for input(s): "AMMONIA" in the last 8760 hours. CBC:  Recent Labs    06/17/22 1220  WBC 5.1  NEUTROABS 2,703  HGB 13.2  HCT 39.7  MCV 93.2  PLT 263   Lipid Panel:  Recent Labs    06/17/22 1220 10/14/22 1134  CHOL 273* 216*  HDL 106 115  LDLCALC 151* 87  TRIG 56 54  CHOLHDL 2.6 1.9   TSH: No results for input(s): "TSH" in the last 8760 hours. A1C:  Lab Results  Component Value Date   HGBA1C 5.4 01/16/2022     Assessment/Plan   1. Primary hypertension (Primary) -BP controlled, at goal, <140/90 -Continues to feel dizzy, BP on lower end of normal, will stop losartan for now, continue hydralazine -Encouraged dietary modifications and physical activity as tolerated - EKG 12-Lead in office, normal EKG - CBC with Differential/Platelet - Complete Metabolic Panel with eGFR  2. Dizziness -Feeling dizzy more often again -Stop losartan, continue hydralazine -PRN meclizine, emphasized importance of only taking when needed -Fall precautions  3. Mild depression -Unsure if citalopram is helping, still feels "blue"; will switch to sertraline -Continue lifestyle modifications - TSH - sertraline (ZOLOFT) 50 MG tablet; Take 1 tablet (50 mg total) by mouth daily.   Dispense: 30 tablet; Refill: 1  4. Anxiety -Unsure if citalopram is helping, still feels anxious; will switch to sertraline - TSH - sertraline (ZOLOFT) 50 MG tablet; Take 1 tablet (50 mg total) by mouth daily.  Dispense: 30 tablet; Refill: 1  5. Aortic atherosclerosis (HCC) -Seen on imaging -Continue aspirin and ezetimibe (unable to tolerate statins)   Return in about 4 weeks (around 05/28/2023) for mood, blood pressure.:  Rollen Sox, [facility] MSN-FNP Student -I personally was present during the history, physical exam and medical decision-making activities of this service and have verified that the service and findings are accurately documented in the student's note Keerthana Vanrossum K. Biagio Borg [facility] [phone]

## 2023-04-30 NOTE — Patient Instructions (Addendum)
Stop losartan   To get tdap and shingles vaccine at local pharmacy   Stop celexa and start zoloft next day for anxiety/mood

## 2023-05-01 ENCOUNTER — Encounter: Payer: Self-pay | Admitting: Nurse Practitioner

## 2023-05-01 LAB — CBC WITH DIFFERENTIAL/PLATELET
Absolute Lymphocytes: 1360 {cells}/uL (ref 850–3900)
Absolute Monocytes: 738 {cells}/uL (ref 200–950)
Basophils Absolute: 43 {cells}/uL (ref 0–200)
Basophils Relative: 0.7 %
Eosinophils Absolute: 92 {cells}/uL (ref 15–500)
Eosinophils Relative: 1.5 %
HCT: 41.3 % (ref 35.0–45.0)
Hemoglobin: 13.4 g/dL (ref 11.7–15.5)
MCH: 31.2 pg (ref 27.0–33.0)
MCHC: 32.4 g/dL (ref 32.0–36.0)
MCV: 96.3 fL (ref 80.0–100.0)
MPV: 10.7 fL (ref 7.5–12.5)
Monocytes Relative: 12.1 %
Neutro Abs: 3867 {cells}/uL (ref 1500–7800)
Neutrophils Relative %: 63.4 %
Platelets: 259 10*3/uL (ref 140–400)
RBC: 4.29 10*6/uL (ref 3.80–5.10)
RDW: 12.6 % (ref 11.0–15.0)
Total Lymphocyte: 22.3 %
WBC: 6.1 10*3/uL (ref 3.8–10.8)

## 2023-05-01 LAB — COMPLETE METABOLIC PANEL WITH GFR
AG Ratio: 1.9 (calc) (ref 1.0–2.5)
ALT: 12 U/L (ref 6–29)
AST: 15 U/L (ref 10–35)
Albumin: 4.4 g/dL (ref 3.6–5.1)
Alkaline phosphatase (APISO): 96 U/L (ref 37–153)
BUN: 19 mg/dL (ref 7–25)
CO2: 33 mmol/L — ABNORMAL HIGH (ref 20–32)
Calcium: 9.6 mg/dL (ref 8.6–10.4)
Chloride: 100 mmol/L (ref 98–110)
Creat: 0.84 mg/dL (ref 0.60–1.00)
Globulin: 2.3 g/dL (ref 1.9–3.7)
Glucose, Bld: 90 mg/dL (ref 65–139)
Potassium: 3.9 mmol/L (ref 3.5–5.3)
Sodium: 140 mmol/L (ref 135–146)
Total Bilirubin: 0.3 mg/dL (ref 0.2–1.2)
Total Protein: 6.7 g/dL (ref 6.1–8.1)
eGFR: 72 mL/min/{1.73_m2} (ref >=60)

## 2023-05-01 LAB — TSH: TSH: 1.41 m[IU]/L (ref 0.40–4.50)

## 2023-05-05 ENCOUNTER — Ambulatory Visit (HOSPITAL_BASED_OUTPATIENT_CLINIC_OR_DEPARTMENT_OTHER): Payer: Medicare Other

## 2023-05-06 ENCOUNTER — Telehealth: Payer: Self-pay

## 2023-05-06 NOTE — Telephone Encounter (Signed)
 Message left on clinical intake voicemail:   Patient seen Dr.Evan and was changed from citalopram to zoloft and has a question for Sharon Seller, NP.  I returned call to patient and left a message asking for specific detail and I also suggested that she send a mychart message as an alternative. Awaiting reply

## 2023-05-11 ENCOUNTER — Other Ambulatory Visit: Payer: Self-pay | Admitting: Nurse Practitioner

## 2023-05-11 DIAGNOSIS — R42 Dizziness and giddiness: Secondary | ICD-10-CM

## 2023-05-12 NOTE — Telephone Encounter (Signed)
 RX was only approved for 30 last month. Please advise if ok to approve x 1 year

## 2023-05-14 ENCOUNTER — Ambulatory Visit (HOSPITAL_BASED_OUTPATIENT_CLINIC_OR_DEPARTMENT_OTHER)
Admission: RE | Admit: 2023-05-14 | Discharge: 2023-05-14 | Disposition: A | Discharge status: Home / Self Care | Payer: Medicare Other | Source: Ambulatory Visit | Attending: Nurse Practitioner | Admitting: Nurse Practitioner

## 2023-05-14 DIAGNOSIS — E2839 Other primary ovarian failure: Secondary | ICD-10-CM | POA: Diagnosis present

## 2023-05-14 DIAGNOSIS — M810 Age-related osteoporosis without current pathological fracture: Secondary | ICD-10-CM | POA: Diagnosis not present

## 2023-05-14 DIAGNOSIS — Z780 Asymptomatic menopausal state: Secondary | ICD-10-CM | POA: Diagnosis not present

## 2023-05-15 DIAGNOSIS — H401132 Primary open-angle glaucoma, bilateral, moderate stage: Secondary | ICD-10-CM | POA: Diagnosis not present

## 2023-05-15 DIAGNOSIS — H52223 Regular astigmatism, bilateral: Secondary | ICD-10-CM | POA: Diagnosis not present

## 2023-05-15 DIAGNOSIS — H18453 Nodular corneal degeneration, bilateral: Secondary | ICD-10-CM | POA: Diagnosis not present

## 2023-05-15 DIAGNOSIS — H04123 Dry eye syndrome of bilateral lacrimal glands: Secondary | ICD-10-CM | POA: Diagnosis not present

## 2023-05-15 DIAGNOSIS — Z961 Presence of intraocular lens: Secondary | ICD-10-CM | POA: Diagnosis not present

## 2023-05-15 DIAGNOSIS — H5213 Myopia, bilateral: Secondary | ICD-10-CM | POA: Diagnosis not present

## 2023-05-16 ENCOUNTER — Encounter: Payer: Self-pay | Admitting: Nurse Practitioner

## 2023-05-16 ENCOUNTER — Other Ambulatory Visit: Payer: Self-pay | Admitting: Adult Health

## 2023-05-19 ENCOUNTER — Other Ambulatory Visit: Payer: Self-pay

## 2023-05-21 ENCOUNTER — Telehealth: Payer: Self-pay | Admitting: Neurology

## 2023-05-21 ENCOUNTER — Other Ambulatory Visit: Payer: Self-pay | Admitting: Neurology

## 2023-05-21 ENCOUNTER — Encounter: Payer: Self-pay | Admitting: Neurology

## 2023-05-21 MED ORDER — LAMOTRIGINE 150 MG PO TABS: 300.0000 mg | ORAL_TABLET | Freq: Two times a day (BID) | ORAL | 1 refills | Status: DC

## 2023-05-21 NOTE — Telephone Encounter (Signed)
 Patient said  Dr. Angelyn Punt cannot do surgery until 6/12. Asking if Dr. Epimenio Foot give something else or increase Gabapentin or Lamotrigine  for Trigeminal Neuralgia until surgery.

## 2023-05-21 NOTE — Telephone Encounter (Signed)
 Left message on VM for patient following up on form fee

## 2023-05-21 NOTE — Telephone Encounter (Signed)
 Called pt back. Confirmed she is taking both medications below as prescribed. However, she has had to take extra doses for worsening TN pain. Sometimes, she will take both QID.  She is scheduled to see Dr. Jeannett Senior Tatter/Neurosurgery to do repeat Gamma Knife 08/28/23. Aware I will send to Dr. Epimenio Foot to review and call back with his recommendation.

## 2023-05-22 ENCOUNTER — Telehealth: Payer: Self-pay | Admitting: Neurology

## 2023-05-22 NOTE — Telephone Encounter (Signed)
 Called and LVM for pt to call

## 2023-05-22 NOTE — Telephone Encounter (Signed)
 Pt called and did not want to give to much information just that she is needing to discuss her lamoTRIgine (LAMICTAL) 150 MG tablet with RN or MD. Please advise.

## 2023-05-22 NOTE — Telephone Encounter (Signed)
 Dr. Epimenio Foot sent mychart to pt yesterday but she has not read it:

## 2023-05-22 NOTE — Telephone Encounter (Signed)
 Took call from phone room and spoke w/ pt. Relayed Dr. Bonnita Hollow recommendation. She verbalized understanding.

## 2023-05-26 DIAGNOSIS — Z0289 Encounter for other administrative examinations: Secondary | ICD-10-CM

## 2023-05-26 NOTE — Telephone Encounter (Signed)
 Received payment giving to POD 1 for review

## 2023-05-27 ENCOUNTER — Other Ambulatory Visit: Payer: Self-pay | Admitting: Nurse Practitioner

## 2023-05-27 DIAGNOSIS — R42 Dizziness and giddiness: Secondary | ICD-10-CM

## 2023-05-28 ENCOUNTER — Telehealth: Payer: Self-pay

## 2023-05-28 NOTE — Telephone Encounter (Signed)
 Copied from CRM [id number]. Topic: Clinical - Lab/Test Results >> May 28, 2023  2:09 PM Corin V wrote: Reason for CRM: Patient is calling to get the results of her bone density test. She is having trouble viewing these in MyChart. 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 Would recommend her starting treatment to avoid fracture. Just to clarify she has NOT been on medication for this in the past? Would recommend making an appt to discuss this with provider. Also for all patients with thinning of the bone we also recommend to take calcium 600 mg twice daily with Vitamin D 2000 units daily and weight bearing activity 30 mins/5 days a week    Overall patient needs to call and schedule an appointment to further discuss

## 2023-05-30 ENCOUNTER — Ambulatory Visit: Payer: Medicare Other | Admitting: Nurse Practitioner

## 2023-06-05 DIAGNOSIS — Z510 Encounter for antineoplastic radiation therapy: Secondary | ICD-10-CM | POA: Diagnosis not present

## 2023-06-05 DIAGNOSIS — G500 Trigeminal neuralgia: Secondary | ICD-10-CM | POA: Diagnosis not present

## 2023-06-09 ENCOUNTER — Telehealth: Payer: Self-pay | Admitting: Neurology

## 2023-06-09 ENCOUNTER — Encounter: Payer: Self-pay | Admitting: Neurology

## 2023-06-09 ENCOUNTER — Other Ambulatory Visit: Payer: Self-pay | Admitting: Neurology

## 2023-06-09 MED ORDER — TRAMADOL HCL 50 MG PO TABS: 50.0000 mg | ORAL_TABLET | Freq: Three times a day (TID) | ORAL | 2 refills | Status: DC | PRN

## 2023-06-09 NOTE — Telephone Encounter (Signed)
 Pt reports that the [facility] hospital that did her Gamma Knife procedure last Thursday told her that Dr Epimenio Foot would be able to call in a pain medication for her, pt is asking for a call from RN to discuss this request.

## 2023-06-09 NOTE — Telephone Encounter (Signed)
 Pt last saw Dr. Epimenio Foot 02/24/23. Dx: MS, TN, anxiety.   Benard Rink. Angelyn Punt, M.D., Ph.D. performed procedure 06/05/23. Procedure note can be viewed in epic.  I called pt. D/c'd home on tylenol prn for pain management. Dr. Angelyn Punt called in Journavx (suzetrigine) 50 mg bid but they are working on getting approval through her insurance. They called her today and will know decision hopefully tomorrow if approved by insurance.   Dr. Angelyn Punt told her Journavx typical treatment and should contact Dr. Epimenio Foot for other pain management options.  She is having severe pain. Needing something for next two days to help with pain. Not wanting anything long term. Aware I will send to Dr. Epimenio Foot to review and will call back.  Pharmacy: [id number] - [place], [place] - [address]

## 2023-06-11 ENCOUNTER — Telehealth: Payer: Self-pay | Admitting: Neurology

## 2023-06-11 ENCOUNTER — Telehealth: Payer: Self-pay | Admitting: Nurse Practitioner

## 2023-06-11 NOTE — Telephone Encounter (Signed)
 We do not prescribe eye drops and needs to contact her eye doctor in regards to this. Please keep follow up apt on Friday

## 2023-06-11 NOTE — Telephone Encounter (Signed)
 Copied from CRM [id number]. Topic: Clinical - Medication Question >> Jun 11, 2023  1:56 PM Everette Rank wrote: Reason for CRM: Patient caleld in on 2 medications:  traMADol (ULTRAM) 50 MG tablet-Patient stated this is too strong what else can she take Aleeve or  Tylenol or anything else what would they suggest? Needs call back  latanoprost (XALATAN) 0.005 % ophthalmic solution-Patient states this Medication runs out to fast requesting bigger bottles or more bottles. Needs call back  [phone]

## 2023-06-11 NOTE — Telephone Encounter (Addendum)
 Mychart message sent to patient with providers response, I also left patient a detailed voicemail with response

## 2023-06-11 NOTE — Telephone Encounter (Signed)
 Pt is asking for a call to discuss her concerns that the  traMADol (ULTRAM) 50 MG tablet  may be too strong for her, the 1 she took caused her to sleep all day, today she has not had any, she would like a call to discuss maybe cutting in half. Phone rep informed pt that Dr Epimenio Foot sent her a my chart, pt states she does not use my chart, please call pt.

## 2023-06-11 NOTE — Telephone Encounter (Signed)
 Called pt and relayed she can try 1/2 tablet tramadol instead per MD. She verbalized understanding. She plans tol first try to take Tylenol and if ineffective, will try tramadol again.

## 2023-06-11 NOTE — Telephone Encounter (Signed)
 Wenda Overland   06/11/2023  4:45 PM  Patient called back. She would like clarification on what over the counter pain medication would be best for her due to the tramadol being too strong.    Ricarda Frame M   06/11/2023  4:08 PM  Patient called back. States she couldn't get to her phone and missed the call earlier. Advised patient that per the notes, the nurse was calling to advise her that she needs to contact her eye doctor in regards to her eye drops & also Dr. Janyth Contes stated per notes for her to keep her follow up appt on Friday. Patient states she will contact her eye doctor. She also stated that she will "try" to keep her follow up appt for Friday because she has a sore throat & she's been in bed. Advised her that we can switch appt to virtual if she needs to be virtual but patient stated she will call back and let us know on Thursday if she will be able to come in. Explained to her how the video call process works.     Please advise on OTC recommendations per patient request

## 2023-06-11 NOTE — Telephone Encounter (Signed)
**Note De-identified  [place] Obfuscation** Please advise 

## 2023-06-11 NOTE — Telephone Encounter (Signed)
 Dr. Epimenio Foot- ok for her to cut tablet in half to try to see if she tolerates better?   I reviewed chart. Dr. Epimenio Foot sent mychart letting pt know he sent in tramadol 06/09/23. See below.

## 2023-06-12 ENCOUNTER — Other Ambulatory Visit: Payer: Self-pay | Admitting: Adult Health

## 2023-06-12 NOTE — Telephone Encounter (Signed)
 Pt called stating that provider did not fill out his part of the forms and she is needing this as soon as possible. She states that the office should have a copy but if not she would like a call so that she can get a ride and drop it off at the office. Please advise.

## 2023-06-12 NOTE — Telephone Encounter (Signed)
Call returned to patient and/or family member, a detailed message was left with the providers reply.

## 2023-06-12 NOTE — Telephone Encounter (Signed)
 Neurology prescribed the pain medication and she has placed a call to them for this. Would recommend they advise since they prescribed. We can discuss it tomorrow as well.

## 2023-06-12 NOTE — Telephone Encounter (Signed)
 Noted, thank you

## 2023-06-12 NOTE — Telephone Encounter (Signed)
 [name] would like an answer today with OTC recommendations

## 2023-06-13 ENCOUNTER — Ambulatory Visit: Admitting: Nurse Practitioner

## 2023-06-13 ENCOUNTER — Telehealth: Payer: Self-pay | Admitting: Neurology

## 2023-06-13 ENCOUNTER — Encounter: Admitting: Nurse Practitioner

## 2023-06-13 NOTE — Telephone Encounter (Signed)
 Pt is asking if a muscle relaxer can be called in for her in place of the tramadol, please call.

## 2023-06-13 NOTE — Progress Notes (Signed)
 Left a voicemail for patient three times and no answer.   Spoke with patient niece and stated that she is no longer patient Midwife and we have contacted the patient three times.

## 2023-06-16 ENCOUNTER — Ambulatory Visit: Payer: Self-pay

## 2023-06-16 ENCOUNTER — Other Ambulatory Visit: Payer: Self-pay | Admitting: Neurology

## 2023-06-16 ENCOUNTER — Encounter: Payer: Self-pay | Admitting: Neurology

## 2023-06-16 MED ORDER — BACLOFEN 10 MG PO TABS: 5.0000 mg | ORAL_TABLET | Freq: Three times a day (TID) | ORAL | 0 refills | Status: DC

## 2023-06-16 NOTE — Telephone Encounter (Signed)
 Copied From CRM [id number]. Reason for Triage: Patients friend Tobi Bastos called stating patient had some type of radiation a few weeks ago and since then the patient has been in pain. Tobi Bastos also states patient is someone who suffers from MS. Tobi Bastos was not with the patient and think it would be best to contact patient for additional info [phone] (M)  1st attempt; left message.

## 2023-06-16 NOTE — Telephone Encounter (Signed)
Message routed to PCP Eubanks, Jessica K, NP  

## 2023-06-16 NOTE — Telephone Encounter (Signed)
  Chief Complaint: Facial Pain-hx of Trigeminal Neurologia Symptoms: Similar facial pain Frequency: "for a long period of time" Pertinent Negatives: Patient denies CP, SOB, fever Disposition: [] ED /[] Urgent Care (no appt availability in office) / [x] Appointment(In office/virtual)/ []  [facility]/ [] Home Care/ [] Refused Recommended Disposition /[] [facility]/ []  Follow-up with PCP Additional Notes: patient calling back with the main idea of making an office visit appointment to see PCP. 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 Patient states pain is about the same. Office visit made for patient to be seen by PCP. Patient states she is wanting to have her check up done for her PCP. Patient verbalized no new or other concerns at this time.    Copied From CRM [id number]. Reason for Triage: Patients friend Tobi Bastos called stating patient had some type of radiation a few weeks ago and since then the patient has been in pain. Tobi Bastos also states patient is someone who suffers from MS. Tobi Bastos was not with the patient and think it would be best to contact patient for additional info [phone] (M)    Reason for Disposition  Face pain  is a chronic symptom (recurrent or ongoing AND present > 4 weeks)  Answer Assessment - Initial Assessment Questions 1. ONSET: "When did the pain start?" (e.g., minutes, hours, days)     Been going on for awhile-trigeminal neuroglia  2. ONSET: "Does the pain come and go, or has it been constant since it started?" (e.g., constant, intermittent, fleeting)     intermittent 3. SEVERITY: "How bad is the pain?"   (Scale 1-10; mild, moderate or severe)   - MILD (1-3): doesn't interfere with normal activities    - MODERATE (4-7): interferes with normal activities or awakens from sleep    - SEVERE (8-10): excruciating pain, unable to do any normal activities      8 out of 10 4. LOCATION: "Where does it hurt?"      face 5.  RASH: "Is there any redness, rash, or swelling of the face?"     no 6. FEVER: "Do you have a fever?" If Yes, ask: "What is it, how was it measured, and when did it start?"      no 7. OTHER SYMPTOMS: "Do you have any other symptoms?" (e.g., fever, toothache, nasal discharge, nasal congestion, clicking sensation in jaw joint)     no  Protocols used: Face Pain-A-AH

## 2023-06-16 NOTE — Telephone Encounter (Signed)
 Noted, she had virtual on Friday but was unable to be reached.

## 2023-06-16 NOTE — Telephone Encounter (Signed)
Ok, Thank you for letting me know.  

## 2023-06-23 ENCOUNTER — Ambulatory Visit: Admitting: Nurse Practitioner

## 2023-06-24 NOTE — Telephone Encounter (Signed)
 Error

## 2023-06-27 ENCOUNTER — Other Ambulatory Visit: Payer: Self-pay | Admitting: Nurse Practitioner

## 2023-06-27 DIAGNOSIS — F419 Anxiety disorder, unspecified: Secondary | ICD-10-CM

## 2023-06-27 DIAGNOSIS — F32A Depression, unspecified: Secondary | ICD-10-CM

## 2023-07-01 ENCOUNTER — Ambulatory Visit: Payer: Self-pay

## 2023-07-01 NOTE — Telephone Encounter (Signed)
 1st attempt to return call, no answer and left detailed message (see Fyi, DPR) to follow up with [facility] radiation oncologist Dr. Elihu Grumet who prescribed Medrol.   Copied from CRM [id number]. Topic: Clinical - Medication Question >> Jul 01, 2023  3:26 PM Brynn Caras wrote: Reason for CRM: The patient received Medrol (MethylPREDNISolone) 4MG  from her pharmacy - [facility] with the sig: Take 1 tablet (4 mg total) by mouth. She is unsure which provider signed off on this medication and she is unsure if she should start taking this. I advised a clinical staffmember can confirm this further for her. The patient is requesting a callback with an update. Callback #[phone] Reason for Disposition . Message left on identified voice mail  Protocols used: No Contact or Duplicate Contact Call-A-AH

## 2023-07-01 NOTE — Telephone Encounter (Signed)
Message routed to PCP Eubanks, Jessica K, NP  

## 2023-07-02 NOTE — Telephone Encounter (Signed)
 Thank you for the update, let me know if she has any additional questions.

## 2023-07-02 NOTE — Telephone Encounter (Signed)
 Message routed to CI coverage.

## 2023-07-03 ENCOUNTER — Telehealth: Payer: Self-pay | Admitting: Neurology

## 2023-07-03 NOTE — Telephone Encounter (Signed)
 Pt is requesting a refill for lamoTRIgine (LAMICTAL) 150 MG tablet &  gabapentin (NEURONTIN) 300 MG capsule  .  Pharmacy: [facility] [phone]

## 2023-07-03 NOTE — Telephone Encounter (Signed)
 Pt last seen 02/24/23 and next f/u 09/08/23.  Refills should be on file at pharmacy for both. I called pt.  She is experiencing increased TN pain. Taking lamotrigine 150mg  1 pill po TID. She did not pick up increased dose sent 05/21/23. States pharmacy would not fill until they confirmed dose with MD. Aware I will call to confirm this. I called pharmacy and spoke w/ pharmacist. They got paid claim for lamotrigine increased dose and will get this ready for pt.    Also taking gabapentin 300mg  1 cap po TID.  Pt aware refills for both at pharmacy.

## 2023-07-14 ENCOUNTER — Encounter: Payer: Self-pay | Admitting: Nurse Practitioner

## 2023-07-14 ENCOUNTER — Ambulatory Visit (INDEPENDENT_AMBULATORY_CARE_PROVIDER_SITE_OTHER): Admitting: Nurse Practitioner

## 2023-07-14 VITALS — BP 134/76 | HR 74 | Temp 97.9°F | Ht 63.5 in | Wt 129.4 lb

## 2023-07-14 DIAGNOSIS — F32A Depression, unspecified: Secondary | ICD-10-CM | POA: Insufficient documentation

## 2023-07-14 DIAGNOSIS — G500 Trigeminal neuralgia: Secondary | ICD-10-CM | POA: Diagnosis not present

## 2023-07-14 DIAGNOSIS — E7800 Pure hypercholesterolemia, unspecified: Secondary | ICD-10-CM

## 2023-07-14 DIAGNOSIS — I10 Essential (primary) hypertension: Secondary | ICD-10-CM

## 2023-07-14 DIAGNOSIS — G35 Multiple sclerosis: Secondary | ICD-10-CM | POA: Diagnosis not present

## 2023-07-14 DIAGNOSIS — K219 Gastro-esophageal reflux disease without esophagitis: Secondary | ICD-10-CM

## 2023-07-14 DIAGNOSIS — R42 Dizziness and giddiness: Secondary | ICD-10-CM

## 2023-07-14 DIAGNOSIS — F419 Anxiety disorder, unspecified: Secondary | ICD-10-CM | POA: Diagnosis not present

## 2023-07-14 DIAGNOSIS — M810 Age-related osteoporosis without current pathological fracture: Secondary | ICD-10-CM

## 2023-07-14 DIAGNOSIS — R269 Unspecified abnormalities of gait and mobility: Secondary | ICD-10-CM | POA: Diagnosis not present

## 2023-07-14 MED ORDER — SERTRALINE HCL 25 MG PO TABS: 25.0000 mg | ORAL_TABLET | Freq: Every day | ORAL | 1 refills | Status: DC

## 2023-07-14 NOTE — Assessment & Plan Note (Signed)
 Recommended to take calcium 600 mg twice daily with Vitamin D 1000 units daily and weight bearing activity 30 mins/5 days a week

## 2023-07-14 NOTE — Progress Notes (Signed)
 Careteam: Patient Care Team: Verma Gobble, NP as PCP - General (Geriatric Medicine)  PLACE OF SERVICE:  [facility]  Advanced Directive information Does Patient Have a Medical Advance Directive?: Yes, Type of Advance Directive: Healthcare Power of [place];Out of facility DNR (pink MOST or yellow form);Living will, Does patient want to make changes to medical advance directive?: No - Patient declined  Allergies  Allergen Reactions   Carbamazepine      Decreased sodium level   Imipramine      Night sweats, more intense dreams   Metronidazole Nausea Only   Oxcarbazepine     Decreased sodium level   Penicillins Swelling    Yeast infection   Red Blood Cells Other (See Comments)    Jehovah's witness, does not want blood products   Statins     Gets hyponatremia and severe muscle pain   Sulfa Antibiotics Swelling    yeast infection    Chief Complaint  Patient presents with   Medical Management of Chronic Issues    Medical Management of Chronic Issues. Follow up blood pressure and mood.    HPI:  Discussed the use of AI scribe software for clinical note transcription with the patient, who gave verbal consent to proceed.  History of Present Illness   Crystal Huber is a 78 year old female with depression, anxiety, and hypertension who presents for follow-up on mood and blood pressure management.  Her mood was not well managed on Celexa , leading to a switch to Zoloft . She discontinued Zoloft  three days ago due to excessive sleepiness and grogginess, which affected her daily activities. She experiences ongoing anxiety, particularly related to personal and family matters, but denies feeling overly sad. Anxiety is more prominent than depression. She finds some relief from anxiety by organizing and cleaning her home, sleeping early, and discarding items.   She was previously on losartan  for blood pressure management but stopped it due to low blood pressure and associated dizziness.  Dizziness has improved but still occurs occasionally when she turns quickly. She uses a cane for stability and is cautious to avoid falls. She does not monitor her blood pressure at home but has the equipment to do so. Her current medications include hydralazine  for blood pressure, meclizine  for dizziness, lamotrigine  and gabapentin  for trigeminal neuralgia, and Zetia  for cholesterol. She takes meclizine  daily to manage dizziness and reports that stopping it would prevent her from standing up. She also takes calcium, vitamin D3, and low-dose aspirin  as part of her daily regimen.  She reports a decreased appetite and unintentional weight loss, often forgetting to eat. She enjoys butter pecan ice cream. No issues with constipation or diarrhea, attributing regularity to consuming Activia yogurt.       Review of Systems:  Review of Systems  Constitutional:  Negative for chills, fever and weight loss.  HENT:  Negative for tinnitus.   Respiratory:  Negative for cough, sputum production and shortness of breath.   Cardiovascular:  Negative for chest pain, palpitations and leg swelling.  Gastrointestinal:  Negative for abdominal pain, constipation, diarrhea and heartburn.  Genitourinary:  Negative for dysuria, frequency and urgency.  Musculoskeletal:  Negative for back pain, falls, joint pain and myalgias.  Skin: Negative.   Neurological:  Negative for dizziness and headaches.  Psychiatric/Behavioral:  Positive for depression and memory loss. The patient is nervous/anxious. The patient does not have insomnia.     Past Medical History:  Diagnosis Date   Arthritis    right pinkie  Cancer (HCC)    right BR  CA    Cataract    right eye   GERD (gastroesophageal reflux disease)    prn tums, mild not often   Glaucoma    Hyperlipidemia    Hypertension    Macrocytic anemia 09/12/2013   Multiple sclerosis (HCC)    Neuromuscular disorder (HCC)    trigeminal neuralgia   Refusal of blood product     Trigeminal neuralgia of left side of face    Past Surgical History:  Procedure Laterality Date   BREAST LUMPECTOMY Right    wears prosthesis   CATARACT EXTRACTION Left 05/2022   COLONOSCOPY  12/20/2010   ganglion cyst removal Bilateral    NERVE SURGERY  05/2022   PARTIAL HYSTERECTOMY     POLYPECTOMY     Social History:   reports that she has quit smoking. Her smoking use included cigarettes. She has never used smokeless tobacco. She reports current alcohol  use. She reports that she does not use drugs.  Family History  Problem Relation Age of Onset   Congestive Heart Failure Mother    Heart disease Father    Heart attack Father    Stroke Sister    Breast cancer Sister    Heart disease Sister    Heart disease Brother    Heart disease Brother    Heart disease Brother    Colon cancer Neg Hx    Colon polyps Neg Hx    Esophageal cancer Neg Hx    Rectal cancer Neg Hx    Stomach cancer Neg Hx     Medications: Patient's Medications  New Prescriptions   No medications on file  Previous Medications   ASPIRIN  EC 81 MG TABLET    Take 1 tablet (81 mg total) by mouth daily. Swallow whole.   BACLOFEN  (LIORESAL ) 10 MG TABLET    Take 0.5-1 tablets (5-10 mg total) by mouth 3 (three) times daily.   BIOTIN W/ VITAMINS C & E (HAIR/SKIN/NAILS PO)    Take 1 tablet by mouth daily.   CALCIUM CARB-CHOLECALCIFEROL (CALCIUM 500 + D PO)    Take by mouth.   EZETIMIBE  (ZETIA ) 10 MG TABLET    Take 1 tablet (10 mg total) by mouth daily.   GABAPENTIN  (NEURONTIN ) 300 MG CAPSULE    TAKE 1 CAPSULE BY MOUTH THREE TIMES DAILY   HYDRALAZINE  (APRESOLINE ) 50 MG TABLET    Take 1 tablet (50 mg total) by mouth 2 (two) times daily.   LAMOTRIGINE  (LAMICTAL ) 150 MG TABLET    Take 2 tablets (300 mg total) by mouth 2 (two) times daily.   LATANOPROST  (XALATAN ) 0.005 % OPHTHALMIC SOLUTION    INSTILL 2 DROPS IN BOTH EYES AT BEDTIME   MECLIZINE  (ANTIVERT ) 25 MG TABLET    TAKE 1 TABLET(25 MG) BY MOUTH DAILY AS NEEDED FOR  DIZZINESS   MULTIPLE VITAMIN (MULTIVITAMIN) TABLET    Take 1 tablet by mouth daily.   SUZETRIGINE 50 MG TABS    Take 50 mg by mouth 2 (two) times daily.   TIMOLOL  (TIMOPTIC ) 0.5 % OPHTHALMIC SOLUTION    Place 1 drop into both eyes 2 (two) times daily.   TRAMADOL  (ULTRAM ) 50 MG TABLET    Take 1 tablet (50 mg total) by mouth every 8 (eight) hours as needed.  Modified Medications   Modified Medication Previous Medication   SERTRALINE  (ZOLOFT ) 25 MG TABLET sertraline  (ZOLOFT ) 50 MG tablet      Take 1 tablet (25 mg total) by mouth  daily.    TAKE 1 TABLET(50 MG) BY MOUTH DAILY  Discontinued Medications   No medications on file    Physical Exam:  Vitals:   07/14/23 1510  BP: 134/76  Pulse: 74  Temp: 97.9 F (36.6 C)  SpO2: 97%  Weight: 129 lb 6.4 oz (58.7 kg)  Height: 5' 3.5" (1.613 m)   Body mass index is 22.56 kg/m. Wt Readings from Last 3 Encounters:  07/14/23 129 lb 6.4 oz (58.7 kg)  04/30/23 134 lb (60.8 kg)  02/24/23 134 lb 8 oz (61 kg)    Physical Exam Constitutional:      General: She is not in acute distress.    Appearance: She is well-developed. She is not diaphoretic.  HENT:     Head: Normocephalic and atraumatic.     Mouth/Throat:     Pharynx: No oropharyngeal exudate.  Eyes:     Conjunctiva/sclera: Conjunctivae normal.     Pupils: Pupils are equal, round, and reactive to light.  Cardiovascular:     Rate and Rhythm: Normal rate and regular rhythm.     Heart sounds: Normal heart sounds.  Pulmonary:     Effort: Pulmonary effort is normal.     Breath sounds: Normal breath sounds.  Abdominal:     General: Bowel sounds are normal.     Palpations: Abdomen is soft.  Musculoskeletal:     Cervical back: Normal range of motion and neck supple.     Right lower leg: No edema.     Left lower leg: No edema.  Skin:    General: Skin is warm and dry.  Neurological:     Mental Status: She is alert.  Psychiatric:        Mood and Affect: Mood normal.     Labs  reviewed: Basic Metabolic Panel: Recent Labs    10/14/22 1134 04/30/23 1528  NA 135 140  K 4.6 3.9  CL 100 100  CO2 29 33*  GLUCOSE 75 90  BUN 13 19  CREATININE 0.77 0.84  CALCIUM 9.5 9.6  TSH  --  1.41   Liver Function Tests: Recent Labs    10/14/22 1134 04/30/23 1528  AST 19 15  ALT 14 12  BILITOT 0.5 0.3  PROT 6.6 6.7   No results for input(s): "LIPASE", "AMYLASE" in the last 8760 hours. No results for input(s): "AMMONIA" in the last 8760 hours. CBC: Recent Labs    04/30/23 1528  WBC 6.1  NEUTROABS 3,867  HGB 13.4  HCT 41.3  MCV 96.3  PLT 259   Lipid Panel: Recent Labs    10/14/22 1134  CHOL 216*  HDL 115  LDLCALC 87  TRIG 54  CHOLHDL 1.9   TSH: Recent Labs    04/30/23 1528  TSH 1.41   A1C: Lab Results  Component Value Date   HGBA1C 5.4 01/16/2022     Assessment/Plan Primary hypertension Assessment & Plan: Stopped losartan  and blood pressure remains well controlled.  Continue hydralazine  twice daily    Mild depression Assessment & Plan: Ongoing but stable, continues on zoloft  but wants to try decrease dose due to side effects on 50 mg daily.  Orders: -     Sertraline  HCl; Take 1 tablet (25 mg total) by mouth daily.  Dispense: 30 tablet; Refill: 1  Anxiety Assessment & Plan: Ongoing, discussed lifestyle modifications, continues on zoloft  but would like to cut back to 25 mg due to side effects  Orders: -     Sertraline  HCl; Take  1 tablet (25 mg total) by mouth daily.  Dispense: 30 tablet; Refill: 1  Dizziness Assessment & Plan: Improved at this time, continues on meclizine  daily to help with symptoms management    Trigeminal neuralgia Assessment & Plan: Continues on gabapentin  and lamictal  followed by neurology    Multiple sclerosis [facility]) Assessment & Plan: Ongoing, continues to follow up with neurology   Gait disorder Assessment & Plan: Ongoing, uses cane for support.    Gastroesophageal reflux disease without  esophagitis Assessment & Plan: Stable on dietary modifications. Uses OTC PRN.    Hypercholesterolemia Assessment & Plan: Continues on zetia  with dietary modifications.    Osteoporosis, unspecified osteoporosis type, unspecified pathological fracture presence Assessment & Plan: Recommended to take calcium 600 mg twice daily with Vitamin D 1000 units daily and weight bearing activity 30 mins/5 days a week       Return in about 3 months (around 10/13/2023) for routine follow up.  Manessa Buley K. Denney Fisherman [facility] [phone]

## 2023-07-14 NOTE — Assessment & Plan Note (Signed)
 Stopped losartan  and blood pressure remains well controlled.  Continue hydralazine  twice daily

## 2023-07-14 NOTE — Assessment & Plan Note (Signed)
 Improved at this time, continues on meclizine  daily to help with symptoms management

## 2023-07-14 NOTE — Assessment & Plan Note (Signed)
 Continues on zetia  with dietary modifications.

## 2023-07-14 NOTE — Assessment & Plan Note (Addendum)
 Stable on dietary modifications. Uses OTC PRN.

## 2023-07-14 NOTE — Assessment & Plan Note (Signed)
 Ongoing, continues to follow up with neurology

## 2023-07-14 NOTE — Assessment & Plan Note (Signed)
 Ongoing, uses cane for support.

## 2023-07-14 NOTE — Assessment & Plan Note (Signed)
 Ongoing but stable, continues on zoloft  but wants to try decrease dose due to side effects on 50 mg daily.

## 2023-07-14 NOTE — Assessment & Plan Note (Signed)
 Ongoing, discussed lifestyle modifications, continues on zoloft  but would like to cut back to 25 mg due to side effects

## 2023-07-14 NOTE — Patient Instructions (Addendum)
 To decrease zoloft  to 25 mg daily   Make sure you are eating 3 meals a day Protein supplement with smallest meal.   Vit d 1000 mg daily

## 2023-07-14 NOTE — Assessment & Plan Note (Addendum)
 Continues on gabapentin  and lamictal  followed by neurology

## 2023-07-17 ENCOUNTER — Telehealth: Payer: Self-pay | Admitting: Neurology

## 2023-07-17 NOTE — Telephone Encounter (Signed)
 Pt has called, was assured that RN will call once she has spoken with Dr Godwin Lat

## 2023-07-17 NOTE — Telephone Encounter (Signed)
 Pt said, had surgery two weeks ago and need some stronger medication for pain. Need some stronger medication than Gabapentin . Would like a call back.

## 2023-07-17 NOTE — Telephone Encounter (Signed)
 Called pt. Had gamma knife procedure 06/05/23 via neurosurgery but feels it did not help sx.   I reviewed their notes and see where they spoke with pt. See below. They also sent in methylprednisolone for her. Pt states she did not complete this correctly, got confused. Did not feel it helped anyway.Confirmed she is taking Lamotigine 150 mg TID; gabapentin  300 mg TID and Tramadol  50 mg prn. Does not take on a regular basis.   Wanting to know if she should increase tramadol  to a more scheduled basis or if she should increase gabapentin  dose. Aware I will send to MD and call back with his recommendation.

## 2023-07-18 NOTE — Telephone Encounter (Signed)
 LVM for pt relaying Dr. Thom Fleeting recommendation. Asked her to call back if she has any further questions.

## 2023-07-21 NOTE — Telephone Encounter (Signed)
 Pt called stating she was confused on how much Lamotrigine  she needed to take. Jeneane Miracle spoke with Dr Godwin Lat and he stated that pt can continue taking two Lamotrigine  150 mg twice a day, (600 mg) daily and to also continue taking Tramadol  as needed for pain. Pt was advised to continue taking medication and to give it a few days for the medication to take effect, also to give us  a call back if she has any additional questions. Pt verbalized understanding.

## 2023-07-23 DIAGNOSIS — R4701 Aphasia: Secondary | ICD-10-CM | POA: Diagnosis not present

## 2023-07-23 DIAGNOSIS — M19041 Primary osteoarthritis, right hand: Secondary | ICD-10-CM | POA: Diagnosis not present

## 2023-07-23 DIAGNOSIS — S60221A Contusion of right hand, initial encounter: Secondary | ICD-10-CM | POA: Diagnosis not present

## 2023-07-23 DIAGNOSIS — G319 Degenerative disease of nervous system, unspecified: Secondary | ICD-10-CM | POA: Diagnosis not present

## 2023-07-23 DIAGNOSIS — R2681 Unsteadiness on feet: Secondary | ICD-10-CM | POA: Diagnosis not present

## 2023-07-23 DIAGNOSIS — R404 Transient alteration of awareness: Secondary | ICD-10-CM | POA: Diagnosis not present

## 2023-07-23 DIAGNOSIS — G44309 Post-traumatic headache, unspecified, not intractable: Secondary | ICD-10-CM | POA: Diagnosis not present

## 2023-07-23 DIAGNOSIS — M542 Cervicalgia: Secondary | ICD-10-CM | POA: Diagnosis not present

## 2023-07-23 DIAGNOSIS — Z79899 Other long term (current) drug therapy: Secondary | ICD-10-CM | POA: Diagnosis not present

## 2023-07-23 DIAGNOSIS — R42 Dizziness and giddiness: Secondary | ICD-10-CM | POA: Diagnosis not present

## 2023-07-23 DIAGNOSIS — R262 Difficulty in walking, not elsewhere classified: Secondary | ICD-10-CM | POA: Diagnosis not present

## 2023-07-23 DIAGNOSIS — R930 Abnormal findings on diagnostic imaging of skull and head, not elsewhere classified: Secondary | ICD-10-CM | POA: Diagnosis not present

## 2023-07-23 DIAGNOSIS — Z823 Family history of stroke: Secondary | ICD-10-CM | POA: Diagnosis not present

## 2023-07-23 DIAGNOSIS — I6782 Cerebral ischemia: Secondary | ICD-10-CM | POA: Diagnosis not present

## 2023-07-23 DIAGNOSIS — M7989 Other specified soft tissue disorders: Secondary | ICD-10-CM | POA: Diagnosis not present

## 2023-07-23 DIAGNOSIS — Z043 Encounter for examination and observation following other accident: Secondary | ICD-10-CM | POA: Diagnosis not present

## 2023-07-23 DIAGNOSIS — E876 Hypokalemia: Secondary | ICD-10-CM | POA: Diagnosis not present

## 2023-07-24 DIAGNOSIS — S60221A Contusion of right hand, initial encounter: Secondary | ICD-10-CM | POA: Diagnosis not present

## 2023-07-24 DIAGNOSIS — E876 Hypokalemia: Secondary | ICD-10-CM | POA: Diagnosis not present

## 2023-07-24 DIAGNOSIS — R42 Dizziness and giddiness: Secondary | ICD-10-CM | POA: Diagnosis not present

## 2023-07-25 DIAGNOSIS — I440 Atrioventricular block, first degree: Secondary | ICD-10-CM | POA: Diagnosis not present

## 2023-07-28 ENCOUNTER — Telehealth: Payer: Self-pay | Admitting: Neurology

## 2023-07-28 NOTE — Telephone Encounter (Signed)
 Pt called to confirm appt .  Appt details confirmed.

## 2023-07-29 DIAGNOSIS — Z961 Presence of intraocular lens: Secondary | ICD-10-CM | POA: Diagnosis not present

## 2023-07-29 DIAGNOSIS — H5213 Myopia, bilateral: Secondary | ICD-10-CM | POA: Diagnosis not present

## 2023-07-29 DIAGNOSIS — D3131 Benign neoplasm of right choroid: Secondary | ICD-10-CM | POA: Diagnosis not present

## 2023-07-29 DIAGNOSIS — H52223 Regular astigmatism, bilateral: Secondary | ICD-10-CM | POA: Diagnosis not present

## 2023-07-29 DIAGNOSIS — H04123 Dry eye syndrome of bilateral lacrimal glands: Secondary | ICD-10-CM | POA: Diagnosis not present

## 2023-07-29 DIAGNOSIS — H401132 Primary open-angle glaucoma, bilateral, moderate stage: Secondary | ICD-10-CM | POA: Diagnosis not present

## 2023-07-29 DIAGNOSIS — H18453 Nodular corneal degeneration, bilateral: Secondary | ICD-10-CM | POA: Diagnosis not present

## 2023-07-29 DIAGNOSIS — H524 Presbyopia: Secondary | ICD-10-CM | POA: Diagnosis not present

## 2023-08-01 ENCOUNTER — Ambulatory Visit: Admitting: Nurse Practitioner

## 2023-08-01 ENCOUNTER — Encounter: Payer: Self-pay | Admitting: Nurse Practitioner

## 2023-08-01 VITALS — BP 116/78 | HR 73 | Temp 97.3°F | Ht 63.5 in | Wt 135.0 lb

## 2023-08-01 DIAGNOSIS — R269 Unspecified abnormalities of gait and mobility: Secondary | ICD-10-CM

## 2023-08-01 DIAGNOSIS — G35 Multiple sclerosis: Secondary | ICD-10-CM

## 2023-08-01 DIAGNOSIS — R42 Dizziness and giddiness: Secondary | ICD-10-CM | POA: Diagnosis not present

## 2023-08-01 DIAGNOSIS — E876 Hypokalemia: Secondary | ICD-10-CM

## 2023-08-01 DIAGNOSIS — G500 Trigeminal neuralgia: Secondary | ICD-10-CM

## 2023-08-01 NOTE — Progress Notes (Deleted)
 This encounter was created in error - please disregard.

## 2023-08-01 NOTE — Addendum Note (Signed)
 Addended by: Verma Gobble on: 08/01/2023 11:17 AM   Modules accepted: Level of Service

## 2023-08-01 NOTE — Progress Notes (Signed)
 Careteam: Patient Care Team: Verma Gobble, NP as PCP - General (Geriatric Medicine)  PLACE OF SERVICE:  [facility]  Advanced Directive information    Allergies  Allergen Reactions   Carbamazepine      Decreased sodium level   Imipramine      Night sweats, more intense dreams   Metronidazole Nausea Only   Oxcarbazepine     Decreased sodium level   Penicillins Swelling    Yeast infection   Red Blood Cells Other (See Comments)    Jehovah's witness, does not want blood products   Statins     Gets hyponatremia and severe muscle pain   Sulfa Antibiotics Swelling    yeast infection    Chief Complaint  Patient presents with   Follow-up    ED follow-up related to fall     Discussed the use of AI scribe software for clinical note transcription with the patient, who gave verbal consent to proceed.  History of Present Illness Crystal Huber is a 78 year old female with multiple sclerosis who presents for follow-up after a fall.  She visited the emergency department after sliding off her bed, attributing the fall to wearing old, slippery socks. She experienced garbled speech immediately after the fall, which resolved spontaneously and was not reported at the hospital. She crawled to the door to let paramedics in, as she was unable to get up on her own.  She has a history of chronic dizziness and lightheadedness, which she experiences frequently, but notes no new or worsening symptoms since the fall. No recent falls have occurred since the incident.  A CT scan of her head and cervical spine at the emergency department showed no acute changes, and an x-ray of her hand did not reveal any fractures. Her lab work indicated low potassium and magnesium levels.  She has a history of multiple sclerosis, specifically the progressive type, which she has managed for 22 years. She experiences slurred speech, particularly when tired or talking for extended periods, which she attributes to  her condition. She has been informed of chronic atrophy and ischemic changes in the brain, as well as degenerative changes in the neck.  Her current medications include lamotrigine  300 mg twice a day and gabapentin , which was increased due to pain. She was prescribed tramadol  but only took it once or twice due to its strength and has not continued to take. she underwent gamma knife treatment for trigeminal neuralgia, which initially did not relieve her pain, leading to adjustments in her medication regimen.    Review of Systems:  Review of Systems  Constitutional:  Negative for chills, fever and weight loss.  HENT:  Negative for tinnitus.   Respiratory:  Negative for cough, sputum production and shortness of breath.   Cardiovascular:  Negative for chest pain, palpitations and leg swelling.  Gastrointestinal:  Negative for abdominal pain, constipation, diarrhea and heartburn.  Genitourinary:  Negative for dysuria, frequency and urgency.  Musculoskeletal:  Negative for back pain, falls, joint pain and myalgias.  Skin: Negative.   Neurological:  Negative for dizziness and headaches.  Psychiatric/Behavioral:  Negative for depression and memory loss. The patient does not have insomnia.     Past Medical History:  Diagnosis Date   Arthritis    right pinkie   Cancer (HCC)    right BR  CA    Cataract    right eye   GERD (gastroesophageal reflux disease)    prn tums, mild not often  Glaucoma    Hyperlipidemia    Hypertension    Macrocytic anemia 09/12/2013   Multiple sclerosis (HCC)    Neuromuscular disorder (HCC)    trigeminal neuralgia   Refusal of blood product    Trigeminal neuralgia of left side of face    Past Surgical History:  Procedure Laterality Date   BREAST LUMPECTOMY Right    wears prosthesis   CATARACT EXTRACTION Left 05/2022   COLONOSCOPY  12/20/2010   ganglion cyst removal Bilateral    NERVE SURGERY  05/2022   PARTIAL HYSTERECTOMY     POLYPECTOMY     Social  History:   reports that she has quit smoking. Her smoking use included cigarettes. She has never used smokeless tobacco. She reports current alcohol  use. She reports that she does not use drugs.  Family History  Problem Relation Age of Onset   Congestive Heart Failure Mother    Heart disease Father    Heart attack Father    Stroke Sister    Breast cancer Sister    Heart disease Sister    Heart disease Brother    Heart disease Brother    Heart disease Brother    Colon cancer Neg Hx    Colon polyps Neg Hx    Esophageal cancer Neg Hx    Rectal cancer Neg Hx    Stomach cancer Neg Hx     Medications: Patient's Medications  New Prescriptions   No medications on file  Previous Medications   ASPIRIN  EC 81 MG TABLET    Take 1 tablet (81 mg total) by mouth daily. Swallow whole.   BACLOFEN  (LIORESAL ) 10 MG TABLET    Take 0.5-1 tablets (5-10 mg total) by mouth 3 (three) times daily.   BIOTIN W/ VITAMINS C & E (HAIR/SKIN/NAILS PO)    Take 1 tablet by mouth daily.   CALCIUM CARB-CHOLECALCIFEROL (CALCIUM 500 + D PO)    Take by mouth.   EZETIMIBE  (ZETIA ) 10 MG TABLET    Take 1 tablet (10 mg total) by mouth daily.   GABAPENTIN  (NEURONTIN ) 300 MG CAPSULE    TAKE 1 CAPSULE BY MOUTH THREE TIMES DAILY   HYDRALAZINE  (APRESOLINE ) 50 MG TABLET    Take 1 tablet (50 mg total) by mouth 2 (two) times daily.   LAMOTRIGINE  (LAMICTAL ) 150 MG TABLET    Take 2 tablets (300 mg total) by mouth 2 (two) times daily.   LATANOPROST  (XALATAN ) 0.005 % OPHTHALMIC SOLUTION    INSTILL 2 DROPS IN BOTH EYES AT BEDTIME   MECLIZINE  (ANTIVERT ) 25 MG TABLET    TAKE 1 TABLET(25 MG) BY MOUTH DAILY AS NEEDED FOR DIZZINESS   MULTIPLE VITAMIN (MULTIVITAMIN) TABLET    Take 1 tablet by mouth daily.   SERTRALINE  (ZOLOFT ) 25 MG TABLET    Take 1 tablet (25 mg total) by mouth daily.   SUZETRIGINE 50 MG TABS    Take 50 mg by mouth 2 (two) times daily.   TIMOLOL  (TIMOPTIC ) 0.5 % OPHTHALMIC SOLUTION    Place 1 drop into both eyes 2 (two)  times daily.   TRAMADOL  (ULTRAM ) 50 MG TABLET    Take 1 tablet (50 mg total) by mouth every 8 (eight) hours as needed.  Modified Medications   No medications on file  Discontinued Medications   No medications on file    Physical Exam:  Vitals:   08/01/23 1122  BP: 116/78  Pulse: 73  Temp: (!) 97.3 F (36.3 C)  SpO2: 96%   There is  no height or weight on file to calculate BMI. Wt Readings from Last 3 Encounters:  07/14/23 129 lb 6.4 oz (58.7 kg)  04/30/23 134 lb (60.8 kg)  02/24/23 134 lb 8 oz (61 kg)    Physical Exam Constitutional:      General: She is not in acute distress.    Appearance: She is well-developed. She is not diaphoretic.  HENT:     Head: Normocephalic and atraumatic.     Mouth/Throat:     Pharynx: No oropharyngeal exudate.  Eyes:     Conjunctiva/sclera: Conjunctivae normal.     Pupils: Pupils are equal, round, and reactive to light.  Cardiovascular:     Rate and Rhythm: Normal rate and regular rhythm.     Heart sounds: Normal heart sounds.  Pulmonary:     Effort: Pulmonary effort is normal.     Breath sounds: Normal breath sounds.  Abdominal:     General: Bowel sounds are normal.     Palpations: Abdomen is soft.  Musculoskeletal:     Cervical back: Normal range of motion and neck supple.     Right lower leg: No edema.     Left lower leg: No edema.  Skin:    General: Skin is warm and dry.  Neurological:     Mental Status: She is alert.     Motor: Weakness present.     Gait: Gait abnormal.  Psychiatric:        Mood and Affect: Mood normal.     Labs reviewed: Basic Metabolic Panel: Recent Labs    10/14/22 1134 04/30/23 1528  NA 135 140  K 4.6 3.9  CL 100 100  CO2 29 33*  GLUCOSE 75 90  BUN 13 19  CREATININE 0.77 0.84  CALCIUM 9.5 9.6  TSH  --  1.41   Liver Function Tests: Recent Labs    10/14/22 1134 04/30/23 1528  AST 19 15  ALT 14 12  BILITOT 0.5 0.3  PROT 6.6 6.7   No results for input(s): "LIPASE", "AMYLASE" in the  last 8760 hours. No results for input(s): "AMMONIA" in the last 8760 hours. CBC: Recent Labs    04/30/23 1528  WBC 6.1  NEUTROABS 3,867  HGB 13.4  HCT 41.3  MCV 96.3  PLT 259   Lipid Panel: Recent Labs    10/14/22 1134  CHOL 216*  HDL 115  LDLCALC 87  TRIG 54  CHOLHDL 1.9   TSH: Recent Labs    04/30/23 1528  TSH 1.41   A1C: Lab Results  Component Value Date   HGBA1C 5.4 01/16/2022     Assessment/Plan 1. Gait disorder S/p fall, strict fall precautions. Continues with caregivers that helps her   2. Dizziness Controlled, no worsening or changes in symptoms. Uses meclizine  PRN  3. 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 No new neurological deficits. - Continue lamotrigine  300 mg twice daily and gabapentin  as per neurologist's instructions. -follow-up with neurologist before scheduled June 23 appointment.  4. Hypomagnesemia Follow up  - Magnesium  5. Hypokalemia (Primary) Follow up  - Basic Metabolic Panel with eGFR  6. Trigeminal neuralgia -continues to follow up with neurology, pain managed with gabapentin  300 mg TID    Soniya Ashraf K. Denney Fisherman [facility] [phone]

## 2023-08-02 LAB — MAGNESIUM: Magnesium: 2.2 mg/dL (ref 1.5–2.5)

## 2023-08-02 LAB — BASIC METABOLIC PANEL WITHOUT GFR
BUN: 13 mg/dL (ref 7–25)
CO2: 29 mmol/L (ref 20–32)
Calcium: 9.5 mg/dL (ref 8.6–10.4)
Chloride: 102 mmol/L (ref 98–110)
Creat: 0.89 mg/dL (ref 0.60–1.00)
Glucose, Bld: 80 mg/dL (ref 65–139)
Potassium: 3.9 mmol/L (ref 3.5–5.3)
Sodium: 137 mmol/L (ref 135–146)

## 2023-08-03 DIAGNOSIS — E559 Vitamin D deficiency, unspecified: Secondary | ICD-10-CM | POA: Diagnosis not present

## 2023-08-03 DIAGNOSIS — I672 Cerebral atherosclerosis: Secondary | ICD-10-CM | POA: Diagnosis not present

## 2023-08-03 DIAGNOSIS — K219 Gastro-esophageal reflux disease without esophagitis: Secondary | ICD-10-CM | POA: Diagnosis not present

## 2023-08-03 DIAGNOSIS — S52501A Unspecified fracture of the lower end of right radius, initial encounter for closed fracture: Secondary | ICD-10-CM | POA: Diagnosis not present

## 2023-08-03 DIAGNOSIS — R42 Dizziness and giddiness: Secondary | ICD-10-CM | POA: Diagnosis not present

## 2023-08-03 DIAGNOSIS — R29898 Other symptoms and signs involving the musculoskeletal system: Secondary | ICD-10-CM | POA: Diagnosis not present

## 2023-08-03 DIAGNOSIS — T50904A Poisoning by unspecified drugs, medicaments and biological substances, undetermined, initial encounter: Secondary | ICD-10-CM | POA: Diagnosis not present

## 2023-08-03 DIAGNOSIS — R404 Transient alteration of awareness: Secondary | ICD-10-CM | POA: Diagnosis not present

## 2023-08-03 DIAGNOSIS — H401132 Primary open-angle glaucoma, bilateral, moderate stage: Secondary | ICD-10-CM | POA: Diagnosis not present

## 2023-08-03 DIAGNOSIS — Z79899 Other long term (current) drug therapy: Secondary | ICD-10-CM | POA: Diagnosis not present

## 2023-08-03 DIAGNOSIS — R6889 Other general symptoms and signs: Secondary | ICD-10-CM | POA: Diagnosis not present

## 2023-08-03 DIAGNOSIS — I700 Atherosclerosis of aorta: Secondary | ICD-10-CM | POA: Diagnosis not present

## 2023-08-03 DIAGNOSIS — M810 Age-related osteoporosis without current pathological fracture: Secondary | ICD-10-CM | POA: Diagnosis not present

## 2023-08-03 DIAGNOSIS — G35 Multiple sclerosis: Secondary | ICD-10-CM | POA: Diagnosis not present

## 2023-08-03 DIAGNOSIS — R21 Rash and other nonspecific skin eruption: Secondary | ICD-10-CM | POA: Diagnosis not present

## 2023-08-03 DIAGNOSIS — G500 Trigeminal neuralgia: Secondary | ICD-10-CM | POA: Diagnosis not present

## 2023-08-03 DIAGNOSIS — I499 Cardiac arrhythmia, unspecified: Secondary | ICD-10-CM | POA: Diagnosis not present

## 2023-08-03 DIAGNOSIS — S52571A Other intraarticular fracture of lower end of right radius, initial encounter for closed fracture: Secondary | ICD-10-CM | POA: Diagnosis not present

## 2023-08-03 DIAGNOSIS — M25531 Pain in right wrist: Secondary | ICD-10-CM | POA: Diagnosis not present

## 2023-08-03 DIAGNOSIS — H409 Unspecified glaucoma: Secondary | ICD-10-CM | POA: Diagnosis not present

## 2023-08-03 DIAGNOSIS — I509 Heart failure, unspecified: Secondary | ICD-10-CM | POA: Diagnosis not present

## 2023-08-03 DIAGNOSIS — I110 Hypertensive heart disease with heart failure: Secondary | ICD-10-CM | POA: Diagnosis not present

## 2023-08-04 ENCOUNTER — Ambulatory Visit: Payer: Self-pay | Admitting: Nurse Practitioner

## 2023-08-04 DIAGNOSIS — R29898 Other symptoms and signs involving the musculoskeletal system: Secondary | ICD-10-CM | POA: Diagnosis not present

## 2023-08-04 DIAGNOSIS — G319 Degenerative disease of nervous system, unspecified: Secondary | ICD-10-CM | POA: Diagnosis not present

## 2023-08-04 DIAGNOSIS — S52571A Other intraarticular fracture of lower end of right radius, initial encounter for closed fracture: Secondary | ICD-10-CM | POA: Diagnosis not present

## 2023-08-04 DIAGNOSIS — I444 Left anterior fascicular block: Secondary | ICD-10-CM | POA: Diagnosis not present

## 2023-08-04 DIAGNOSIS — I672 Cerebral atherosclerosis: Secondary | ICD-10-CM | POA: Diagnosis not present

## 2023-08-04 DIAGNOSIS — S52591A Other fractures of lower end of right radius, initial encounter for closed fracture: Secondary | ICD-10-CM | POA: Diagnosis not present

## 2023-08-04 DIAGNOSIS — S52501A Unspecified fracture of the lower end of right radius, initial encounter for closed fracture: Secondary | ICD-10-CM | POA: Diagnosis not present

## 2023-08-04 DIAGNOSIS — I639 Cerebral infarction, unspecified: Secondary | ICD-10-CM | POA: Diagnosis not present

## 2023-08-04 DIAGNOSIS — I700 Atherosclerosis of aorta: Secondary | ICD-10-CM | POA: Diagnosis not present

## 2023-08-04 DIAGNOSIS — R2990 Unspecified symptoms and signs involving the nervous system: Secondary | ICD-10-CM | POA: Diagnosis not present

## 2023-08-04 DIAGNOSIS — I6782 Cerebral ischemia: Secondary | ICD-10-CM | POA: Diagnosis not present

## 2023-08-06 DIAGNOSIS — R21 Rash and other nonspecific skin eruption: Secondary | ICD-10-CM | POA: Diagnosis not present

## 2023-08-06 DIAGNOSIS — M810 Age-related osteoporosis without current pathological fracture: Secondary | ICD-10-CM | POA: Diagnosis not present

## 2023-08-06 DIAGNOSIS — H409 Unspecified glaucoma: Secondary | ICD-10-CM | POA: Diagnosis not present

## 2023-08-06 DIAGNOSIS — R2681 Unsteadiness on feet: Secondary | ICD-10-CM | POA: Diagnosis not present

## 2023-08-06 DIAGNOSIS — M6281 Muscle weakness (generalized): Secondary | ICD-10-CM | POA: Diagnosis not present

## 2023-08-06 DIAGNOSIS — Z79899 Other long term (current) drug therapy: Secondary | ICD-10-CM | POA: Diagnosis not present

## 2023-08-06 DIAGNOSIS — R42 Dizziness and giddiness: Secondary | ICD-10-CM | POA: Diagnosis not present

## 2023-08-06 DIAGNOSIS — R296 Repeated falls: Secondary | ICD-10-CM | POA: Diagnosis not present

## 2023-08-06 DIAGNOSIS — R1313 Dysphagia, pharyngeal phase: Secondary | ICD-10-CM | POA: Diagnosis not present

## 2023-08-06 DIAGNOSIS — H43813 Vitreous degeneration, bilateral: Secondary | ICD-10-CM | POA: Diagnosis not present

## 2023-08-06 DIAGNOSIS — E559 Vitamin D deficiency, unspecified: Secondary | ICD-10-CM | POA: Diagnosis not present

## 2023-08-06 DIAGNOSIS — G35 Multiple sclerosis: Secondary | ICD-10-CM | POA: Diagnosis not present

## 2023-08-06 DIAGNOSIS — I10 Essential (primary) hypertension: Secondary | ICD-10-CM | POA: Diagnosis not present

## 2023-08-06 DIAGNOSIS — I509 Heart failure, unspecified: Secondary | ICD-10-CM | POA: Diagnosis not present

## 2023-08-06 DIAGNOSIS — R2689 Other abnormalities of gait and mobility: Secondary | ICD-10-CM | POA: Diagnosis not present

## 2023-08-06 DIAGNOSIS — S52591A Other fractures of lower end of right radius, initial encounter for closed fracture: Secondary | ICD-10-CM | POA: Diagnosis not present

## 2023-08-06 DIAGNOSIS — R2990 Unspecified symptoms and signs involving the nervous system: Secondary | ICD-10-CM | POA: Diagnosis not present

## 2023-08-06 DIAGNOSIS — H401112 Primary open-angle glaucoma, right eye, moderate stage: Secondary | ICD-10-CM | POA: Diagnosis not present

## 2023-08-06 DIAGNOSIS — S62101D Fracture of unspecified carpal bone, right wrist, subsequent encounter for fracture with routine healing: Secondary | ICD-10-CM | POA: Diagnosis not present

## 2023-08-06 DIAGNOSIS — M25531 Pain in right wrist: Secondary | ICD-10-CM | POA: Diagnosis not present

## 2023-08-06 DIAGNOSIS — I110 Hypertensive heart disease with heart failure: Secondary | ICD-10-CM | POA: Diagnosis not present

## 2023-08-06 DIAGNOSIS — H04129 Dry eye syndrome of unspecified lacrimal gland: Secondary | ICD-10-CM | POA: Diagnosis not present

## 2023-08-06 DIAGNOSIS — K219 Gastro-esophageal reflux disease without esophagitis: Secondary | ICD-10-CM | POA: Diagnosis not present

## 2023-08-06 DIAGNOSIS — G500 Trigeminal neuralgia: Secondary | ICD-10-CM | POA: Diagnosis not present

## 2023-08-06 DIAGNOSIS — Z9181 History of falling: Secondary | ICD-10-CM | POA: Diagnosis not present

## 2023-08-06 DIAGNOSIS — H401132 Primary open-angle glaucoma, bilateral, moderate stage: Secondary | ICD-10-CM | POA: Diagnosis not present

## 2023-08-06 DIAGNOSIS — S52571A Other intraarticular fracture of lower end of right radius, initial encounter for closed fracture: Secondary | ICD-10-CM | POA: Diagnosis not present

## 2023-08-06 DIAGNOSIS — H401122 Primary open-angle glaucoma, left eye, moderate stage: Secondary | ICD-10-CM | POA: Diagnosis not present

## 2023-08-06 DIAGNOSIS — S62109A Fracture of unspecified carpal bone, unspecified wrist, initial encounter for closed fracture: Secondary | ICD-10-CM | POA: Diagnosis not present

## 2023-08-12 DIAGNOSIS — M6281 Muscle weakness (generalized): Secondary | ICD-10-CM | POA: Diagnosis not present

## 2023-08-12 DIAGNOSIS — S62101D Fracture of unspecified carpal bone, right wrist, subsequent encounter for fracture with routine healing: Secondary | ICD-10-CM | POA: Diagnosis not present

## 2023-08-12 DIAGNOSIS — G35 Multiple sclerosis: Secondary | ICD-10-CM | POA: Diagnosis not present

## 2023-08-14 DIAGNOSIS — G35 Multiple sclerosis: Secondary | ICD-10-CM | POA: Diagnosis not present

## 2023-08-14 DIAGNOSIS — R2681 Unsteadiness on feet: Secondary | ICD-10-CM | POA: Diagnosis not present

## 2023-08-14 DIAGNOSIS — R2689 Other abnormalities of gait and mobility: Secondary | ICD-10-CM | POA: Diagnosis not present

## 2023-08-14 DIAGNOSIS — M6281 Muscle weakness (generalized): Secondary | ICD-10-CM | POA: Diagnosis not present

## 2023-08-14 DIAGNOSIS — I10 Essential (primary) hypertension: Secondary | ICD-10-CM | POA: Diagnosis not present

## 2023-08-14 DIAGNOSIS — S62101D Fracture of unspecified carpal bone, right wrist, subsequent encounter for fracture with routine healing: Secondary | ICD-10-CM | POA: Diagnosis not present

## 2023-08-14 DIAGNOSIS — R296 Repeated falls: Secondary | ICD-10-CM | POA: Diagnosis not present

## 2023-08-14 DIAGNOSIS — S62109A Fracture of unspecified carpal bone, unspecified wrist, initial encounter for closed fracture: Secondary | ICD-10-CM | POA: Diagnosis not present

## 2023-08-14 DIAGNOSIS — Z9181 History of falling: Secondary | ICD-10-CM | POA: Diagnosis not present

## 2023-08-15 DIAGNOSIS — G35 Multiple sclerosis: Secondary | ICD-10-CM | POA: Diagnosis not present

## 2023-08-15 DIAGNOSIS — M6281 Muscle weakness (generalized): Secondary | ICD-10-CM | POA: Diagnosis not present

## 2023-08-15 DIAGNOSIS — S62101D Fracture of unspecified carpal bone, right wrist, subsequent encounter for fracture with routine healing: Secondary | ICD-10-CM | POA: Diagnosis not present

## 2023-08-18 DIAGNOSIS — M6281 Muscle weakness (generalized): Secondary | ICD-10-CM | POA: Diagnosis not present

## 2023-08-18 DIAGNOSIS — S62101D Fracture of unspecified carpal bone, right wrist, subsequent encounter for fracture with routine healing: Secondary | ICD-10-CM | POA: Diagnosis not present

## 2023-08-18 DIAGNOSIS — R2681 Unsteadiness on feet: Secondary | ICD-10-CM | POA: Diagnosis not present

## 2023-08-18 DIAGNOSIS — Z9181 History of falling: Secondary | ICD-10-CM | POA: Diagnosis not present

## 2023-08-18 DIAGNOSIS — R2689 Other abnormalities of gait and mobility: Secondary | ICD-10-CM | POA: Diagnosis not present

## 2023-08-18 DIAGNOSIS — G35 Multiple sclerosis: Secondary | ICD-10-CM | POA: Diagnosis not present

## 2023-08-19 DIAGNOSIS — M25531 Pain in right wrist: Secondary | ICD-10-CM | POA: Diagnosis not present

## 2023-08-19 DIAGNOSIS — M6281 Muscle weakness (generalized): Secondary | ICD-10-CM | POA: Diagnosis not present

## 2023-08-19 DIAGNOSIS — G35 Multiple sclerosis: Secondary | ICD-10-CM | POA: Diagnosis not present

## 2023-08-19 DIAGNOSIS — S62101D Fracture of unspecified carpal bone, right wrist, subsequent encounter for fracture with routine healing: Secondary | ICD-10-CM | POA: Diagnosis not present

## 2023-08-21 DIAGNOSIS — Z9181 History of falling: Secondary | ICD-10-CM | POA: Diagnosis not present

## 2023-08-21 DIAGNOSIS — R2689 Other abnormalities of gait and mobility: Secondary | ICD-10-CM | POA: Diagnosis not present

## 2023-08-21 DIAGNOSIS — S62101D Fracture of unspecified carpal bone, right wrist, subsequent encounter for fracture with routine healing: Secondary | ICD-10-CM | POA: Diagnosis not present

## 2023-08-21 DIAGNOSIS — M6281 Muscle weakness (generalized): Secondary | ICD-10-CM | POA: Diagnosis not present

## 2023-08-21 DIAGNOSIS — G35 Multiple sclerosis: Secondary | ICD-10-CM | POA: Diagnosis not present

## 2023-08-21 DIAGNOSIS — R2681 Unsteadiness on feet: Secondary | ICD-10-CM | POA: Diagnosis not present

## 2023-08-23 DIAGNOSIS — H409 Unspecified glaucoma: Secondary | ICD-10-CM | POA: Diagnosis not present

## 2023-08-23 DIAGNOSIS — G500 Trigeminal neuralgia: Secondary | ICD-10-CM | POA: Diagnosis not present

## 2023-08-23 DIAGNOSIS — R296 Repeated falls: Secondary | ICD-10-CM | POA: Diagnosis not present

## 2023-08-23 DIAGNOSIS — Z9181 History of falling: Secondary | ICD-10-CM | POA: Diagnosis not present

## 2023-08-23 DIAGNOSIS — I110 Hypertensive heart disease with heart failure: Secondary | ICD-10-CM | POA: Diagnosis not present

## 2023-08-23 DIAGNOSIS — S52571D Other intraarticular fracture of lower end of right radius, subsequent encounter for closed fracture with routine healing: Secondary | ICD-10-CM | POA: Diagnosis not present

## 2023-08-23 DIAGNOSIS — G35 Multiple sclerosis: Secondary | ICD-10-CM | POA: Diagnosis not present

## 2023-08-23 DIAGNOSIS — H269 Unspecified cataract: Secondary | ICD-10-CM | POA: Diagnosis not present

## 2023-08-23 DIAGNOSIS — I509 Heart failure, unspecified: Secondary | ICD-10-CM | POA: Diagnosis not present

## 2023-08-26 ENCOUNTER — Other Ambulatory Visit: Payer: Self-pay | Admitting: Neurology

## 2023-08-26 DIAGNOSIS — S52571D Other intraarticular fracture of lower end of right radius, subsequent encounter for closed fracture with routine healing: Secondary | ICD-10-CM | POA: Diagnosis not present

## 2023-08-26 DIAGNOSIS — I110 Hypertensive heart disease with heart failure: Secondary | ICD-10-CM | POA: Diagnosis not present

## 2023-08-26 DIAGNOSIS — R296 Repeated falls: Secondary | ICD-10-CM | POA: Diagnosis not present

## 2023-08-26 DIAGNOSIS — Z9181 History of falling: Secondary | ICD-10-CM | POA: Diagnosis not present

## 2023-08-26 DIAGNOSIS — H409 Unspecified glaucoma: Secondary | ICD-10-CM | POA: Diagnosis not present

## 2023-08-26 DIAGNOSIS — I509 Heart failure, unspecified: Secondary | ICD-10-CM | POA: Diagnosis not present

## 2023-08-26 DIAGNOSIS — G500 Trigeminal neuralgia: Secondary | ICD-10-CM | POA: Diagnosis not present

## 2023-08-26 DIAGNOSIS — H269 Unspecified cataract: Secondary | ICD-10-CM | POA: Diagnosis not present

## 2023-08-26 DIAGNOSIS — G35 Multiple sclerosis: Secondary | ICD-10-CM | POA: Diagnosis not present

## 2023-08-26 MED ORDER — LACOSAMIDE 50 MG PO TABS: 50.0000 mg | ORAL_TABLET | Freq: Two times a day (BID) | ORAL | 2 refills | Status: DC

## 2023-08-26 NOTE — Telephone Encounter (Signed)
 Pt called wanting to speak to the RN regarding some Trigeminal Neuralgia pain she is experimenting. Please advise.

## 2023-08-26 NOTE — Telephone Encounter (Signed)
 I called patient. She reports having gamma knife surgery at [facility] in March. Since then, her TN has worsened. She had a fall which resulted in a broken wrist a few weeks ago and was sent to the hospital and her medication schedule was changed. She believes they cut back on her lamotrigine  but isn't sure. She was given tramadol  50mg  [id number] pain in the hospital but it doesn't seem to help her TN pain.  She called today because the TN pain last night was unbearable. She reports that she has started back on lamotrigine  300mg  BID and gabapentin  300mg  TID as of yesterday. She would rather not wait until her appointment later this month to discuss this with Dr. Godwin Lat since the pain was so bad last night.  I will discuss with Dr. Godwin Lat and call her back. Pt verbalized understanding and agreement.  I spoke with Dr. Godwin Lat.  He recommended Vimpat 50 mg twice daily, #60, with 2 refills.  I called patient.  She is agreeable to this plan.  She will pick up the Vimpat later at [facility].  She will let us  know if she experiences any side effects or new/worsening symptoms.

## 2023-08-27 ENCOUNTER — Encounter: Admitting: Sports Medicine

## 2023-08-27 DIAGNOSIS — G35 Multiple sclerosis: Secondary | ICD-10-CM | POA: Diagnosis not present

## 2023-08-27 DIAGNOSIS — Z9181 History of falling: Secondary | ICD-10-CM | POA: Diagnosis not present

## 2023-08-27 DIAGNOSIS — H269 Unspecified cataract: Secondary | ICD-10-CM | POA: Diagnosis not present

## 2023-08-27 DIAGNOSIS — G500 Trigeminal neuralgia: Secondary | ICD-10-CM | POA: Diagnosis not present

## 2023-08-27 DIAGNOSIS — R296 Repeated falls: Secondary | ICD-10-CM | POA: Diagnosis not present

## 2023-08-27 DIAGNOSIS — S52571D Other intraarticular fracture of lower end of right radius, subsequent encounter for closed fracture with routine healing: Secondary | ICD-10-CM | POA: Diagnosis not present

## 2023-08-27 DIAGNOSIS — I509 Heart failure, unspecified: Secondary | ICD-10-CM | POA: Diagnosis not present

## 2023-08-27 DIAGNOSIS — I110 Hypertensive heart disease with heart failure: Secondary | ICD-10-CM | POA: Diagnosis not present

## 2023-08-27 DIAGNOSIS — H409 Unspecified glaucoma: Secondary | ICD-10-CM | POA: Diagnosis not present

## 2023-08-28 ENCOUNTER — Telehealth: Payer: Self-pay

## 2023-08-28 NOTE — Telephone Encounter (Signed)
 Copied from CRM [id number]. Topic: Clinical - Home Health Verbal Orders >> Aug 28, 2023 11:22 AM Madelyne Schiff wrote: Caller/Agency: Loetta Ringer with [facility]  Callback Number: [phone] (secure line)  Service Requested: Physical Therapy Frequency: 1w1, 1 every other week for 8wks Any new concerns about the patient? No

## 2023-08-28 NOTE — Progress Notes (Signed)
 This encounter was created in error - please disregard.

## 2023-08-28 NOTE — Telephone Encounter (Signed)
 Call returned and detailed message left authorizing verbal order request per [facility] standing protocol

## 2023-09-01 ENCOUNTER — Emergency Department (HOSPITAL_COMMUNITY)

## 2023-09-01 ENCOUNTER — Emergency Department (HOSPITAL_COMMUNITY)
Admission: EM | Admit: 2023-09-01 | Discharge: 2023-09-01 | Disposition: A | Discharge status: Home / Self Care | Attending: Emergency Medicine | Admitting: Emergency Medicine

## 2023-09-01 ENCOUNTER — Other Ambulatory Visit: Payer: Self-pay

## 2023-09-01 DIAGNOSIS — G319 Degenerative disease of nervous system, unspecified: Secondary | ICD-10-CM | POA: Diagnosis not present

## 2023-09-01 DIAGNOSIS — I10 Essential (primary) hypertension: Secondary | ICD-10-CM | POA: Insufficient documentation

## 2023-09-01 DIAGNOSIS — Z79899 Other long term (current) drug therapy: Secondary | ICD-10-CM | POA: Diagnosis not present

## 2023-09-01 DIAGNOSIS — Z7982 Long term (current) use of aspirin: Secondary | ICD-10-CM | POA: Diagnosis not present

## 2023-09-01 DIAGNOSIS — R404 Transient alteration of awareness: Secondary | ICD-10-CM | POA: Diagnosis not present

## 2023-09-01 DIAGNOSIS — Z87891 Personal history of nicotine dependence: Secondary | ICD-10-CM | POA: Diagnosis not present

## 2023-09-01 DIAGNOSIS — G35 Multiple sclerosis: Secondary | ICD-10-CM | POA: Insufficient documentation

## 2023-09-01 DIAGNOSIS — R569 Unspecified convulsions: Secondary | ICD-10-CM

## 2023-09-01 DIAGNOSIS — I63521 Cerebral infarction due to unspecified occlusion or stenosis of right anterior cerebral artery: Secondary | ICD-10-CM | POA: Diagnosis not present

## 2023-09-01 DIAGNOSIS — R29818 Other symptoms and signs involving the nervous system: Secondary | ICD-10-CM | POA: Diagnosis not present

## 2023-09-01 DIAGNOSIS — R6889 Other general symptoms and signs: Secondary | ICD-10-CM | POA: Diagnosis not present

## 2023-09-01 DIAGNOSIS — F439 Reaction to severe stress, unspecified: Secondary | ICD-10-CM

## 2023-09-01 DIAGNOSIS — R4781 Slurred speech: Secondary | ICD-10-CM | POA: Insufficient documentation

## 2023-09-01 DIAGNOSIS — Z853 Personal history of malignant neoplasm of breast: Secondary | ICD-10-CM | POA: Insufficient documentation

## 2023-09-01 DIAGNOSIS — I6523 Occlusion and stenosis of bilateral carotid arteries: Secondary | ICD-10-CM | POA: Diagnosis not present

## 2023-09-01 DIAGNOSIS — E876 Hypokalemia: Secondary | ICD-10-CM | POA: Insufficient documentation

## 2023-09-01 DIAGNOSIS — R4701 Aphasia: Secondary | ICD-10-CM | POA: Insufficient documentation

## 2023-09-01 DIAGNOSIS — I700 Atherosclerosis of aorta: Secondary | ICD-10-CM | POA: Diagnosis not present

## 2023-09-01 DIAGNOSIS — R42 Dizziness and giddiness: Secondary | ICD-10-CM | POA: Diagnosis not present

## 2023-09-01 DIAGNOSIS — Z743 Need for continuous supervision: Secondary | ICD-10-CM | POA: Diagnosis not present

## 2023-09-01 DIAGNOSIS — Z8673 Personal history of transient ischemic attack (TIA), and cerebral infarction without residual deficits: Secondary | ICD-10-CM | POA: Insufficient documentation

## 2023-09-01 DIAGNOSIS — I6621 Occlusion and stenosis of right posterior cerebral artery: Secondary | ICD-10-CM | POA: Diagnosis not present

## 2023-09-01 DIAGNOSIS — S0990XA Unspecified injury of head, initial encounter: Secondary | ICD-10-CM | POA: Diagnosis not present

## 2023-09-01 LAB — DIFFERENTIAL
Abs Immature Granulocytes: 0.01 10*3/uL (ref 0.00–0.07)
Basophils Absolute: 0 10*3/uL (ref 0.0–0.1)
Basophils Relative: 1 %
Eosinophils Absolute: 0.1 10*3/uL (ref 0.0–0.5)
Eosinophils Relative: 1 %
Immature Granulocytes: 0 %
Lymphocytes Relative: 25 %
Lymphs Abs: 1.4 10*3/uL (ref 0.7–4.0)
Monocytes Absolute: 0.6 10*3/uL (ref 0.1–1.0)
Monocytes Relative: 11 %
Neutro Abs: 3.5 10*3/uL (ref 1.7–7.7)
Neutrophils Relative %: 62 %

## 2023-09-01 LAB — I-STAT CHEM 8, ED
BUN: 9 mg/dL (ref 8–23)
Calcium, Ion: 1.09 mmol/L — ABNORMAL LOW (ref 1.15–1.40)
Chloride: 102 mmol/L (ref 98–111)
Creatinine, Ser: 0.8 mg/dL (ref 0.44–1.00)
Glucose, Bld: 109 mg/dL — ABNORMAL HIGH (ref 70–99)
HCT: 40 % (ref 36.0–46.0)
Hemoglobin: 13.6 g/dL (ref 12.0–15.0)
Potassium: 3 mmol/L — ABNORMAL LOW (ref 3.5–5.1)
Sodium: 137 mmol/L (ref 135–145)
TCO2: 23 mmol/L (ref 22–32)

## 2023-09-01 LAB — COMPREHENSIVE METABOLIC PANEL WITH GFR
ALT: 13 U/L (ref 0–44)
AST: 22 U/L (ref 15–41)
Albumin: 3.6 g/dL (ref 3.5–5.0)
Alkaline Phosphatase: 93 U/L (ref 38–126)
Anion gap: 11 (ref 5–15)
BUN: 9 mg/dL (ref 8–23)
CO2: 25 mmol/L (ref 22–32)
Calcium: 9.1 mg/dL (ref 8.9–10.3)
Chloride: 103 mmol/L (ref 98–111)
Creatinine, Ser: 0.78 mg/dL (ref 0.44–1.00)
GFR, Estimated: >60 mL/min (ref >60)
Glucose, Bld: 109 mg/dL — ABNORMAL HIGH (ref 70–99)
Potassium: 3 mmol/L — ABNORMAL LOW (ref 3.5–5.1)
Sodium: 139 mmol/L (ref 135–145)
Total Bilirubin: 0.8 mg/dL (ref 0.0–1.2)
Total Protein: 6.6 g/dL (ref 6.5–8.1)

## 2023-09-01 LAB — CBC
HCT: 40.3 % (ref 36.0–46.0)
Hemoglobin: 13.2 g/dL (ref 12.0–15.0)
MCH: 30.9 pg (ref 26.0–34.0)
MCHC: 32.8 g/dL (ref 30.0–36.0)
MCV: 94.4 fL (ref 80.0–100.0)
Platelets: 241 10*3/uL (ref 150–400)
RBC: 4.27 MIL/uL (ref 3.87–5.11)
RDW: 13.9 % (ref 11.5–15.5)
WBC: 5.7 10*3/uL (ref 4.0–10.5)
nRBC: 0 % (ref 0.0–0.2)

## 2023-09-01 LAB — PROTIME-INR
INR: 0.9 (ref 0.8–1.2)
Prothrombin Time: 12.3 s (ref 11.4–15.2)

## 2023-09-01 LAB — CBG MONITORING, ED: Glucose-Capillary: 97 mg/dL (ref 70–99)

## 2023-09-01 LAB — ETHANOL: Alcohol, Ethyl (B): <15 mg/dL (ref <15)

## 2023-09-01 LAB — APTT: aPTT: 32 s (ref 24–36)

## 2023-09-01 MED ORDER — LORAZEPAM 2 MG/ML IJ SOLN
INTRAMUSCULAR | Status: AC
  Filled 2023-09-01: qty 1

## 2023-09-01 MED ORDER — IOHEXOL 350 MG/ML SOLN: 75.0000 mL | Freq: Once | INTRAVENOUS | Status: AC | PRN

## 2023-09-01 MED ORDER — POTASSIUM CHLORIDE ER 10 MEQ PO TBCR: 10.0000 meq | EXTENDED_RELEASE_TABLET | Freq: Every day | ORAL | 0 refills | Status: AC

## 2023-09-01 MED ORDER — SODIUM CHLORIDE 0.9% FLUSH: 3.0000 mL | Freq: Once | INTRAVENOUS | Status: AC

## 2023-09-01 MED ORDER — LORAZEPAM 2 MG/ML IJ SOLN
1.0000 mg | INTRAMUSCULAR | Status: DC | PRN
  Administered 2023-09-01: 1 mg via INTRAVENOUS

## 2023-09-01 MED ORDER — POTASSIUM CHLORIDE CRYS ER 20 MEQ PO TBCR
40.0000 meq | EXTENDED_RELEASE_TABLET | Freq: Once | ORAL | Status: AC
  Filled 2023-09-01: qty 2

## 2023-09-01 NOTE — ED Notes (Signed)
 Laying: HR

## 2023-09-01 NOTE — ED Notes (Signed)
 Patient accompanied to CT by stroke team and this RN

## 2023-09-01 NOTE — Code Documentation (Signed)
 Stroke Response Nurse Documentation Code Documentation  Crystal Huber is a 78 y.o. female arriving to [facility]  via [facility] as Code Stroke Activation. Patient LKW 1600. Now with complaints of aphasia.   Stroke team met pt at bridge on arrival, labs drawn, cleared for CT by EDP Annabell Key. NIH 2, see flowsheet for details. CT/CTA completed. Exam not consistent with LVO.Patient then taken to MRI for TNK decision, 1mg  IV ativan  given for MRI.  MRI negative. Stroke cancelled. Bedside handoff with ED RN Alisa App.    Ronney Cola K  Rapid Response RN

## 2023-09-01 NOTE — ED Provider Notes (Signed)
 [place] EMERGENCY DEPARTMENT AT [facility] Provider Note   CSN: [id number] Arrival date & time: 09/01/23  1725  An emergency department physician performed an initial assessment on this suspected stroke patient at 1726.  Patient presents with: Code Stroke   Crystal Huber is a 78 y.o. female.   HPI The patient is a 78 year old female on low-dose aspirin , she has a history of hypertension, also has a history of hypokalemia, multiple sclerosis, trigeminal neuralgia, low magnesium and an gait disorder.  She reports to the hospital today by ambulance transport that she has been having difficulty walking, slurred speech and possible right-sided facial droop with dizziness that started acute in onset around 4:00 PM.  The patient states that she is feeling better at this time and the paramedics noted that she is no longer having any significant facial droop though her speech is still little bit difficult, they noted that she was unable to name certain objects which is out of character for her.  She does not have a headache fevers chills and has no chest pain or shortness of breath.  Code stroke was activated prehospital secondary to the acute onset of dizziness facial droop and difficulty speaking    Prior to Admission medications   Medication Sig Start Date End Date Taking? Authorizing Provider  potassium chloride  (KLOR-CON ) 10 MEQ tablet Take 1 tablet (10 mEq total) by mouth daily. 09/01/23  Yes Early Glisson, MD  aspirin  EC 81 MG tablet Take 1 tablet (81 mg total) by mouth daily. Swallow whole. 01/22/22   Elgergawy, Ardia Kraft, MD  baclofen  (LIORESAL ) 10 MG tablet Take 0.5-1 tablets (5-10 mg total) by mouth 3 (three) times daily. Patient not taking: Reported on 08/01/2023 06/16/23   Jorie Newness, MD  Biotin w/ Vitamins C & E (HAIR/SKIN/NAILS PO) Take 1 tablet by mouth daily.    [provider]  Calcium Carb-Cholecalciferol (CALCIUM 500 + D PO) Take by mouth.    [provider]  ezetimibe  (ZETIA ) 10 MG tablet Take 1 tablet (10 mg total) by mouth daily. 12/23/22   Verma Gobble, NP  gabapentin  (NEURONTIN ) 300 MG capsule TAKE 1 CAPSULE BY MOUTH THREE TIMES DAILY 04/17/23   Sater, Sherida Dimmer, MD  hydrALAZINE  (APRESOLINE ) 50 MG tablet Take 1 tablet (50 mg total) by mouth 2 (two) times daily. 11/26/22   Verma Gobble, NP  lacosamide  (VIMPAT ) 50 MG TABS tablet Take 1 tablet (50 mg total) by mouth 2 (two) times daily. 08/26/23   Sater, Sherida Dimmer, MD  lamoTRIgine  (LAMICTAL ) 150 MG tablet Take 2 tablets (300 mg total) by mouth 2 (two) times daily. 05/21/23   Sater, Sherida Dimmer, MD  latanoprost  (XALATAN ) 0.005 % ophthalmic solution INSTILL 2 DROPS IN BOTH EYES AT BEDTIME 06/13/23   Eubanks, Jessica K, NP  meclizine  (ANTIVERT ) 25 MG tablet TAKE 1 TABLET(25 MG) BY MOUTH DAILY AS NEEDED FOR DIZZINESS 05/12/23   Eubanks, Jessica K, NP  Multiple Vitamin (MULTIVITAMIN) tablet Take 1 tablet by mouth daily.    [provider]  sertraline  (ZOLOFT ) 25 MG tablet Take 1 tablet (25 mg total) by mouth daily. 07/14/23   Eubanks, Jessica K, NP  Suzetrigine 50 MG TABS Take 50 mg by mouth 2 (two) times daily. 06/05/23   [provider]  timolol  (TIMOPTIC ) 0.5 % ophthalmic solution Place 1 drop into both eyes 2 (two) times daily. 03/21/20   [provider]    Allergies: Carbamazepine , Imipramine , Metronidazole, Oxcarbazepine, Penicillins, Red blood  cells, Statins, and Sulfa antibiotics    Review of Systems  All other systems reviewed and are negative.   Updated Vital Signs BP (!) 150/89   Pulse 66   Temp 98.3 F (36.8 C) (Oral)   Resp 13   Ht 1.613 m (5' 3.5)   Wt 56.2 kg   SpO2 100%   BMI 21.60 kg/m   Physical Exam Vitals and nursing note reviewed.  Constitutional:      General: She is not in acute distress.    Appearance: She is well-developed.  HENT:     Head: Normocephalic and atraumatic.     Mouth/Throat:     Pharynx: No oropharyngeal  exudate.   Eyes:     General: No scleral icterus.       Right eye: No discharge.        Left eye: No discharge.     Conjunctiva/sclera: Conjunctivae normal.     Pupils: Pupils are equal, round, and reactive to light.   Neck:     Thyroid : No thyromegaly.     Vascular: No JVD.   Cardiovascular:     Rate and Rhythm: Normal rate and regular rhythm.     Heart sounds: Normal heart sounds. No murmur heard.    No friction rub. No gallop.  Pulmonary:     Effort: Pulmonary effort is normal. No respiratory distress.     Breath sounds: Normal breath sounds. No wheezing or rales.  Abdominal:     General: Bowel sounds are normal. There is no distension.     Palpations: Abdomen is soft. There is no mass.     Tenderness: There is no abdominal tenderness.   Musculoskeletal:        General: No tenderness. Normal range of motion.     Cervical back: Normal range of motion and neck supple.     Right lower leg: No edema.     Left lower leg: No edema.  Lymphadenopathy:     Cervical: No cervical adenopathy.   Skin:    General: Skin is warm and dry.     Findings: No erythema or rash.   Neurological:     Mental Status: She is alert.     Coordination: Coordination normal.     Comments: Slight difficulty with words, when I asked her to count to 10 she misses 5 and 6, Able to move all 4 extremities with normal strength, she has normal peripheral visual fields and cranial nerves III through XII appear normal, I do not detect any facial droop.  Equal grips and equal strength in the legs.  Psychiatric:        Behavior: Behavior normal.     (all labs ordered are listed, but only abnormal results are displayed) Labs Reviewed  COMPREHENSIVE METABOLIC PANEL WITH GFR - Abnormal; Notable for the following components:      Result Value   Potassium 3.0 (*)    Glucose, Bld 109 (*)    All other components within normal limits  I-STAT CHEM 8, ED - Abnormal; Notable for the following components:    Potassium 3.0 (*)    Glucose, Bld 109 (*)    Calcium, Ion 1.09 (*)    All other components within normal limits  PROTIME-INR  APTT  CBC  DIFFERENTIAL  ETHANOL  MAGNESIUM  CBG MONITORING, ED    EKG: EKG Interpretation Date/Time:  Monday September 01 2023 18:22:29 EDT Ventricular Rate:  70 PR Interval:  202 QRS Duration:  99 QT Interval:  464 QTC Calculation: 501 R Axis:   102  Text Interpretation: Sinus rhythm Right axis deviation Borderline low voltage, extremity leads Prolonged QT interval Confirmed by Early Glisson ([id number]) on 09/01/2023 9:14:55 PM  Radiology: MR BRAIN WO CONTRAST Result Date: 09/01/2023 CLINICAL DATA:  Neuro deficit, acute, stroke suspected EXAM: MRI HEAD WITHOUT CONTRAST TECHNIQUE: Multiplanar, multiecho pulse sequences of the brain and surrounding structures were obtained without intravenous contrast. COMPARISON:  Same day CT head. FINDINGS: Brain: No acute infarction, hemorrhage, hydrocephalus, extra-axial collection or mass lesion. 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 Patchy T2/FLAIR hyperintensities the white matter, nonspecific but compatible with chronic microvascular disease. Cerebral atrophy. Vascular: Major arterial flow voids are maintained. Skull and upper cervical spine: Normal marrow signal. Sinuses/Orbits: Negative. Other: No mastoid effusions. IMPRESSION: 1. No evidence of acute intracranial abnormality. 2. Remote left cerebellar and right frontal infarcts. Electronically Signed   By: Stevenson Elbe M.D.   On: 09/01/2023 18:47   CT ANGIO HEAD NECK W WO CM (CODE STROKE) Result Date: 09/01/2023 EXAM: CTA HEAD AND NECK WITH AND WITHOUT 09/01/2023 05:41:17 PM TECHNIQUE: CTA of the head and neck was performed with and without the administration of intravenous contrast. Multiplanar 2D and/or 3D reformatted images are provided for review. Automated exposure control, iterative reconstruction, and/or weight based adjustment of the mA/kV  was utilized to reduce the radiation dose to as low as reasonably achievable. Stenosis of the internal carotid arteries measured using NASCET criteria. COMPARISON: Head CT 09/01/2023 CLINICAL HISTORY: FINDINGS: CTA NECK: AORTIC ARCH AND ARCH VESSELS: Atherosclerotic calcifications of the aortic arch. No dissection or arterial injury. No significant stenosis of the brachiocephalic or subclavian arteries. CERVICAL CAROTID ARTERIES: Mild calcified plaque along the right carotid bulb without hemodynamically significant stenosis. Mixed plaque along the proximal left cervical ICA results in 60% stenosis by NASCET criteria. No dissection or arterial injury. CERVICAL VERTEBRAL ARTERIES: No dissection, arterial injury, or significant stenosis. LUNGS AND MEDIASTINUM: Unremarkable. SOFT TISSUES: No acute abnormality. BONES: No acute abnormality. CTA HEAD: ANTERIOR CIRCULATION: Atherosclerotic calcifications of the ICA siphons without significant stenosis or aneurysm. No significant stenosis of the anterior cerebral arteries. No significant stenosis of the middle cerebral arteries. No aneurysm. POSTERIOR CIRCULATION: Severe stenosis of the right PCA p1 segment. Multifocal mild stenosis of the left PCA p2 segments. No significant stenosis of the basilar artery. No significant stenosis of the vertebral arteries. No aneurysm. OTHER: No dural venous sinus thrombosis on this non-dedicated study. IMPRESSION: 1. No acute large vessel occlusion. 2. 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 3. Mixed plaque along the proximal left cervical ICA resulting in 60% stenosis. Electronically signed by: Audra Blend MD 09/01/2023 06:07 PM EDT RP Workstation: [id number]   CT HEAD CODE STROKE WO CONTRAST Result Date: 09/01/2023 CLINICAL DATA:  Code stroke.  Neuro deficit, acute, stroke suspected EXAM: CT HEAD WITHOUT CONTRAST TECHNIQUE: Contiguous axial images were obtained from the  base of the skull through the vertex without intravenous contrast. RADIATION DOSE REDUCTION: This exam was performed according to the departmental dose-optimization program which includes automated exposure control, adjustment of the mA and/or kV according to patient size and/or use of iterative reconstruction technique. COMPARISON:  MRI head May 19, 25. CT head Aug 04, 2023. FINDINGS: Brain: Similar remote infarct in the right frontal lobe. Similar patchy in white matter hypodensities, compatible with chronic microvascular ischemic disease. No evidence of acute large vascular territory infarct, acute hemorrhage, mass lesion,  midline shift or hydrocephalus. Cerebral atrophy. Vascular: No hyperdense vessel.  Calcific atherosclerosis. Skull: No acute fracture. Sinuses/Orbits: Clear sinuses.  No acute orbital findings. ASPECTS [facility] Stroke Program Early CT Score) Total score (0-10 with 10 being normal): 10. IMPRESSION: 1. No evidence of acute intracranial abnormality.  ASPECTS is 10. 2. Remote right frontal infarct. Code stroke imaging results were communicated on 09/01/2023 at 5:44 pm to provider Dr. Murvin Arthurs via secure text paging. Electronically Signed   By: Stevenson Elbe M.D.   On: 09/01/2023 17:45     Procedures   Medications Ordered in the ED  LORazepam  (ATIVAN ) injection 1 mg (1 mg Intravenous Given 09/01/23 1751)  potassium chloride  SA (KLOR-CON  M) CR tablet 40 mEq (has no administration in time range)  sodium chloride  flush (NS) 0.9 % injection 3 mL (3 mLs Intravenous Given 09/01/23 1822)  iohexol  (OMNIPAQUE ) 350 MG/ML injection 75 mL (75 mLs Intravenous Contrast Given 09/01/23 1741)                                    Medical Decision Making Amount and/or Complexity of Data Reviewed Labs: ordered. Radiology: ordered.  Risk Prescription drug management.    This patient presents to the ED for concern of acute difficulty with speaking, possible facial droop, this involves an  extensive number of treatment options, and is a complaint that carries with it a high risk of complications and morbidity.  The differential diagnosis includes possible stroke, MS, metabolic abnormality, hemorrhage   Co morbidities / Chronic conditions that complicate the patient evaluation  Multiple sclerosis, hypertension   Additional history obtained:  Additional history obtained from EMR External records from outside source obtained and reviewed including medical record including office visits   Lab Tests:  I Ordered, and personally interpreted labs.  The pertinent results include: Hypokalemia, 3.0, CBC without findings of concern   Imaging Studies ordered:  I ordered imaging studies including MRI of the brain I independently visualized and interpreted imaging which showed remote cerebellar and right frontal infarcts, this partially explains the patient's difficulty with balance I agree with the radiologist interpretation   Cardiac Monitoring: / EKG:  The patient was maintained on a cardiac monitor.  I personally viewed and interpreted the cardiac monitored which showed an underlying rhythm of: Normal sinus rhythm   Problem List / ED Course / Critical interventions / Medication management  This patient has been seen by the neurologist, they have recommended that the patient can be followed up in the outpatient setting and does not need to be admitted to the hospital, there is no evidence of stroke, there was hypokalemia but this was treated with oral potassium and outpatient potassium I ordered medication including oral potassium Reevaluation of the patient after these medicines showed that the patient no acute findings in fact the patient was able to ambulate at baseline I have reviewed the patients home medicines and have made adjustments as needed   Consultations Obtained:  I requested consultation with the neurology,  and discussed lab and imaging findings as well as  pertinent plan - they recommend: Outpatient follow-up given the patient's clinical picture of stability   Social Determinants of Health:  Elderly, MS   Test / Admission - Considered:  Consider admission but the patient is stable for discharge  I have discussed with the patient at the bedside the results, and the meaning of these results.  They have had  opportunity to ask questions,  expressed their understanding to the need for follow-up with primary care physician      Final diagnoses:  Slurred speech  Dizziness  Hypokalemia    ED Discharge Orders          Ordered    potassium chloride  (KLOR-CON ) 10 MEQ tablet  Daily        09/01/23 2245               Early Glisson, MD 09/01/23 2248

## 2023-09-01 NOTE — ED Notes (Signed)
 Patient accompanied to MRI by this RN and stroke team.

## 2023-09-01 NOTE — Discharge Instructions (Signed)
 Thankfully your testing did not show any signs of a stroke, your potassium was a little bit low so you will need to take potassium once a day for 10 days and make sure your family doctor recheck your potassium at the end of the medication.  Thank you for allowing us  to treat you in the emergency department today.  After reviewing your examination and potential testing that was done it appears that you are safe to go home.  I would like for you to follow-up with your doctor within the next several days, have them obtain your records and follow-up with them to review all potential tests and results from your visit.  If you should develop severe or worsening symptoms return to the emergency department immediately

## 2023-09-01 NOTE — ED Notes (Signed)
 Per MD Murvin Arthurs cancel code stroke

## 2023-09-01 NOTE — ED Notes (Signed)
 CCMD called, pt on monitor

## 2023-09-01 NOTE — Progress Notes (Addendum)
 Additional history obtained from patient/caregiver, they consented for use of abridge AI to facilitate note creation  Of note history is slightly tangential from both patient/caregiver, but from the best of my ability to determine their concerns are documented as follows  She experienced an episode of slurred speech and dizziness while having lunch. She took her regular medications, lamotrigine  and gabapentin , at 2 PM, without any extra doses; she did appear to be having a paroxysm of her typical trigeminal neuralgia pain prior to this. After taking her medications, she consumed a little milk and then began to exhibit slurred speech. This episode was atypical, prompting her caregiver to call EMS.  She has a history of dizziness for years, which has worsened over the past year. She describes that the last several days she has had episodes of dizziness that prevent her from standing up, requiring her to return to bed. She attempted to use her walker but fell yesterday due to dizziness and a wobbly walker.  Today caregiver tightened the loose arm supports walker, but it may have contributed to her fall. No shaking or twitching occurred during these episodes, and she has never had a seizure.  She takes hydralazine  twice a day for blood pressure management. Her caregiver assists with medication administration. She denies missing any doses of her blood pressure medication, however is planning to change how she monitors her medication adherence by using a different sort of pill minder.  She was notably quite hypertensive on initial arrival to the ED  She has two assisted health care aides and a CNA who help manage her care. She has been experiencing increased dizziness, particularly when standing. No seizures or similar episodes in the past.  Etiology of today's episode may have been hypertensive urgency/emergency, but with her episodes of dizziness with standing I am also concerned about the possibility of  orthostatic hypotension  Recommend checking orthostatic vital signs and making sure that she can ambulate at her baseline.  If she is ambulating at her baseline then she can follow-up outpatient If she is orthostatic, I would try TED stockings and an abdominal binder to see if that helps  Routine EEG was recommended by my colleague but the episode is not highly concerning for seizure based on description, so if the patient is eager for discharge and seems appropriate for discharge medically (including being able to ambulate at her baseline) this could be pursued outpatient Hypokalemia may be contributing to her symptoms and I appreciate management of this per ED/primary team/PCP   Baldwin Levee MD-PhD [facility] [phone] Available 7 PM to 7 AM, outside of these hours please call Neurologist on call as listed on Amion.   No charge same day note

## 2023-09-01 NOTE — ED Triage Notes (Addendum)
 Patient arrives via [facility] activated as a code stroke with LKW 1600. Per CNA from home health, patient had AMS, speech issues, ataxic gait, and right sided facial droop. Patient endorsed dizziness x1 week, hx of vertigo, attempted to take meclizine  but did not. Patient also having difficulty word finding. Patient endorses fall this morning. Patient endorses no pain, no dizziness, not anticoagulated. Hx of MS and trigeminal neuralgia. Initially 220 systolic, last EMS BP 178/76. CBG 97  18 LAC

## 2023-09-01 NOTE — Consult Note (Signed)
 NEUROLOGY CONSULT NOTE   Date of service: September 01, 2023 Patient Name: Crystal Huber MRN:  [id number] DOB:  [birth year] Chief Complaint: aphasia and unsteady gait Requesting Provider: Early Glisson, MD  History of Present Illness  Crystal Huber is a 78 y.o. female with hx of multiple sclerosis, breast cancer, hypertension, hyperlipidemia, arthritis and trigeminal neuralgia who presents with sudden onset aphasia and ataxia.  Patient was in her usual state of health sitting with her nursing assistant at a table when she suddenly slumped over and began having difficulty finding her words.  The nursing assistant noted that her gait was also unsteady and called EMS.  She was noted to be hypertensive at that time.  Of note, she had a similar presentation [facility] month with garbled speech and ataxia after taking some over-the-counter Benadryl .  LKW: 1600 Modified rankin score: 2-3 IV Thrombolysis: No, no stroke on MRI EVT: No, no stroke on MRI  NIHSS components Score: Comment  1a Level of Conscious 0[x]  1[]  2[]  3[]      1b LOC Questions 0[]  1[x]  2[]       1c LOC Commands 0[x]  1[]  2[]       2 Best Gaze 0[x]  1[]  2[]       3 Visual 0[x]  1[]  2[]  3[]      4 Facial Palsy 0[x]  1[]  2[]  3[]      5a Motor Arm - left 0[x]  1[]  2[]  3[]  4[]  UN[]    5b Motor Arm - Right 0[x]  1[]  2[]  3[]  4[]  UN[]    6a Motor Leg - Left 0[x]  1[]  2[]  3[]  4[]  UN[]    6b Motor Leg - Right 0[x]  1[]  2[]  3[]  4[]  UN[]    7 Limb Ataxia 0[]  1[]  2[x]  UN[]      8 Sensory 0[x]  1[]  2[]  UN[]      9 Best Language 0[]  1[x]  2[]  3[]      10 Dysarthria 0[]  1[x]  2[]  UN[]      11 Extinct. and Inattention 0[x]  1[]  2[]       TOTAL:5       ROS   Brief ROS negative, full ROS deferred due to acuity of presentation.  Past History   Past Medical History:  Diagnosis Date   Arthritis    right pinkie   Cancer (HCC)    right BR  CA    Cataract    right eye   GERD (gastroesophageal reflux disease)    prn tums, mild not often   Glaucoma     Hyperlipidemia    Hypertension    Macrocytic anemia 09/12/2013   Multiple sclerosis (HCC)    Neuromuscular disorder (HCC)    trigeminal neuralgia   Refusal of blood product    Trigeminal neuralgia of left side of face     Past Surgical History:  Procedure Laterality Date   BREAST LUMPECTOMY Right    wears prosthesis   CATARACT EXTRACTION Left 05/2022   COLONOSCOPY  12/20/2010   ganglion cyst removal Bilateral    NERVE SURGERY  05/2022   PARTIAL HYSTERECTOMY     POLYPECTOMY      Family History: Family History  Problem Relation Age of Onset   Congestive Heart Failure Mother    Heart disease Father    Heart attack Father    Stroke Sister    Breast cancer Sister    Heart disease Sister    Heart disease Brother    Heart disease Brother    Heart disease Brother    Colon cancer Neg Hx    Colon polyps Neg  Hx    Esophageal cancer Neg Hx    Rectal cancer Neg Hx    Stomach cancer Neg Hx     Social History  reports that she has quit smoking. Her smoking use included cigarettes. She has never used smokeless tobacco. She reports current alcohol  use. She reports that she does not use drugs.  Allergies  Allergen Reactions   Carbamazepine      Decreased sodium level   Imipramine      Night sweats, more intense dreams   Metronidazole Nausea Only   Oxcarbazepine     Decreased sodium level   Penicillins Swelling    Yeast infection   Red Blood Cells Other (See Comments)    Jehovah's witness, does not want blood products   Statins     Gets hyponatremia and severe muscle pain   Sulfa Antibiotics Swelling    yeast infection    Medications   Current Facility-Administered Medications:    sodium chloride  flush (NS) 0.9 % injection 3 mL, 3 mL, Intravenous, Once, Early Glisson, MD  Current Outpatient Medications:    aspirin  EC 81 MG tablet, Take 1 tablet (81 mg total) by mouth daily. Swallow whole., Disp: 30 tablet, Rfl: 12   baclofen  (LIORESAL ) 10 MG tablet, Take 0.5-1 tablets  (5-10 mg total) by mouth 3 (three) times daily. (Patient not taking: Reported on 08/01/2023), Disp: 30 each, Rfl: 0   Biotin w/ Vitamins C & E (HAIR/SKIN/NAILS PO), Take 1 tablet by mouth daily., Disp: , Rfl:    Calcium Carb-Cholecalciferol (CALCIUM 500 + D PO), Take by mouth., Disp: , Rfl:    ezetimibe  (ZETIA ) 10 MG tablet, Take 1 tablet (10 mg total) by mouth daily., Disp: 90 tablet, Rfl: 3   gabapentin  (NEURONTIN ) 300 MG capsule, TAKE 1 CAPSULE BY MOUTH THREE TIMES DAILY, Disp: 270 capsule, Rfl: 2   hydrALAZINE  (APRESOLINE ) 50 MG tablet, Take 1 tablet (50 mg total) by mouth 2 (two) times daily., Disp: 180 tablet, Rfl: 3   lacosamide  (VIMPAT ) 50 MG TABS tablet, Take 1 tablet (50 mg total) by mouth 2 (two) times daily., Disp: 60 tablet, Rfl: 2   lamoTRIgine  (LAMICTAL ) 150 MG tablet, Take 2 tablets (300 mg total) by mouth 2 (two) times daily., Disp: 360 tablet, Rfl: 1   latanoprost  (XALATAN ) 0.005 % ophthalmic solution, INSTILL 2 DROPS IN BOTH EYES AT BEDTIME, Disp: 2.5 mL, Rfl: 0   meclizine  (ANTIVERT ) 25 MG tablet, TAKE 1 TABLET(25 MG) BY MOUTH DAILY AS NEEDED FOR DIZZINESS, Disp: 30 tablet, Rfl: 11   Multiple Vitamin (MULTIVITAMIN) tablet, Take 1 tablet by mouth daily., Disp: , Rfl:    sertraline  (ZOLOFT ) 25 MG tablet, Take 1 tablet (25 mg total) by mouth daily., Disp: 30 tablet, Rfl: 1   Suzetrigine 50 MG TABS, Take 50 mg by mouth 2 (two) times daily., Disp: , Rfl:    timolol  (TIMOPTIC ) 0.5 % ophthalmic solution, Place 1 drop into both eyes 2 (two) times daily., Disp: , Rfl:   Vitals   Vitals:   09/01/23 1700  Weight: 56.2 kg    Body mass index is 21.6 kg/m.   Physical Exam   Constitutional: Appears well-developed and well-nourished.  Psych: Affect appropriate to situation.  Eyes: No scleral injection.  HENT: No OP obstruction.  Head: Normocephalic.  Respiratory: Effort normal, non-labored breathing.  Skin: WDI.   Neurologic Examination    NEURO:  Mental Status: Alert and  oriented to person place, month and situation but gives wrong age. Speech/Language: speech is with  mild dysarthria and some word finding difficulty.  Able to name most but not all objects  Cranial Nerves:  II: PERRL. Visual fields full.  III, IV, VI: EOMI. Eyelids elevate symmetrically.  V: Sensation is intact to light touch and symmetrical to face.  VII: Smile is symmetrical.  VIII: hearing intact to voice. IX, X: Phonation is normal.  XII: tongue is midline without fasciculations. Motor: Able to move all 4 extremities with good antigravity strength Tone: is normal and bulk is normal Sensation- Intact to light touch bilaterally.  Coordination: FTN intact bilaterally, HKS: Ataxia in bilateral lower extremities Gait-unsteady and ataxic, of note, patient states she uses a cane or walker at home to prevent falling   Labs/Imaging/Neurodiagnostic studies   CBC:  Recent Labs  Lab September 24, 2023 1732  HGB 13.6  HCT 40.0   Basic Metabolic Panel:  Lab Results  Component Value Date   NA 137 09/24/23   K 3.0 (L) Sep 24, 2023   CO2 29 08/01/2023   GLUCOSE 109 (H) 24-Sep-2023   BUN 9 September 24, 2023   CREATININE 0.80 Sep 24, 2023   CALCIUM 9.5 08/01/2023   GFRNONAA >60 01/19/2022   GFRAA 111 06/02/2019   Lipid Panel:  Lab Results  Component Value Date   LDLCALC 87 10/14/2022   HgbA1c:  Lab Results  Component Value Date   HGBA1C 5.4 01/16/2022   Urine Drug Screen: No results found for: LABOPIA, COCAINSCRNUR, LABBENZ, AMPHETMU, THCU, LABBARB  Alcohol  Level     Component Value Date/Time   ETH <10 01/15/2022 1640   INR  Lab Results  Component Value Date   INR 1.0 01/15/2022   APTT  Lab Results  Component Value Date   APTT 38 (H) 01/15/2022   AED levels: No results found for: PHENYTOIN, ZONISAMIDE, LAMOTRIGINE , LEVETIRACETA  CT Head without contrast(Personally reviewed): No acute abnormality  CT angio Head and Neck with contrast(Personally reviewed): No  LVO, severe stenosis of right PCA P1 stenosis, mild stenosis of the left PCA P2 segment minutes  MRI Brain(Personally reviewed): No acute abnormality   ASSESSMENT   ANNSLEY AKKERMAN is a 78 y.o. female  with hx of multiple sclerosis, breast cancer, hypertension, hyperlipidemia, arthritis and trigeminal neuralgia who presents with sudden onset aphasia and ataxia.  She was in her usual state of health until about 1600, when she suddenly slumped over and then began having difficulties with word findings.  Her nursing assistant, who was with her, assisted her to get up and noted that her gait was unsteady.  Of note, she had a similar episode in May after she had taken some over-the-counter diphenhydramine .  [facility] and stayed in rehabilitation for a couple weeks and was then discharged with 24/7 care at home.  Initial CT head was negative for acute abnormality, and CTA revealed no LVO.  Patient was taken for stat MRI of the brain, which revealed no acute infarct.  Symptoms are likely recrudescence of old multiple sclerosis symptoms, possibly in the setting of an acute stressor.  It is uncertain why patient was hypertensive when her symptoms began, perhaps that was just sympathetic response?  RECOMMENDATIONS  -Routine EEG - Will speak with patient's nursing assistant or other caregivers and attempt to get better history of today's event. ______________________________________________________________________  Patient seen by NP with MD, MD to edit note as needed.  Signed, Crystal E Bucky Cardinal, NP [facility].   NEUROHOSPITALIST ADDENDUM Performed a face to face diagnostic evaluation.   I have reviewed the contents  of history and physical exam as documented by PA/ARNP/Resident and agree with above documentation.  I have discussed and formulated the above plan as documented. Crystal to the note have been made as needed.  Impression/Key exam findings/Plan: Presenting with  some aphasia possibly out of proportion to her confusion along with ataxia in BL lower extremities. Ataxia was noted in the past neurology notes. Hard to be sure if there is an acute infarct vs recrudescence of prior MS symptoms. Chart review shows recent admission at [facility] for very similar presentation that was felt to be secondary to OTC benadryl  use. MRI back then was negative. We therefore decided to take her to MRI rather than offer tnkase. MRI Brain is negative for stroke. She did get some ativan  for MRI but is now drowsy and history unreliable. I spoke wither her friend who is not her HCPOA. She was not present today when EMS were called. She reports that Crystal Huber  who is patient's nursing assistant was the one who called EMS. Tried to get in touch with Crystal Huber but unable to. She is on her way to the hospital per patient and will see if we can get additional history from her.  Rebel Laughridge, MD [facility] [phone]   If 7pm to 7am, please call on call as listed on AMION.

## 2023-09-02 ENCOUNTER — Ambulatory Visit: Payer: Self-pay

## 2023-09-02 ENCOUNTER — Telehealth: Payer: Self-pay | Admitting: Neurology

## 2023-09-02 NOTE — Telephone Encounter (Signed)
 FYI Only or Action Required?: FYI only for provider  Patient was last seen in primary care on 08/01/2023 by Verma Gobble, NP. Called Nurse Triage reporting Hypertension. Symptoms began several days ago. Interventions attempted: Rest, hydration, or home remedies. Symptoms are: gradually improving.  Triage Disposition: See PCP When Office is Open (Within 3 Days)  Patient/caregiver understands and will follow disposition?: Yes   Copied from CRM [id number]. Topic: Clinical - Red Word Triage >> Sep 02, 2023  1:51 PM Retta Caster wrote: Red Word that prompted transfer to Nurse Triage: 225/100 on and ER on 06/16  BP 06/17150/80 06/17 Reason for Disposition  Systolic BP  >= 160 OR Diastolic >= 100  Answer Assessment - Initial Assessment Questions 1. BLOOD PRESSURE: What is the blood pressure? Did you take at least two measurements 5 minutes apart?     ER 09/01/23 for fall on Sunday after dizziness 2. ONSET: When did you take your blood pressure?     Last night-150/80 at 2am 3. HOW: How did you take your blood pressure? (e.g., automatic home BP monitor, visiting nurse)     Home cuff 4. HISTORY: Do you have a history of high blood pressure?     Yes 5. MEDICINES: Are you taking any medicines for blood pressure? Have you missed any doses recently?     Taking as directed 6. OTHER SYMPTOMS: Do you have any symptoms? (e.g., blurred vision, chest pain, difficulty breathing, headache, weakness)      Additional info:  MRI was within normal limits.  Protocols used: Blood Pressure - High-A-AH

## 2023-09-02 NOTE — Telephone Encounter (Signed)
 I called pt to confirm Monday 09/08/23 appt and she wanted me to leave a note for Dr. Godwin Lat nurse that she was just in the hospital for her wrist and they messed up some of her dosing on her meds. She wasn't quite clear in what she was saying, but I wanted to make sure you knew. She will talk to you when you call.

## 2023-09-02 NOTE — Procedures (Signed)
 Patient Name: Crystal Huber  MRN: [id number]  Epilepsy Attending: Arleene Lack  Referring Physician/Provider: Colon Dear, NP  Date: 09/01/2023 Duration: 22.10 mins  Patient history:  78 y.o. female  with hx of multiple sclerosis, breast cancer, hypertension, hyperlipidemia, arthritis and trigeminal neuralgia who presents with sudden onset aphasia and ataxia. EEG to evaluate for seizure.  Level of alertness: Awake  AEDs during EEG study: Ativan   Technical aspects: This EEG study was done with scalp electrodes positioned according to the 10-20 International system of electrode placement. Electrical activity was reviewed with band pass filter of 1-70Hz , sensitivity of 7 uV/mm, display speed of 44mm/sec with a 60Hz  notched filter applied as appropriate. EEG data were recorded continuously and digitally stored.  Video monitoring was available and reviewed as appropriate.  Description: The posterior dominant rhythm consists of 8-9 Hz activity of moderate voltage (25-35 uV) seen predominantly in posterior head regions, symmetric and reactive to eye opening and eye closing. EEG showed intermittent generalized 3 to 6 Hz theta-delta slowing. Hyperventilation and photic stimulation were not performed.     ABNORMALITY - Intermittent slow, generalized  IMPRESSION: This study is suggestive of mild diffuse encephalopathy. No seizures or epileptiform discharges were seen throughout the recording.  Diarra Kos O Larron Armor

## 2023-09-02 NOTE — Telephone Encounter (Signed)
 Called pt at [phone]. Mailbox full, unable to LVM

## 2023-09-03 ENCOUNTER — Encounter: Admitting: Adult Health

## 2023-09-03 DIAGNOSIS — R270 Ataxia, unspecified: Secondary | ICD-10-CM | POA: Diagnosis not present

## 2023-09-03 DIAGNOSIS — R001 Bradycardia, unspecified: Secondary | ICD-10-CM | POA: Diagnosis not present

## 2023-09-03 DIAGNOSIS — G35 Multiple sclerosis: Secondary | ICD-10-CM | POA: Diagnosis not present

## 2023-09-03 DIAGNOSIS — R531 Weakness: Secondary | ICD-10-CM | POA: Diagnosis not present

## 2023-09-03 DIAGNOSIS — I6782 Cerebral ischemia: Secondary | ICD-10-CM | POA: Diagnosis not present

## 2023-09-03 DIAGNOSIS — I63511 Cerebral infarction due to unspecified occlusion or stenosis of right middle cerebral artery: Secondary | ICD-10-CM | POA: Diagnosis not present

## 2023-09-03 DIAGNOSIS — Z743 Need for continuous supervision: Secondary | ICD-10-CM | POA: Diagnosis not present

## 2023-09-03 DIAGNOSIS — R55 Syncope and collapse: Secondary | ICD-10-CM | POA: Diagnosis not present

## 2023-09-03 DIAGNOSIS — I10 Essential (primary) hypertension: Secondary | ICD-10-CM | POA: Diagnosis not present

## 2023-09-03 DIAGNOSIS — R251 Tremor, unspecified: Secondary | ICD-10-CM | POA: Diagnosis not present

## 2023-09-03 DIAGNOSIS — E785 Hyperlipidemia, unspecified: Secondary | ICD-10-CM | POA: Diagnosis not present

## 2023-09-03 DIAGNOSIS — I509 Heart failure, unspecified: Secondary | ICD-10-CM | POA: Diagnosis not present

## 2023-09-03 DIAGNOSIS — R6889 Other general symptoms and signs: Secondary | ICD-10-CM | POA: Diagnosis not present

## 2023-09-03 DIAGNOSIS — R569 Unspecified convulsions: Secondary | ICD-10-CM | POA: Diagnosis not present

## 2023-09-03 DIAGNOSIS — G459 Transient cerebral ischemic attack, unspecified: Secondary | ICD-10-CM | POA: Diagnosis not present

## 2023-09-03 DIAGNOSIS — I440 Atrioventricular block, first degree: Secondary | ICD-10-CM | POA: Diagnosis not present

## 2023-09-03 DIAGNOSIS — R479 Unspecified speech disturbances: Secondary | ICD-10-CM | POA: Diagnosis not present

## 2023-09-03 DIAGNOSIS — I110 Hypertensive heart disease with heart failure: Secondary | ICD-10-CM | POA: Diagnosis not present

## 2023-09-03 NOTE — Telephone Encounter (Addendum)
 Called pt again since no return call yet. Unable to LVM.

## 2023-09-03 NOTE — Progress Notes (Signed)
 This encounter was created in error - please disregard.

## 2023-09-04 NOTE — Telephone Encounter (Signed)
 Called back and relayed Dr. Thom Fleeting recommendation. They verbalized understanding and will try this.

## 2023-09-04 NOTE — Telephone Encounter (Signed)
 Took call from phone room and spoke w/ pt caregiver. Pt was also on phone and gave verbal permission to speak with her since she was having slurred speech. This past Monday, caregiver called EMS. She fell over the weekend. Appeared she was having a stroke: slurring speech, elevated BP. EMS took her to [facility] and cleared her.   Yesterday, was eating. Looked like she was having a seizure. Slurred speech, uncontrollable body movements. Went to [facility] again. Completed MRI and cleared. Feel it is r/t medication she is taking and recommended she f/u with PCP. She has appt 09/08/23 with PCP as well.   Currently, she will have 24 hr care until she can f/u with our office and PCP.   She is taking lamotrigine  150mg , 2 caps po BID. Takes around 6am and 2pm. Has brace on right hand d/t breaking hand from fall.  They are trying to figure out medical POA for her.  Aware I will send to MD to review and see if he wants to do anything before appt.   Best call back #: [phone]

## 2023-09-05 DIAGNOSIS — R296 Repeated falls: Secondary | ICD-10-CM | POA: Diagnosis not present

## 2023-09-05 DIAGNOSIS — H269 Unspecified cataract: Secondary | ICD-10-CM | POA: Diagnosis not present

## 2023-09-05 DIAGNOSIS — G500 Trigeminal neuralgia: Secondary | ICD-10-CM | POA: Diagnosis not present

## 2023-09-05 DIAGNOSIS — I509 Heart failure, unspecified: Secondary | ICD-10-CM | POA: Diagnosis not present

## 2023-09-05 DIAGNOSIS — I110 Hypertensive heart disease with heart failure: Secondary | ICD-10-CM | POA: Diagnosis not present

## 2023-09-05 DIAGNOSIS — Z9181 History of falling: Secondary | ICD-10-CM | POA: Diagnosis not present

## 2023-09-05 DIAGNOSIS — S52571D Other intraarticular fracture of lower end of right radius, subsequent encounter for closed fracture with routine healing: Secondary | ICD-10-CM | POA: Diagnosis not present

## 2023-09-05 DIAGNOSIS — G35 Multiple sclerosis: Secondary | ICD-10-CM | POA: Diagnosis not present

## 2023-09-05 DIAGNOSIS — H409 Unspecified glaucoma: Secondary | ICD-10-CM | POA: Diagnosis not present

## 2023-09-08 ENCOUNTER — Ambulatory Visit: Payer: Medicare Other | Admitting: Neurology

## 2023-09-08 ENCOUNTER — Encounter: Payer: Self-pay | Admitting: Adult Health

## 2023-09-08 ENCOUNTER — Encounter: Payer: Self-pay | Admitting: Neurology

## 2023-09-08 ENCOUNTER — Ambulatory Visit (INDEPENDENT_AMBULATORY_CARE_PROVIDER_SITE_OTHER): Admitting: Adult Health

## 2023-09-08 VITALS — BP 118/78 | HR 78 | Temp 97.6°F | Resp 18 | Ht 63.5 in | Wt 122.8 lb

## 2023-09-08 VITALS — BP 143/84 | HR 64 | Ht 63.5 in | Wt 121.5 lb

## 2023-09-08 DIAGNOSIS — G500 Trigeminal neuralgia: Secondary | ICD-10-CM

## 2023-09-08 DIAGNOSIS — E7800 Pure hypercholesterolemia, unspecified: Secondary | ICD-10-CM

## 2023-09-08 DIAGNOSIS — I10 Essential (primary) hypertension: Secondary | ICD-10-CM

## 2023-09-08 DIAGNOSIS — R269 Unspecified abnormalities of gait and mobility: Secondary | ICD-10-CM

## 2023-09-08 DIAGNOSIS — R2990 Unspecified symptoms and signs involving the nervous system: Secondary | ICD-10-CM | POA: Diagnosis not present

## 2023-09-08 DIAGNOSIS — G35 Multiple sclerosis: Secondary | ICD-10-CM

## 2023-09-08 DIAGNOSIS — S62101S Fracture of unspecified carpal bone, right wrist, sequela: Secondary | ICD-10-CM | POA: Diagnosis not present

## 2023-09-08 DIAGNOSIS — F32A Depression, unspecified: Secondary | ICD-10-CM

## 2023-09-08 DIAGNOSIS — F419 Anxiety disorder, unspecified: Secondary | ICD-10-CM

## 2023-09-08 DIAGNOSIS — E782 Mixed hyperlipidemia: Secondary | ICD-10-CM | POA: Diagnosis not present

## 2023-09-08 DIAGNOSIS — F413 Other mixed anxiety disorders: Secondary | ICD-10-CM | POA: Diagnosis not present

## 2023-09-08 NOTE — Progress Notes (Signed)
 [facility]  PATIENT: Crystal Huber DOB: June 18, 1945  REFERRING DOCTOR OR PCP:  Virginia  Fulbright SOURCE: Patient  _________________________________   HISTORICAL  CHIEF COMPLAINT:  Chief Complaint  Patient presents with   Follow-up    Pt in room 11. Connie POA in room. Here for MS follow up. Pt reports a few falls. POA said pt has at least 3 falls in last 90 days. Pt said her balance is cause of falls. Pt reports while in hospital her medications were not correct. Pt reports she has noticed if difficult to swallow at time, concerned about weight loss.  Pt has several ER visits.    HISTORY OF PRESENT ILLNESS:  Crystal Huber is a 78 y.o. woman with relapsing remitting MS diagnosed in 2002.  Update  09/08/2023 She went to [facility] ED for concern of seizure - neuro consult felt more likely tremulous due to anxiety.  No altered consciousness.  She feels her MS is stable.  She has been off Rebif  since 2022.  She has had no exacerbations or significant new neurologic symptoms.   Her left trigeminal neuralgia  did better after gamma knife.  However, it still flares up at times.   She is on Lamotrigine  150 mg po tid and gabapentin  300 mg po tid usually but up to 5 during a flare (she just started higher dose).  On this regimen she has generally done well.  Before the gamma knofe, she had an [facility] [place]) with  benefit x 1 month, last done about 1 years ago (Dr. Nancey).   Baclofen  caused sleepiness.   Carbamezapine caused hyponatremia.   She had gamma knife at [facility] in February 2024 (Dr. Nancye and Dr. Candyce) and she feels a lot of benefit.  Before the gamma knife she had a thin section MRI showing a T1 hypodensity near the left trigeminal entry zone.  She has spells of vertigo..Bending over and back up may trigger a spell so she is careful.   She was doing exercises learned in PT but has not done lately.  She is taking  meclizine  once a day     Her shower has grab bar and is a walk in with a shower chair.  Her gait is off balanced, She uses a cane more the past year.   On a good day, she can walk a few hundred feet maybe up to a quarter mile.    She denies much weakness or numbness.  She has mild cognitive impairment  She is not driving. .   Mood is doing well.  Notes feeling sad but no definite depression. She has some anxiety.   She feels less anxiety now that she has a roommate.  She sleeps well most nights.    She tries to eat well and exercise some.     She has an aide twice a week.     Her aide helps with showers.  Labs 03/03/2020 showed low Vit D (24) and she takes supplements.   TSH was fine.  B12 was normal.   LFT and Lymphocytes were fine   `     02/10/2023   11:10 AM 02/04/2022    1:37 PM 03/27/2020    2:03 PM  MMSE - Mini Mental State Exam  Orientation to time 4 5 3   Orientation to Place 5 5 5   Registration 3 3 3   Attention/ Calculation 5 1 2   Recall 3 3 3   Language-  name 2 objects 2 2 2   Language- repeat 1 1 1   Language- follow 3 step command 2 3 3   Language- read & follow direction 1 1 1   Write a sentence 1 1 1   Copy design 1 1 1   Total score 28 26 25    02/04/22: She lost the 4 points due to serial sevens.  She would have scored 30/30 spelling world backwards.    MS History:   She had MRI and LP consistent with the diagnosis of MS in 2002 after presenting with trigeminal neuralgia.   She was started on Rebif .   She is on Rebif  22 mcg, tolerates it well and has had no definite exacerbation since.       MRI of the brain 09/19/2016 shows classic MS lesions.  There were a few foci not present in 2012.  MRI of the brain 12/21/2020 showed no new lesions.   REVIEW OF SYSTEMS: Constitutional: No fevers, chills, sweats, or change in appetite.   Fatigue, worse as the day goes on. Eyes: No visual changes, double vision, eye pain Ear, nose and throat: Has vertigo, No hearing loss, ear pain,  nasal congestion, sore throat Cardiovascular: No chest pain, palpitations Respiratory:  No shortness of breath at rest or with exertion.   No wheezes GastrointestinaI: No nausea, vomiting, diarrhea, abdominal pain, fecal incontinence Genitourinary:  No dysuria, urinary retention or frequency.  No nocturia. Musculoskeletal:  No neck pain, back pain Integumentary: No rash, pruritus, skin lesions Neurological: as above Psychiatric: Some depression and anxiety. Endocrine: No palpitations, diaphoresis, change in appetite, change in weigh or increased thirst Hematologic/Lymphatic:  No anemia, purpura, petechiae..   Bruises easily Allergic/Immunologic: No itchy/runny eyes, nasal congestion, recent allergic reactions, rashes  ALLERGIES: Allergies  Allergen Reactions   Carbamazepine      Decreased sodium level   Imipramine      Night sweats, more intense dreams   Metronidazole Nausea Only   Oxcarbazepine     Decreased sodium level   Penicillins Swelling    Yeast infection   Red Blood Cells Other (See Comments)    Jehovah's witness, does not want blood products   Statins     Gets hyponatremia and severe muscle pain   Sulfa Antibiotics Swelling    yeast infection    HOME MEDICATIONS:  Current Outpatient Medications:    aspirin  EC 81 MG tablet, Take 1 tablet (81 mg total) by mouth daily. Swallow whole., Disp: 30 tablet, Rfl: 12   baclofen  (LIORESAL ) 10 MG tablet, Take 0.5-1 tablets (5-10 mg total) by mouth 3 (three) times daily. (Patient taking differently: Take 5-10 mg by mouth 3 (three) times daily. Taking as needed), Disp: 30 each, Rfl: 0   Biotin w/ Vitamins C & E (HAIR/SKIN/NAILS PO), Take 1 tablet by mouth daily., Disp: , Rfl:    Calcium Carb-Cholecalciferol (CALCIUM 500 + D PO), Take by mouth., Disp: , Rfl:    ezetimibe  (ZETIA ) 10 MG tablet, Take 1 tablet (10 mg total) by mouth daily., Disp: 90 tablet, Rfl: 3   gabapentin  (NEURONTIN ) 300 MG capsule, TAKE 1 CAPSULE BY MOUTH THREE  TIMES DAILY, Disp: 270 capsule, Rfl: 2   hydrALAZINE  (APRESOLINE ) 50 MG tablet, Take 1 tablet (50 mg total) by mouth 2 (two) times daily., Disp: 180 tablet, Rfl: 3   lamoTRIgine  (LAMICTAL ) 150 MG tablet, Take 2 tablets (300 mg total) by mouth 2 (two) times daily., Disp: 360 tablet, Rfl: 1   latanoprost  (XALATAN ) 0.005 % ophthalmic solution, INSTILL 2 DROPS IN BOTH  EYES AT BEDTIME, Disp: 2.5 mL, Rfl: 0   meclizine  (ANTIVERT ) 25 MG tablet, TAKE 1 TABLET(25 MG) BY MOUTH DAILY AS NEEDED FOR DIZZINESS, Disp: 30 tablet, Rfl: 11   Multiple Vitamin (MULTIVITAMIN) tablet, Take 1 tablet by mouth daily., Disp: , Rfl:    potassium chloride  (KLOR-CON ) 10 MEQ tablet, Take 1 tablet (10 mEq total) by mouth daily., Disp: 10 tablet, Rfl: 0   sertraline  (ZOLOFT ) 25 MG tablet, Take 1 tablet (25 mg total) by mouth daily., Disp: 30 tablet, Rfl: 1   Suzetrigine 50 MG TABS, Take 50 mg by mouth 2 (two) times daily., Disp: , Rfl:    timolol  (TIMOPTIC ) 0.5 % ophthalmic solution, Place 1 drop into both eyes 2 (two) times daily., Disp: , Rfl:   PAST MEDICAL HISTORY: Past Medical History:  Diagnosis Date   Arthritis    right pinkie   Cancer (HCC)    right BR  CA    Cataract    right eye   GERD (gastroesophageal reflux disease)    prn tums, mild not often   Glaucoma    Hyperlipidemia    Hypertension    Macrocytic anemia 09/12/2013   Multiple sclerosis (HCC)    Neuromuscular disorder (HCC)    trigeminal neuralgia   Refusal of blood product    Trigeminal neuralgia of left side of face     PAST SURGICAL HISTORY: Past Surgical History:  Procedure Laterality Date   BREAST LUMPECTOMY Right    wears prosthesis   CATARACT EXTRACTION Left 05/2022   COLONOSCOPY  12/20/2010   ganglion cyst removal Bilateral    NERVE SURGERY  05/2022   PARTIAL HYSTERECTOMY     POLYPECTOMY      FAMILY HISTORY: Family History  Problem Relation Age of Onset   Congestive Heart Failure Mother    Heart disease Father    Heart  attack Father    Stroke Sister    Breast cancer Sister    Heart disease Sister    Heart disease Brother    Heart disease Brother    Heart disease Brother    Colon cancer Neg Hx    Colon polyps Neg Hx    Esophageal cancer Neg Hx    Rectal cancer Neg Hx    Stomach cancer Neg Hx     SOCIAL HISTORY:  Social History   Socioeconomic History   Marital status: Widowed    Spouse name: Not on file   Number of children: Not on file   Years of education: Not on file   Highest education level: Not on file  Occupational History   Not on file  Tobacco Use   Smoking status: Former    Types: Cigarettes   Smokeless tobacco: Never   Tobacco comments:    Quit smoking at age 52 and only smoke occasional x 1.5 years   Vaping Use   Vaping status: Never Used  Substance and Sexual Activity   Alcohol  use: Yes    Comment: 1 glass of wine a night   Drug use: No   Sexual activity: Not on file  Other Topics Concern   Not on file  Social History Narrative   Diet      Do you drink/eat things with caffeine      Marital Status     What year were you married?      Do you live in a house, apartment, assisted living, condo, trailer, etc.?      Is it one or more  stories?      How many persons live in your home?         Do you have any pets in your home?(please list)      Highest level of education completed:      Current or past profession:      Do you exercise?:    Type and how often:      Do you have a Living Will? (Form that indicates scenarios where you would not want your life prolonged)      Do you have a DNR form?         If not, would you like to discuss one?      Do you have signed POA/HPOA forms? Yes      Do you have difficulty bathing or dressing yourself?      Do you have difficulty preparing food or eating?      Do you have difficulty managing medications?      Do you have difficulty managing your finances?      Do you have difficulty affording your medications?                      Social Drivers of Corporate investment banker Strain: Not on file  Food Insecurity: Low Risk  (08/04/2023)   Received from [facility]   Hunger Vital Sign    Within the past 12 months, you worried that your food would run out before you got money to buy more: Never true    Within the past 12 months, the food you bought just didn't last and you didn't have money to get more. : Never true  Transportation Needs: No Transportation Needs (08/04/2023)   Received from [facility]    In the past 12 months, has lack of reliable transportation kept you from medical appointments, meetings, work or from getting things needed for daily living? : No  Physical Activity: Not on file  Stress: Not on file  Social Connections: Unknown (07/29/2021)   Received from [facility]   Social Network    Social Network: Not on file  Intimate Partner Violence: Not At Risk (01/16/2022)   Humiliation, Afraid, Rape, and Kick questionnaire    Fear of Current or Ex-Partner: No    Emotionally Abused: No    Physically Abused: No    Sexually Abused: No     PHYSICAL EXAM  Vitals:   09/08/23 1118  BP: (!) 143/84  Pulse: 64  Weight: 121 lb 8 oz (55.1 kg)  Height: 5' 3.5 (1.613 m)    Body mass index is 21.19 kg/m.    General: The patient is well-developed and well-nourished and in no acute distress  Neck:  The neck has good range of motion.  Neurologic Exam  Mental status: The patient is alert and oriented x 3 at the time of the examination. The patient has apparent normal recent and remote memory, with an apparently normal attention span and concentration ability.   Speech is normal.  Cranial nerves: Extraocular movements are full.  There is no nystagmus.  Facial strength and sensation was normal.  She has reduced left-sided hearing.. The Weber does not lateralize.   Motor: Tremor not notable. Muscle bulk is normal.    normal muscle tone in the arms strength is  5  / 5 in all 4 extremities.   Sensory: She has normal symmetric sensation to touch and vibration.  Coordination: Cerebellar testing shows good  finger nose finger and heel to shin.   Gait and station: Station is normal.  She has a wide gait without support.  She turns in 4 steps.  With the walker she has a better stride..  The tandem is wide but probably better than last visit..  Romberg was negative.  Reflexes: Deep tendon reflexes are symmetric and normal bilaterally.        ASSESSMENT AND PLAN  Multiple sclerosis (HCC)  Trigeminal neuralgia  Gait disorder  Other mixed anxiety disorders   1.   She will remain off the Rebif .  In 2025, we will check another MRI of the brain to ensure that there has been stability.  If progression has occurred we would need to reconsider restarting a disease modifying therapy. 2.   TN pain is has been very variable.   She sometimes has spells of confusion so I would like to keep the doses as low as effective.  Reduce Lamotrigine  to 150 mg po bid and gabapentin  300 mg tid (can go up to 4/day if flare)  for Trigeminal neuralgia.     We will stop lacosamide  to simplify.   TN likely due to the MS as no definite vascular loop seen on MRI.    Might be able to drop further 3.   Although she has noted some mild memory loss, cognition is stable.    Advised not to drive.  She has an Engineer, production.  She should only take showers when the aide is around to help 4.   Continue to be active.  Gait is stable.   Continue vitamin D supplements  Rtc 6 months, sooner if problems  40-minute office visit with the majority of the time spent face-to-face for history and physical, discussion/counseling and decision-making.  Additional time with record review and documentation.    This visit is part of a comprehensive longitudinal care medical relationship regarding the patients primary diagnosis of multiple sclerosis and related concerns.   Forest Redwine A. Vear, MD, PhD 09/08/2023, 11:53  AM Certified in Neurology, Clinical Neurophysiology, Sleep Medicine, Pain Medicine and Neuroimaging  [facility] [address], [address] [place], [place] [address] [phone]

## 2023-09-08 NOTE — Progress Notes (Signed)
 [facility] clinic  Provider:  Jereld Serum DNP  Code Status:  Full Code  Goals of Care:     09/01/2023    6:25 PM  Advanced Directives  Does Patient Have a Medical Advance Directive? Yes  Type of Estate agent of [place];Living will     Chief Complaint  Patient presents with   Hospitalization Follow-up    HOSPITAL FOLLOW UP    Discussed the use of AI scribe software for clinical note transcription with the patient, who gave verbal consent to proceed.  HPI: Patient is a 78 y.o. female seen today for a hospitalization follow up. She is accompanied by a caregiver.  She was hospitalized on June 16th due to difficulty walking, slurred speech, possible right-sided facial droop, and dizziness. Upon arrival at the emergency room, her symptoms improved. A code stroke was called, and a CT angiogram of the head showed no acute large vessel occlusion but revealed severe stenosis of the right PCA segment and multifocal mild stenosis of the left PCA P2 segments. An MRI of the brain showed no evidence of acute intracranial abnormality. She was discharged the same day.  She has a history of multiple sclerosis, diagnosed at age 76, and regularly follows up with a neurologist. She takes lamotrigine  300 mg twice a day and gabapentin  300 mg three times a day for trigeminal neuralgia. She underwent gamma knife surgery for left trigeminal neuralgia. She describes the pain from trigeminal neuralgia as severe and 'terrible'.  She experienced a fall on May 16th, resulting in a right wrist fracture. An x-ray on January 16, 2022, showed swelling but no acute fracture. She currently uses a brace on her right wrist and a walker with a right arm rest.  She has hypertension, managed with hydralazine  50 mg twice a day, and hypercholesterolemia, for which she takes Zetia  10 mg daily. Her cholesterol levels were last checked on Aug 04, 2023, showing a total cholesterol of 216 mg/dL and LDL of  882 mg/dL.  She takes Zoloft  25 mg daily for anxiety, which helps with her symptoms. She has experienced weight loss and decreased appetite since starting the medication in April 2025.  She was started on potassium chloride  (KCL, Chlorcon) 10 MEQ daily after her potassium level was found to be 3.7 on September 03, 2023, which is within the normal range but on the lower side.  She lives independently but receives assistance from a caregiver a few days a week. No current slurred speech or weakness. She experiences more difficulty with speech when tired or in crowded situations. No depression but occasional anxiety.    Past Medical History:  Diagnosis Date   Arthritis    right pinkie   Cancer (HCC)    right BR  CA    Cataract    right eye   GERD (gastroesophageal reflux disease)    prn tums, mild not often   Glaucoma    Hyperlipidemia    Hypertension    Macrocytic anemia 09/12/2013   Multiple sclerosis (HCC)    Neuromuscular disorder (HCC)    trigeminal neuralgia   Refusal of blood product    Trigeminal neuralgia of left side of face     Past Surgical History:  Procedure Laterality Date   BREAST LUMPECTOMY Right    wears prosthesis   CATARACT EXTRACTION Left 05/2022   COLONOSCOPY  12/20/2010   ganglion cyst removal Bilateral    NERVE SURGERY  05/2022   PARTIAL HYSTERECTOMY  POLYPECTOMY      Allergies  Allergen Reactions   Carbamazepine      Decreased sodium level   Imipramine      Night sweats, more intense dreams   Metronidazole Nausea Only   Oxcarbazepine     Decreased sodium level   Penicillins Swelling    Yeast infection   Red Blood Cells Other (See Comments)    Jehovah's witness, does not want blood products   Statins     Gets hyponatremia and severe muscle pain   Sulfa Antibiotics Swelling    yeast infection    Outpatient Encounter Medications as of 09/08/2023  Medication Sig   baclofen  (LIORESAL ) 10 MG tablet Take 0.5-1 tablets (5-10 mg total) by mouth 3  (three) times daily.   Biotin w/ Vitamins C & E (HAIR/SKIN/NAILS PO) Take 1 tablet by mouth daily.   Calcium Carb-Cholecalciferol (CALCIUM 500 + D PO) Take by mouth.   ezetimibe  (ZETIA ) 10 MG tablet Take 1 tablet (10 mg total) by mouth daily.   gabapentin  (NEURONTIN ) 300 MG capsule TAKE 1 CAPSULE BY MOUTH THREE TIMES DAILY   hydrALAZINE  (APRESOLINE ) 50 MG tablet Take 1 tablet (50 mg total) by mouth 2 (two) times daily.   lamoTRIgine  (LAMICTAL ) 150 MG tablet Take 2 tablets (300 mg total) by mouth 2 (two) times daily.   latanoprost  (XALATAN ) 0.005 % ophthalmic solution INSTILL 2 DROPS IN BOTH EYES AT BEDTIME   meclizine  (ANTIVERT ) 25 MG tablet TAKE 1 TABLET(25 MG) BY MOUTH DAILY AS NEEDED FOR DIZZINESS   Multiple Vitamin (MULTIVITAMIN) tablet Take 1 tablet by mouth daily.   potassium chloride  (KLOR-CON ) 10 MEQ tablet Take 1 tablet (10 mEq total) by mouth daily.   sertraline  (ZOLOFT ) 25 MG tablet Take 1 tablet (25 mg total) by mouth daily.   Suzetrigine 50 MG TABS Take 50 mg by mouth 2 (two) times daily.   timolol  (TIMOPTIC ) 0.5 % ophthalmic solution Place 1 drop into both eyes 2 (two) times daily.   aspirin  EC 81 MG tablet Take 1 tablet (81 mg total) by mouth daily. Swallow whole.   [DISCONTINUED] lacosamide  (VIMPAT ) 50 MG TABS tablet Take 1 tablet (50 mg total) by mouth 2 (two) times daily.   No facility-administered encounter medications on file as of 09/08/2023.    Review of Systems:  Review of Systems  Constitutional:  Negative for appetite change, chills, fatigue and fever.  HENT:  Negative for congestion, hearing loss, rhinorrhea and sore throat.   Eyes: Negative.   Respiratory:  Negative for cough, shortness of breath and wheezing.   Cardiovascular:  Negative for chest pain, palpitations and leg swelling.  Gastrointestinal:  Negative for abdominal pain, constipation, diarrhea, nausea and vomiting.  Genitourinary:  Negative for dysuria.  Musculoskeletal:  Negative for arthralgias,  back pain and myalgias.  Skin:  Negative for color change, rash and wound.  Neurological:  Negative for dizziness, weakness and headaches.  Psychiatric/Behavioral:  Negative for behavioral problems. The patient is not nervous/anxious.     Health Maintenance  Topic Date Due   Zoster Vaccines- Shingrix (1 of 2) Never done   DTaP/Tdap/Td (3 - Td or Tdap) 10/15/2020   COVID-19 Vaccine (7 - 2024-25 season) 01/16/2024 (Originally 06/30/2023)   INFLUENZA VACCINE  10/17/2023   Medicare Annual Wellness (AWV)  02/10/2024   Pneumococcal Vaccine: 50+ Years  Completed   DEXA SCAN  Completed   Hepatitis C Screening  Completed   HPV VACCINES  Aged Out   Meningococcal B Vaccine  Aged Out  Colonoscopy  Discontinued    Physical Exam: Vitals:   09/08/23 1513  BP: 118/78  Pulse: 78  Resp: 18  Temp: 97.6 F (36.4 C)  SpO2: 94%  Weight: 122 lb 12.8 oz (55.7 kg)  Height: 5' 3.5 (1.613 m)   Body mass index is 21.41 kg/m. Physical Exam Constitutional:      Appearance: Normal appearance.  HENT:     Head: Normocephalic and atraumatic.     Nose: Nose normal.     Mouth/Throat:     Mouth: Mucous membranes are moist.   Eyes:     Conjunctiva/sclera: Conjunctivae normal.    Cardiovascular:     Rate and Rhythm: Normal rate and regular rhythm.  Pulmonary:     Effort: Pulmonary effort is normal.     Breath sounds: Normal breath sounds.  Abdominal:     General: Bowel sounds are normal.     Palpations: Abdomen is soft.   Musculoskeletal:     Cervical back: Normal range of motion.     Comments: Right wrist with brace   Skin:    General: Skin is warm and dry.   Neurological:     General: No focal deficit present.     Mental Status: She is alert and oriented to person, place, and time.   Psychiatric:        Mood and Affect: Mood normal.        Behavior: Behavior normal.        Thought Content: Thought content normal.        Judgment: Judgment normal.     Labs reviewed: Basic  Metabolic Panel: Recent Labs    04/30/23 1528 08/01/23 1150 09/01/23 1727 09/01/23 1732  NA 140 137 139 137  K 3.9 3.9 3.0* 3.0*  CL 100 102 103 102  CO2 33* 29 25  --   GLUCOSE 90 80 109* 109*  BUN 19 13 9 9   CREATININE 0.84 0.89 0.78 0.80  CALCIUM 9.6 9.5 9.1  --   MG  --  2.2  --   --   TSH 1.41  --   --   --    Liver Function Tests: Recent Labs    10/14/22 1134 04/30/23 1528 09/01/23 1727  AST 19 15 22   ALT 14 12 13   ALKPHOS  --   --  93  BILITOT 0.5 0.3 0.8  PROT 6.6 6.7 6.6  ALBUMIN  --   --  3.6   No results for input(s): LIPASE, AMYLASE in the last 8760 hours. No results for input(s): AMMONIA in the last 8760 hours. CBC: Recent Labs    04/30/23 1528 09/01/23 1727 09/01/23 1732  WBC 6.1 5.7  --   NEUTROABS 3,867 3.5  --   HGB 13.4 13.2 13.6  HCT 41.3 40.3 40.0  MCV 96.3 94.4  --   PLT 259 241  --    Lipid Panel: Recent Labs    10/14/22 1134  CHOL 216*  HDL 115  LDLCALC 87  TRIG 54  CHOLHDL 1.9   Lab Results  Component Value Date   HGBA1C 5.4 01/16/2022    Procedures since last visit: EEG adult Result Date: 09/01/2023 Shelton Arlin KIDD, MD     09/02/2023  9:54 AM Patient Name: TIMICA MARCOM MRN: [id number] Epilepsy Attending: Arlin KIDD Shelton Referring Physician/Provider: everitt Clint Abbey Earle FORBES, NP Date: 09/01/2023 Duration: 22.10 mins Patient history:  78 y.o. female  with hx of multiple sclerosis, breast cancer, hypertension, hyperlipidemia, arthritis  and trigeminal neuralgia who presents with sudden onset aphasia and ataxia. EEG to evaluate for seizure. Level of alertness: Awake AEDs during EEG study: Ativan  Technical aspects: This EEG study was done with scalp electrodes positioned according to the 10-20 International system of electrode placement. Electrical activity was reviewed with band pass filter of 1-70Hz , sensitivity of 7 uV/mm, display speed of 41mm/sec with a 60Hz  notched filter applied as appropriate. EEG data were recorded  continuously and digitally stored.  Video monitoring was available and reviewed as appropriate. Description: The posterior dominant rhythm consists of 8-9 Hz activity of moderate voltage (25-35 uV) seen predominantly in posterior head regions, symmetric and reactive to eye opening and eye closing. EEG showed intermittent generalized 3 to 6 Hz theta-delta slowing. Hyperventilation and photic stimulation were not performed.   ABNORMALITY - Intermittent slow, generalized IMPRESSION: This study is suggestive of mild diffuse encephalopathy. No seizures or epileptiform discharges were seen throughout the recording. Arlin MALVA Krebs   MR BRAIN WO CONTRAST Result Date: 09/01/2023 CLINICAL DATA:  Neuro deficit, acute, stroke suspected EXAM: MRI HEAD WITHOUT CONTRAST TECHNIQUE: Multiplanar, multiecho pulse sequences of the brain and surrounding structures were obtained without intravenous contrast. COMPARISON:  Same day CT head. FINDINGS: Brain: No acute infarction, hemorrhage, hydrocephalus, extra-axial collection or mass lesion. 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 Patchy T2/FLAIR hyperintensities the white matter, nonspecific but compatible with chronic microvascular disease. Cerebral atrophy. Vascular: Major arterial flow voids are maintained. Skull and upper cervical spine: Normal marrow signal. Sinuses/Orbits: Negative. Other: No mastoid effusions. IMPRESSION: 1. No evidence of acute intracranial abnormality. 2. Remote left cerebellar and right frontal infarcts. Electronically Signed   By: Gilmore GORMAN Molt M.D.   On: 09/01/2023 18:47   CT ANGIO HEAD NECK W WO CM (CODE STROKE) Result Date: 09/01/2023 EXAM: CTA HEAD AND NECK WITH AND WITHOUT 09/01/2023 05:41:17 PM TECHNIQUE: CTA of the head and neck was performed with and without the administration of intravenous contrast. Multiplanar 2D and/or 3D reformatted images are provided for review. Automated exposure control, iterative  reconstruction, and/or weight based adjustment of the mA/kV was utilized to reduce the radiation dose to as low as reasonably achievable. Stenosis of the internal carotid arteries measured using NASCET criteria. COMPARISON: Head CT 09/01/2023 CLINICAL HISTORY: FINDINGS: CTA NECK: AORTIC ARCH AND ARCH VESSELS: Atherosclerotic calcifications of the aortic arch. No dissection or arterial injury. No significant stenosis of the brachiocephalic or subclavian arteries. CERVICAL CAROTID ARTERIES: Mild calcified plaque along the right carotid bulb without hemodynamically significant stenosis. Mixed plaque along the proximal left cervical ICA results in 60% stenosis by NASCET criteria. No dissection or arterial injury. CERVICAL VERTEBRAL ARTERIES: No dissection, arterial injury, or significant stenosis. LUNGS AND MEDIASTINUM: Unremarkable. SOFT TISSUES: No acute abnormality. BONES: No acute abnormality. CTA HEAD: ANTERIOR CIRCULATION: Atherosclerotic calcifications of the ICA siphons without significant stenosis or aneurysm. No significant stenosis of the anterior cerebral arteries. No significant stenosis of the middle cerebral arteries. No aneurysm. POSTERIOR CIRCULATION: Severe stenosis of the right PCA p1 segment. Multifocal mild stenosis of the left PCA p2 segments. No significant stenosis of the basilar artery. No significant stenosis of the vertebral arteries. No aneurysm. OTHER: No dural venous sinus thrombosis on this non-dedicated study. IMPRESSION: 1. No acute large vessel occlusion. 2. 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 3. Mixed plaque along the proximal left cervical ICA resulting in 60% stenosis. Electronically signed by: Ryan Chess MD 09/01/2023 06:07  PM EDT RP Workstation: [id number]   CT HEAD CODE STROKE WO CONTRAST Result Date: 09/01/2023 CLINICAL DATA:  Code stroke.  Neuro deficit, acute, stroke suspected EXAM: CT HEAD WITHOUT  CONTRAST TECHNIQUE: Contiguous axial images were obtained from the base of the skull through the vertex without intravenous contrast. RADIATION DOSE REDUCTION: This exam was performed according to the departmental dose-optimization program which includes automated exposure control, adjustment of the mA and/or kV according to patient size and/or use of iterative reconstruction technique. COMPARISON:  MRI head May 19, 25. CT head Aug 04, 2023. FINDINGS: Brain: Similar remote infarct in the right frontal lobe. Similar patchy in white matter hypodensities, compatible with chronic microvascular ischemic disease. No evidence of acute large vascular territory infarct, acute hemorrhage, mass lesion, midline shift or hydrocephalus. Cerebral atrophy. Vascular: No hyperdense vessel.  Calcific atherosclerosis. Skull: No acute fracture. Sinuses/Orbits: Clear sinuses.  No acute orbital findings. ASPECTS [facility] Stroke Program Early CT Score) Total score (0-10 with 10 being normal): 10. IMPRESSION: 1. No evidence of acute intracranial abnormality.  ASPECTS is 10. 2. Remote right frontal infarct. Code stroke imaging results were communicated on 09/01/2023 at 5:44 pm to provider Dr. Vanessa via secure text paging. Electronically Signed   By: Gilmore GORMAN Molt M.D.   On: 09/01/2023 17:45    Assessment/Plan  1. Stroke-like symptoms (Primary) -  -  No acute stroke confirmed. No seizures on EEG. - Follow up with neurologist as needed  2. Multiple sclerosis (HCC) -  Relapsing-remitting MS well-managed. Gabapentin  used for MS and trigeminal neuralgia. Not driving due to symptoms. - Continue follow-up with neurologist Dr. Vear. - Continue current medication regimen as per neurology instructions.  3. Trigeminal neuralgia -  Severe left-sided pain. Some relief post-gamma knife surgery. Gabapentin  prescribed. Discussed Susetrigine due to cost and insurance issues. - Continue gabapentin  300 mg three times a day for pain  management. - Discuss potential new pain medication (Susetrigine) with neurologist.  4. Closed fracture of right wrist, sequela -  x-ray of right wrist on 08/26/23 demonstrated mildly impacted distal radius fracture -  Using brace and walker with arm rest. Advised to keep wrist elevated. - Follow up with orthopedic specialist for right wrist fracture management. - Continue using the brace and walker with right arm rest.  5. Primary hypertension -  Managed with hydralazine . - Continue hydralazine  50 mg twice a day.  6. Mixed hyperlipidemia -  Total cholesterol 216 mg/dL, triglycerides 896 mg/dL, LDL 882 mg/dL. Zetia  prescribed. - Continue Zetia  10 mg daily.  7. Anxiety -  Managed with Zoloft . Reports occasional low mood but denies depression. - Continue Zoloft  25 mg daily for anxiety management.        Labs/tests ordered:   None   Return if symptoms worsen or fail to improve.  Jontez Redfield Medina-Vargas, NP

## 2023-09-08 NOTE — Patient Instructions (Signed)
    [facility] [place]  [address]  [place], [place] [address]  [phone]  Verta Emeline Sieving, PA-C  [address] [place], [place] [address]  [phone] (Work)  [phone] (Fax)  Right wrist pain (Primary Dx)

## 2023-09-12 DIAGNOSIS — F411 Generalized anxiety disorder: Secondary | ICD-10-CM

## 2023-09-12 DIAGNOSIS — I110 Hypertensive heart disease with heart failure: Secondary | ICD-10-CM | POA: Diagnosis not present

## 2023-09-12 DIAGNOSIS — I509 Heart failure, unspecified: Secondary | ICD-10-CM | POA: Diagnosis not present

## 2023-09-12 DIAGNOSIS — G500 Trigeminal neuralgia: Secondary | ICD-10-CM | POA: Diagnosis not present

## 2023-09-12 DIAGNOSIS — R296 Repeated falls: Secondary | ICD-10-CM | POA: Diagnosis not present

## 2023-09-12 DIAGNOSIS — S52571D Other intraarticular fracture of lower end of right radius, subsequent encounter for closed fracture with routine healing: Secondary | ICD-10-CM | POA: Diagnosis not present

## 2023-09-12 DIAGNOSIS — Z9181 History of falling: Secondary | ICD-10-CM | POA: Diagnosis not present

## 2023-09-12 DIAGNOSIS — H409 Unspecified glaucoma: Secondary | ICD-10-CM | POA: Diagnosis not present

## 2023-09-12 DIAGNOSIS — G35 Multiple sclerosis: Secondary | ICD-10-CM | POA: Diagnosis not present

## 2023-09-12 DIAGNOSIS — H269 Unspecified cataract: Secondary | ICD-10-CM | POA: Diagnosis not present

## 2023-09-13 DIAGNOSIS — S52571D Other intraarticular fracture of lower end of right radius, subsequent encounter for closed fracture with routine healing: Secondary | ICD-10-CM | POA: Diagnosis not present

## 2023-09-13 DIAGNOSIS — I509 Heart failure, unspecified: Secondary | ICD-10-CM | POA: Diagnosis not present

## 2023-09-13 DIAGNOSIS — G500 Trigeminal neuralgia: Secondary | ICD-10-CM | POA: Diagnosis not present

## 2023-09-13 DIAGNOSIS — H269 Unspecified cataract: Secondary | ICD-10-CM | POA: Diagnosis not present

## 2023-09-13 DIAGNOSIS — R296 Repeated falls: Secondary | ICD-10-CM | POA: Diagnosis not present

## 2023-09-13 DIAGNOSIS — G35 Multiple sclerosis: Secondary | ICD-10-CM | POA: Diagnosis not present

## 2023-09-13 DIAGNOSIS — Z9181 History of falling: Secondary | ICD-10-CM | POA: Diagnosis not present

## 2023-09-13 DIAGNOSIS — H409 Unspecified glaucoma: Secondary | ICD-10-CM | POA: Diagnosis not present

## 2023-09-13 DIAGNOSIS — I110 Hypertensive heart disease with heart failure: Secondary | ICD-10-CM | POA: Diagnosis not present

## 2023-09-23 DIAGNOSIS — M25531 Pain in right wrist: Secondary | ICD-10-CM | POA: Diagnosis not present

## 2023-09-24 DIAGNOSIS — R296 Repeated falls: Secondary | ICD-10-CM | POA: Diagnosis not present

## 2023-09-24 DIAGNOSIS — Z9181 History of falling: Secondary | ICD-10-CM | POA: Diagnosis not present

## 2023-09-24 DIAGNOSIS — G500 Trigeminal neuralgia: Secondary | ICD-10-CM | POA: Diagnosis not present

## 2023-09-24 DIAGNOSIS — I110 Hypertensive heart disease with heart failure: Secondary | ICD-10-CM | POA: Diagnosis not present

## 2023-09-24 DIAGNOSIS — H269 Unspecified cataract: Secondary | ICD-10-CM | POA: Diagnosis not present

## 2023-09-24 DIAGNOSIS — G35 Multiple sclerosis: Secondary | ICD-10-CM | POA: Diagnosis not present

## 2023-09-24 DIAGNOSIS — I509 Heart failure, unspecified: Secondary | ICD-10-CM | POA: Diagnosis not present

## 2023-09-24 DIAGNOSIS — S52571D Other intraarticular fracture of lower end of right radius, subsequent encounter for closed fracture with routine healing: Secondary | ICD-10-CM | POA: Diagnosis not present

## 2023-09-24 DIAGNOSIS — H409 Unspecified glaucoma: Secondary | ICD-10-CM | POA: Diagnosis not present

## 2023-09-26 NOTE — Progress Notes (Signed)
because

## 2023-10-12 ENCOUNTER — Other Ambulatory Visit: Payer: Self-pay | Admitting: Nurse Practitioner

## 2023-10-12 DIAGNOSIS — F419 Anxiety disorder, unspecified: Secondary | ICD-10-CM

## 2023-10-12 DIAGNOSIS — F32A Depression, unspecified: Secondary | ICD-10-CM

## 2023-10-20 ENCOUNTER — Ambulatory Visit: Admitting: Nurse Practitioner

## 2023-10-23 ENCOUNTER — Other Ambulatory Visit: Payer: Medicare Other

## 2023-10-27 DIAGNOSIS — M25531 Pain in right wrist: Secondary | ICD-10-CM | POA: Diagnosis not present

## 2023-10-30 ENCOUNTER — Telehealth: Payer: Self-pay | Admitting: Neurology

## 2023-10-30 NOTE — Telephone Encounter (Signed)
 Pt called wanting to speak to the RN regarding her lamoTRIgine  (LAMICTAL ) 150 MG tablet  She states that she does not have it scheduled to take between 7-9pm and sometimes she has trouble and would need one around that time. Pt stated that maybe it can be put as needed if possible. Please advise.

## 2023-10-30 NOTE — Telephone Encounter (Addendum)
 Spoke w/ Dr. Vear- Rx lamotrigine  150mg  written for pt to take 2 tablets po BID. Ok to change for her to take 1 tablet in the morning, 1 tablet in the afternoon and 2 tablets in the evening.  Called pt at [phone]. She has not been taking lamotrigine  150 tablet, 2 tabs po BID. She will try this dosing schedule. If this is ineffective, she will call back.

## 2023-11-05 DIAGNOSIS — S42254A Nondisplaced fracture of greater tuberosity of right humerus, initial encounter for closed fracture: Secondary | ICD-10-CM | POA: Diagnosis not present

## 2023-11-05 DIAGNOSIS — S42209A Unspecified fracture of upper end of unspecified humerus, initial encounter for closed fracture: Secondary | ICD-10-CM | POA: Diagnosis not present

## 2023-11-05 DIAGNOSIS — M79601 Pain in right arm: Secondary | ICD-10-CM | POA: Diagnosis not present

## 2023-11-05 DIAGNOSIS — S42214A Unspecified nondisplaced fracture of surgical neck of right humerus, initial encounter for closed fracture: Secondary | ICD-10-CM | POA: Diagnosis not present

## 2023-11-05 DIAGNOSIS — M8000XD Age-related osteoporosis with current pathological fracture, unspecified site, subsequent encounter for fracture with routine healing: Secondary | ICD-10-CM | POA: Diagnosis not present

## 2023-11-10 ENCOUNTER — Encounter: Payer: Self-pay | Admitting: Family

## 2023-11-10 ENCOUNTER — Ambulatory Visit (INDEPENDENT_AMBULATORY_CARE_PROVIDER_SITE_OTHER): Admitting: Family

## 2023-11-10 VITALS — BP 126/100 | HR 90 | Temp 97.4°F | Resp 16 | Ht 63.5 in | Wt 122.8 lb

## 2023-11-10 DIAGNOSIS — R42 Dizziness and giddiness: Secondary | ICD-10-CM

## 2023-11-10 DIAGNOSIS — M810 Age-related osteoporosis without current pathological fracture: Secondary | ICD-10-CM

## 2023-11-10 DIAGNOSIS — I10 Essential (primary) hypertension: Secondary | ICD-10-CM | POA: Diagnosis not present

## 2023-11-10 DIAGNOSIS — M25511 Pain in right shoulder: Secondary | ICD-10-CM | POA: Diagnosis not present

## 2023-11-10 DIAGNOSIS — R296 Repeated falls: Secondary | ICD-10-CM

## 2023-11-10 DIAGNOSIS — G35 Multiple sclerosis: Secondary | ICD-10-CM | POA: Diagnosis not present

## 2023-11-10 NOTE — Progress Notes (Signed)
 Provider: Toshiyuki Fredell FNP-C  Caro Harlene POUR, NP  Patient Care Team: Caro Harlene POUR, NP as PCP - General (Geriatric Medicine)  Extended Emergency Contact Information Primary Emergency Contact: McClinton,Ruby Work Phone: [phone] Relation: Friend Secondary Emergency Contact: Trudy Bale Work Phone: [phone] Relation: Friend  Code Status:  DNR Goals of care: Advanced Directive information    11/10/2023    1:05 PM  Advanced Directives  Does Patient Have a Medical Advance Directive? Yes  Type of Estate agent of [place];Living will;Out of facility DNR (pink MOST or yellow form)  Does patient want to make changes to medical advance directive? No - Patient declined  Copy of Healthcare Power of Attorney in Chart? Yes - validated most recent copy scanned in chart (See row information)     Chief Complaint  Patient presents with   Fall    Patient had fall and is now dizzy.    Discussed the use of AI scribe software for clinical note transcription with the patient, who gave verbal consent to proceed.  History of Present Illness  Crystal Huber is a 78 year old female with a history of multiple falls who presents with a recent fall resulting in a neck and shoulder injury.  She experienced a fall on Wednesday morning, resulting in injuries to her neck and shoulder. She did not visit the hospital but consulted her orthopedic doctor, who confirmed a shoulder fracture and recommended wearing a sling for three weeks. She experiences pain in her shoulder and takes Tylenol  500 mg for relief, with doses taken in the morning, afternoon, and at bedtime.  She has a history of frequent falls throughout the year, with a recent recovery from a wrist fracture. She is concerned about her frequent falls and mentions experiencing dizziness, which she attributes to her chronic dizziness condition. She takes medication for dizziness every morning. Her medication  regimen includes eye drops, biotin, a multivitamin, aspirin , calcium, vitamin D, Zetia  10 mg for cholesterol, and gabapentin  300 mg three times a day. She reports drowsiness after taking gabapentin , which she associates with increased fall risk.  She has a history of multiple sclerosis diagnosed at age 45, which she describes as the 'lowest progressive kind.' She notes a decline in balance and gait over time, contributing to her fall risk. She uses a walker and has canes at home but prefers the walker for stability. She has not engaged in physical therapy recently due to insurance limitations but has had extensive therapy in the past.  She takes hydralazine  50 mg twice daily for blood pressure, which she monitors at home. Her blood pressure readings have been variable, with a recent high reading of 126/100. Rechecked 118/100.States blood pressure at home have been in the 130's /80's. She has a history of osteoporosis and is awaiting an appointment with her orthopedic doctor to address this concern.  She reports shoulder pain, dizziness, and occasional urinary symptoms if she does not drink enough water. No current urinary tract infection symptoms.  Also request DNR formed to be completed.States in a case of cardiopulmonary event she does not wanted to be resuscitated.Discussed MOST form but would lie to talk more with her HCPOA prior to completing the MOST form.Just wants the DNR form .    Past Medical History:  Diagnosis Date   Arthritis    right pinkie   Cancer (HCC)    right BR  CA    Cataract    right eye   GERD (gastroesophageal reflux  disease)    prn tums, mild not often   Glaucoma    Hyperlipidemia    Hypertension    Macrocytic anemia 09/12/2013   Multiple sclerosis (HCC)    Neuromuscular disorder (HCC)    trigeminal neuralgia   Refusal of blood product    Trigeminal neuralgia of left side of face    Past Surgical History:  Procedure Laterality Date   BREAST LUMPECTOMY Right     wears prosthesis   CATARACT EXTRACTION Left 05/2022   COLONOSCOPY  12/20/2010   ganglion cyst removal Bilateral    NERVE SURGERY  05/2022   PARTIAL HYSTERECTOMY     POLYPECTOMY      Allergies  Allergen Reactions   Carbamazepine      Decreased sodium level   Imipramine      Night sweats, more intense dreams   Metronidazole Nausea Only   Oxcarbazepine     Decreased sodium level   Penicillins Swelling    Yeast infection   Red Blood Cells Other (See Comments)    Jehovah's witness, does not want blood products   Statins     Gets hyponatremia and severe muscle pain   Sulfa Antibiotics Swelling    yeast infection    Outpatient Encounter Medications as of 11/10/2023  Medication Sig   aspirin  EC 81 MG tablet Take 1 tablet (81 mg total) by mouth daily. Swallow whole.   Biotin w/ Vitamins C & E (HAIR/SKIN/NAILS PO) Take 1 tablet by mouth daily.   Calcium Carb-Cholecalciferol (CALCIUM 500 + D PO) Take by mouth.   ezetimibe  (ZETIA ) 10 MG tablet Take 1 tablet (10 mg total) by mouth daily.   gabapentin  (NEURONTIN ) 300 MG capsule TAKE 1 CAPSULE BY MOUTH THREE TIMES DAILY   hydrALAZINE  (APRESOLINE ) 50 MG tablet Take 1 tablet (50 mg total) by mouth 2 (two) times daily.   lamoTRIgine  (LAMICTAL ) 150 MG tablet Take 2 tablets (300 mg total) by mouth 2 (two) times daily.   latanoprost  (XALATAN ) 0.005 % ophthalmic solution INSTILL 2 DROPS IN BOTH EYES AT BEDTIME   meclizine  (ANTIVERT ) 25 MG tablet TAKE 1 TABLET(25 MG) BY MOUTH DAILY AS NEEDED FOR DIZZINESS   Multiple Vitamin (MULTIVITAMIN) tablet Take 1 tablet by mouth daily.   potassium chloride  (KLOR-CON ) 10 MEQ tablet Take 1 tablet (10 mEq total) by mouth daily.   sertraline  (ZOLOFT ) 25 MG tablet Take 1 tablet (25 mg total) by mouth daily.   Suzetrigine 50 MG TABS Take 50 mg by mouth 2 (two) times daily.   timolol  (TIMOPTIC ) 0.5 % ophthalmic solution Place 1 drop into both eyes 2 (two) times daily.   [DISCONTINUED] baclofen  (LIORESAL ) 10 MG  tablet Take 0.5-1 tablets (5-10 mg total) by mouth 3 (three) times daily. (Patient not taking: Reported on 11/10/2023)   No facility-administered encounter medications on file as of 11/10/2023.    Review of Systems  Constitutional:  Negative for appetite change, chills, fatigue, fever and unexpected weight change.  HENT:  Positive for hearing loss. Negative for congestion, dental problem, ear discharge, ear pain, facial swelling, nosebleeds, postnasal drip, rhinorrhea, sinus pressure, sinus pain, sneezing, sore throat, tinnitus and trouble swallowing.   Eyes:  Negative for pain, discharge, redness, itching and visual disturbance.  Respiratory:  Negative for cough, chest tightness, shortness of breath and wheezing.   Cardiovascular:  Negative for chest pain, palpitations and leg swelling.  Gastrointestinal:  Negative for abdominal distention, abdominal pain, constipation, diarrhea, nausea and vomiting.  Endocrine: Negative for cold intolerance, heat intolerance, polydipsia, polyphagia  and polyuria.  Genitourinary:  Negative for difficulty urinating, dysuria, flank pain, frequency and urgency.  Musculoskeletal:  Positive for arthralgias. Negative for back pain, gait problem, joint swelling, myalgias, neck pain and neck stiffness.       Right shoulder pain   Skin:  Negative for color change, pallor, rash and wound.  Neurological:  Negative for syncope, speech difficulty, weakness, light-headedness, numbness and headaches.       Chronic dizziness   Hematological:  Does not bruise/bleed easily.  Psychiatric/Behavioral:  Negative for agitation, behavioral problems, confusion, hallucinations and sleep disturbance. The patient is not nervous/anxious.     Immunization History  Administered Date(s) Administered   Fluad Quad(high Dose 65+) 12/18/2020   Fluad Trivalent(High Dose 65+) 12/23/2022   INFLUENZA, HIGH DOSE SEASONAL PF 01/22/2017, 12/18/2018, 01/26/2020, 12/19/2021   Influenza Split  02/25/2013, 12/20/2013, 01/16/2016   Influenza,inj,Quad PF,6+ Mos 01/16/2016   Influenza,inj,quad, With Preservative 12/20/2013   Influenza-Unspecified 01/10/2010, 02/04/2011, 03/17/2012   Moderna Covid-19 Fall Seasonal Vaccine 54yrs & older 12/30/2022   PFIZER Comirnaty(Gray Top)Covid-19 Tri-Sucrose Vaccine 09/07/2020   PFIZER(Purple Top)SARS-COV-2 Vaccination 07/14/2019, 09/23/2019, 12/28/2019   Pneumococcal Conjugate-13 11/11/2016, 12/26/2021   Pneumococcal Polysaccharide-23 11/11/2011   Td 10/16/2010   Tdap 10/16/2010   Unspecified SARS-COV-2 Vaccination 12/26/2021   Pertinent  Health Maintenance Due  Topic Date Due   INFLUENZA VACCINE  10/17/2023   DEXA SCAN  Completed   Colonoscopy  Discontinued      02/10/2023   11:09 AM 04/30/2023    1:43 PM 07/14/2023    3:15 PM 09/08/2023    3:17 PM 11/10/2023    1:04 PM  Fall Risk  Falls in the past year? 0 0 0 1 1  Was there an injury with Fall? 0 0 0 1 1  Fall Risk Category Calculator 0 0 0 3 3  Patient at Risk for Falls Due to No Fall Risks  Impaired mobility History of fall(s);Impaired mobility Impaired balance/gait  Fall risk Follow up Falls evaluation completed;Education provided;Falls prevention discussed  Falls evaluation completed Falls evaluation completed;Education provided Falls evaluation completed   Functional Status Survey:    Vitals:   11/10/23 1312  BP: (!) 126/100  Pulse: 90  Resp: 16  Temp: (!) 97.4 F (36.3 C)  SpO2: 96%  Weight: 122 lb 12.8 oz (55.7 kg)  Height: 5' 3.5 (1.613 m)   Body mass index is 21.41 kg/m. Physical Exam  VITALS: BP- 118/100 GENERAL: Alert, cooperative, well developed, no acute distress HEENT: Normocephalic, normal oropharynx, moist mucous membranes CHEST: Clear to auscultation bilaterally, no wheezes, rhonchi, or crackles CARDIOVASCULAR: Normal heart rate and rhythm, S1 and S2 normal without murmurs ABDOMEN: Soft, non-tender, non-distended, without organomegaly, normal bowel  sounds EXTREMITIES: No cyanosis, edema, or swelling in legs. Right shoulder sling in place  NEUROLOGICAL: Cranial nerves grossly intact, moves all extremities without gross motor or sensory deficit SKIN: No rash,no lesion or erythema   PSYCHIATRY/BEHAVIORAL: Mood stable   Labs reviewed: Recent Labs    04/30/23 1528 08/01/23 1150 09/01/23 1727 09/01/23 1732  NA 140 137 139 137  K 3.9 3.9 3.0* 3.0*  CL 100 102 103 102  CO2 33* 29 25  --   GLUCOSE 90 80 109* 109*  BUN 19 13 9 9   CREATININE 0.84 0.89 0.78 0.80  CALCIUM 9.6 9.5 9.1  --   MG  --  2.2  --   --    Recent Labs    04/30/23 1528 09/01/23 1727  AST 15 22  ALT 12 13  ALKPHOS  --  93  BILITOT 0.3 0.8  PROT 6.7 6.6  ALBUMIN  --  3.6   Recent Labs    04/30/23 1528 09/01/23 1727 09/01/23 1732  WBC 6.1 5.7  --   NEUTROABS 3,867 3.5  --   HGB 13.4 13.2 13.6  HCT 41.3 40.3 40.0  MCV 96.3 94.4  --   PLT 259 241  --    Lab Results  Component Value Date   TSH 1.41 04/30/2023   Lab Results  Component Value Date   HGBA1C 5.4 01/16/2022   Lab Results  Component Value Date   CHOL 216 (H) 10/14/2022   HDL 115 10/14/2022   LDLCALC 87 10/14/2022   TRIG 54 10/14/2022   CHOLHDL 1.9 10/14/2022    Significant Diagnostic Results in last 30 days:  No results found.  Assessment/Plan  Recent right shoulder fracture Sustained from a fall on Wednesday morning. Currently wearing a sling as advised by the orthopedic specialist. Experiences pain in the shoulder and is taking Tylenol  for pain management. - Continue wearing the sling for three weeks as advised by the orthopedic specialist. - Take Tylenol  500 mg for pain management, one in the morning, one in the afternoon, and one at bedtime. - Follow up with the orthopedic specialist in two weeks.  Recurrent falls Recurrent falls throughout the year, with the most recent fall resulting in a right shoulder fracture. Falls may be related to chronic dizziness and  medication side effects, particularly gabapentin , which causes drowsiness. - Consider referral to physical therapy to assess balance and prevent falls. - Ensure use of walker at all times to prevent falls. - Discuss with insurance about coverage for a home health aide or sitter to assist with daily activities.  Chronic dizziness Dizziness may be related to medication side effects or inner ear issues. Experiences a sensation of losing balance and falling. - Continue taking dizziness medication Meclizine  25 mg in the morning as needed. - Consider referral to an ear, nose, and throat specialist to evaluate for inner ear causes of dizziness.  Multiple sclerosis, progressive type Diagnosed at age 72, now 50. Has the lowest progressive type, contributing to gait instability and balance issues. - Ensure consistent use of walker to aid in mobility and prevent falls.  Osteoporosis A concern, especially in the context of recent fractures. Awaiting an appointment with the orthopedic specialist for further management. - Follow up with the orthopedic specialist regarding osteoporosis management.  Hypertension Recent reading of 118/100 mmHg. Pain from the shoulder fracture may contribute to elevated blood pressure. Home readings are stable. - Continue taking hydralazine  50 mg twice daily. - Monitor blood pressure regularly at home.  Anxiety disorder Recent increase in anxiety symptoms. Currently taking Zoloft  25 mg daily. - Continue Zoloft  25 mg daily for anxiety management.  Code Status  - Request DNR form to be completed does not want to be resuscitated in case of cardiopulmonary event.MOST form discussed but would like to talk over with her HCPOA.Form completed and original copy given to patient.advised to place form on visible areas at home.   Family/ staff Communication: Reviewed plan of care with patient verbalized understanding   Labs/tests ordered:  -  CBC/diff - BMP with GFR  Next  Appointment: Return in about 4 months (around 03/11/2024) for medical mangement of chronic issue with PCP Harlene Caro PIETY .   Total time: 36 minutes. Greater than 50% of total time spent doing patient education regarding Dizziness,unsteady gait ,  Felton ellaree BURKITT, MS,Osteoporosis,right shoulder pain,Code Status ,health maintenance including symptom/medication management.   Roxan JAYSON Plough, NP

## 2023-11-11 ENCOUNTER — Ambulatory Visit: Payer: Self-pay | Admitting: Family

## 2023-11-11 LAB — CBC WITH DIFFERENTIAL/PLATELET
Absolute Lymphocytes: 1694 {cells}/uL (ref 850–3900)
Absolute Monocytes: 946 {cells}/uL (ref 200–950)
Basophils Absolute: 86 {cells}/uL (ref 0–200)
Basophils Relative: 1 %
Eosinophils Absolute: 112 {cells}/uL (ref 15–500)
Eosinophils Relative: 1.3 %
HCT: 38.1 % (ref 35.0–45.0)
Hemoglobin: 12.3 g/dL (ref 11.7–15.5)
MCH: 30.9 pg (ref 27.0–33.0)
MCHC: 32.3 g/dL (ref 32.0–36.0)
MCV: 95.7 fL (ref 80.0–100.0)
MPV: 10.2 fL (ref 7.5–12.5)
Monocytes Relative: 11 %
Neutro Abs: 5762 {cells}/uL (ref 1500–7800)
Neutrophils Relative %: 67 %
Platelets: 312 Thousand/uL (ref 140–400)
RBC: 3.98 Million/uL (ref 3.80–5.10)
RDW: 12.2 % (ref 11.0–15.0)
Total Lymphocyte: 19.7 %
WBC: 8.6 Thousand/uL (ref 3.8–10.8)

## 2023-11-12 ENCOUNTER — Telehealth: Payer: Self-pay | Admitting: Neurology

## 2023-11-12 NOTE — Telephone Encounter (Signed)
 Pt reports that she fell last Wednesday and broke her shoulder, she decided to get the sling for use and declined going anywhere but home.  Pt would like to know if Dr Vear can assist her in getting an aid of some kind, 24/7 care as a result of having MS, please call pt to discuss.

## 2023-11-13 NOTE — Telephone Encounter (Signed)
 Called pt at [phone]. LVM for pt to call

## 2023-11-13 NOTE — Telephone Encounter (Signed)
 Pt returned call. Pt will wait for nurse to call back.

## 2023-11-13 NOTE — Telephone Encounter (Signed)
 Called pt back. Saw PCP post fall and went to see Ortho. Placed in sling. No surgery needed. Recommended f/u in 3 wk with Ortho. She would like in home care aid. She states insurance will help cover home health aid she thinks. She will call insurance to see if there is a particular form that needs to be completed by provider. Provided fax# [phone].

## 2023-11-21 ENCOUNTER — Other Ambulatory Visit: Payer: Self-pay | Admitting: Nurse Practitioner

## 2023-11-21 DIAGNOSIS — F419 Anxiety disorder, unspecified: Secondary | ICD-10-CM

## 2023-11-21 DIAGNOSIS — F32A Depression, unspecified: Secondary | ICD-10-CM

## 2023-11-24 ENCOUNTER — Other Ambulatory Visit: Payer: Self-pay | Admitting: Nurse Practitioner

## 2023-11-24 DIAGNOSIS — R42 Dizziness and giddiness: Secondary | ICD-10-CM

## 2023-11-24 NOTE — Telephone Encounter (Signed)
 Copied from CRM [id number]. Topic: Clinical - Medication Refill >> Nov 24, 2023  2:26 PM Merlynn A wrote: Medication: meclizine  (ANTIVERT ) 25 MG tablet  Has the patient contacted their pharmacy? Yes (Agent: If no, request that the patient contact the pharmacy for the refill. If patient does not wish to contact the pharmacy document the reason why and proceed with request.) (Agent: If yes, when and what did the pharmacy advise?)  Patent was advised to contact provider.   This is the patient's preferred pharmacy:  [id number] - [place], [place] - [address] [place] [address] Phone: [phone] Fax: [phone]  Is this the correct pharmacy for this prescription? Yes If no, delete pharmacy and type the correct one.   Has the prescription been filled recently? No  Is the patient out of the medication? Yes  Has the patient been seen for an appointment in the last year OR does the patient have an upcoming appointment? Yes  Can we respond through MyChart? Yes  Agent: Please be advised that Rx refills may take up to 3 business days. We ask that you follow-up with your pharmacy.

## 2023-11-25 MED ORDER — MECLIZINE HCL 25 MG PO TABS: 25.0000 mg | ORAL_TABLET | Freq: Every day | ORAL | 5 refills | Status: AC | PRN

## 2023-11-28 DIAGNOSIS — S42201A Unspecified fracture of upper end of right humerus, initial encounter for closed fracture: Secondary | ICD-10-CM | POA: Diagnosis not present

## 2023-11-28 DIAGNOSIS — S42221A 2-part displaced fracture of surgical neck of right humerus, initial encounter for closed fracture: Secondary | ICD-10-CM | POA: Diagnosis not present

## 2023-11-28 DIAGNOSIS — S42209A Unspecified fracture of upper end of unspecified humerus, initial encounter for closed fracture: Secondary | ICD-10-CM | POA: Diagnosis not present

## 2023-12-01 ENCOUNTER — Telehealth: Payer: Self-pay | Admitting: Neurology

## 2023-12-01 NOTE — Telephone Encounter (Signed)
 Pt called to see if she can get medication to calm her down while she will be having another Dentist procedure . Pt is requesting Medication for this appt tomorrow 9/16 at 9:00 am

## 2023-12-01 NOTE — Telephone Encounter (Signed)
 Spoke with patient and informed her to ask if dentist can send in Rx or check with PCP regarding the below. Pt verbalized she understood.

## 2023-12-23 ENCOUNTER — Telehealth: Payer: Self-pay

## 2023-12-23 DIAGNOSIS — S42209D Unspecified fracture of upper end of unspecified humerus, subsequent encounter for fracture with routine healing: Secondary | ICD-10-CM | POA: Diagnosis not present

## 2023-12-23 DIAGNOSIS — S42201D Unspecified fracture of upper end of right humerus, subsequent encounter for fracture with routine healing: Secondary | ICD-10-CM | POA: Diagnosis not present

## 2023-12-23 NOTE — Telephone Encounter (Signed)
 Copied from CRM [id number]. Topic: Clinical - Medical Advice >> Dec 23, 2023  1:45 PM Graeme ORN wrote: Reason for CRM: Patient called. Previous seen and discussed weight loss with provider. Not getting better. Keeps losing weight. Would like to know what provider suggests. Thank You    ----------------------------------------------------------------------- From previous Reason for Contact - Scheduling: Patient/patient representative is calling to schedule an appointment. Refer to attachments for appointment information.

## 2023-12-23 NOTE — Telephone Encounter (Signed)
 Left a detailed message informing patient to call to schedule an appointment

## 2023-12-23 NOTE — Telephone Encounter (Signed)
 Please schedule in office appointment with PCP to discuss weight loss.

## 2023-12-24 DIAGNOSIS — M8000XA Age-related osteoporosis with current pathological fracture, unspecified site, initial encounter for fracture: Secondary | ICD-10-CM | POA: Diagnosis not present

## 2023-12-24 DIAGNOSIS — R296 Repeated falls: Secondary | ICD-10-CM | POA: Diagnosis not present

## 2023-12-24 DIAGNOSIS — M810 Age-related osteoporosis without current pathological fracture: Secondary | ICD-10-CM | POA: Diagnosis not present

## 2023-12-24 DIAGNOSIS — Z7189 Other specified counseling: Secondary | ICD-10-CM | POA: Diagnosis not present

## 2023-12-24 DIAGNOSIS — S42291D Other displaced fracture of upper end of right humerus, subsequent encounter for fracture with routine healing: Secondary | ICD-10-CM | POA: Diagnosis not present

## 2023-12-24 DIAGNOSIS — H8110 Benign paroxysmal vertigo, unspecified ear: Secondary | ICD-10-CM | POA: Diagnosis not present

## 2023-12-29 ENCOUNTER — Telehealth: Payer: Self-pay

## 2023-12-29 NOTE — Telephone Encounter (Signed)
 Called patient and no answer. Voicemail was left with office call back number.  She needs to schedule an appointment in office to discuss Bone Density as well as to address Weight loss. Message routed to admin staff incase she calls back.

## 2023-12-29 NOTE — Telephone Encounter (Signed)
 Seward Magnolia BROCKS, RN to [facility] (Selected Message)     12/29/23  2:46 PM Please see documentation note for patient request

## 2023-12-29 NOTE — Telephone Encounter (Signed)
 Copied from CRM [id number]. Topic: Clinical - Medical Advice >> Dec 29, 2023  2:44 PM Crystal Huber wrote: Reason for CRM: Patient recently had a bone density test done and she states the results were low. She was advised to take calcium but is scared of the side effects. Would like to speak to her PCP or a nurse to see if its okay for her to be taking calcium medication

## 2023-12-30 DIAGNOSIS — G500 Trigeminal neuralgia: Secondary | ICD-10-CM | POA: Diagnosis not present

## 2023-12-30 DIAGNOSIS — Z923 Personal history of irradiation: Secondary | ICD-10-CM | POA: Diagnosis not present

## 2023-12-30 DIAGNOSIS — M810 Age-related osteoporosis without current pathological fracture: Secondary | ICD-10-CM | POA: Diagnosis not present

## 2024-01-05 ENCOUNTER — Telehealth: Payer: Self-pay | Admitting: Neurology

## 2024-01-05 MED ORDER — GABAPENTIN 300 MG PO CAPS: 300.0000 mg | ORAL_CAPSULE | Freq: Three times a day (TID) | ORAL | 2 refills | Status: AC

## 2024-01-05 MED ORDER — LAMOTRIGINE 150 MG PO TABS: ORAL_TABLET | ORAL | 1 refills | Status: AC

## 2024-01-05 NOTE — Telephone Encounter (Signed)
 Patient request refills for gabapentin  (NEURONTIN ) 300 MG capsule and lamoTRIgine  (LAMICTAL ) 150 MG tablet send to [facility] [phone]

## 2024-01-05 NOTE — Telephone Encounter (Signed)
 Last seen on 09/08/23 Follow up scheduled on 04/07/24  See telephone note 10/30/23   Rx refilled

## 2024-01-08 ENCOUNTER — Telehealth: Payer: Self-pay

## 2024-01-08 NOTE — Telephone Encounter (Signed)
 Patient stated her neurologist in [place] has recommend [place], she would like to know if Harlene is ok with this recommendation, and questions if that would interfere with any of her current medications. Patient stated the neurologist also recommend Vitafusion and she would like to know if Harlene agrees with that

## 2024-01-08 NOTE — Telephone Encounter (Signed)
 Left voicemail for patient to return call to office. I need to know that name of the injection that she is receiving and what type of vitamin is taking (gummy, iron, etc).  Please provide message from provider/office when call is returned from patient.

## 2024-01-08 NOTE — Telephone Encounter (Signed)
 Copied from CRM #[id number]. Topic: Clinical - Medication Question >> Jan 08, 2024 12:20 PM Crystal Huber wrote: Reason for CRM: Patient is calling in asking if the injection to help her calcium and the vita fusion gummy due to lack of bone density is both okay to take with her current medications she is already on. Please advise the patient.

## 2024-01-08 NOTE — Telephone Encounter (Signed)
 This is okay, is the neurologist planning on prescribing the [place]?

## 2024-01-08 NOTE — Telephone Encounter (Signed)
 Yes!  I called patient again and left a detailed message with providers response

## 2024-02-05 ENCOUNTER — Other Ambulatory Visit: Payer: Self-pay | Admitting: Nurse Practitioner

## 2024-02-05 DIAGNOSIS — E7800 Pure hypercholesterolemia, unspecified: Secondary | ICD-10-CM

## 2024-02-16 ENCOUNTER — Telehealth: Payer: Self-pay | Admitting: *Deleted

## 2024-02-16 ENCOUNTER — Encounter: Payer: Medicare Other | Admitting: Nurse Practitioner

## 2024-02-16 NOTE — Telephone Encounter (Signed)
 Called and spoke with patient. Offered an appointment for today 12/1 and patient refused. Appointment scheduled for 12/2 for evaluation.

## 2024-02-16 NOTE — Telephone Encounter (Signed)
 Copied from CRM #[id number]. Topic: Clinical - Medical Advice >> Feb 16, 2024  9:18 AM Diannia H wrote: Reason for CRM: Patient is wanting to let the provider know she has some tingling in her face and feet on and off and she is wanting to know if there is anything going on with her. She thinks its nothing but wants to ask some additional questions. Could you assist? Callback number is [phone]. She is taking two new vitamins and a higher dose of Vitamin D but that's the only changes she has had.

## 2024-02-17 ENCOUNTER — Ambulatory Visit: Admitting: Adult Health

## 2024-02-17 ENCOUNTER — Encounter: Payer: Self-pay | Admitting: Adult Health

## 2024-02-17 VITALS — BP 118/64 | HR 73 | Temp 97.0°F | Ht 63.5 in | Wt 124.8 lb

## 2024-02-17 DIAGNOSIS — F419 Anxiety disorder, unspecified: Secondary | ICD-10-CM | POA: Diagnosis not present

## 2024-02-17 DIAGNOSIS — I10 Essential (primary) hypertension: Secondary | ICD-10-CM | POA: Diagnosis not present

## 2024-02-17 DIAGNOSIS — G500 Trigeminal neuralgia: Secondary | ICD-10-CM | POA: Diagnosis not present

## 2024-02-17 DIAGNOSIS — G629 Polyneuropathy, unspecified: Secondary | ICD-10-CM | POA: Diagnosis not present

## 2024-02-17 DIAGNOSIS — G35D Multiple sclerosis, unspecified: Secondary | ICD-10-CM

## 2024-02-17 NOTE — Patient Instructions (Signed)
 Schedule Medicare Annual Wellness at checkout

## 2024-02-17 NOTE — Progress Notes (Signed)
 [facility] clinic  Provider:  Jereld Serum DNP  Code Status:  Full Code  Goals of Care:     11/10/2023    1:05 PM  Advanced Directives  Does Patient Have a Medical Advance Directive? Yes  Type of Estate Agent of [place];Living will;Out of facility DNR (pink MOST or yellow form)  Does patient want to make changes to medical advance directive? No - Patient declined  Copy of Healthcare Power of Attorney in Chart? Yes - validated most recent copy scanned in chart (See row information)     Chief Complaint  Patient presents with   Numbness    Feet and sometime face. On and off. Mostly in her feet    Discussed the use of AI scribe software for clinical note transcription with the patient, who gave verbal consent to proceed.  HPI: Patient is a 78 y.o. female seen today for an acute visit numbness/tingling of her feet.  She experiences tingling sensations primarily in her feet and occasionally in her hands, described as a numb, 'like I've slept on it' feeling. This occurs mostly in the evenings, at night, and sometimes in the mornings, and is more pronounced when she is sitting or inactive. The sensation is not painful and does not disturb her sleep.  Her medical history includes multiple sclerosis diagnosed 22 years ago, leading to cognitive issues and unsteadiness. She experienced a fall two months ago resulting in a shoulder fracture, which is currently healing. She does not take any medication specifically for MS. She has trigeminal neuralgia for which she takes gabapentin  300 mg three times a day and lamotrigine  150 mg in the morning, 150 mg in the afternoon, and 150 mg at bedtime.   She has hypertension, managed with hydralazine  50 mg twice a day, and her blood pressure is well-controlled. She also has a history of anxiety, for which she takes Zoloft  25 mg daily. She experiences some swelling in her ankles and has vertigo, for which she takes meclizine .  She  lives alone following the death of her husband and has an aide who assists her twice a week. She is active, using a cane for mobility, and is conscious about maintaining a healthy diet and weight.       Past Medical History:  Diagnosis Date   Arthritis    right pinkie   Cancer (HCC)    right BR  CA    Cataract    right eye   GERD (gastroesophageal reflux disease)    prn tums, mild not often   Glaucoma    Hyperlipidemia    Hypertension    Macrocytic anemia 09/12/2013   Multiple sclerosis    Neuromuscular disorder (HCC)    trigeminal neuralgia   Refusal of blood product    Trigeminal neuralgia of left side of face     Past Surgical History:  Procedure Laterality Date   BREAST LUMPECTOMY Right    wears prosthesis   CATARACT EXTRACTION Left 05/2022   COLONOSCOPY  12/20/2010   ganglion cyst removal Bilateral    NERVE SURGERY  05/2022   PARTIAL HYSTERECTOMY     POLYPECTOMY      Allergies  Allergen Reactions   Carbamazepine      Decreased sodium level   Imipramine      Night sweats, more intense dreams   Metronidazole Nausea Only   Oxcarbazepine     Decreased sodium level   Penicillins Swelling    Yeast infection   Red Blood Cells Other (  See Comments)    Jehovah's witness, does not want blood products   Statins     Gets hyponatremia and severe muscle pain   Sulfa Antibiotics Swelling    yeast infection    Outpatient Encounter Medications as of 02/17/2024  Medication Sig   aspirin  EC 81 MG tablet Take 1 tablet (81 mg total) by mouth daily. Swallow whole.   Biotin w/ Vitamins C & E (HAIR/SKIN/NAILS PO) Take 1 tablet by mouth daily.   Calcium Carb-Cholecalciferol (CALCIUM 500 + D PO) Take by mouth.   ezetimibe  (ZETIA ) 10 MG tablet TAKE 1 TABLET(10 MG) BY MOUTH DAILY   gabapentin  (NEURONTIN ) 300 MG capsule Take 1 capsule (300 mg total) by mouth 3 (three) times daily.   hydrALAZINE  (APRESOLINE ) 50 MG tablet TAKE 1 TABLET(50 MG) BY MOUTH TWICE DAILY   lamoTRIgine   (LAMICTAL ) 150 MG tablet Take 1 tablet in the morning, 1 tablet in the afternoon and 2 tablets in the evening.   latanoprost  (XALATAN ) 0.005 % ophthalmic solution INSTILL 2 DROPS IN BOTH EYES AT BEDTIME   meclizine  (ANTIVERT ) 25 MG tablet Take 1 tablet (25 mg total) by mouth daily as needed for dizziness. (Patient taking differently: Take 25 mg by mouth daily.)   Multiple Vitamin (MULTIVITAMIN) tablet Take 1 tablet by mouth daily.   potassium chloride  (KLOR-CON ) 10 MEQ tablet Take 1 tablet (10 mEq total) by mouth daily.   sertraline  (ZOLOFT ) 25 MG tablet TAKE 1 TABLET(25 MG) BY MOUTH DAILY   Suzetrigine 50 MG TABS Take 50 mg by mouth 2 (two) times daily.   timolol  (TIMOPTIC ) 0.5 % ophthalmic solution Place 1 drop into both eyes 2 (two) times daily.   No facility-administered encounter medications on file as of 02/17/2024.    Review of Systems:  Review of Systems  Constitutional:  Negative for appetite change, chills, fatigue and fever.  HENT:  Negative for congestion, hearing loss, rhinorrhea and sore throat.   Eyes: Negative.   Respiratory:  Negative for cough, shortness of breath and wheezing.   Cardiovascular:  Negative for chest pain, palpitations and leg swelling.  Gastrointestinal:  Negative for abdominal pain, constipation, diarrhea, nausea and vomiting.  Genitourinary:  Negative for dysuria.  Musculoskeletal:  Negative for arthralgias, back pain and myalgias.  Skin:  Negative for color change, rash and wound.  Neurological:  Negative for dizziness, weakness and headaches.       Tingling of feet, off and on during the day  Psychiatric/Behavioral:  Negative for behavioral problems. The patient is not nervous/anxious.     Health Maintenance  Topic Date Due   Zoster Vaccines- Shingrix (1 of 2) Never done   DTaP/Tdap/Td (3 - Td or Tdap) 10/15/2020   Influenza Vaccine  10/17/2023   Medicare Annual Wellness (AWV)  02/10/2024   COVID-19 Vaccine (7 - 2025-26 season) 03/04/2024  (Originally 11/17/2023)   Pneumococcal Vaccine: 50+ Years  Completed   Bone Density Scan  Completed   Hepatitis C Screening  Completed   Meningococcal B Vaccine  Aged Out   Colonoscopy  Discontinued    Physical Exam: Vitals:   02/17/24 1046  BP: 118/64  Pulse: 73  Temp: (!) 97 F (36.1 C)  TempSrc: Temporal  SpO2: 96%  Weight: 124 lb 12.8 oz (56.6 kg)  Height: 5' 3.5 (1.613 m)   Body mass index is 21.76 kg/m. Physical Exam Constitutional:      Appearance: Normal appearance.  HENT:     Head: Normocephalic and atraumatic.     Nose:  Nose normal.     Mouth/Throat:     Mouth: Mucous membranes are moist.  Eyes:     Conjunctiva/sclera: Conjunctivae normal.  Cardiovascular:     Rate and Rhythm: Normal rate and regular rhythm.  Pulmonary:     Effort: Pulmonary effort is normal.     Breath sounds: Normal breath sounds.  Abdominal:     General: Bowel sounds are normal.     Palpations: Abdomen is soft.  Musculoskeletal:        General: Normal range of motion.     Cervical back: Normal range of motion.  Skin:    General: Skin is warm and dry.  Neurological:     General: No focal deficit present.     Mental Status: She is alert and oriented to person, place, and time.  Psychiatric:        Mood and Affect: Mood normal.        Behavior: Behavior normal.        Thought Content: Thought content normal.        Judgment: Judgment normal.     Labs reviewed: Basic Metabolic Panel: Recent Labs    04/30/23 1528 08/01/23 1150 09/01/23 1727 09/01/23 1732  NA 140 137 139 137  K 3.9 3.9 3.0* 3.0*  CL 100 102 103 102  CO2 33* 29 25  --   GLUCOSE 90 80 109* 109*  BUN 19 13 9 9   CREATININE 0.84 0.89 0.78 0.80  CALCIUM 9.6 9.5 9.1  --   MG  --  2.2  --   --   TSH 1.41  --   --   --    Liver Function Tests: Recent Labs    04/30/23 1528 09/01/23 1727  AST 15 22  ALT 12 13  ALKPHOS  --  93  BILITOT 0.3 0.8  PROT 6.7 6.6  ALBUMIN  --  3.6   No results for input(s):  LIPASE, AMYLASE in the last 8760 hours. No results for input(s): AMMONIA in the last 8760 hours. CBC: Recent Labs    04/30/23 1528 09/01/23 1727 09/01/23 1732 11/10/23 1405  WBC 6.1 5.7  --  8.6  NEUTROABS 3,867 3.5  --  5,762  HGB 13.4 13.2 13.6 12.3  HCT 41.3 40.3 40.0 38.1  MCV 96.3 94.4  --  95.7  PLT 259 241  --  312   Lipid Panel: No results for input(s): CHOL, HDL, LDLCALC, TRIG, CHOLHDL, LDLDIRECT in the last 8760 hours. Lab Results  Component Value Date   HGBA1C 5.4 01/16/2022    Procedures since last visit: No results found.  Assessment/Plan  1. Neuropathy (Primary) -  Intermittent tingling in extremities suggests neuropathy. Differential includes vitamin B12 deficiency. - Ordered vitamin B12 level. - Continue gabapentin  and lamotrigine . - Vitamin B12  2. Trigeminal neuralgia -  Chronic condition managed with gabapentin  and lamotrigine . - Continue gabapentin  and lamotrigine .  3. Primary hypertension -  Well controlled with hydralazine . Current BP 118/64 mmHg. - Continue hydralazine  50 mg twice daily.  4. Anxiety -  Managed with Zoloft . - Continue Zoloft  25 mg daily.  5. Multiple sclerosis -  Slow progressive type with cognitive issues and unsteadiness. - Encouraged regular exercise and healthy diet.       Labs/tests ordered:   Vitamin B12 level   Return if symptoms worsen or fail to improve.  Vestal Markin Medina-Vargas, NP

## 2024-02-18 ENCOUNTER — Ambulatory Visit: Payer: Self-pay | Admitting: Adult Health

## 2024-02-18 LAB — VITAMIN B12: Vitamin B-12: >2000 pg/mL — ABNORMAL HIGH (ref 200–1100)

## 2024-02-18 NOTE — Progress Notes (Signed)
-    Vitamin B12 is elevated, pls follow up with neurology for your multiple sclerosis/neuropathy on  your feet

## 2024-02-19 ENCOUNTER — Telehealth: Payer: Self-pay | Admitting: Neurology

## 2024-02-19 NOTE — Telephone Encounter (Signed)
 Pt reports that for about a week or two she has not had any pain in her face, she would like a call to discuss how to taper off medications, please call.

## 2024-02-20 NOTE — Telephone Encounter (Signed)
 Lvm 1st attempt by hf 02/20/24

## 2024-02-20 NOTE — Telephone Encounter (Signed)
 Pt has returned call to CMA, she is asking for a call back. Pt aware the call may come from a private number

## 2024-02-20 NOTE — Telephone Encounter (Signed)
 Called and spoke to pt and relayed the following: can reduce the lamotrigine  to 1 pill twice a day and reduce the gabapentin  to 1 pill twice a day. If she continues to do well over the next month or so we could consider reducing the medication further   Pt voiced gratitude understanding and gratitude for the call.

## 2024-02-20 NOTE — Progress Notes (Signed)
 How much Vitamin B12 do you take? I recommend Vitamin B12 to be cut down, like take it only 3X/week.

## 2024-02-23 ENCOUNTER — Telehealth: Payer: Self-pay | Admitting: *Deleted

## 2024-02-23 DIAGNOSIS — G629 Polyneuropathy, unspecified: Secondary | ICD-10-CM

## 2024-02-23 NOTE — Telephone Encounter (Signed)
 FYI: Patient said, neuropathy in feet diagnosed by PCP Harlene An. Wanting Dr. Vear treat for neuropathy. Informed patient would need a referral from Dr. An for new symptoms

## 2024-02-23 NOTE — Telephone Encounter (Signed)
 Referral placed.

## 2024-02-23 NOTE — Telephone Encounter (Unsigned)
 Copied from CRM #[id number]. Topic: Referral - Question >> Feb 23, 2024  2:43 PM DeAngela L wrote: Reason for RMF:Ejupzwu is calling to ask can the provider send over a referral to the Neuropathy office for the her after neuropathy was discovered after her blood work  Dr Charlie Crete  Address: [address], [place], [place] [address] Phone: [phone]  Patient ask to Please Also add this doctor to the list of doctor who needs to be informed of patient concerns

## 2024-02-28 ENCOUNTER — Other Ambulatory Visit: Payer: Self-pay | Admitting: Nurse Practitioner

## 2024-02-28 DIAGNOSIS — F419 Anxiety disorder, unspecified: Secondary | ICD-10-CM

## 2024-02-28 DIAGNOSIS — F32A Depression, unspecified: Secondary | ICD-10-CM

## 2024-03-09 NOTE — Telephone Encounter (Addendum)
 Patient said taking gabapentin  1 tablet am and 1 tablet pm and lamotrigine  1 tablet am, 1 tablet pm. Have been doing well over a week,. Want to know if time get off one the medication or decrease again

## 2024-03-15 ENCOUNTER — Encounter: Payer: Self-pay | Admitting: Nurse Practitioner

## 2024-03-15 ENCOUNTER — Ambulatory Visit: Payer: Self-pay | Admitting: Nurse Practitioner

## 2024-03-15 VITALS — HR 80 | Temp 97.6°F | Ht 63.5 in | Wt 125.6 lb

## 2024-03-15 DIAGNOSIS — F32A Depression, unspecified: Secondary | ICD-10-CM

## 2024-03-15 DIAGNOSIS — G500 Trigeminal neuralgia: Secondary | ICD-10-CM

## 2024-03-15 DIAGNOSIS — G35D Multiple sclerosis, unspecified: Secondary | ICD-10-CM | POA: Diagnosis not present

## 2024-03-15 DIAGNOSIS — G629 Polyneuropathy, unspecified: Secondary | ICD-10-CM

## 2024-03-15 DIAGNOSIS — M810 Age-related osteoporosis without current pathological fracture: Secondary | ICD-10-CM

## 2024-03-15 DIAGNOSIS — I10 Essential (primary) hypertension: Secondary | ICD-10-CM

## 2024-03-15 DIAGNOSIS — E7800 Pure hypercholesterolemia, unspecified: Secondary | ICD-10-CM | POA: Diagnosis not present

## 2024-03-15 NOTE — Patient Instructions (Addendum)
 Schedule Medicare annual wellness.  Due for shingles, tetanus, and flu shot. Will obtain from local pharmacy.

## 2024-03-15 NOTE — Telephone Encounter (Signed)
 I called the patient and she is not having issues witht the gabapentin  or lamictal . Patient stated she took her last treatment las week and wanted to see if Dr. Vear wanted to continue to ween her down from her current dose.   - Patient plans to keep 1/21/ 26 appointment, but just wanted to see about medication use prior to her appointment.

## 2024-03-15 NOTE — Progress Notes (Signed)
 "   Careteam: Patient Care Team: Caro Harlene POUR, NP as PCP - General (Geriatric Medicine)  PLACE OF SERVICE:  [facility]  Advanced Directive information    Allergies[1]  Chief Complaint  Patient presents with   medication management of chronic issues    Patient presents today for routine 4 month follow up. Care gaps addressed today.     HPI:  Discussed the use of AI scribe software for clinical note transcription with the patient, who gave verbal consent to proceed.  History of Present Illness Crystal Huber is a 78 year old female who presents for a four-month follow-up.  She is currently receiving Evenity injections monthly for osteoporosis   She reports she plans to takes Tums twice a day for calcium supplementation due to difficulty swallowing large pills.  She is managing multiple sclerosis and is in the process of weaning off gabapentin  and lamotrigine , previously used for trigeminal neuralgia on the left side. She reports no current facial pain but does experience some facial numbness.  She takes Zetia  for cholesterol management   Talking Hydralazine  50 mg twice daily for blood pressure control.   She takes Zoloft , reduced to 25 mg, and reports occasional sadness and anxiety but overall mood is controlled.   She experiences mild neuropathy symptoms, including tingling and restless legs. She engages in exercises to improve circulation and has a family history of neuropathy.  She uses a walker at home and wears a Lifeguard device for safety. No recent falls have occurred, and she is cautious to prevent accidents. A helper assists with household tasks, and she avoids showering alone to prevent falls.  Review of Systems:  Review of Systems  Constitutional:  Negative for chills, fever and weight loss.  HENT:  Negative for tinnitus.   Respiratory:  Negative for cough, sputum production and shortness of breath.   Cardiovascular:  Negative for chest pain, palpitations  and leg swelling.  Gastrointestinal:  Negative for abdominal pain, constipation, diarrhea and heartburn.  Genitourinary:  Negative for dysuria, frequency and urgency.  Musculoskeletal:  Negative for back pain, falls, joint pain and myalgias.  Skin: Negative.   Neurological:  Positive for sensory change. Negative for dizziness and headaches.  Psychiatric/Behavioral:  Negative for depression and memory loss. The patient does not have insomnia.     Past Medical History:  Diagnosis Date   Arthritis    right pinkie   Cancer (HCC)    right BR  CA    Cataract    right eye   GERD (gastroesophageal reflux disease)    prn tums, mild not often   Glaucoma    Hyperlipidemia    Hypertension    Macrocytic anemia 09/12/2013   Multiple sclerosis    Neuromuscular disorder (HCC)    trigeminal neuralgia   Refusal of blood product    Trigeminal neuralgia of left side of face    Past Surgical History:  Procedure Laterality Date   BREAST LUMPECTOMY Right    wears prosthesis   CATARACT EXTRACTION Left 05/2022   COLONOSCOPY  12/20/2010   ganglion cyst removal Bilateral    NERVE SURGERY  05/2022   PARTIAL HYSTERECTOMY     POLYPECTOMY     Social History:   reports that she has quit smoking. Her smoking use included cigarettes. She has never used smokeless tobacco. She reports that she does not currently use alcohol . She reports that she does not use drugs.  Family History  Problem Relation Age of Onset  Congestive Heart Failure Mother    Heart disease Father    Heart attack Father    Stroke Sister    Breast cancer Sister    Heart disease Sister    Heart disease Brother    Heart disease Brother    Heart disease Brother    Colon cancer Neg Hx    Colon polyps Neg Hx    Esophageal cancer Neg Hx    Rectal cancer Neg Hx    Stomach cancer Neg Hx     Medications: Patient's Medications  New Prescriptions   No medications on file  Previous Medications   ASPIRIN  EC 81 MG TABLET    Take 1  tablet (81 mg total) by mouth daily. Swallow whole.   BIOTIN W/ VITAMINS C & E (HAIR/SKIN/NAILS PO)    Take 1 tablet by mouth daily.   CALCIUM CARB-CHOLECALCIFEROL (CALCIUM 500 + D PO)    Take by mouth.   CALCIUM CARBONATE (TUMS SMOOTHIES) 750 MG CHEWABLE TABLET    Chew 1,500 mg by mouth daily.   EZETIMIBE  (ZETIA ) 10 MG TABLET    TAKE 1 TABLET(10 MG) BY MOUTH DAILY   GABAPENTIN  (NEURONTIN ) 300 MG CAPSULE    Take 1 capsule (300 mg total) by mouth 3 (three) times daily.   HYDRALAZINE  (APRESOLINE ) 50 MG TABLET    TAKE 1 TABLET(50 MG) BY MOUTH TWICE DAILY   LAMOTRIGINE  (LAMICTAL ) 150 MG TABLET    Take 1 tablet in the morning, 1 tablet in the afternoon and 2 tablets in the evening.   LATANOPROST  (XALATAN ) 0.005 % OPHTHALMIC SOLUTION    INSTILL 2 DROPS IN BOTH EYES AT BEDTIME   MECLIZINE  (ANTIVERT ) 25 MG TABLET    Take 1 tablet (25 mg total) by mouth daily as needed for dizziness.   MULTIPLE VITAMIN (MULTIVITAMIN) TABLET    Take 1 tablet by mouth daily.   POTASSIUM CHLORIDE  (KLOR-CON ) 10 MEQ TABLET    Take 1 tablet (10 mEq total) by mouth daily.   SALICYLIC ACID (GNP CORN REMOVERS) 40 % PADS    1 each by Other route as needed.   SERTRALINE  (ZOLOFT ) 25 MG TABLET    TAKE 1 TABLET(25 MG) BY MOUTH DAILY   SUZETRIGINE 50 MG TABS    Take 50 mg by mouth 2 (two) times daily.   TIMOLOL  (TIMOPTIC ) 0.5 % OPHTHALMIC SOLUTION    Place 1 drop into both eyes 2 (two) times daily.  Modified Medications   No medications on file  Discontinued Medications   No medications on file    Physical Exam:  Vitals:   03/15/24 1144  Pulse: 80  Temp: 97.6 F (36.4 C)  SpO2: 96%  Weight: 125 lb 9.6 oz (57 kg)  Height: 5' 3.5 (1.613 m)   Body mass index is 21.9 kg/m. Wt Readings from Last 3 Encounters:  03/15/24 125 lb 9.6 oz (57 kg)  02/17/24 124 lb 12.8 oz (56.6 kg)  11/10/23 122 lb 12.8 oz (55.7 kg)    Physical Exam Constitutional:      General: She is not in acute distress.    Appearance: She is  well-developed. She is not diaphoretic.  HENT:     Head: Normocephalic and atraumatic.     Mouth/Throat:     Pharynx: No oropharyngeal exudate.  Eyes:     Conjunctiva/sclera: Conjunctivae normal.     Pupils: Pupils are equal, round, and reactive to light.  Cardiovascular:     Rate and Rhythm: Normal rate and regular rhythm.  Heart sounds: Normal heart sounds.  Pulmonary:     Effort: Pulmonary effort is normal.     Breath sounds: Normal breath sounds.  Abdominal:     General: Bowel sounds are normal.     Palpations: Abdomen is soft.  Musculoskeletal:     Cervical back: Normal range of motion and neck supple.     Right lower leg: No edema.     Left lower leg: No edema.  Skin:    General: Skin is warm and dry.  Neurological:     Mental Status: She is alert.  Psychiatric:        Mood and Affect: Mood normal.     Labs reviewed: Basic Metabolic Panel: Recent Labs    04/30/23 1528 08/01/23 1150 09/01/23 1727 09/01/23 1732  NA 140 137 139 137  K 3.9 3.9 3.0* 3.0*  CL 100 102 103 102  CO2 33* 29 25  --   GLUCOSE 90 80 109* 109*  BUN 19 13 9 9   CREATININE 0.84 0.89 0.78 0.80  CALCIUM 9.6 9.5 9.1  --   MG  --  2.2  --   --   TSH 1.41  --   --   --    Liver Function Tests: Recent Labs    04/30/23 1528 09/01/23 1727  AST 15 22  ALT 12 13  ALKPHOS  --  93  BILITOT 0.3 0.8  PROT 6.7 6.6  ALBUMIN  --  3.6   No results for input(s): LIPASE, AMYLASE in the last 8760 hours. No results for input(s): AMMONIA in the last 8760 hours. CBC: Recent Labs    04/30/23 1528 09/01/23 1727 09/01/23 1732 11/10/23 1405  WBC 6.1 5.7  --  8.6  NEUTROABS 3,867 3.5  --  5,762  HGB 13.4 13.2 13.6 12.3  HCT 41.3 40.3 40.0 38.1  MCV 96.3 94.4  --  95.7  PLT 259 241  --  312   Lipid Panel: No results for input(s): CHOL, HDL, LDLCALC, TRIG, CHOLHDL, LDLDIRECT in the last 8760 hours. TSH: Recent Labs    04/30/23 1528  TSH 1.41   A1C: Lab Results   Component Value Date   HGBA1C 5.4 01/16/2022     Assessment/Plan  Assessment & Plan Osteoporosis Managed with Evenity and adjusted calcium supplementation due to swallowing difficulty. - Continue Evenity injections monthly. - Adjust calcium supplementation to 600 mg twice daily using Tums.  Multiple sclerosis Multiple sclerosis managed by neurologist.    Trigeminal neuralgia managed with gabapentin  and lamotrigine  reduction. No facial pain reported. - Continue weaning off gabapentin  and lamotrigine  as per neurologist's guidance. - Follow up with neurologist on January 21st.  Depression and anxiety Improvement in mood and anxiety with Zoloft  25 mg. - Continue Zoloft  25 mg daily.  Primary hypertension Well-controlled with hydralazine . Blood pressure readings normal. - Continue hydralazine  50 mg twice daily.  Hypercholesterolemia Managed with Zetia . - Continue Zetia  as prescribed.  Polyneuropathy Mild symptoms managed with exercise. Facial numbness possibly related to medication. - Continue exercise for circulation. - Discuss facial numbness with neurologist.   Return in about 4 months (around 07/14/2024) for routine follow up.:   Finneas Mathe K. Caro BODILY [facility] [phone]      [1]  Allergies Allergen Reactions   Carbamazepine      Decreased sodium level   Imipramine      Night sweats, more intense dreams   Metronidazole Nausea Only   Oxcarbazepine     Decreased sodium  level   Penicillins Swelling    Yeast infection   Red Blood Cells Other (See Comments)    Jehovah's witness, does not want blood products   Statins     Gets hyponatremia and severe muscle pain   Sulfa Antibiotics Swelling    yeast infection   "

## 2024-03-15 NOTE — Telephone Encounter (Signed)
 Pt called to follow up with With about if she should still take 2 of her medication each day . Pt stated she want to stop taking these medication. Pt also mention she is willing to change medication is possible   gabapentin  (NEURONTIN ) 300 MG capsule  lamoTRIgine  (LAMICTAL ) 150 MG tablet

## 2024-03-16 ENCOUNTER — Telehealth: Payer: Self-pay

## 2024-03-16 ENCOUNTER — Ambulatory Visit: Payer: Self-pay | Admitting: Nurse Practitioner

## 2024-03-16 LAB — COMPREHENSIVE METABOLIC PANEL WITH GFR
AG Ratio: 1.6 (calc) (ref 1.0–2.5)
ALT: 13 U/L (ref 6–29)
AST: 14 U/L (ref 10–35)
Albumin: 4.1 g/dL (ref 3.6–5.1)
Alkaline phosphatase (APISO): 137 U/L (ref 37–153)
BUN: 20 mg/dL (ref 7–25)
CO2: 29 mmol/L (ref 20–32)
Calcium: 9.5 mg/dL (ref 8.6–10.4)
Chloride: 105 mmol/L (ref 98–110)
Creat: 0.72 mg/dL (ref 0.60–1.00)
Globulin: 2.5 g/dL (ref 1.9–3.7)
Glucose, Bld: 68 mg/dL (ref 65–139)
Potassium: 4.2 mmol/L (ref 3.5–5.3)
Sodium: 141 mmol/L (ref 135–146)
Total Bilirubin: 0.3 mg/dL (ref 0.2–1.2)
Total Protein: 6.6 g/dL (ref 6.1–8.1)
eGFR: 86 mL/min/1.73m2

## 2024-03-16 LAB — CBC WITH DIFFERENTIAL/PLATELET
Absolute Lymphocytes: 1357 {cells}/uL (ref 850–3900)
Absolute Monocytes: 588 {cells}/uL (ref 200–950)
Basophils Absolute: 58 {cells}/uL (ref 0–200)
Basophils Relative: 1.1 %
Eosinophils Absolute: 90 {cells}/uL (ref 15–500)
Eosinophils Relative: 1.7 %
HCT: 40.8 % (ref 35.9–46.0)
Hemoglobin: 13.1 g/dL (ref 11.7–15.5)
MCH: 29.8 pg (ref 27.0–33.0)
MCHC: 32.1 g/dL (ref 31.6–35.4)
MCV: 92.9 fL (ref 81.4–101.7)
MPV: 10.7 fL (ref 7.5–12.5)
Monocytes Relative: 11.1 %
Neutro Abs: 3207 {cells}/uL (ref 1500–7800)
Neutrophils Relative %: 60.5 %
Platelets: 260 Thousand/uL (ref 140–400)
RBC: 4.39 Million/uL (ref 3.80–5.10)
RDW: 12.6 % (ref 11.0–15.0)
Total Lymphocyte: 25.6 %
WBC: 5.3 Thousand/uL (ref 3.8–10.8)

## 2024-03-16 LAB — LIPID PANEL
Cholesterol: 229 mg/dL — ABNORMAL HIGH
HDL: 95 mg/dL
LDL Cholesterol (Calc): 113 mg/dL — ABNORMAL HIGH
Non-HDL Cholesterol (Calc): 134 mg/dL — ABNORMAL HIGH
Total CHOL/HDL Ratio: 2.4 (calc)
Triglycerides: 100 mg/dL

## 2024-03-16 NOTE — Telephone Encounter (Signed)
 Patient has been notified

## 2024-03-16 NOTE — Telephone Encounter (Signed)
 Copied from CRM #[id number]. Topic: Clinical - Lab/Test Results >> Mar 16, 2024 12:04 PM Miquel SAILOR wrote: Reason for CRM: PT returning call. Called and transferred to Darice

## 2024-04-07 ENCOUNTER — Encounter: Payer: Self-pay | Admitting: Neurology

## 2024-04-07 ENCOUNTER — Ambulatory Visit: Admitting: Neurology

## 2024-04-07 VITALS — BP 137/79 | HR 64 | Ht 63.0 in | Wt 127.0 lb

## 2024-04-07 DIAGNOSIS — G35C1 Active secondary progressive multiple sclerosis: Secondary | ICD-10-CM | POA: Diagnosis not present

## 2024-04-07 DIAGNOSIS — R269 Unspecified abnormalities of gait and mobility: Secondary | ICD-10-CM

## 2024-04-07 DIAGNOSIS — F413 Other mixed anxiety disorders: Secondary | ICD-10-CM | POA: Diagnosis not present

## 2024-04-07 DIAGNOSIS — G500 Trigeminal neuralgia: Secondary | ICD-10-CM | POA: Diagnosis not present

## 2024-04-07 NOTE — Progress Notes (Signed)
 "                                  [facility]  PATIENT: Crystal Huber DOB: 1945-06-22  REFERRING DOCTOR OR PCP:  [place]ia  Fulbright SOURCE: Patient  _________________________________   HISTORICAL  CHIEF COMPLAINT:  Chief Complaint  Patient presents with   Follow-up    Room 11 With caregiver    HISTORY OF PRESENT ILLNESS:  Crystal Huber is a 79 y.o. woman with relapsing remitting MS diagnosed in 2002.  Update  04/07/2024 She feels her MS is stable.  She has been off Rebif  since 2022.  She has had no exacerbations or significant new neurologic symptoms.   However, gait has slowly worsened.  She had one fall a couple months ago when she turned around in the kitchen. She felt dizzy before the fall.    She broke her right shoulder but did not hit head or lose consciousness.   Her shoulder has since healed (was in sling).  She has osteoporosis and she has started [employer].     She has noted a little numbness in her legs that come/goes  Her left trigeminal neuralgia has done very well since gamma knife.  No recent flares.  She is on Lamotrigine  150 mg po bid and gabapentin  300 mg po bid.   She knows she can increase to 3-4 a day if a flare occurs.   Before the gamma knofe, she had an [facility] [place] with  benefit x 1 month, last done about 1 years ago (Dr. Nancey).   Baclofen  caused sleepiness.   Carbamezapine caused hyponatremia.   She had gamma knife at [facility] in February 2024 (Dr. Nancye and Dr. Candyce) and she feels a lot of benefit.  Before the gamma knife she had a thin section MRI showing a T1 hypodensity near the left trigeminal entry zone.  Her gait is off balanced, She uses a cane more the past year.   On a good day, she can walk a few hundred feet maybe up to a quarter mile.    She denies much weakness or numbness.  She continues to note spells of vertigo..Bending over and back up may trigger a spell so she is careful.   She was doing exercises learned in  PT but has not done lately.  She is taking meclizine  when one occurs.   Her shower has grab bar and is a walk in with a shower chair.   Her aide helps her with this.  Her aide does twice a week for 6 hours.  She has mild cognitive impairment  She is not driving. .She feels stable.   Mood is doing well.  Notes feeling sad but no definite depression. She has some anxiety.   She feels less anxiety now that she has a roommate.  She sleeps well most nights.    She tries to eat well and exercise some.      Labs 03/03/2020 showed low Vit D (24) and she takes supplements.   TSH was fine.  B12 was normal.   LFT and Lymphocytes were fine   `     02/10/2023   11:10 AM 02/04/2022    1:37 PM 03/27/2020    2:03 PM  MMSE - Mini Mental State Exam  Orientation to time 4 5 3   Orientation to Place 5 5 5   Registration 3 3 3  Attention/ Calculation 5 1 2   Recall 3 3 3   Language- name 2 objects 2 2 2   Language- repeat 1 1 1   Language- follow 3 step command 2 3 3   Language- read & follow direction 1 1 1   Write a sentence 1 1 1   Copy design 1 1 1   Total score 28 26 25    02/04/22: She lost the 4 points due to serial sevens.  She would have scored 30/30 spelling world backwards.    MS History:   She had MRI and LP consistent with the diagnosis of MS in 2002 after presenting with trigeminal neuralgia.   She was started on Rebif .   She is on Rebif  22 mcg, tolerates it well and has had no definite exacerbation since.       MRI of the brain 09/19/2016 shows classic MS lesions.  There were a few foci not present in 2012.  MRI of the brain 12/21/2020 showed no new lesions.   REVIEW OF SYSTEMS: Constitutional: No fevers, chills, sweats, or change in appetite.   Fatigue, worse as the day goes on. Eyes: No visual changes, double vision, eye pain Ear, nose and throat: Has vertigo, No hearing loss, ear pain, nasal congestion, sore throat Cardiovascular: No chest pain, palpitations Respiratory:  No shortness of  breath at rest or with exertion.   No wheezes GastrointestinaI: No nausea, vomiting, diarrhea, abdominal pain, fecal incontinence Genitourinary:  No dysuria, urinary retention or frequency.  No nocturia. Musculoskeletal:  No neck pain, back pain Integumentary: No rash, pruritus, skin lesions Neurological: as above Psychiatric: Some depression and anxiety. Endocrine: No palpitations, diaphoresis, change in appetite, change in weigh or increased thirst Hematologic/Lymphatic:  No anemia, purpura, petechiae..   Bruises easily Allergic/Immunologic: No itchy/runny eyes, nasal congestion, recent allergic reactions, rashes  ALLERGIES: Allergies  Allergen Reactions   Carbamazepine      Decreased sodium level   Imipramine      Night sweats, more intense dreams   Metronidazole Nausea Only   Oxcarbazepine     Decreased sodium level   Penicillins Swelling    Yeast infection   Red Blood Cells Other (See Comments)    Jehovah's witness, does not want blood products   Statins     Gets hyponatremia and severe muscle pain   Sulfa Antibiotics Swelling    yeast infection    HOME MEDICATIONS:  Current Outpatient Medications:    aspirin  EC 81 MG tablet, Take 1 tablet (81 mg total) by mouth daily. Swallow whole., Disp: 30 tablet, Rfl: 12   Biotin w/ Vitamins C & E (HAIR/SKIN/NAILS PO), Take 1 tablet by mouth daily., Disp: , Rfl:    Calcium Carb-Cholecalciferol (CALCIUM 500 + D PO), Take by mouth., Disp: , Rfl:    calcium carbonate (TUMS SMOOTHIES) 750 MG chewable tablet, Chew 1,500 mg by mouth daily., Disp: , Rfl:    ezetimibe  (ZETIA ) 10 MG tablet, TAKE 1 TABLET(10 MG) BY MOUTH DAILY, Disp: 90 tablet, Rfl: 3   gabapentin  (NEURONTIN ) 300 MG capsule, Take 1 capsule (300 mg total) by mouth 3 (three) times daily., Disp: 270 capsule, Rfl: 2   hydrALAZINE  (APRESOLINE ) 50 MG tablet, TAKE 1 TABLET(50 MG) BY MOUTH TWICE DAILY, Disp: 180 tablet, Rfl: 0   lamoTRIgine  (LAMICTAL ) 150 MG tablet, Take 1 tablet in  the morning, 1 tablet in the afternoon and 2 tablets in the evening., Disp: 360 tablet, Rfl: 1   latanoprost  (XALATAN ) 0.005 % ophthalmic solution, INSTILL 2 DROPS IN BOTH EYES AT BEDTIME, Disp:  2.5 mL, Rfl: 0   meclizine  (ANTIVERT ) 25 MG tablet, Take 1 tablet (25 mg total) by mouth daily as needed for dizziness., Disp: 30 tablet, Rfl: 5   Multiple Vitamin (MULTIVITAMIN) tablet, Take 1 tablet by mouth daily., Disp: , Rfl:    potassium chloride  (KLOR-CON ) 10 MEQ tablet, Take 1 tablet (10 mEq total) by mouth daily., Disp: 10 tablet, Rfl: 0   Salicylic Acid (GNP CORN REMOVERS) 40 % PADS, 1 each by Other route as needed., Disp: , Rfl:    sertraline  (ZOLOFT ) 25 MG tablet, TAKE 1 TABLET(25 MG) BY MOUTH DAILY, Disp: 90 tablet, Rfl: 0   Suzetrigine 50 MG TABS, Take 50 mg by mouth 2 (two) times daily., Disp: , Rfl:    timolol  (TIMOPTIC ) 0.5 % ophthalmic solution, Place 1 drop into both eyes 2 (two) times daily., Disp: , Rfl:   PAST MEDICAL HISTORY: Past Medical History:  Diagnosis Date   Arthritis    right pinkie   Cancer (HCC)    right BR  CA    Cataract    right eye   GERD (gastroesophageal reflux disease)    prn tums, mild not often   Glaucoma    Hyperlipidemia    Hypertension    Macrocytic anemia 09/12/2013   Multiple sclerosis    Neuromuscular disorder (HCC)    trigeminal neuralgia   Refusal of blood product    Trigeminal neuralgia of left side of face     PAST SURGICAL HISTORY: Past Surgical History:  Procedure Laterality Date   BREAST LUMPECTOMY Right    wears prosthesis   CATARACT EXTRACTION Left 05/2022   COLONOSCOPY  12/20/2010   ganglion cyst removal Bilateral    NERVE SURGERY  05/2022   PARTIAL HYSTERECTOMY     POLYPECTOMY      FAMILY HISTORY: Family History  Problem Relation Age of Onset   Congestive Heart Failure Mother    Heart disease Father    Heart attack Father    Stroke Sister    Breast cancer Sister    Heart disease Sister    Heart disease Brother     Heart disease Brother    Heart disease Brother    Colon cancer Neg Hx    Colon polyps Neg Hx    Esophageal cancer Neg Hx    Rectal cancer Neg Hx    Stomach cancer Neg Hx     SOCIAL HISTORY:  Social History   Socioeconomic History   Marital status: Widowed    Spouse name: Not on file   Number of children: Not on file   Years of education: Not on file   Highest education level: Not on file  Occupational History   Not on file  Tobacco Use   Smoking status: Former    Types: Cigarettes   Smokeless tobacco: Never   Tobacco comments:    Quit smoking at age 45 and only smoke occasional x 1.5 years   Vaping Use   Vaping status: Never Used  Substance and Sexual Activity   Alcohol  use: Not Currently    Comment: 1 glass of wine a night   Drug use: No   Sexual activity: Not on file  Other Topics Concern   Not on file  Social History Narrative   Diet      Do you drink/eat things with caffeine      Marital Status     What year were you married?      Do you live in a  house, apartment, assisted living, condo, trailer, etc.?      Is it one or more stories?      How many persons live in your home?         Do you have any pets in your home?(please list)      Highest level of education completed:      Current or past profession:      Do you exercise?:    Type and how often:      Do you have a Living Will? (Form that indicates scenarios where you would not want your life prolonged)      Do you have a DNR form?         If not, would you like to discuss one?      Do you have signed POA/HPOA forms? Yes      Do you have difficulty bathing or dressing yourself?      Do you have difficulty preparing food or eating?      Do you have difficulty managing medications?      Do you have difficulty managing your finances?      Do you have difficulty affording your medications?                     Social Drivers of Health   Tobacco Use: Medium Risk (04/07/2024)   Patient  History    Smoking Tobacco Use: Former    Smokeless Tobacco Use: Never    Passive Exposure: Not on file  Financial Resource Strain: Not on file  Food Insecurity: Low Risk (08/04/2023)   Received from [facility]   Epic    Within the past 12 months, you worried that your food would run out before you got money to buy more: Never true    Within the past 12 months, the food you bought just didn't last and you didn't have money to get more. : Never true  Transportation Needs: No Transportation Needs (08/04/2023)   Received from [employer]    In the past 12 months, has lack of reliable transportation kept you from medical appointments, meetings, work or from getting things needed for daily living? : No  Physical Activity: Not on file  Stress: Not on file  Social Connections: Not on file  Intimate Partner Violence: Not At Risk (01/16/2022)   Humiliation, Afraid, Rape, and Kick questionnaire    Fear of Current or Ex-Partner: No    Emotionally Abused: No    Physically Abused: No    Sexually Abused: No  Depression (PHQ2-9): Low Risk (03/15/2024)   Depression (PHQ2-9)    PHQ-2 Score: 0  Alcohol  Screen: Not on file  Housing: Low Risk (08/04/2023)   Received from [facility]   Epic    What is your living situation today?: I have a steady place to live    Think about the place you live. Do you have problems with any of the following? Choose all that apply:: None/None on this list  Utilities: Low Risk (08/04/2023)   Received from [facility]   Utilities    In the past 12 months has the electric, gas, oil, or water company threatened to shut off services in your home? : No  Health Literacy: Not on file     PHYSICAL EXAM  Vitals:   04/07/24 1147  BP: 137/79  Pulse: 64  SpO2: 95%  Weight: 127 lb (57.6 kg)  Height: 5' 3 (1.6 m)  Body mass index is 22.5 kg/m.    General: The patient is well-developed and well-nourished and in no acute distress  Neck:   The neck has good range of motion.  Neurologic Exam  Mental status: The patient is alert and oriented x 3 at the time of the examination. The patient has apparent normal recent and remote memory, with an apparently normal attention span and concentration ability.   Speech is normal.  Cranial nerves: Extraocular movements are full.  There is no nystagmus.  Facial strength and sensation was normal.  She has reduced left-sided hearing.. The Weber does not lateralize.   Motor: Tremor not notable. Muscle bulk is normal.    normal muscle tone in the arms strength is  5 / 5 in all 4 extremities.   Sensory: She has normal symmetric sensation to touch and vibration in her feet and hands - no evidence of polyneuropathy.  Coordination: Cerebellar testing shows good finger nose finger and heel to shin.   Gait and station: Station is normal.  The gait is mildly wide and she takes 4 steps to turn but can do so without support.  .  Stride is better with cane.  This is stable or even slightly better compared to the last couple of visits tandem is poor,   Romberg was negative.  Reflexes: Deep tendon reflexes are symmetric and normal bilaterally.        ASSESSMENT AND PLAN  Active secondary progressive multiple sclerosis  Trigeminal neuralgia  Gait disorder  Other mixed anxiety disorders   1.   Her MS has been mostly stable off the Rebif .  Changes over the last few years are more consistent with mild progression from her active secondary progressive MS rather than exacerbation.  MRI of the brain last year showed no new lesions.   2.   TN pain is not causing any pain now but it does seem to flareup periodically.  Generally she has done well since the procedure.   She sometimes has spells of confusion so I would like to keep the doses as low as effective.  Continue lamotrigine  to 150 mg po bid and gabapentin  300 mg twice daily (can go up to 4/day if flare)  for Trigeminal neuralgia.   If she has a flare  she can take a steroid pack and we can increase the lamotrigine  to 3 times a day and the gabapentin  to 4 times a day.  Vimpat  did not add a lot of benefit.   TN likely due to the MS as no definite vascular loop seen on MRI.     3.   Although she has noted some mild memory loss, cognition is stable.    Advised not to drive.  She has an engineer, production.  She should only take showers when the aide is around to help 4.   Continue to be active.  Gait is stable.   Continue vitamin D supplements  Rtc 6 months, sooner if problems     This visit is part of a comprehensive longitudinal care medical relationship regarding the patients primary diagnosis of multiple sclerosis and related concerns.   Brandom Kerwin A. Vear, MD, PhD 04/07/2024, 12:06 PM Certified in Neurology, Clinical Neurophysiology, Sleep Medicine, Pain Medicine and Neuroimaging  [facility] [address] [place], [place] [address] [phone]  "

## 2024-04-22 ENCOUNTER — Telehealth: Payer: Self-pay

## 2024-04-22 DIAGNOSIS — L814 Other melanin hyperpigmentation: Secondary | ICD-10-CM

## 2024-04-22 NOTE — Telephone Encounter (Signed)
 Patient states she would like to have sun spots checked on legs and face

## 2024-04-22 NOTE — Telephone Encounter (Signed)
 What does she follow with Dermatologist for? Needing a diagnosis to link referral

## 2024-04-22 NOTE — Telephone Encounter (Signed)
 Copied from CRM [id number]. Topic: Referral - Request for Referral >> Apr 22, 2024  1:22 PM Marda MATSU wrote: Dr Norleen Hurst, Dermatologist.  [place]  [phone] patient  Mancusi states she has an annual appointment with Dr Hurst but due to policy changes with insurance a referral is necessary.    Please advise.

## 2024-04-23 NOTE — Telephone Encounter (Signed)
 Referral placed

## 2024-04-23 NOTE — Telephone Encounter (Signed)
 Left a detail voicemail for the patient to notify her that the referral has been placed.  [id number] it is okay to share the response of the provider with the patient. If patient has any questions or concerns please have the patient to call the office at [phone]

## 2024-07-23 ENCOUNTER — Ambulatory Visit: Admitting: Nurse Practitioner

## 2024-12-13 ENCOUNTER — Ambulatory Visit: Admitting: Neurology
